# Patient Record
Sex: Female | Born: 1955 | Race: White | Hispanic: No | Marital: Married | Smoking: Never smoker
Health system: Southern US, Community
[De-identification: ages and names within clinical notes are randomized; demographics above are authoritative.]

## PROBLEM LIST (undated history)

## (undated) DIAGNOSIS — M1990 Unspecified osteoarthritis, unspecified site: Secondary | ICD-10-CM

## (undated) DIAGNOSIS — I447 Left bundle-branch block, unspecified: Secondary | ICD-10-CM

## (undated) DIAGNOSIS — R011 Cardiac murmur, unspecified: Secondary | ICD-10-CM

## (undated) DIAGNOSIS — E119 Type 2 diabetes mellitus without complications: Secondary | ICD-10-CM

## (undated) DIAGNOSIS — I10 Essential (primary) hypertension: Secondary | ICD-10-CM

## (undated) DIAGNOSIS — E785 Hyperlipidemia, unspecified: Secondary | ICD-10-CM

## (undated) DIAGNOSIS — I519 Heart disease, unspecified: Secondary | ICD-10-CM

## (undated) HISTORY — DX: Type 2 diabetes mellitus without complications: E11.9

## (undated) HISTORY — PX: MIDDLE EAR SURGERY: SHX713

## (undated) HISTORY — DX: Hyperlipidemia, unspecified: E78.5

## (undated) HISTORY — DX: Essential (primary) hypertension: I10

## (undated) HISTORY — DX: Heart disease, unspecified: I51.9

## (undated) HISTORY — DX: Unspecified osteoarthritis, unspecified site: M19.90

## (undated) HISTORY — DX: Cardiac murmur, unspecified: R01.1

---
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
## 1998-12-01 ENCOUNTER — Other Ambulatory Visit: Admission: RE | Admit: 1998-12-01 | Discharge: 1998-12-01 | Payer: Self-pay | Admitting: Gynecology

## 2000-11-12 ENCOUNTER — Other Ambulatory Visit: Admission: RE | Admit: 2000-11-12 | Discharge: 2000-11-12 | Payer: Self-pay | Admitting: Gynecology

## 2004-06-21 ENCOUNTER — Other Ambulatory Visit: Admission: RE | Admit: 2004-06-21 | Discharge: 2004-06-21 | Payer: Self-pay | Admitting: Gynecology

## 2005-08-11 ENCOUNTER — Inpatient Hospital Stay (HOSPITAL_COMMUNITY): Admission: EM | Admit: 2005-08-11 | Discharge: 2005-08-15 | Payer: Self-pay | Admitting: Emergency Medicine

## 2005-08-25 ENCOUNTER — Emergency Department (HOSPITAL_COMMUNITY): Admission: EM | Admit: 2005-08-25 | Discharge: 2005-08-25 | Payer: Self-pay | Admitting: Emergency Medicine

## 2008-10-13 ENCOUNTER — Inpatient Hospital Stay (HOSPITAL_COMMUNITY): Admission: EM | Admit: 2008-10-13 | Discharge: 2008-10-14 | Payer: Self-pay | Admitting: Emergency Medicine

## 2008-10-28 ENCOUNTER — Encounter: Payer: Self-pay | Admitting: Cardiovascular Disease

## 2009-08-24 ENCOUNTER — Inpatient Hospital Stay (HOSPITAL_COMMUNITY): Admission: EM | Admit: 2009-08-24 | Discharge: 2009-08-26 | Payer: Self-pay | Admitting: Emergency Medicine

## 2009-09-13 ENCOUNTER — Ambulatory Visit (HOSPITAL_COMMUNITY): Admission: RE | Admit: 2009-09-13 | Discharge: 2009-09-13 | Payer: Self-pay | Admitting: Internal Medicine

## 2009-09-13 ENCOUNTER — Ambulatory Visit: Payer: Self-pay | Admitting: Family Medicine

## 2009-11-27 DIAGNOSIS — I447 Left bundle-branch block, unspecified: Secondary | ICD-10-CM

## 2009-11-27 DIAGNOSIS — R011 Cardiac murmur, unspecified: Secondary | ICD-10-CM

## 2009-11-27 HISTORY — DX: Left bundle-branch block, unspecified: I44.7

## 2009-11-27 HISTORY — DX: Cardiac murmur, unspecified: R01.1

## 2010-05-11 ENCOUNTER — Inpatient Hospital Stay (HOSPITAL_COMMUNITY): Admission: EM | Admit: 2010-05-11 | Discharge: 2010-05-14 | Payer: Self-pay | Admitting: Emergency Medicine

## 2010-05-27 ENCOUNTER — Ambulatory Visit: Payer: Self-pay | Admitting: Family Medicine

## 2010-05-31 ENCOUNTER — Ambulatory Visit (HOSPITAL_COMMUNITY): Admission: RE | Admit: 2010-05-31 | Discharge: 2010-05-31 | Payer: Self-pay | Admitting: Internal Medicine

## 2010-06-02 ENCOUNTER — Encounter (INDEPENDENT_AMBULATORY_CARE_PROVIDER_SITE_OTHER): Payer: Self-pay | Admitting: Adult Health

## 2010-06-02 ENCOUNTER — Ambulatory Visit: Payer: Self-pay | Admitting: Internal Medicine

## 2010-06-02 LAB — CONVERTED CEMR LAB
ALT: 46 U/L — ABNORMAL HIGH
AST: 25 U/L
Albumin: 4.3 g/dL
Alkaline Phosphatase: 54 U/L
BUN: 24 mg/dL — ABNORMAL HIGH
Basophils Absolute: 0 K/uL
Basophils Relative: 0 %
CO2: 25 meq/L
Calcium: 9.7 mg/dL
Chloride: 100 meq/L
Cholesterol: 137 mg/dL
Creatinine, Ser: 0.86 mg/dL
Eosinophils Absolute: 0.3 K/uL
Eosinophils Relative: 3 %
Glucose, Bld: 186 mg/dL — ABNORMAL HIGH
HCT: 43.2 %
HDL: 54 mg/dL
Hemoglobin: 13.8 g/dL
LDL Cholesterol: 47 mg/dL
Lymphocytes Relative: 26 %
Lymphs Abs: 2.2 K/uL
MCHC: 31.9 g/dL
MCV: 89.4 fL
Microalb, Ur: 6.45 mg/dL — ABNORMAL HIGH
Monocytes Absolute: 0.6 K/uL
Monocytes Relative: 7 %
Neutro Abs: 5.5 K/uL
Neutrophils Relative %: 65 %
Platelets: 258 K/uL
Potassium: 4.6 meq/L
RBC: 4.83 M/uL
RDW: 15.2 %
Sodium: 141 meq/L
TSH: 2.054 u[IU]/mL
Total Bilirubin: 0.6 mg/dL
Total CHOL/HDL Ratio: 2.5
Total Protein: 7.2 g/dL
Triglycerides: 182 mg/dL — ABNORMAL HIGH
VLDL: 36 mg/dL
Vit D, 25-Hydroxy: 40 ng/mL
WBC: 8.5 10*3/microliter

## 2010-06-10 ENCOUNTER — Ambulatory Visit (HOSPITAL_COMMUNITY): Admission: RE | Admit: 2010-06-10 | Discharge: 2010-06-10 | Payer: Self-pay | Admitting: Internal Medicine

## 2010-06-13 ENCOUNTER — Ambulatory Visit: Payer: Self-pay | Admitting: Internal Medicine

## 2010-06-13 LAB — CONVERTED CEMR LAB: Pap Smear: NEGATIVE

## 2010-06-15 ENCOUNTER — Ambulatory Visit (HOSPITAL_COMMUNITY): Admission: RE | Admit: 2010-06-15 | Discharge: 2010-06-15 | Payer: Self-pay | Admitting: Internal Medicine

## 2010-06-23 ENCOUNTER — Ambulatory Visit (HOSPITAL_COMMUNITY): Admission: RE | Admit: 2010-06-23 | Discharge: 2010-06-23 | Payer: Self-pay | Admitting: Cardiology

## 2010-06-23 ENCOUNTER — Encounter (INDEPENDENT_AMBULATORY_CARE_PROVIDER_SITE_OTHER): Payer: Self-pay | Admitting: Cardiology

## 2010-06-28 ENCOUNTER — Ambulatory Visit: Payer: Self-pay | Admitting: Internal Medicine

## 2010-06-29 ENCOUNTER — Ambulatory Visit: Payer: Self-pay | Admitting: Internal Medicine

## 2010-08-04 ENCOUNTER — Other Ambulatory Visit: Admission: RE | Admit: 2010-08-04 | Discharge: 2010-08-04 | Payer: Self-pay | Admitting: Obstetrics & Gynecology

## 2010-08-04 ENCOUNTER — Ambulatory Visit: Payer: Self-pay | Admitting: Obstetrics & Gynecology

## 2010-08-18 ENCOUNTER — Ambulatory Visit: Payer: Self-pay | Admitting: Obstetrics & Gynecology

## 2010-10-31 ENCOUNTER — Encounter (HOSPITAL_COMMUNITY)
Admission: RE | Admit: 2010-10-31 | Discharge: 2010-12-27 | Payer: Self-pay | Source: Home / Self Care | Attending: Cardiology | Admitting: Cardiology

## 2010-12-17 ENCOUNTER — Encounter: Payer: Self-pay | Admitting: Cardiology

## 2010-12-18 ENCOUNTER — Encounter: Payer: Self-pay | Admitting: Cardiology

## 2011-02-12 LAB — GLUCOSE, CAPILLARY
Glucose-Capillary: 163 mg/dL — ABNORMAL HIGH (ref 70–99)
Glucose-Capillary: 177 mg/dL — ABNORMAL HIGH (ref 70–99)
Glucose-Capillary: 196 mg/dL — ABNORMAL HIGH (ref 70–99)
Glucose-Capillary: 203 mg/dL — ABNORMAL HIGH (ref 70–99)
Glucose-Capillary: 213 mg/dL — ABNORMAL HIGH (ref 70–99)
Glucose-Capillary: 213 mg/dL — ABNORMAL HIGH (ref 70–99)
Glucose-Capillary: 222 mg/dL — ABNORMAL HIGH (ref 70–99)
Glucose-Capillary: 226 mg/dL — ABNORMAL HIGH (ref 70–99)
Glucose-Capillary: 259 mg/dL — ABNORMAL HIGH (ref 70–99)
Glucose-Capillary: 277 mg/dL — ABNORMAL HIGH (ref 70–99)
Glucose-Capillary: 300 mg/dL — ABNORMAL HIGH (ref 70–99)
Glucose-Capillary: 306 mg/dL — ABNORMAL HIGH (ref 70–99)
Glucose-Capillary: 326 mg/dL — ABNORMAL HIGH (ref 70–99)

## 2011-02-12 LAB — BASIC METABOLIC PANEL
BUN: 17 mg/dL (ref 6–23)
CO2: 32 mEq/L (ref 19–32)
Calcium: 8.8 mg/dL (ref 8.4–10.5)
Chloride: 99 mEq/L (ref 96–112)
Creatinine, Ser: 0.75 mg/dL (ref 0.4–1.2)
GFR calc Af Amer: >60 mL/min (ref >60)
GFR calc non Af Amer: >60 mL/min (ref >60)
Glucose, Bld: 317 mg/dL — ABNORMAL HIGH (ref 70–99)
Potassium: 4.1 mEq/L (ref 3.5–5.1)
Sodium: 137 mEq/L (ref 135–145)

## 2011-02-12 LAB — HEMOGLOBIN A1C
Hgb A1c MFr Bld: 7.9 % — ABNORMAL HIGH (ref <5.7)
Mean Plasma Glucose: 180 mg/dL — ABNORMAL HIGH (ref <117)

## 2011-02-12 LAB — CARDIAC PANEL(CRET KIN+CKTOT+MB+TROPI)
CK, MB: 3.2 ng/mL (ref 0.3–4.0)
CK, MB: 3.5 ng/mL (ref 0.3–4.0)
Relative Index: 1 (ref 0.0–2.5)
Relative Index: 2 (ref 0.0–2.5)
Total CK: 171 U/L (ref 7–177)
Total CK: 336 U/L — ABNORMAL HIGH (ref 7–177)
Troponin I: 0.01 ng/mL (ref 0.00–0.06)
Troponin I: 0.01 ng/mL (ref 0.00–0.06)

## 2011-02-12 LAB — TROPONIN I: Troponin I: 0.01 ng/mL (ref 0.00–0.06)

## 2011-02-12 LAB — MAGNESIUM: Magnesium: 2.1 mg/dL (ref 1.5–2.5)

## 2011-02-12 LAB — CK TOTAL AND CKMB (NOT AT ARMC)
CK, MB: 2.5 ng/mL (ref 0.3–4.0)
Relative Index: 1.3 (ref 0.0–2.5)
Total CK: 193 U/L — ABNORMAL HIGH (ref 7–177)

## 2011-02-12 LAB — PHOSPHORUS: Phosphorus: 3.2 mg/dL (ref 2.3–4.6)

## 2011-02-13 LAB — POCT CARDIAC MARKERS
CKMB, poc: 1.2 ng/mL (ref 1.0–8.0)
CKMB, poc: 2.2 ng/mL (ref 1.0–8.0)
Myoglobin, poc: 134 ng/mL (ref 12–200)
Myoglobin, poc: 195 ng/mL (ref 12–200)
Troponin i, poc: <0.05 ng/mL (ref 0.00–0.09)
Troponin i, poc: <0.05 ng/mL (ref 0.00–0.09)

## 2011-02-13 LAB — CULTURE, BLOOD (ROUTINE X 2)
Culture: NO GROWTH
Culture: NO GROWTH

## 2011-02-13 LAB — PROTIME-INR
INR: 1.12 (ref 0.00–1.49)
Prothrombin Time: 14.3 seconds (ref 11.6–15.2)

## 2011-02-13 LAB — POCT I-STAT 3, ART BLOOD GAS (G3+)
Acid-Base Excess: 6 mmol/L — ABNORMAL HIGH (ref 0.0–2.0)
Bicarbonate: 31.7 mEq/L — ABNORMAL HIGH (ref 20.0–24.0)
O2 Saturation: 93 %
TCO2: 33 mmol/L (ref 0–100)
pCO2 arterial: 48.1 mmHg — ABNORMAL HIGH (ref 35.0–45.0)
pH, Arterial: 7.427 — ABNORMAL HIGH (ref 7.350–7.400)
pO2, Arterial: 65 mmHg — ABNORMAL LOW (ref 80.0–100.0)

## 2011-02-13 LAB — CBC
HCT: 40.8 % (ref 36.0–46.0)
Hemoglobin: 13.7 g/dL (ref 12.0–15.0)
MCHC: 33.5 g/dL (ref 30.0–36.0)
MCV: 88.2 fL (ref 78.0–100.0)
Platelets: 202 10*3/uL (ref 150–400)
RBC: 4.63 MIL/uL (ref 3.87–5.11)
RDW: 14.4 % (ref 11.5–15.5)
WBC: 8.3 10*3/uL (ref 4.0–10.5)

## 2011-02-13 LAB — DIFFERENTIAL
Basophils Absolute: 0 10*3/uL (ref 0.0–0.1)
Basophils Relative: 0 % (ref 0–1)
Eosinophils Absolute: 0.2 10*3/uL (ref 0.0–0.7)
Eosinophils Relative: 2 % (ref 0–5)
Lymphocytes Relative: 20 % (ref 12–46)
Lymphs Abs: 1.7 10*3/uL (ref 0.7–4.0)
Monocytes Absolute: 0.9 10*3/uL (ref 0.1–1.0)
Monocytes Relative: 11 % (ref 3–12)
Neutro Abs: 5.5 10*3/uL (ref 1.7–7.7)
Neutrophils Relative %: 67 % (ref 43–77)

## 2011-02-13 LAB — D-DIMER, QUANTITATIVE: D-Dimer, Quant: 0.34 ug/mL-FEU (ref 0.00–0.48)

## 2011-02-13 LAB — APTT: aPTT: 27 seconds (ref 24–37)

## 2011-02-13 LAB — BRAIN NATRIURETIC PEPTIDE: Pro B Natriuretic peptide (BNP): 63 pg/mL (ref 0.0–100.0)

## 2011-03-03 LAB — CULTURE, BLOOD (ROUTINE X 2)
Culture: NO GROWTH
Culture: NO GROWTH

## 2011-03-03 LAB — COMPREHENSIVE METABOLIC PANEL
ALT: 26 U/L (ref 0–35)
ALT: 33 U/L (ref 0–35)
AST: 18 U/L (ref 0–37)
AST: 19 U/L (ref 0–37)
Albumin: 3 g/dL — ABNORMAL LOW (ref 3.5–5.2)
Albumin: 3.5 g/dL (ref 3.5–5.2)
Alkaline Phosphatase: 65 U/L (ref 39–117)
Alkaline Phosphatase: 72 U/L (ref 39–117)
BUN: 11 mg/dL (ref 6–23)
BUN: 9 mg/dL (ref 6–23)
CO2: 29 mEq/L (ref 19–32)
CO2: 32 mEq/L (ref 19–32)
Calcium: 8.2 mg/dL — ABNORMAL LOW (ref 8.4–10.5)
Calcium: 9.2 mg/dL (ref 8.4–10.5)
Chloride: 97 mEq/L (ref 96–112)
Chloride: 98 mEq/L (ref 96–112)
Creatinine, Ser: 0.73 mg/dL (ref 0.4–1.2)
Creatinine, Ser: 0.81 mg/dL (ref 0.4–1.2)
GFR calc Af Amer: >60 mL/min (ref >60)
GFR calc Af Amer: >60 mL/min (ref >60)
GFR calc non Af Amer: >60 mL/min (ref >60)
GFR calc non Af Amer: >60 mL/min (ref >60)
Glucose, Bld: 129 mg/dL — ABNORMAL HIGH (ref 70–99)
Glucose, Bld: 131 mg/dL — ABNORMAL HIGH (ref 70–99)
Potassium: 3.5 mEq/L (ref 3.5–5.1)
Potassium: 4.1 mEq/L (ref 3.5–5.1)
Sodium: 134 mEq/L — ABNORMAL LOW (ref 135–145)
Sodium: 138 mEq/L (ref 135–145)
Total Bilirubin: 0.5 mg/dL (ref 0.3–1.2)
Total Bilirubin: 0.5 mg/dL (ref 0.3–1.2)
Total Protein: 6.2 g/dL (ref 6.0–8.3)
Total Protein: 7.3 g/dL (ref 6.0–8.3)

## 2011-03-03 LAB — CBC
HCT: 36.6 % (ref 36.0–46.0)
HCT: 39.8 % (ref 36.0–46.0)
Hemoglobin: 12.4 g/dL (ref 12.0–15.0)
Hemoglobin: 13.2 g/dL (ref 12.0–15.0)
MCHC: 33.2 g/dL (ref 30.0–36.0)
MCHC: 33.9 g/dL (ref 30.0–36.0)
MCV: 89.3 fL (ref 78.0–100.0)
MCV: 89.6 fL (ref 78.0–100.0)
Platelets: 216 10*3/uL (ref 150–400)
Platelets: 236 10*3/uL (ref 150–400)
RBC: 4.09 MIL/uL (ref 3.87–5.11)
RBC: 4.45 MIL/uL (ref 3.87–5.11)
RDW: 14 % (ref 11.5–15.5)
RDW: 14.3 % (ref 11.5–15.5)
WBC: 8 10*3/uL (ref 4.0–10.5)
WBC: 8.3 10*3/uL (ref 4.0–10.5)

## 2011-03-03 LAB — GLUCOSE, CAPILLARY
Glucose-Capillary: 107 mg/dL — ABNORMAL HIGH (ref 70–99)
Glucose-Capillary: 123 mg/dL — ABNORMAL HIGH (ref 70–99)
Glucose-Capillary: 124 mg/dL — ABNORMAL HIGH (ref 70–99)
Glucose-Capillary: 129 mg/dL — ABNORMAL HIGH (ref 70–99)
Glucose-Capillary: 142 mg/dL — ABNORMAL HIGH (ref 70–99)
Glucose-Capillary: 94 mg/dL (ref 70–99)

## 2011-03-03 LAB — URINE CULTURE: Colony Count: 50000

## 2011-03-03 LAB — URINALYSIS, ROUTINE W REFLEX MICROSCOPIC
Bilirubin Urine: NEGATIVE
Bilirubin Urine: NEGATIVE
Glucose, UA: NEGATIVE mg/dL
Glucose, UA: NEGATIVE mg/dL
Hgb urine dipstick: NEGATIVE
Hgb urine dipstick: NEGATIVE
Ketones, ur: NEGATIVE mg/dL
Ketones, ur: NEGATIVE mg/dL
Nitrite: NEGATIVE
Nitrite: NEGATIVE
Protein, ur: NEGATIVE mg/dL
Protein, ur: NEGATIVE mg/dL
Specific Gravity, Urine: 1.017 (ref 1.005–1.030)
Specific Gravity, Urine: 1.023 (ref 1.005–1.030)
Urobilinogen, UA: 0.2 mg/dL (ref 0.0–1.0)
Urobilinogen, UA: 0.2 mg/dL (ref 0.0–1.0)
pH: 6 (ref 5.0–8.0)
pH: 8 (ref 5.0–8.0)

## 2011-03-03 LAB — DIFFERENTIAL
Basophils Absolute: 0 10*3/uL (ref 0.0–0.1)
Basophils Relative: 1 % (ref 0–1)
Eosinophils Absolute: 0.3 10*3/uL (ref 0.0–0.7)
Eosinophils Relative: 4 % (ref 0–5)
Lymphocytes Relative: 33 % (ref 12–46)
Lymphs Abs: 2.7 10*3/uL (ref 0.7–4.0)
Monocytes Absolute: 0.6 10*3/uL (ref 0.1–1.0)
Monocytes Relative: 8 % (ref 3–12)
Neutro Abs: 4.6 10*3/uL (ref 1.7–7.7)
Neutrophils Relative %: 56 % (ref 43–77)

## 2011-03-03 LAB — URINE MICROSCOPIC-ADD ON

## 2011-03-03 LAB — LIPASE, BLOOD: Lipase: 20 U/L (ref 11–59)

## 2011-04-11 NOTE — Cardiovascular Report (Signed)
Samantha [name], [name]          ACCOUNT NO.:  [id number]   MEDICAL RECORD NO.:  [id number]          PATIENT TYPE:  INP   LOCATION:  4705                         FACILITY:  [facility]   PHYSICIAN:  Vesta Mixer, M.D. DATE OF BIRTH:  [birth year]   DATE OF PROCEDURE:  10/13/2008  DATE OF DISCHARGE:                            CARDIAC CATHETERIZATION   Samantha [name] is a 55 year old female with a history of  hypertension, hyperlipidemia, and coronary thrombosis.  She is admitted  with a chest pain.  Because of her persistent chest pain and mild EKG  changes, we brought her up to the cath lab.  The procedure was left  heart catheterization with coronary angiography.   HEMODYNAMICS:  LV pressure is 129/16 with an aortic pressure of 123/67.   ANGIOGRAPHY:  Left main:  The left main is fairly smooth and normal.   The left anterior descending artery is smooth and normal.  The diagonal  arteries are normal.   The left circumflex artery is large and dominant.  It is smooth and  normal throughout its course.  There is no evidence of thrombus as it  was 2 years ago.   The posterior descending artery is normal as are the obtuse marginal  arteries.   The right coronary artery is small and is nondominant.  It is smooth and  normal throughout its course.   The left ventriculogram was performed in a 30 RAO position.  It reveals  normal left ventricular systolic function.  The ejection fraction is  65%.   COMPLICATIONS:  None.   CONCLUSION:  Smooth and normal coronary arteries.  We will admit her for  observation and anticipate discharge tomorrow.      Vesta Mixer, M.D.  Electronically Signed     PJN/MEDQ  D:  10/13/2008  T:  10/14/2008  Job:  [id number]   cc:   Leonette Most Record

## 2011-04-11 NOTE — Discharge Summary (Signed)
NAMESHALITA, Samantha [name]          ACCOUNT NO.:  [id number]   MEDICAL RECORD NO.:  [id number]          PATIENT TYPE:  INP   LOCATION:  4705                         FACILITY:  [facility]   PHYSICIAN:  Vesta Mixer, M.D. DATE OF BIRTH:  [birth year]   DATE OF ADMISSION:  10/13/2008  DATE OF DISCHARGE:  10/14/2008                               DISCHARGE SUMMARY   DISCHARGE DIAGNOSES:  1. Noncardiac chest pain.  2. Hypertension.  3. Hyperlipidemia.  4. Diabetes mellitus.  5. Anxiety.   DISCHARGE MEDICATIONS:  1. Nitroglycerin 0.4 mg sublingually as needed.  2. Verapamil 240 mg slow release once a day.  3. Aspirin 81 mg a day.  4. Diclofenac 75 mg twice a day.  5. HCTZ 12.5 mg a day.  6. Januvia 100 mg a day.  7. Fluoxetine 20 mg a day.  8. Lisinopril 10 mg a day.  9. Lovastatin 20 mg a day.  10.Calcium.  11.Vitamin with vitamin D once a day.   DISPOSITION:  The patient will see Dr. Elease Hashimoto in 1 month.  She is to see  Dr. record as needed.  We will encourage her to take her diclofenac  twice a day as a scheduled dose for the next week or so.   History is coccal is a 55 year old female who is admitted through the  emergency room with episode of chest pain.  Please see H and P for  further details.   HOSPITAL COURSE:  1. Chest pain.  The patient ruled out for myocardial infarction.  She      did have mildly elevated CPK levels with very minimal CPK-MB.  Her      troponin, however, remained completely normal.  I suspect that this      was due to musculoskeletal call us.  She this chest pain was      associated with nausea and indigestion.  We took urgently to the cath lab and she was found to have smooth and  normal coronary arteries.  She had normal left ventricular systolic  function.  She will be discharged on the above-noted medications and  disposition.  She is to call us for any further episodes of pain.  We  have added nitroglycerin to a medical assistant to cover for  coronary  spasm or esophageal spasm.  1. Diabetes mellitus - stable.  Her glucose levels were low elevated.      This will be followed by Dr. Record.  2. Hypertension - stable.  3. Hyperlipidemia - stable.  We will check her lipids as an      outpatient.      Vesta Mixer, M.D.  Electronically Signed     PJN/MEDQ  D:  10/14/2008  T:  10/14/2008  Job:  [id number]   cc:   Leonette Most Record

## 2011-04-14 NOTE — H&P (Signed)
Samantha [name], [name]          ACCOUNT NO.:  [id number]   MEDICAL RECORD NO.:  [id number]          PATIENT TYPE:  INP   LOCATION:  2902                         FACILITY:  [facility]   PHYSICIAN:  Vesta Mixer, M.D. DATE OF BIRTH:  07-30-1956   DATE OF ADMISSION:  08/11/2005  DATE OF DISCHARGE:                                HISTORY & PHYSICAL   HISTORY OF PRESENT ILLNESS:  [name] is a 55 year old female with a  history of hypertension.  She was admitted to the hospital with episodes of  chest pain, shortness of breath, and positive cardiac enzymes.   [name] has a long history of hypertension.  She has overall done  fairly well.  This morning, she awoke with severe chest discomfort and  dyspnea.  The pain was described as a heaviness and gradually lead to  increased dyspnea.  She also had some pain in her left hand.  The symptoms  did not subside and she presented to the emergency room for further  evaluation.   Now, her symptoms are better now that she is on nitroglycerin and heparin.  She denies any previous episode of cardiac problems.  She is admitted for  further management.   CURRENT MEDICATIONS:  1.  Aspirin one daily.  2.  Lisinopril 10 mg daily.  3.  Verapamil SA 240 mg daily.  4.  HCTZ 12.5 mg daily.  5.  Effexor XR 75 mg daily.   ALLERGIES:  No known drug allergies.   PAST MEDICAL HISTORY:  1.  Hypertension.  2.  Depression.   SOCIAL HISTORY:  The patient is a nonsmoker and a nondrinker.  She works at  [employer].   FAMILY HISTORY:  Her mother died of congestive heart failure and her brother  died of congestive heart failure.   REVIEW OF SYSTEMS:  Reviewed and is essentially negative.   PHYSICAL EXAMINATION:  GENERAL:  She is a middle-aged female in no acute  distress.  She is alert and oriented x3, and her mood and affect are normal.  VITAL SIGNS:  Her blood pressure is 149/66.  Temperature is 99.1, her heart  rate is 70.  HEENT:  2+  carotid's.  She has no JVD, no thyromegaly.  LUNGS:  Bilateral tight wheezing.  HEART:  Regular rate, S1 and S2.  I did not hear any murmurs.  ABDOMEN:  Good bowel sounds and is nontender.  EXTREMITIES:  She has no edema.  NEUROLOGIC:  Nonfocal.  Cranial nerves II-XII intact.   LABORATORY DATA:  Her chest x-ray reveals mild cardiomegaly.  There is no  pulmonary edema.  Her BNP is 51.  Her white blood cell count is 12.3,  hemoglobin 13.8.  Her Chem-7 is within normal limits except for a potassium  of 3.6.   Her CPK-MB initially was 10.7.  It has come down slightly to 6.6.  Her  troponin level is less than 0.05.   Her EKG could not be located at the time of dictation.  From the emergency  room, it reportedly revealed no acute changes.  We will have them repeat the  EKG.  IMPRESSION AND PLAN:  1.  Chest pain and dyspnea.  The patient's symptoms are certainly concerning      for angina.  She has positive CPK-MB's.  Her troponin's remain negative.      We will continue with nitroglycerin and heparin.  We will repeat the EKG      if we cannot find it tonight and we will get another one tomorrow.  She      may need a heart catheterization on Monday if her enzymes are worrisome.  2.  She definitely has wheezing.  It is possible that all of this could be      due to bronchospasm and an episode of angina.  She does not have any      congestive heart failure by chest x-ray.  We will give her Xopenex as      well as Solu-Medrol.  3.  Hypertension.  Stable.           ______________________________  Vesta Mixer, M.D.     PJN/MEDQ  D:  08/11/2005  T:  08/12/2005  Job:  [id number]   cc:   Leonette Most Record  [address]  [place]  [place] [address]  Fax: [phone]

## 2011-04-14 NOTE — Discharge Summary (Signed)
Samantha [name], DANNENBERG          ACCOUNT NO.:  [id number]   MEDICAL RECORD NO.:  [id number]          PATIENT TYPE:  INP   LOCATION:  3739                         FACILITY:  [facility]   PHYSICIAN:  Vesta Mixer, M.D. DATE OF BIRTH:  [birth year]   DATE OF ADMISSION:  08/11/2005  DATE OF DISCHARGE:  08/15/2005                                 DISCHARGE SUMMARY   DISCHARGE DIAGNOSES:  1.  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  2.  Asthma - possibly caused by allergies.  3.  Hypertension.  4.  Anxiety.   DISCHARGE MEDICATIONS:  1.  Enteric-coated aspirin 81 mg a day.  2.  Plavix 75 mg a day.  3.  Medrol Dosepak to take as directed.  4.  Albuterol inhaler two puffs every six hours.  5.  Prinivil 10 mg a day.  6.  HCTZ 12.5 mg a day.  7.  Effexor 75 mg a day.  8.  Verapamil 240 mg a day.   DISPOSITION:  The patient will see Dr. Elease Hashimoto in one to two weeks. She is to  follow up and see Dr. Record in several weeks as well for her asthma.   HISTORY:  Samantha [name] is a 55 year old female who was admitted to [facility] with episodes of chest pain. Please see dictated H&P for further  details.   HOSPITAL COURSE BY PROBLEMS:  Problem 1.  Chest pain. The patient ruled in  for a small myocardial infarction. She stabilized on IV heparin. On  August 14, 2005, we took her to the cath lab and she was found to have a  large thrombus in her left circumflex artery. We started Integrilin, heparin  and Plavix in the cath lab.  The thrombus resolved over the next 10 minutes.  She was continued on Integrilin overnight and feels quite well this morning.  She has not had any further episodes of chest pain or chest tightness.   We will continue the aspirin and Plavix long-term. We will follow up with a  fasting cholesterol level in the office. She is stable for discharge.   Problem 2.  Wheezing. The patient had significant bronchospasm  and wheezing  early in her hospitalization.  It is quite possible that this was due to  transient congestive heart failure because of the subendocardial infarction  but the symptoms continued even after. We had verified that her LV function  was normal. It is quite possible that she also has some degree of allergies.  We have treated her with albuterol and will give her a Medrol Dosepak. She  will follow up with Dr. Record for further management of this problem.   Problem 3.  Hypertension - stable.   Problem 4.  Anxiety. We will continue with the Effexor. She will follow up  with Dr. Record.           ______________________________  Vesta Mixer, M.D.     PJN/MEDQ  D:  08/15/2005  T:  08/15/2005  Job:  [id number]   cc:   Leonette Most Record  [address]  [place]  [place] [address]  Fax: [phone]

## 2011-04-14 NOTE — Cardiovascular Report (Signed)
NAMEVINIE, CHARITY NO.:  [id number]   MEDICAL RECORD NO.:  [id number]          PATIENT TYPE:  INP   LOCATION:  2902                         FACILITY:  [facility]   PHYSICIAN:  Vesta Mixer, M.D. DATE OF BIRTH:  1956-08-27   DATE OF PROCEDURE:  08/14/2005  DATE OF DISCHARGE:                              CARDIAC CATHETERIZATION   [name] is a 55 year old female with a history of hypertension. She  was admitted on August 11, 2005, with chest pains and positive cardiac  enzymes. She was admitted over the weekend and was kept on heparin. She was  referred for heart catheterization this morning.   PROCEDURE:  Left heart catheterization with coronary angiography. The  patient had Integrilin started during the procedure.   The right femoral artery was easily cannulated using modified Seldinger  technique.   HEMODYNAMICS:  LV pressure was 153/25 with an aortic pressure of 153/79.   The left main was initially engaged with a Judkins left #4. This was more  directed toward the circumflex artery so the catheter was traded out for a  left 3.5 guide.   Initial angiography reveals a very short left main. Essentially there are  two separate ostia for the left circumflex artery and the LAD.   The left circumflex artery was visualized first. The flow down the  circumflex artery was quite sluggish and there was a large thrombus in the  proximal aspect of the circ. This was confirmed in multiple views. After the  dye ejection, the patient started having some chest pain, chest flushing and  some hypotension. Her IV nitroglycerin  was turned off. She received 0.5  milligram of atropine. Her IV fluids were turned up. She also complained of  some flushing and we presumed that there may be some allergy to the  contrast. She received IV Pepcid 20 mg and 125 mg of Solu-Medrol. Because of  the thrombus, she also received a double bolus Integrilin drip. We monitored  her  for approximately 10 minutes and she felt better. We then tried the  Judkins left #4 catheter out for a Judkins left 3.5. Follow-up angiography  revealed complete resolution of this large filling defect in the left  circumflex artery and also revealed normal flow down the circumflex artery.  Angiography of the circumflex at this point revealed a large and dominant  circumflex artery. The first obtuse marginal artery is normal. The  posterolateral segment artery is normal and the posterior descending artery  is normal.   The left anterior descending artery is fairly smooth and normal. There are  two diagonal vessels which are fairly smooth and normal.   The right coronary artery is small and nondominant. It is unremarkable.   The left ventriculogram was performed in the 30 RAO position. The EKG  reveals normal left ventricular systolic function. There is no mitral  regurgitation.   COMPLICATIONS:  None.   CONCLUSION:  Evidence of a large filling defect in the left circumflex  artery. This is somewhat surprising in that she has relatively smooth and  normal coronary arteries. She has normal left ventricular systolic function.  PLAN:  Continue her on the Integrilin for 24 hours. We will start her on  aspirin and Plavix and will anticipate discharge tomorrow on aspirin and  Plavix.           ______________________________  Vesta Mixer, M.D.     PJN/MEDQ  D:  08/14/2005  T:  08/14/2005  Job:  [id number]   cc:   [facility] Record  [address]  [place]  [place] [address]  Fax: [phone]

## 2011-08-29 LAB — POCT I-STAT, CHEM 8
BUN: 22
Calcium, Ion: 1.2
Chloride: 101
Creatinine, Ser: 0.9
Glucose, Bld: 90
HCT: 41
Hemoglobin: 13.9
Potassium: 3.8
Sodium: 140
TCO2: 31

## 2011-08-29 LAB — BASIC METABOLIC PANEL
BUN: 17
CO2: 28
Calcium: 8.9
Chloride: 105
Creatinine, Ser: 0.76
GFR calc Af Amer: >60
GFR calc non Af Amer: >60
Glucose, Bld: 185 — ABNORMAL HIGH
Potassium: 4
Sodium: 138

## 2011-08-29 LAB — CBC
HCT: 39.3
HCT: 40.1
Hemoglobin: 13.2
Hemoglobin: 13.3
MCHC: 33.1
MCHC: 33.5
MCV: 88.9
MCV: 89
Platelets: 233
Platelets: 234
RBC: 4.41
RBC: 4.51
RDW: 13.9
RDW: 14.3
WBC: 10.3
WBC: 8.5

## 2011-08-29 LAB — CARDIAC PANEL(CRET KIN+CKTOT+MB+TROPI)
CK, MB: 5.7 — ABNORMAL HIGH
CK, MB: 6.7 — ABNORMAL HIGH
CK, MB: 9.8 — ABNORMAL HIGH
Relative Index: 2.8 — ABNORMAL HIGH
Relative Index: 2.9 — ABNORMAL HIGH
Relative Index: 3.3 — ABNORMAL HIGH
Total CK: 171
Total CK: 237 — ABNORMAL HIGH
Total CK: 340 — ABNORMAL HIGH
Troponin I: 0.01
Troponin I: 0.01
Troponin I: 0.01

## 2011-08-29 LAB — GLUCOSE, CAPILLARY
Glucose-Capillary: 145 — ABNORMAL HIGH
Glucose-Capillary: 182 — ABNORMAL HIGH
Glucose-Capillary: 190 — ABNORMAL HIGH
Glucose-Capillary: 211 — ABNORMAL HIGH

## 2011-08-29 LAB — DIFFERENTIAL
Basophils Absolute: 0.1
Basophils Relative: 1
Eosinophils Absolute: 0.2
Eosinophils Relative: 3
Lymphocytes Relative: 28
Lymphs Abs: 2.4
Monocytes Absolute: 0.7
Monocytes Relative: 8
Neutro Abs: 5.1
Neutrophils Relative %: 60

## 2011-08-29 LAB — POCT CARDIAC MARKERS
CKMB, poc: 8
Myoglobin, poc: 144
Troponin i, poc: <0.05

## 2012-05-09 ENCOUNTER — Other Ambulatory Visit (HOSPITAL_COMMUNITY): Payer: Self-pay | Admitting: Family Medicine

## 2012-05-09 DIAGNOSIS — Z1231 Encounter for screening mammogram for malignant neoplasm of breast: Secondary | ICD-10-CM

## 2012-05-31 ENCOUNTER — Ambulatory Visit (HOSPITAL_COMMUNITY): Payer: Self-pay

## 2012-06-05 ENCOUNTER — Ambulatory Visit (HOSPITAL_COMMUNITY): Payer: Self-pay

## 2012-08-20 ENCOUNTER — Other Ambulatory Visit: Payer: Self-pay | Admitting: Family Medicine

## 2012-08-20 ENCOUNTER — Other Ambulatory Visit (HOSPITAL_COMMUNITY): Payer: Self-pay | Admitting: Family Medicine

## 2012-08-20 DIAGNOSIS — Z1231 Encounter for screening mammogram for malignant neoplasm of breast: Secondary | ICD-10-CM

## 2012-09-02 ENCOUNTER — Ambulatory Visit (HOSPITAL_COMMUNITY)
Admission: RE | Admit: 2012-09-02 | Discharge: 2012-09-02 | Disposition: A | Discharge status: Home / Self Care | Payer: Managed Care, Other (non HMO) | Source: Ambulatory Visit | Attending: Family Medicine | Admitting: Family Medicine

## 2012-09-02 DIAGNOSIS — Z1231 Encounter for screening mammogram for malignant neoplasm of breast: Secondary | ICD-10-CM | POA: Insufficient documentation

## 2014-03-11 ENCOUNTER — Ambulatory Visit: Payer: Self-pay | Admitting: Otolaryngology

## 2014-03-25 ENCOUNTER — Ambulatory Visit: Payer: Self-pay | Admitting: Otolaryngology

## 2014-03-25 DIAGNOSIS — I10 Essential (primary) hypertension: Secondary | ICD-10-CM

## 2014-03-25 LAB — BASIC METABOLIC PANEL
Anion Gap: 6 — ABNORMAL LOW (ref 7–16)
BUN: 14 mg/dL (ref 7–18)
Calcium, Total: 9.3 mg/dL (ref 8.5–10.1)
Chloride: 100 mmol/L (ref 98–107)
Co2: 32 mmol/L (ref 21–32)
Creatinine: 0.67 mg/dL (ref 0.60–1.30)
EGFR (African American): >60
EGFR (Non-African Amer.): >60
Glucose: 127 mg/dL — ABNORMAL HIGH (ref 65–99)
Osmolality: 278 (ref 275–301)
Potassium: 4.1 mmol/L (ref 3.5–5.1)
Sodium: 138 mmol/L (ref 136–145)

## 2014-04-01 ENCOUNTER — Ambulatory Visit: Payer: Self-pay | Admitting: Otolaryngology

## 2014-04-03 LAB — PATHOLOGY REPORT

## 2014-05-14 ENCOUNTER — Encounter: Payer: Self-pay | Admitting: *Deleted

## 2014-09-07 ENCOUNTER — Other Ambulatory Visit (HOSPITAL_COMMUNITY): Payer: Self-pay | Admitting: Family Medicine

## 2014-09-07 DIAGNOSIS — Z1231 Encounter for screening mammogram for malignant neoplasm of breast: Secondary | ICD-10-CM

## 2014-09-09 ENCOUNTER — Ambulatory Visit (HOSPITAL_COMMUNITY)
Admission: RE | Admit: 2014-09-09 | Discharge: 2014-09-09 | Disposition: A | Discharge status: Home / Self Care | Payer: Managed Care, Other (non HMO) | Source: Ambulatory Visit | Attending: Family Medicine | Admitting: Family Medicine

## 2014-09-09 DIAGNOSIS — Z1231 Encounter for screening mammogram for malignant neoplasm of breast: Secondary | ICD-10-CM | POA: Diagnosis not present

## 2015-03-20 NOTE — Op Note (Signed)
PATIENT NAME:  Samantha [name], Samantha [name] MR#:  [id number] DATE OF BIRTH:  [birth year]  DATE OF PROCEDURE:  04/01/2014  PREOPERATIVE DIAGNOSIS:  Right suppurative otitis with cholesteatoma.   POSTOPERATIVE DIAGNOSIS: Right suppurative otitis with cholesteatoma.   PROCEDURE PERFORMED: Right tympanoplasty with atticotomy and removal of cholesteatoma.   SURGEON: Malon Kindle, M.D.   ANESTHESIA: General endotracheal.   INDICATIONS: The patient with drainage from the right ear with small cholesteatoma in the facial recess and attic region.   FINDINGS:  The majority of the cholesteatoma was slightly retracted into the facial recess but dissected out cleanly. It was scarred to the chorda tympani nerve, which could not be preserved,  but there was no further extension into the attic beyond the scarring on the chorda tympani. The ossicles were intact with mobility, although the stapes appeared a bit stiff.   COMPLICATIONS: None.   DESCRIPTION OF PROCEDURE: After obtaining informed consent, the patient was taken to the operating room and placed in the supine position. After induction of general endotracheal anesthesia, the patient was turned 90 degrees and the right ear injected with 1% lidocaine with epinephrine 1:100,000 in the canal and in the postauricular crease. The facial nerve monitor electrodes were placed in the usual fashion. The ear was then prepped and draped in the usual sterile fashion. The canal was inspected and incisions made at 12 o'clock and 6 o'clock  vertically, followed by a horizontal incision just behind the annulus. The posterior canal flap was then carefully  elevated with a weapon. Next, a 15 blade was used to make an incision just beyond the postauricular crease. The incision was carried down to the mastoid cortex, and the temporalis fascia identified superiorly. The temporalis fascia graft was harvested and set aside to be pressed and dried.  A Lempert was then used to elevate the  periosteum off the mastoid down into the canal. A Valora Corporal was used to elevate the posterior canal flap down to the previously made canal cuts. The tympanomeatal flap was then addressed, beginning to elevate it inferiorly, lifting the annulus with a Rosen needle. More superiorly, it was elevated, carefully looking for the chorda tympani nerve. The chorda tympani nerve was identified, but was very scarred to the retraction pocket, which contained granulation tissue. The retraction pocket appeared to extend slightly into the attic, but mostly into the facial recess. A canaloplasty was performed lowering the inferior aspect of the canal so that the annulus could be better visualized, and then lowering the tympanic ring to better visualize carefully with a #3 diamond bit to better visualize the facial recess. A very small bit of atticotomy was performed as well. The annulus was further elevated up to where the cholesteatoma was located, and this was carefully elevated out of the facial recess with a whirlybird and was completely removed.  An attempt was made to resect the cholesteatoma away from the chorda tympani nerve; however, the nerve was too scarred to cholesteatoma and tore during attempts at preserving it. The cholesteatoma was completely elevated off the middle ear, taking it off of the incudostapedial complex where there was some scar tissue located. It was lifted out of the middle ear and excised from the tympanic membrane remnant with Bellucci scissors. The middle ear was inspected. The ossicles were palpated. The malleus and incus appeared to move reasonably well, although the stapes was felt to have some  decreased mobility. The temporalis fascia graft was placed in position and the middle ear packed with Floxin-moistened Gelfoam. The  graft and tympanomeatal flap were placed in anatomic position and Gelfoam packed lateral to the reconstructed tympanic membrane. The posterior canal flap was everted and placed  in anatomic position, and the canal further packed with Floxin-moistened Gelfoam, followed by bacitracin ointment. The postauricular incision was closed with 4-0 Vicryl suture for the deep closure, followed by 5-0 fast absorbing gut suture in a running locked stitch for the skin. Bacitracin ointment was applied and then a Glasscock dressing was applied. The patient was then returned to the anesthesiologist for awakening. She was awakened and taken to the recovery room in good condition postoperatively. Blood loss was less than 25 mL. Facial nerve monitoring was used during the entire procedure for safety.     ____________________________ Sammuel Hines. Richardson Landry, MD psb:dmm D: 04/01/2014 12:38:49 ET T: 04/01/2014 22:09:41 ET JOB#: [id number]  cc: Sammuel Hines. Richardson Landry, MD, <Dictator> Riley Nearing MD ELECTRONICALLY SIGNED 04/03/2014 12:39

## 2015-11-20 ENCOUNTER — Ambulatory Visit (INDEPENDENT_AMBULATORY_CARE_PROVIDER_SITE_OTHER): Payer: Managed Care, Other (non HMO) | Admitting: Emergency Medicine

## 2015-11-20 VITALS — BP 128/62 | HR 84 | Temp 97.5°F | Resp 16 | Ht 69.5 in | Wt 241.8 lb

## 2015-11-20 DIAGNOSIS — E1169 Type 2 diabetes mellitus with other specified complication: Secondary | ICD-10-CM | POA: Insufficient documentation

## 2015-11-20 DIAGNOSIS — E119 Type 2 diabetes mellitus without complications: Secondary | ICD-10-CM | POA: Insufficient documentation

## 2015-11-20 DIAGNOSIS — J029 Acute pharyngitis, unspecified: Secondary | ICD-10-CM | POA: Diagnosis not present

## 2015-11-20 DIAGNOSIS — J0100 Acute maxillary sinusitis, unspecified: Secondary | ICD-10-CM | POA: Diagnosis not present

## 2015-11-20 DIAGNOSIS — H9201 Otalgia, right ear: Secondary | ICD-10-CM

## 2015-11-20 DIAGNOSIS — E1140 Type 2 diabetes mellitus with diabetic neuropathy, unspecified: Secondary | ICD-10-CM | POA: Insufficient documentation

## 2015-11-20 LAB — GLUCOSE, POCT (MANUAL RESULT ENTRY): POC Glucose: 155 mg/dl — AB (ref 70–99)

## 2015-11-20 LAB — POCT RAPID STREP A (OFFICE): Rapid Strep A Screen: NEGATIVE

## 2015-11-20 MED ORDER — AMOXICILLIN-POT CLAVULANATE 875-125 MG PO TABS: 1.0000 | ORAL_TABLET | Freq: Two times a day (BID) | ORAL | Status: DC

## 2015-11-20 NOTE — Progress Notes (Addendum)
Patient ID: Samantha [name], female   DOB: Jul 05, 1956, 59 y.o.   MRN: [id number]    By signing my name below, I, Essence Howell, attest that this documentation has been prepared under the direction and in the presence of Collene Gobble, MD Electronically Signed: Charline Bills, ED Scribe 11/20/2015 at 11:06 AM.  Chief Complaint:  Chief Complaint  Patient presents with  . Ear Pain    x 1 week  . Headache  . Nasal Congestion  . Fever    unspecified  . Cough    non productive   HPI: Samantha [name] is a 59 y.o. female, with a ho DM, who reports to [facility] today complaining of persistent right ear pain onset 1 week ago. Pt had tubes placed in both ears a few months ago; her ENT is Dr. Willeen Cass in [place], [place]. She reports associated fever last night that has resolved, dry cough, nasal congestion, sinus pressure, HA. Pt states that her blood glucose has been well controlled. She has tried Zinc without significant relief.   Past Medical History  Diagnosis Date  . Type II or unspecified type diabetes mellitus without mention of complication, not stated as uncontrolled   . Dyslipidemia   . Unspecified essential hypertension   . Arthritis   . Heart murmur 2011  . Nonspecific abnormal electrocardiogram (ECG) (EKG) 2011   Past Surgical History  Procedure Laterality Date  . Tonsillectomy  1962   Social History   Social History  . Marital Status: Married    Spouse Name: N/A  . Number of Children: N/A  . Years of Education: N/A   Social History Main Topics  . Smoking status: Never Smoker   . Smokeless tobacco: Never Used  . Alcohol Use: No  . Drug Use: No  . Sexual Activity: Not Asked   Other Topics Concern  . None   Social History Narrative   Family History  Problem Relation Age of Onset  . Hypertension Father   . Diabetes Father   . Diabetes Mother   . Hypertension Mother   . Hyperlipidemia Brother   . Hypertension Brother   . Diabetes Brother   . Diabetes Sister     . Hypertension Sister   . CAD Mother   . CAD Father   . CAD Brother    Allergies  Allergen Reactions  . Iohexol Anaphylaxis     Code: SOB, Desc: respiratory distress-ASM, Onset Date: 24424582    Prior to Admission medications   Medication Sig Start Date End Date Taking? Authorizing Provider  aspirin 81 MG tablet Take 81 mg by mouth daily.   Yes Historical Provider, MD  Calcium Citrate-Vitamin D (CALCIUM + D PO) Take by mouth.   Yes Historical Provider, MD  diclofenac (VOLTAREN) 75 MG EC tablet Take 75 mg by mouth daily.   Yes Historical Provider, MD  FLUoxetine (PROZAC) 20 MG capsule Take 20 mg by mouth daily.   Yes Historical Provider, MD  glimepiride (AMARYL) 2 MG tablet Take 2 mg by mouth daily with breakfast.   Yes Historical Provider, MD  hydrochlorothiazide (MICROZIDE) 12.5 MG capsule Take 12.5 mg by mouth daily.   Yes Historical Provider, MD  lisinopril (PRINIVIL,ZESTRIL) 10 MG tablet Take 10 mg by mouth daily.   Yes Historical Provider, MD  lovastatin (MEVACOR) 20 MG tablet Take 20 mg by mouth at bedtime.   Yes Historical Provider, MD  metFORMIN (GLUCOPHAGE) 1000 MG tablet Take 1,000 mg by mouth 2 (two) times daily with a meal.  Yes Historical Provider, MD  Omega-3 Fatty Acids (FISH OIL) 1000 MG CAPS Take by mouth.   Yes Historical Provider, MD  saxagliptin HCl (ONGLYZA) 2.5 MG TABS tablet Take 5 mg by mouth daily.   Yes Historical Provider, MD  verapamil (VERELAN PM) 240 MG 24 hr capsule Take 240 mg by mouth at bedtime.   Yes Historical Provider, MD   ROS: The patient denies chills, night sweats, unintentional weight loss, chest pain, palpitations, wheezing, dyspnea on exertion, nausea, vomiting, abdominal pain, dysuria, hematuria, melena, numbness, weakness, or tingling. +fever, +cough, +sinus pressure, +congestion, +ear pain, +HA  All other systems have been reviewed and were otherwise negative with the exception of those mentioned in the HPI and as above.    PHYSICAL  EXAM: Filed Vitals:   11/20/15 1029  BP: 128/62  Pulse: 84  Temp: 97.5 F (36.4 C)  Resp: 16   Body mass index is 35.21 kg/(m^2).   General: Alert, no acute distress HEENT:  Normocephalic, atraumatic, oropharynx patent. Tubes in both TMs. Nose is significantly congested. Throat is red. Eye: [facility] Cardiovascular:  Regular rate and rhythm, no rubs murmurs or gallops. No Carotid bruits, radial pulse intact. No pedal edema.  Respiratory: Clear to auscultation bilaterally. No wheezes, rales, or rhonchi. No cyanosis, no use of accessory musculature Abdominal: No organomegaly, abdomen is soft and non-tender, positive bowel sounds. No masses. Musculoskeletal: Gait intact. No edema, tenderness Skin: No rashes. Neurologic: Facial musculature symmetric. Psychiatric: Patient acts appropriately throughout our interaction. Lymphatic: No cervical or submandibular lymphadenopathy Meds ordered this encounter  Medications  . aspirin 81 MG tablet    Sig: Take 81 mg by mouth daily.  . saxagliptin HCl (ONGLYZA) 2.5 MG TABS tablet    Sig: Take 5 mg by mouth daily.  . Omega-3 Fatty Acids (FISH OIL) 1000 MG CAPS    Sig: Take by mouth.  . Calcium Citrate-Vitamin D (CALCIUM + D PO)    Sig: Take by mouth.  Marland Kitchen amoxicillin-clavulanate (AUGMENTIN) 875-125 MG tablet    Sig: Take 1 tablet by mouth 2 (two) times daily.    Dispense:  20 tablet    Refill:  0   LABS: Results for orders placed or performed in visit on 11/20/15  POCT rapid strep A  Result Value Ref Range   Rapid Strep A Screen Negative Negative  POCT glucose (manual entry)  Result Value Ref Range   POC Glucose 155 (A) 70 - 99 mg/dl   EKG/XRAY:   Primary read interpreted by Dr. Everlene Farrier at [facility].  ASSESSMENT/PLAN: Strep test negative. Sugar acceptable at 155. Will treat with Augmentin 875 twice a day. She will also be on probiotics.I personally performed the services described in this documentation, which was scribed in my presence.  The recorded information has been reviewed and is accurate.    Gross sideeffects, risk and benefits, and alternatives of medications d/w patient. Patient is aware that all medications have potential sideeffects and we are unable to predict every sideeffect or drug-drug interaction that may occur.  Arlyss Queen MD 11/20/2015 10:59 AM

## 2015-11-20 NOTE — Patient Instructions (Signed)

## 2015-11-22 ENCOUNTER — Encounter: Payer: Self-pay | Admitting: Family Medicine

## 2015-11-27 ENCOUNTER — Ambulatory Visit (INDEPENDENT_AMBULATORY_CARE_PROVIDER_SITE_OTHER): Payer: Managed Care, Other (non HMO)

## 2015-11-27 ENCOUNTER — Ambulatory Visit (INDEPENDENT_AMBULATORY_CARE_PROVIDER_SITE_OTHER): Payer: Managed Care, Other (non HMO) | Admitting: Internal Medicine

## 2015-11-27 VITALS — BP 144/76 | HR 76 | Temp 97.9°F | Resp 18 | Ht 69.5 in | Wt 244.0 lb

## 2015-11-27 DIAGNOSIS — R05 Cough: Secondary | ICD-10-CM

## 2015-11-27 DIAGNOSIS — R059 Cough, unspecified: Secondary | ICD-10-CM

## 2015-11-27 MED ORDER — LEVOFLOXACIN 500 MG PO TABS: 500.0000 mg | ORAL_TABLET | Freq: Every day | ORAL | Status: DC

## 2015-11-27 MED ORDER — PREDNISONE 20 MG PO TABS: ORAL_TABLET | ORAL | Status: DC

## 2015-11-27 MED ORDER — HYDROCODONE-HOMATROPINE 5-1.5 MG/5ML PO SYRP: 5.0000 mL | ORAL_SOLUTION | Freq: Four times a day (QID) | ORAL | Status: DC | PRN

## 2015-11-27 MED ORDER — ALBUTEROL SULFATE HFA 108 (90 BASE) MCG/ACT IN AERS: 2.0000 | INHALATION_SPRAY | Freq: Four times a day (QID) | RESPIRATORY_TRACT | Status: DC | PRN

## 2015-11-27 NOTE — Progress Notes (Addendum)
Subjective:    Patient ID: Samantha [name], female    DOB: [birth year], 59 y.o.   MRN: [id number] By signing my name below, I, Judithe Modest, attest that this documentation has been prepared under the direction and in the presence of Tami Lin, MD. Electronically Signed: Judithe Modest, ER Scribe. 11/27/2015. 1:08 PM.  Chief Complaint  Patient presents with  . Follow-up    states she is still not feeling better since being seen 11/20/15  . Ear Pain  . Facial Pain  . Cough  . Sore Throat    HPI HPI Comments: Samantha [name] is a 59 y.o. female who presents to [facility] complaining of productive cough, sleep disturbance, sore throat, ear pain, and severe fatigue with associated wheezing for the last week. She reported to [facility] on 11/20/2015 and was prescribed Augmentin. She states her sx have not improved since starting the abx. She had a fever last Friday, but has not had one in the last few days. She has never been on an inhaler in the past.   She states her DM is well managed.   Past Medical History  Diagnosis Date  . Type II or unspecified type diabetes mellitus without mention of complication, not stated as uncontrolled   . Dyslipidemia   . Unspecified essential hypertension   . Arthritis   . Heart murmur 2011  . Nonspecific abnormal electrocardiogram (ECG) (EKG) 2011   Allergies  Allergen Reactions  . Iohexol Anaphylaxis     Code: SOB, Desc: respiratory distress-ASM, Onset Date: [id number]     Review of Systems  Constitutional: Negative for fever and chills.  HENT: Positive for congestion, ear pain and sore throat.   Respiratory: Positive for cough and wheezing. Negative for shortness of breath.        Objective:  BP 144/76 mmHg  Pulse 76  Temp(Src) 97.9 F (36.6 C)  Resp 18  Ht 5' 9.5" (1.765 m)  Wt 244 lb (110.678 kg)  BMI 35.53 kg/m2  SpO2 96%  Physical Exam  Constitutional: She is oriented to person, place, and time. She appears  well-developed and well-nourished. No distress.  HENT:  Head: Normocephalic and atraumatic.  Tubes patent in both ears. Nose boggy with purulent discharge. Throat clear.  Eyes: Pupils are equal, round, and reactive to light.  Neck: Neck supple.  Cardiovascular: Normal rate, regular rhythm and normal heart sounds.   Pulmonary/Chest: Effort normal. No respiratory distress.  Lungs with rhonchi at the right base. Wheeze with forced expiation bilaterally.  Musculoskeletal: Normal range of motion.  Neurological: She is alert and oriented to person, place, and time. Coordination normal.  Skin: Skin is warm and dry. She is not diaphoretic.  Psychiatric: She has a normal mood and affect. Her behavior is normal.  Nursing note and vitals reviewed.    [facility] reading (PRIMARY) by  Dr. Stasia Cavalier infiltrate   Assessment & Plan:  Cough - Plan: DG Chest 2 View Sinusitis--incomplete resp to augmentin Reactive Airway Dz  Meds ordered this encounter  Medications  . levofloxacin (LEVAQUIN) 500 MG tablet    Sig: Take 1 tablet (500 mg total) by mouth daily.    Dispense:  7 tablet    Refill:  0  . HYDROcodone-homatropine (HYCODAN) 5-1.5 MG/5ML syrup    Sig: Take 5 mLs by mouth every 6 (six) hours as needed.    Dispense:  120 mL    Refill:  0  . albuterol (PROVENTIL HFA;VENTOLIN HFA) 108 (90 Base) MCG/ACT inhaler  Sig: Inhale 2 puffs into the lungs every 6 (six) hours as needed for wheezing or shortness of breath.    Dispense:  1 Inhaler    Refill:  0  . predniSONE (DELTASONE) 20 MG tablet    Sig: 3/2/1 single daily dose for 3 days    Dispense:  6 tablet    Refill:  0  call if not responding for ref back to ent   I have completed the patient encounter in its entirety as documented by the scribe, with editing by me where necessary. Jeramyah Goodpasture P. Laney Pastor, M.D.

## 2015-11-28 HISTORY — PX: REPLACEMENT TOTAL KNEE: SUR1224

## 2015-11-28 HISTORY — PX: KNEE ARTHROSCOPY: SUR90

## 2016-02-04 ENCOUNTER — Other Ambulatory Visit: Payer: Self-pay

## 2016-02-04 DIAGNOSIS — Z1231 Encounter for screening mammogram for malignant neoplasm of breast: Secondary | ICD-10-CM

## 2016-02-16 ENCOUNTER — Ambulatory Visit
Admission: RE | Admit: 2016-02-16 | Discharge: 2016-02-16 | Disposition: A | Discharge status: Home / Self Care | Payer: 59 | Source: Ambulatory Visit

## 2016-02-16 DIAGNOSIS — Z1231 Encounter for screening mammogram for malignant neoplasm of breast: Secondary | ICD-10-CM

## 2016-02-28 ENCOUNTER — Ambulatory Visit (INDEPENDENT_AMBULATORY_CARE_PROVIDER_SITE_OTHER): Payer: 59 | Admitting: Family Medicine

## 2016-02-28 ENCOUNTER — Ambulatory Visit (INDEPENDENT_AMBULATORY_CARE_PROVIDER_SITE_OTHER): Payer: 59

## 2016-02-28 VITALS — BP 132/64 | HR 73 | Temp 97.4°F | Resp 16 | Ht 69.5 in | Wt 242.4 lb

## 2016-02-28 DIAGNOSIS — S99921A Unspecified injury of right foot, initial encounter: Secondary | ICD-10-CM | POA: Diagnosis not present

## 2016-02-28 DIAGNOSIS — M79609 Pain in unspecified limb: Secondary | ICD-10-CM | POA: Diagnosis not present

## 2016-02-28 DIAGNOSIS — S92912A Unspecified fracture of left toe(s), initial encounter for closed fracture: Secondary | ICD-10-CM | POA: Diagnosis not present

## 2016-02-28 DIAGNOSIS — M79676 Pain in unspecified toe(s): Secondary | ICD-10-CM

## 2016-02-28 NOTE — Progress Notes (Signed)
Patient ID: Samantha [name], female    DOB: 05-28-1956  Age: 60 y.o. MRN: [id number]  Chief Complaint  Patient presents with  . Toe Pain    injured right pinky toe hitting bed post this morning    Subjective:   Generally healthy 60 year old lady who stubbed her right fifth toe on the bedpost this morning. It hurts quite a lot. She can't wiggle it. It was fine prior to this accident.  Current allergies, medications, problem list, past/family and social histories reviewed.  Objective:  BP 132/64 mmHg  Pulse 73  Temp(Src) 97.4 F (36.3 C) (Oral)  Resp 16  Ht 5' 9.5" (1.765 m)  Wt 242 lb 6.4 oz (109.952 kg)  BMI 35.30 kg/m2  SpO2 96%  No major acute distress. Mild redness of the right fifth toe. Nontender in the first to fourth toes. She is not tender along the metatarsal until the joint and distal to it are palpated. There is no problem with the alignment.  Assessment & Plan:   Assessment: 1. Pain of fifth toe   2. Foot injury, right, initial encounter   3. Fracture, toe, left, closed, initial encounter       Plan: Fifth toe injury right foot, suspicious for compression fracture of the PIP. Will get an x-ray.  Orders Placed This Encounter  Procedures  . DG Toe 5th Right    Order Specific Question:  Reason for Exam (SYMPTOM  OR DIAGNOSIS REQUIRED)    Answer:  pain 5th toe, hit bedpost    Order Specific Question:  Preferred imaging location?    Answer:  External    No orders of the defined types were placed in this encounter.    X-rays very suspicious for a small chip fracture at the base of the proximal phalanx of the fifth toe. Radiologist couldn't be certain it was new, and felt like labral correlation need to be made. I believe it is a fracture.     Patient Instructions   Buddy tape the toe for the next few weeks until you feel like it is doing substantially better. I suspect there is a tiny chip of bone there. It should heal up unremarkably.  Take  anti-inflammatory medications if needed for pain, such as Aleve 2 pills twice daily or ibuprofen 3 pills 3 times daily.  Return if worse or further concerns. Wear stiff soled shoes as much time as possible.   Take caution try not to reinjure it.    IF you received an x-ray today, you will receive an invoice from [facility]. Please contact [facility] at [phone] with questions or concerns regarding your invoice.   IF you received labwork today, you will receive an invoice from [employer]. Please contact [facility] at [phone] with questions or concerns regarding your invoice.   Our billing staff will not be able to assist you with questions regarding bills from these companies.  You will be contacted with the lab results as soon as they are available. The fastest way to get your results is to activate your My Chart account. Instructions are located on the last page of this paperwork. If you have not heard from [place] regarding the results in 2 weeks, please contact this office.         Return if symptoms worsen or fail to improve.   HOPPER,DAVID, MD 02/28/2016

## 2016-02-28 NOTE — Patient Instructions (Addendum)
Buddy tape the toe for the next few weeks until you feel like it is doing substantially better. I suspect there is a tiny chip of bone there. It should heal up unremarkably.  Take anti-inflammatory medications if needed for pain, such as Aleve 2 pills twice daily or ibuprofen 3 pills 3 times daily.  Return if worse or further concerns. Wear stiff soled shoes as much time as possible.   Take caution try not to reinjure it.    IF you received an x-ray today, you will receive an invoice from [facility]. Please contact [facility] at [phone] with questions or concerns regarding your invoice.   IF you received labwork today, you will receive an invoice from [employer]. Please contact [facility] at [phone] with questions or concerns regarding your invoice.   Our billing staff will not be able to assist you with questions regarding bills from these companies.  You will be contacted with the lab results as soon as they are available. The fastest way to get your results is to activate your My Chart account. Instructions are located on the last page of this paperwork. If you have not heard from [place] regarding the results in 2 weeks, please contact this office.

## 2016-08-09 ENCOUNTER — Encounter (HOSPITAL_COMMUNITY): Payer: Self-pay | Admitting: Emergency Medicine

## 2016-08-09 ENCOUNTER — Observation Stay (HOSPITAL_COMMUNITY)
Admission: EM | Admit: 2016-08-09 | Discharge: 2016-08-10 | Disposition: A | Discharge status: Home / Self Care | Payer: 59 | Attending: Internal Medicine | Admitting: Internal Medicine

## 2016-08-09 ENCOUNTER — Emergency Department (HOSPITAL_COMMUNITY): Payer: 59

## 2016-08-09 DIAGNOSIS — Z7984 Long term (current) use of oral hypoglycemic drugs: Secondary | ICD-10-CM | POA: Insufficient documentation

## 2016-08-09 DIAGNOSIS — R011 Cardiac murmur, unspecified: Secondary | ICD-10-CM | POA: Insufficient documentation

## 2016-08-09 DIAGNOSIS — I152 Hypertension secondary to endocrine disorders: Secondary | ICD-10-CM | POA: Diagnosis present

## 2016-08-09 DIAGNOSIS — M1990 Unspecified osteoarthritis, unspecified site: Secondary | ICD-10-CM | POA: Insufficient documentation

## 2016-08-09 DIAGNOSIS — F329 Major depressive disorder, single episode, unspecified: Secondary | ICD-10-CM | POA: Diagnosis not present

## 2016-08-09 DIAGNOSIS — R079 Chest pain, unspecified: Principal | ICD-10-CM

## 2016-08-09 DIAGNOSIS — I447 Left bundle-branch block, unspecified: Secondary | ICD-10-CM | POA: Insufficient documentation

## 2016-08-09 DIAGNOSIS — D72829 Elevated white blood cell count, unspecified: Secondary | ICD-10-CM | POA: Diagnosis not present

## 2016-08-09 DIAGNOSIS — Z7982 Long term (current) use of aspirin: Secondary | ICD-10-CM | POA: Insufficient documentation

## 2016-08-09 DIAGNOSIS — Z6835 Body mass index (BMI) 35.0-35.9, adult: Secondary | ICD-10-CM | POA: Diagnosis not present

## 2016-08-09 DIAGNOSIS — E119 Type 2 diabetes mellitus without complications: Secondary | ICD-10-CM | POA: Insufficient documentation

## 2016-08-09 DIAGNOSIS — I10 Essential (primary) hypertension: Secondary | ICD-10-CM | POA: Insufficient documentation

## 2016-08-09 DIAGNOSIS — E1140 Type 2 diabetes mellitus with diabetic neuropathy, unspecified: Secondary | ICD-10-CM

## 2016-08-09 DIAGNOSIS — E785 Hyperlipidemia, unspecified: Secondary | ICD-10-CM | POA: Insufficient documentation

## 2016-08-09 DIAGNOSIS — E1159 Type 2 diabetes mellitus with other circulatory complications: Secondary | ICD-10-CM | POA: Diagnosis present

## 2016-08-09 DIAGNOSIS — Z79899 Other long term (current) drug therapy: Secondary | ICD-10-CM | POA: Insufficient documentation

## 2016-08-09 DIAGNOSIS — E1169 Type 2 diabetes mellitus with other specified complication: Secondary | ICD-10-CM

## 2016-08-09 HISTORY — DX: Left bundle-branch block, unspecified: I44.7

## 2016-08-09 LAB — BASIC METABOLIC PANEL
Anion gap: 12 (ref 5–15)
BUN: 25 mg/dL — ABNORMAL HIGH (ref 6–20)
CO2: 25 mmol/L (ref 22–32)
Calcium: 9.5 mg/dL (ref 8.9–10.3)
Chloride: 99 mmol/L — ABNORMAL LOW (ref 101–111)
Creatinine, Ser: 0.92 mg/dL (ref 0.44–1.00)
GFR calc Af Amer: >60 mL/min (ref >60)
GFR calc non Af Amer: >60 mL/min (ref >60)
Glucose, Bld: 137 mg/dL — ABNORMAL HIGH (ref 65–99)
Potassium: 3.9 mmol/L (ref 3.5–5.1)
Sodium: 136 mmol/L (ref 135–145)

## 2016-08-09 LAB — CBC
HCT: 42.9 % (ref 36.0–46.0)
Hemoglobin: 14.3 g/dL (ref 12.0–15.0)
MCH: 28.9 pg (ref 26.0–34.0)
MCHC: 33.3 g/dL (ref 30.0–36.0)
MCV: 86.8 fL (ref 78.0–100.0)
Platelets: 361 10*3/uL (ref 150–400)
RBC: 4.94 MIL/uL (ref 3.87–5.11)
RDW: 13.8 % (ref 11.5–15.5)
WBC: 21.1 10*3/uL — ABNORMAL HIGH (ref 4.0–10.5)

## 2016-08-09 LAB — HEPATIC FUNCTION PANEL
ALT: 22 U/L (ref 14–54)
AST: 16 U/L (ref 15–41)
Albumin: 4 g/dL (ref 3.5–5.0)
Alkaline Phosphatase: 57 U/L (ref 38–126)
Bilirubin, Direct: <0.1 mg/dL — ABNORMAL LOW (ref 0.1–0.5)
Total Bilirubin: 0.7 mg/dL (ref 0.3–1.2)
Total Protein: 7.7 g/dL (ref 6.5–8.1)

## 2016-08-09 LAB — URINALYSIS, ROUTINE W REFLEX MICROSCOPIC
Bilirubin Urine: NEGATIVE
Glucose, UA: NEGATIVE mg/dL
Hgb urine dipstick: NEGATIVE
Ketones, ur: NEGATIVE mg/dL
Nitrite: NEGATIVE
Protein, ur: NEGATIVE mg/dL
Specific Gravity, Urine: 1.014 (ref 1.005–1.030)
pH: 5.5 (ref 5.0–8.0)

## 2016-08-09 LAB — URINE MICROSCOPIC-ADD ON
Bacteria, UA: NONE SEEN
RBC / HPF: NONE SEEN RBC/hpf (ref 0–5)
Squamous Epithelial / LPF: NONE SEEN

## 2016-08-09 LAB — I-STAT TROPONIN, ED
Troponin i, poc: 0.01 ng/mL (ref 0.00–0.08)
Troponin i, poc: 0.01 ng/mL (ref 0.00–0.08)

## 2016-08-09 LAB — LIPASE, BLOOD: Lipase: 24 U/L (ref 11–51)

## 2016-08-09 LAB — D-DIMER, QUANTITATIVE: D-Dimer, Quant: 0.52 ug/mL-FEU — ABNORMAL HIGH (ref 0.00–0.50)

## 2016-08-09 MED ORDER — ASPIRIN 81 MG PO CHEW
243.0000 mg | CHEWABLE_TABLET | Freq: Once | ORAL | Status: AC
  Administered 2016-08-09: 243 mg via ORAL
  Filled 2016-08-09: qty 3

## 2016-08-09 MED ORDER — FLUOXETINE HCL 20 MG PO CAPS
20.0000 mg | ORAL_CAPSULE | Freq: Every day | ORAL | Status: DC
  Administered 2016-08-10: 20 mg via ORAL
  Filled 2016-08-09: qty 1

## 2016-08-09 MED ORDER — NITROGLYCERIN 0.4 MG SL SUBL: 0.4000 mg | SUBLINGUAL_TABLET | SUBLINGUAL | Status: DC | PRN

## 2016-08-09 MED ORDER — ACETAMINOPHEN 325 MG PO TABS: 650.0000 mg | ORAL_TABLET | ORAL | Status: DC | PRN

## 2016-08-09 MED ORDER — SODIUM CHLORIDE 0.9 % IV SOLN
INTRAVENOUS | Status: DC
Start: 2016-08-09 — End: 2016-08-10
  Administered 2016-08-09 – 2016-08-10 (×2): via INTRAVENOUS

## 2016-08-09 MED ORDER — VERAPAMIL HCL ER 240 MG PO TBCR
240.0000 mg | EXTENDED_RELEASE_TABLET | Freq: Every day | ORAL | Status: DC
  Administered 2016-08-10: 240 mg via ORAL
  Filled 2016-08-09: qty 1

## 2016-08-09 MED ORDER — ALBUTEROL SULFATE (2.5 MG/3ML) 0.083% IN NEBU: 2.5000 mg | INHALATION_SOLUTION | Freq: Four times a day (QID) | RESPIRATORY_TRACT | Status: DC | PRN

## 2016-08-09 MED ORDER — ONDANSETRON HCL 4 MG/2ML IJ SOLN
4.0000 mg | Freq: Once | INTRAMUSCULAR | Status: AC
  Administered 2016-08-09: 4 mg via INTRAVENOUS
  Filled 2016-08-09: qty 2

## 2016-08-09 MED ORDER — ALBUTEROL SULFATE HFA 108 (90 BASE) MCG/ACT IN AERS: 2.0000 | INHALATION_SPRAY | Freq: Four times a day (QID) | RESPIRATORY_TRACT | Status: DC | PRN

## 2016-08-09 MED ORDER — PRAVASTATIN SODIUM 20 MG PO TABS: 20.0000 mg | ORAL_TABLET | Freq: Every day | ORAL | Status: DC

## 2016-08-09 MED ORDER — ASPIRIN EC 81 MG PO TBEC
81.0000 mg | DELAYED_RELEASE_TABLET | Freq: Every day | ORAL | Status: DC
  Administered 2016-08-10: 81 mg via ORAL
  Filled 2016-08-09: qty 1

## 2016-08-09 MED ORDER — IPRATROPIUM-ALBUTEROL 0.5-2.5 (3) MG/3ML IN SOLN: 3.0000 mL | RESPIRATORY_TRACT | Status: DC | PRN

## 2016-08-09 MED ORDER — NITROGLYCERIN 0.4 MG SL SUBL
0.4000 mg | SUBLINGUAL_TABLET | Freq: Once | SUBLINGUAL | Status: AC
  Administered 2016-08-09: 0.4 mg via SUBLINGUAL
  Filled 2016-08-09: qty 1

## 2016-08-09 MED ORDER — ONDANSETRON HCL 4 MG/2ML IJ SOLN: 4.0000 mg | Freq: Four times a day (QID) | INTRAMUSCULAR | Status: DC | PRN

## 2016-08-09 MED ORDER — GI COCKTAIL ~~LOC~~: 30.0000 mL | Freq: Four times a day (QID) | ORAL | Status: DC | PRN

## 2016-08-09 MED ORDER — MORPHINE SULFATE (PF) 2 MG/ML IV SOLN: 2.0000 mg | INTRAVENOUS | Status: DC | PRN

## 2016-08-09 MED ORDER — LISINOPRIL 10 MG PO TABS
10.0000 mg | ORAL_TABLET | Freq: Every day | ORAL | Status: DC
  Administered 2016-08-09: 10 mg via ORAL
  Filled 2016-08-09: qty 1

## 2016-08-09 MED ORDER — MORPHINE SULFATE (PF) 4 MG/ML IV SOLN
4.0000 mg | Freq: Once | INTRAVENOUS | Status: AC
  Administered 2016-08-09: 4 mg via INTRAVENOUS
  Filled 2016-08-09: qty 1

## 2016-08-09 MED ORDER — PANTOPRAZOLE SODIUM 40 MG PO TBEC
40.0000 mg | DELAYED_RELEASE_TABLET | Freq: Every day | ORAL | Status: DC
  Administered 2016-08-09 – 2016-08-10 (×2): 40 mg via ORAL
  Filled 2016-08-09 (×2): qty 1

## 2016-08-09 MED ORDER — OXYCODONE-ACETAMINOPHEN 5-325 MG PO TABS: 1.0000 | ORAL_TABLET | Freq: Four times a day (QID) | ORAL | Status: DC | PRN

## 2016-08-09 MED ORDER — ENOXAPARIN SODIUM 40 MG/0.4ML ~~LOC~~ SOLN
40.0000 mg | Freq: Every day | SUBCUTANEOUS | Status: DC
  Administered 2016-08-09: 40 mg via SUBCUTANEOUS
  Filled 2016-08-09: qty 0.4

## 2016-08-09 NOTE — ED Triage Notes (Signed)
Pt from home with c/o sharp chest pain that began on Sat and radiates to her back. Pt states that while she was mopping her floor today her pain got worse and she began feeling nauseous, lightheaded, and diaphoretic. Pt rates her pain at an 8/10. Pt states that she had similar symptoms "years ago" she saw a cardiologist and was told her pain was not cardiac related. Pt states that pain today was of gradual onset. Pt has strong bilateral radial pulses, clear lung sounds, and normal heart sounds

## 2016-08-09 NOTE — H&P (Signed)
History and Physical    Samantha [name] HYW:[id number] DOB: 09/18/1956 DOA: 08/09/2016  Referring MD/NP/PA: Antonietta Breach PA-C PCP: No primary care provider on file.  Patient coming from: Home  Chief Complaint: Chest pain  HPI: Samantha [name] is a 60 y.o. female with medical history significant of HTN, HLD, DM type 2, and heart murmur; who presents with complaints of chest pain. Symptoms initially started 4 days ago. Pain was sharp like a knife and located on the left substernal border with radiation to the back. Patient reports symptoms were previously intermittent, had been usually brought on with exertion, and relieved with rest. However, today while mopping she noted increased intensity of pain that was constant. Rated pain 8/10; while previous episodes had been 5-7/10 on the pain scale. Tried sitting down and resting. Her husband came home approximately 20 minutes after the onset of symptoms, but he found her still in significant pain holding her chest. Associated symptoms included tiredness/fatigue, generalized weakness, diaphoresis, nausea, and shortness of breath. Denies any recent sick contacts, palpitations, abdominal pain, vomiting, weight gain, significant lower extremity swelling, cough, dysuria, loss of consciousness, or focal weakness. Reports having similar symptoms approximately 10 years ago for which she was set up to have a cardiac cath, but the procedure had to be stopped secondary to allergic reaction to the contrast dye. Thereafter, the patient notes that the rest of the workup was inconclusive for any signs of heart damage.  ED Course: Upon admission into the emergency department patient was seen to be tachypneic, but all other vital signs relatively within normal limits. Lab work revealed elevated WBC 21.1, d-dimer 0.52, troponin 2 negative. Chest x-ray showed no acute abnormalities. EKG shows sinus rhythm with LBBB which was seen on previous tracings back in 2015. Patient  was given aspirin, nitroglycerin, and morphine with improvement of pain.  Review of Systems: As per HPI otherwise 10 point review of systems negative.   Past Medical History:  Diagnosis Date  . Arthritis   . Dyslipidemia   . Heart murmur 2011  . Nonspecific abnormal electrocardiogram (ECG) (EKG) 2011  . Type II or unspecified type diabetes mellitus without mention of complication, not stated as uncontrolled   . Unspecified essential hypertension     Past Surgical History:  Procedure Laterality Date  . TONSILLECTOMY  1962     reports that she has never smoked. She has never used smokeless tobacco. She reports that she does not drink alcohol or use drugs.  Allergies  Allergen Reactions  . Iohexol Anaphylaxis     Code: SOB, Desc: respiratory distress-ASM, Onset Date: [id number]     Family History  Problem Relation Age of Onset  . Diabetes Mother   . Hypertension Mother   . CAD Mother   . Hypertension Father   . Diabetes Father   . CAD Father   . Diabetes Brother   . Hyperlipidemia Brother   . Hypertension Brother   . CAD Brother   . Diabetes Sister   . Hypertension Sister     Prior to Admission medications   Medication Sig Start Date End Date Taking? Authorizing Provider  albuterol (PROVENTIL HFA;VENTOLIN HFA) 108 (90 Base) MCG/ACT inhaler Inhale 2 puffs into the lungs every 6 (six) hours as needed for wheezing or shortness of breath. 11/27/15  Yes Leandrew Koyanagi, MD  aspirin 81 MG tablet Take 81 mg by mouth every morning.    Yes Historical Provider, MD  Calcium Citrate-Vitamin D (CALCIUM + D PO) Take 1 tablet  by mouth every morning.    Yes Historical Provider, MD  FLUoxetine (PROZAC) 20 MG capsule Take 20 mg by mouth every morning.    Yes Historical Provider, MD  glimepiride (AMARYL) 2 MG tablet Take 2 mg by mouth daily with breakfast.   Yes Historical Provider, MD  hydrochlorothiazide (MICROZIDE) 12.5 MG capsule Take 12.5 mg by mouth every morning.    Yes  Historical Provider, MD  lisinopril (PRINIVIL,ZESTRIL) 10 MG tablet Take 10 mg by mouth at bedtime.    Yes Historical Provider, MD  lovastatin (MEVACOR) 20 MG tablet Take 20 mg by mouth at bedtime.   Yes Historical Provider, MD  meloxicam (MOBIC) 15 MG tablet Take 15 mg by mouth daily as needed for pain.  07/29/16  Yes Historical Provider, MD  metFORMIN (GLUCOPHAGE) 1000 MG tablet Take 1,000 mg by mouth 2 (two) times daily with a meal.   Yes Historical Provider, MD  Omega-3 Fatty Acids (FISH OIL) 1000 MG CAPS Take 1,000 mg by mouth every morning.    Yes Historical Provider, MD  oxyCODONE-acetaminophen (PERCOCET/ROXICET) 5-325 MG tablet Take 1 tablet by mouth every 6 (six) hours as needed for moderate pain or severe pain.  06/27/16  Yes Historical Provider, MD  saxagliptin HCl (ONGLYZA) 2.5 MG TABS tablet Take 5 mg by mouth every morning.    Yes Historical Provider, MD  sitaGLIPtin (JANUVIA) 100 MG tablet Take 100 mg by mouth every morning. 07/10/16  Yes Historical Provider, MD  verapamil (VERELAN PM) 240 MG 24 hr capsule Take 240 mg by mouth every morning.    Yes Historical Provider, MD  predniSONE (DELTASONE) 20 MG tablet 3/2/1 single daily dose for 3 days Patient not taking: Reported on 08/09/2016 11/27/15   Leandrew Koyanagi, MD    Physical Exam:    Constitutional: Female who appears in NAD, calm, comfortable at this time Vitals:   08/09/16 1834 08/09/16 1836 08/09/16 2002 08/09/16 2030  BP: (!) 127/50  154/89 169/71  Pulse: 80  70 71  Resp: _0 SpO2: 98%  96% 97%  Weight:  109.8 kg (242 lb)    Height:  _1  (1.778 m)     Eyes: PERRL, lids and conjunctivae normal ENMT: Mucous membranes are moist. Posterior pharynx clear of any exudate or lesions.Normal dentition.  Neck: normal, supple, no masses, no thyromegaly Respiratory: clear to auscultation bilaterally, no wheezing, no crackles. Normal respiratory effort. No accessory muscle use.  Cardiovascular: Regular rate and rhythm,  no murmurs / rubs / gallops. No extremity edema. 2+ pedal pulses. No carotid bruits. Chest pain not reproducible with palpation. Abdomen: no tenderness, no masses palpated. No hepatosplenomegaly. Bowel sounds positive.  Musculoskeletal: no clubbing / cyanosis. No joint deformity upper and lower extremities. Good ROM, no contractures. Normal muscle tone.  Skin: no rashes, lesions, ulcers. No induration Neurologic: CN 2-12 grossly intact. Sensation intact, DTR normal. Strength 5/5 in all 4.  Psychiatric: Normal judgment and insight. Alert and oriented x 3. Normal mood.     Labs on Admission: I have personally reviewed following labs and imaging studies  CBC:  Recent Labs Lab 08/09/16 1906  WBC 21.1*  HGB 14.3  HCT 42.9  MCV 86.8  PLT 366   Basic Metabolic Panel:  Recent Labs Lab 08/09/16 1906  NA 136  K 3.9  CL 99*  CO2 25  GLUCOSE 137*  BUN 25*  CREATININE 0.92  CALCIUM 9.5   GFR: Estimated Creatinine Clearance: 85.1 mL/min (by C-G formula based  on SCr of 0.92 mg/dL). Liver Function Tests:  Recent Labs Lab 08/09/16 1906  AST 16  ALT 22  ALKPHOS 57  BILITOT 0.7  PROT 7.7  ALBUMIN 4.0    Recent Labs Lab 08/09/16 1906  LIPASE 24   No results for input(s): AMMONIA in the last 168 hours. Coagulation Profile: No results for input(s): INR, PROTIME in the last 168 hours. Cardiac Enzymes: No results for input(s): CKTOTAL, CKMB, CKMBINDEX, TROPONINI in the last 168 hours. BNP (last 3 results) No results for input(s): PROBNP in the last 8760 hours. HbA1C: No results for input(s): HGBA1C in the last 72 hours. CBG: No results for input(s): GLUCAP in the last 168 hours. Lipid Profile: No results for input(s): CHOL, HDL, LDLCALC, TRIG, CHOLHDL, LDLDIRECT in the last 72 hours. Thyroid Function Tests: No results for input(s): TSH, T4TOTAL, FREET4, T3FREE, THYROIDAB in the last 72 hours. Anemia Panel: No results for input(s): VITAMINB12, FOLATE, FERRITIN, TIBC,  IRON, RETICCTPCT in the last 72 hours. Urine analysis:    Component Value Date/Time   COLORURINE YELLOW 08/24/2009 0903   APPEARANCEUR CLEAR 08/24/2009 0903   LABSPEC 1.023 08/24/2009 0903   PHURINE 6.0 08/24/2009 0903   GLUCOSEU NEGATIVE 08/24/2009 0903   HGBUR NEGATIVE 08/24/2009 0903   BILIRUBINUR NEGATIVE 08/24/2009 0903   KETONESUR NEGATIVE 08/24/2009 0903   PROTEINUR NEGATIVE 08/24/2009 0903   UROBILINOGEN 0.2 08/24/2009 0903   NITRITE NEGATIVE 08/24/2009 0903   LEUKOCYTESUR  08/24/2009 0903    NEGATIVE MICROSCOPIC NOT DONE ON URINES WITH NEGATIVE PROTEIN, BLOOD, LEUKOCYTES, NITRITE, OR GLUCOSE <1000 mg/dL.   Sepsis Labs: No results found for this or any previous visit (from the past 240 hour(s)).   Radiological Exams on Admission: Dg Chest 2 View  Result Date: 08/09/2016 CLINICAL DATA:  Chest pain EXAM: CHEST  2 VIEW COMPARISON:  None FINDINGS: The heart size and mediastinal contours are within normal limits. Chronic asymmetric elevation of the left hemidiaphragm. Both lungs are clear. The visualized skeletal structures are unremarkable. IMPRESSION: 1. No acute cardiopulmonary abnormalities Electronically Signed   By: Kerby Moors M.D.   On: 08/09/2016 19:00    EKG: Independently reviewed. Sinus rhythm with left bundle branch block  Assessment/Plan Chest pain: Acute.Heart score =4 - Admit to telemetry bed - Trend cardiac enzymes - Check echocardiogram and repeat EKG in a.m. - Morphine/ nitroglycerin prn pain - Continue aspirin    LBBB - Continue to monitor  Leukocytosis: Acute. WBC 21.1 - f/u urinalysis - Check CBC w/diff in a.m.  Diabetes mellitus type 2 - Hypoglycemic protocols - Hold Januvia, glimepiride, metformin, and saxagliptin - CBG checks every 4 hours overnight monitoring for possible hypoglycemia  Suspected dehydration: Patient with elevated BUN to creatinine ratio 25: 0.92. Note patient taking hydrochlorothiazide for hypertension.   - Gentle  normal saline IV fluids at 75 mL - Hold hydrochlorothiazide  Generalized weakness/malaise - Continue gentle IV fluid hydration  Elevated d-dimer: Acute. D-dimer 0.52. Within normal limits when adjusted for age.   Essential hypertension - Hold hydrochlorothiazide - Continue lisinopril and  verapamil  Hyperlipidemia - Continue pharmacy substitution of lovastatin for pravastatin  Depression - Continue Prozac   Gerd prophylaxis: Protonix  DVT prophylaxis:  Lovenox  Code Status: Full Family Communication: Discussed plan of care with the patient, daughter, and son-in-law at bedside Disposition Plan: Discharge home if workup negative  Consults called:  none Admission status: Telemetry for observation  Norval Morton MD [facility] Pager [phone]  If 7PM-7AM, please contact night-coverage  [email or web address] Password [facility]  08/09/2016, 10:25 PM

## 2016-08-09 NOTE — ED Notes (Signed)
Timer for pt bed starts now. Hospitalist at bedside

## 2016-08-09 NOTE — ED Provider Notes (Signed)
[facility] DEPT Provider Note   CSN: [id number] Arrival date & time: 08/09/16  1825  By signing my name below, I, Dora Sims, attest that this documentation has been prepared under the direction and in the presence of [employer], PA-C. Electronically Signed: Dora Sims, Scribe. 08/09/2016. 8:14 PM.  History   Chief Complaint Chief Complaint  Patient presents with  . Chest Pain  . Shortness of Breath    The history is provided by the patient. No language interpreter was used.     HPI Comments: Samantha [name] is a 60 y.o. female with PMHx of HTN, dyslipidemia, heart murmur, and DM type II who presents to the Emergency Department complaining of sudden onset, constant, worsening, sharp, chest pain beginning a few days ago. Pt reports her pain radiates into her left scapula. She rates her current chest pain as 7/10 (at rest). Pt endorses pain exacerbation with exertion and deep inspiration. Pt notes associated SOB and states she is having difficulty breathing currently. She also reports that she experienced nausea, fatigue, weakness, lightheadedness, and diaphoresis while mopping several hours ago and states her CP and SOB worsened at this time as well. Pt reports she experienced similar symptoms 10 years ago and had a stress test and cardiac catheterization. She notes she has not seen a cardiologist since then. Pt has not tried any medications for her symptoms since onset. She uses a baby aspirin daily and has taken her regular dosage today. She denies sick contacts with similar symptoms. Pt denies h/o ulcer or cholecystectomy. She further denies syncope, numbness/tingling, fever, melena, or any other associated symptoms.  PCP: Dr. Nadara Mustard   Past Medical History:  Diagnosis Date  . Arthritis   . Dyslipidemia   . Heart murmur 2011  . Nonspecific abnormal electrocardiogram (ECG) (EKG) 2011  . Type II or unspecified type diabetes mellitus without mention of complication, not  stated as uncontrolled   . Unspecified essential hypertension     Patient Active Problem List   Diagnosis Date Noted  . Chest pain 08/09/2016  . Diabetes ([place]) 11/20/2015    Past Surgical History:  Procedure Laterality Date  . TONSILLECTOMY  1962    OB History    No data available       Home Medications    Prior to Admission medications   Medication Sig Start Date End Date Taking? Authorizing Provider  albuterol (PROVENTIL HFA;VENTOLIN HFA) 108 (90 Base) MCG/ACT inhaler Inhale 2 puffs into the lungs every 6 (six) hours as needed for wheezing or shortness of breath. 11/27/15   Leandrew Koyanagi, MD  aspirin 81 MG tablet Take 81 mg by mouth daily.    Historical Provider, MD  Calcium Citrate-Vitamin D (CALCIUM + D PO) Take by mouth.    Historical Provider, MD  FLUoxetine (PROZAC) 20 MG capsule Take 20 mg by mouth daily.    Historical Provider, MD  glimepiride (AMARYL) 2 MG tablet Take 2 mg by mouth daily with breakfast.    Historical Provider, MD  hydrochlorothiazide (MICROZIDE) 12.5 MG capsule Take 12.5 mg by mouth daily.    Historical Provider, MD  HYDROcodone-homatropine (HYCODAN) 5-1.5 MG/5ML syrup Take 5 mLs by mouth every 6 (six) hours as needed. 11/27/15   Leandrew Koyanagi, MD  levofloxacin (LEVAQUIN) 500 MG tablet Take 1 tablet (500 mg total) by mouth daily. 11/27/15   Leandrew Koyanagi, MD  lisinopril (PRINIVIL,ZESTRIL) 10 MG tablet Take 10 mg by mouth daily.    Historical Provider, MD  lovastatin (MEVACOR) 20  MG tablet Take 20 mg by mouth at bedtime.    Historical Provider, MD  metFORMIN (GLUCOPHAGE) 1000 MG tablet Take 1,000 mg by mouth 2 (two) times daily with a meal.    Historical Provider, MD  Omega-3 Fatty Acids (FISH OIL) 1000 MG CAPS Take by mouth.    Historical Provider, MD  predniSONE (DELTASONE) 20 MG tablet 3/2/1 single daily dose for 3 days 11/27/15   Leandrew Koyanagi, MD  saxagliptin HCl (ONGLYZA) 2.5 MG TABS tablet Take 5 mg by mouth daily.     Historical Provider, MD  verapamil (VERELAN PM) 240 MG 24 hr capsule Take 240 mg by mouth at bedtime.    Historical Provider, MD    Family History Family History  Problem Relation Age of Onset  . Diabetes Mother   . Hypertension Mother   . CAD Mother   . Hypertension Father   . Diabetes Father   . CAD Father   . Diabetes Brother   . Hyperlipidemia Brother   . Hypertension Brother   . CAD Brother   . Diabetes Sister   . Hypertension Sister     Social History Social History  Substance Use Topics  . Smoking status: Never Smoker  . Smokeless tobacco: Never Used  . Alcohol use No     Allergies   Iohexol   Review of Systems Review of Systems A complete 10 system review of systems was obtained and all systems are negative except as noted in the HPI and PMH.    Physical Exam Updated Vital Signs BP 169/71   Pulse 71   Resp 17   Ht 5\' 10"  (1.778 m)   Wt 109.8 kg   SpO2 97%   BMI 34.72 kg/m   Physical Exam  Constitutional: She is oriented to person, place, and time. She appears well-developed and well-nourished. No distress.  Nontoxic appearing and in NAD  HENT:  Head: Normocephalic and atraumatic.  Eyes: Conjunctivae and EOM are normal. No scleral icterus.  Neck: Normal range of motion.  No JVD  Pulmonary/Chest: Effort normal. No respiratory distress. She has no wheezes. She has no rales.  Respirations even and unlabored. Lungs CTAB.  Abdominal: Soft. She exhibits no distension and no mass. There is no tenderness. There is no guarding.  Soft, nontender, obese abdomen.  Musculoskeletal: Normal range of motion.  Neurological: She is alert and oriented to person, place, and time.  GCS 15. Patient moving all extremities.  Skin: Skin is warm and dry. No rash noted. She is not diaphoretic. No erythema. No pallor.  Psychiatric: She has a normal mood and affect. Her behavior is normal.  Nursing note and vitals reviewed.    ED Treatments / Results  Labs (all labs  ordered are listed, but only abnormal results are displayed) Labs Reviewed  BASIC METABOLIC PANEL - Abnormal; Notable for the following:       Result Value   Chloride 99 (*)    Glucose, Bld 137 (*)    BUN 25 (*)    All other components within normal limits  CBC - Abnormal; Notable for the following:    WBC 21.1 (*)    All other components within normal limits  HEPATIC FUNCTION PANEL - Abnormal; Notable for the following:    Bilirubin, Direct <0.1 (*)    All other components within normal limits  D-DIMER, QUANTITATIVE (NOT AT [facility]) - Abnormal; Notable for the following:    D-Dimer, Quant 0.52 (*)    All other components  within normal limits  LIPASE, BLOOD  URINALYSIS, ROUTINE W REFLEX MICROSCOPIC (NOT AT [facility])  I-STAT TROPOININ, ED  I-STAT TROPOININ, ED    EKG  EKG Interpretation  Date/Time:  Wednesday August 09 2016 18:35:01 EDT Ventricular Rate:  78 PR Interval:    QRS Duration: 137 QT Interval:  411 QTC Calculation: 469 R Axis:   -15 Text Interpretation:  Sinus rhythm Left bundle branch block Baseline wander in lead(s) III aVF Within the limitations of artifact, no significant changes since last tracing Confirmed by ISAACS MD, Lysbeth Galas [id number]) on 08/09/2016 6:47:46 PM       Radiology Dg Chest 2 View  Result Date: 08/09/2016 CLINICAL DATA:  Chest pain EXAM: CHEST  2 VIEW COMPARISON:  None FINDINGS: The heart size and mediastinal contours are within normal limits. Chronic asymmetric elevation of the left hemidiaphragm. Both lungs are clear. The visualized skeletal structures are unremarkable. IMPRESSION: 1. No acute cardiopulmonary abnormalities Electronically Signed   By: Kerby Moors M.D.   On: 08/09/2016 19:00    Procedures Procedures (including critical care time)  DIAGNOSTIC STUDIES: Oxygen Saturation is 98% on RA, normal by my interpretation.    COORDINATION OF CARE: 8:20 PM Discussed treatment plan with pt at bedside and pt agreed to plan.  Medications  Ordered in ED Medications  aspirin chewable tablet 243 mg (243 mg Oral Given 08/09/16 2048)  nitroGLYCERIN (NITROSTAT) SL tablet 0.4 mg (0.4 mg Sublingual Given 08/09/16 2115)  morphine 4 MG/ML injection 4 mg (4 mg Intravenous Given 08/09/16 2116)  ondansetron (ZOFRAN) injection 4 mg (4 mg Intravenous Given 08/09/16 2116)     Initial Impression / Assessment and Plan / ED Course  I have reviewed the triage vital signs and the nursing notes.  Pertinent labs & imaging results that were available during my care of the patient were reviewed by me and considered in my medical decision making (see chart for details).  Clinical Course    60 year old female to be admitted for chest pain rule out. She reports chest pain radiating to her left scapula and worse with exertion. Symptoms associated with shortness of breath, nausea, and diaphoresis today. Patient is currently chest pain-free after receiving nitroglycerin and 4mg  IV morphine. Age adjusted D dimer is negative; doubt PE or dissection. Leukocytosis is not suspected to be secondary to infectious etiology; patient afebrile today. Case discussed with Dr. Tamala Julian of [facility] who will admit.   Final Clinical Impressions(s) / ED Diagnoses   Final diagnoses:  Chest pain, unspecified chest pain type    New Prescriptions New Prescriptions   No medications on file    I personally performed the services described in this documentation, which was scribed in my presence. The recorded information has been reviewed and is accurate.      Antonietta Breach, PA-C 08/09/16 2143    Tanna Furry, MD 08/15/16 [phone]

## 2016-08-10 ENCOUNTER — Observation Stay (HOSPITAL_BASED_OUTPATIENT_CLINIC_OR_DEPARTMENT_OTHER): Payer: 59

## 2016-08-10 ENCOUNTER — Encounter (HOSPITAL_COMMUNITY): Payer: Self-pay | Admitting: *Deleted

## 2016-08-10 DIAGNOSIS — I447 Left bundle-branch block, unspecified: Secondary | ICD-10-CM | POA: Diagnosis present

## 2016-08-10 DIAGNOSIS — I10 Essential (primary) hypertension: Secondary | ICD-10-CM | POA: Diagnosis not present

## 2016-08-10 DIAGNOSIS — R079 Chest pain, unspecified: Secondary | ICD-10-CM

## 2016-08-10 DIAGNOSIS — D72829 Elevated white blood cell count, unspecified: Secondary | ICD-10-CM | POA: Diagnosis present

## 2016-08-10 DIAGNOSIS — E119 Type 2 diabetes mellitus without complications: Secondary | ICD-10-CM | POA: Diagnosis not present

## 2016-08-10 DIAGNOSIS — E785 Hyperlipidemia, unspecified: Secondary | ICD-10-CM | POA: Diagnosis present

## 2016-08-10 DIAGNOSIS — E1159 Type 2 diabetes mellitus with other circulatory complications: Secondary | ICD-10-CM | POA: Diagnosis present

## 2016-08-10 DIAGNOSIS — R011 Cardiac murmur, unspecified: Secondary | ICD-10-CM | POA: Diagnosis not present

## 2016-08-10 DIAGNOSIS — I152 Hypertension secondary to endocrine disorders: Secondary | ICD-10-CM | POA: Diagnosis present

## 2016-08-10 LAB — TROPONIN I
Troponin I: <0.03 ng/mL (ref <0.03)
Troponin I: <0.03 ng/mL (ref <0.03)
Troponin I: <0.03 ng/mL (ref <0.03)

## 2016-08-10 LAB — ECHOCARDIOGRAM COMPLETE
Height: 69 in
Weight: 3827.19 oz

## 2016-08-10 LAB — CBC WITH DIFFERENTIAL/PLATELET
Basophils Absolute: 0.1 10*3/uL (ref 0.0–0.1)
Basophils Relative: 0 %
Eosinophils Absolute: 0.3 10*3/uL (ref 0.0–0.7)
Eosinophils Relative: 2 %
HCT: 40.8 % (ref 36.0–46.0)
Hemoglobin: 13.2 g/dL (ref 12.0–15.0)
Lymphocytes Relative: 31 %
Lymphs Abs: 4.9 10*3/uL — ABNORMAL HIGH (ref 0.7–4.0)
MCH: 28.6 pg (ref 26.0–34.0)
MCHC: 32.4 g/dL (ref 30.0–36.0)
MCV: 88.5 fL (ref 78.0–100.0)
Monocytes Absolute: 1 10*3/uL (ref 0.1–1.0)
Monocytes Relative: 6 %
Neutro Abs: 9.8 10*3/uL — ABNORMAL HIGH (ref 1.7–7.7)
Neutrophils Relative %: 61 %
Platelets: 316 10*3/uL (ref 150–400)
RBC: 4.61 MIL/uL (ref 3.87–5.11)
RDW: 14.2 % (ref 11.5–15.5)
WBC: 16.2 10*3/uL — ABNORMAL HIGH (ref 4.0–10.5)

## 2016-08-10 LAB — BASIC METABOLIC PANEL
Anion gap: 9 (ref 5–15)
BUN: 20 mg/dL (ref 6–20)
CO2: 29 mmol/L (ref 22–32)
Calcium: 9.2 mg/dL (ref 8.9–10.3)
Chloride: 100 mmol/L — ABNORMAL LOW (ref 101–111)
Creatinine, Ser: 0.79 mg/dL (ref 0.44–1.00)
GFR calc Af Amer: >60 mL/min (ref >60)
GFR calc non Af Amer: >60 mL/min (ref >60)
Glucose, Bld: 128 mg/dL — ABNORMAL HIGH (ref 65–99)
Potassium: 4.1 mmol/L (ref 3.5–5.1)
Sodium: 138 mmol/L (ref 135–145)

## 2016-08-10 LAB — GLUCOSE, CAPILLARY
Glucose-Capillary: 129 mg/dL — ABNORMAL HIGH (ref 65–99)
Glucose-Capillary: 130 mg/dL — ABNORMAL HIGH (ref 65–99)
Glucose-Capillary: 141 mg/dL — ABNORMAL HIGH (ref 65–99)
Glucose-Capillary: 236 mg/dL — ABNORMAL HIGH (ref 65–99)

## 2016-08-10 MED ORDER — NITROGLYCERIN 0.4 MG SL SUBL: 0.4000 mg | SUBLINGUAL_TABLET | SUBLINGUAL | 12 refills | Status: DC | PRN

## 2016-08-10 MED ORDER — ACETAMINOPHEN 325 MG PO TABS: 650.0000 mg | ORAL_TABLET | ORAL | Status: DC | PRN

## 2016-08-10 NOTE — Care Management Note (Signed)
Case Management Note  Patient Details  Name: Samantha [name] MRN: [id number] Date of Birth: 03-11-1956  Subjective/Objective: 60 y/o f admitted w/Chest pain. From home.                   Action/Plan:d/c plan home.   Expected Discharge Date:   (unknown)               Expected Discharge Plan:  Home/Self Care  In-House Referral:     Discharge planning Services  CM Consult  Post Acute Care Choice:    Choice offered to:     DME Arranged:    DME Agency:     HH Arranged:    HH Agency:     Status of Service:  In process, will continue to follow  If discussed at Long Length of Stay Meetings, dates discussed:    Additional Comments:  Dessa Phi, RN 08/10/2016, 1:28 PM

## 2016-08-10 NOTE — Progress Notes (Signed)
  Echocardiogram 2D Echocardiogram has been performed.  Samantha [name] 08/10/2016, 10:26 AM

## 2016-08-10 NOTE — Discharge Instructions (Signed)

## 2016-08-10 NOTE — Discharge Summary (Signed)
Physician Discharge Summary  Samantha [name] [id number] DOB: [birth year] DOA: 08/09/2016  PCP: No primary care provider on file.  Admit date: 08/09/2016 Discharge date: 08/10/2016  Recommendations for Outpatient Follow-up:  1. Pt will need to follow up with PCP in 2-3 weeks post discharge 2. Please obtain BMP to evaluate electrolytes and kidney function 3. PT will be called from cardiology office with appointment time and date and to discuss if stress test is warranted   Discharge Diagnoses:  Active Problems:   Chest pain   Discharge Condition: Stable  Diet recommendation: Heart healthy diet discussed in details   History of present illness:  60 y.o. female with medical history significant of HTN, HLD, DM type 2, and heart murmur; who presented with complaints of chest pain. Symptoms initially started 4 days PTA. Pain was sharp like a knife and located on the left substernal border with radiation to the back. Patient reports symptoms were previously intermittent, had been usually brought on with exertion, and relieved with rest.   ED Course: Lab work revealed elevated WBC 21.1, d-dimer 0.52, troponin 2 negative. Chest x-ray showed no acute abnormalities. EKG shows sinus rhythm with LBBB which was seen on previous tracings back in 2015. Patient was given aspirin, nitroglycerin, and morphine with improvement of pain.  Hospital Course:  Active Problems:   Chest pain - unclear etiology and appears to be musculoskeletal  - CE x 3 negative, no chest pain this AM - ECHO with stable EF, discussed with pt potential need for stress test and she is agreeable with outpatient follow up - called cardiology team on call and they will schedule and appointment for pt and will call her to notify     DM type II - continue home oral meds     HTN, essential - reasonable inpatient control - continue home medical regimen     Morbid obesity  - pt meets criteria with BMI > 35 and underlying  DM, HTN, HLD - Body mass index is 35.32 kg/m.    Leukocytosis - unclear etiology - no clear evidence of an infectious etiology, no urinary or cardiopulmonary concerns this AM   Procedures/Studies: Dg Chest 2 View  Result Date: 08/09/2016 CLINICAL DATA:  Chest pain EXAM: CHEST  2 VIEW COMPARISON:  None FINDINGS: The heart size and mediastinal contours are within normal limits. Chronic asymmetric elevation of the left hemidiaphragm. Both lungs are clear. The visualized skeletal structures are unremarkable. IMPRESSION: 1. No acute cardiopulmonary abnormalities Electronically Signed   By: Kerby Moors M.D.   On: 08/09/2016 19:00     Discharge Exam: Vitals:   08/10/16 0521 08/10/16 1028  BP:  (!) 120/49  Pulse:  63  Resp:    Temp: 98.1 F (36.7 C)    Vitals:   08/09/16 2256 08/10/16 0454 08/10/16 0521 08/10/16 1028  BP: (!) 145/62 (!) 129/49  (!) 120/49  Pulse: 67 67  63  Resp: 18 16    Temp: 98.5 F (36.9 C)  98.1 F (36.7 C)   TempSrc: Oral  Oral   SpO2: 95% 95%    Weight: 108.5 kg (239 lb 3.2 oz)     Height: 5\' 9"  (1.753 m)       General: Pt is alert, follows commands appropriately, not in acute distress Cardiovascular: Regular rate and rhythm, S1/S2 +, no murmurs, no rubs, no gallops Respiratory: Clear to auscultation bilaterally, no wheezing, no crackles, no rhonchi Abdominal: Soft, non tender, non distended, bowel sounds +, no guarding  Discharge Instructions  Discharge Instructions    Diet - low sodium heart healthy    Complete by:  As directed    Increase activity slowly    Complete by:  As directed        Medication List    TAKE these medications   acetaminophen 325 MG tablet Commonly known as:  TYLENOL Take 2 tablets (650 mg total) by mouth every 4 (four) hours as needed for headache or mild pain.   albuterol 108 (90 Base) MCG/ACT inhaler Commonly known as:  PROVENTIL HFA;VENTOLIN HFA Inhale 2 puffs into the lungs every 6 (six) hours as needed  for wheezing or shortness of breath.   aspirin 81 MG tablet Take 81 mg by mouth every morning.   CALCIUM + D PO Take 1 tablet by mouth every morning.   Fish Oil 1000 MG Caps Take 1,000 mg by mouth every morning.   FLUoxetine 20 MG capsule Commonly known as:  PROZAC Take 20 mg by mouth every morning.   glimepiride 2 MG tablet Commonly known as:  AMARYL Take 2 mg by mouth daily with breakfast.   hydrochlorothiazide 12.5 MG capsule Commonly known as:  MICROZIDE Take 12.5 mg by mouth every morning.   lisinopril 10 MG tablet Commonly known as:  PRINIVIL,ZESTRIL Take 10 mg by mouth at bedtime.   lovastatin 20 MG tablet Commonly known as:  MEVACOR Take 20 mg by mouth at bedtime.   meloxicam 15 MG tablet Commonly known as:  MOBIC Take 15 mg by mouth daily as needed for pain.   metFORMIN 1000 MG tablet Commonly known as:  GLUCOPHAGE Take 1,000 mg by mouth 2 (two) times daily with a meal.   nitroGLYCERIN 0.4 MG SL tablet Commonly known as:  NITROSTAT Place 1 tablet (0.4 mg total) under the tongue every 5 (five) minutes as needed for chest pain.   oxyCODONE-acetaminophen 5-325 MG tablet Commonly known as:  PERCOCET/ROXICET Take 1 tablet by mouth every 6 (six) hours as needed for moderate pain or severe pain.   predniSONE 20 MG tablet Commonly known as:  DELTASONE 3/2/1 single daily dose for 3 days   saxagliptin HCl 2.5 MG Tabs tablet Commonly known as:  ONGLYZA Take 5 mg by mouth every morning.   sitaGLIPtin 100 MG tablet Commonly known as:  JANUVIA Take 100 mg by mouth every morning.   verapamil 240 MG 24 hr capsule Commonly known as:  VERELAN PM Take 240 mg by mouth every morning.      Follow-up Information    Faye Ramsay, MD. Call today.   Specialty:  Internal Medicine Why:  call my cell phone wiht any questions [phone]  Contact information: [address] [place] [address] [phone]            The results  of significant diagnostics from this hospitalization (including imaging, microbiology, ancillary and laboratory) are listed below for reference.     Microbiology: No results found for this or any previous visit (from the past 240 hour(s)).   Labs: Basic Metabolic Panel:  Recent Labs Lab 08/09/16 1906 08/10/16 0456  NA 136 138  K 3.9 4.1  CL 99* 100*  CO2 25 29  GLUCOSE 137* 128*  BUN 25* 20  CREATININE 0.92 0.79  CALCIUM 9.5 9.2   Liver Function Tests:  Recent Labs Lab 08/09/16 1906  AST 16  ALT 22  ALKPHOS 57  BILITOT 0.7  PROT 7.7  ALBUMIN 4.0    Recent Labs Lab  08/09/16 1906  LIPASE 24   No results for input(s): AMMONIA in the last 168 hours. CBC:  Recent Labs Lab 08/09/16 1906 08/10/16 0456  WBC 21.1* 16.2*  NEUTROABS  --  9.8*  HGB 14.3 13.2  HCT 42.9 40.8  MCV 86.8 88.5  PLT 361 316   Cardiac Enzymes:  Recent Labs Lab 08/09/16 2321 08/10/16 0147 08/10/16 0456  TROPONINI <0.03 <0.03 <0.03   BNP: BNP (last 3 results) No results for input(s): BNP in the last 8760 hours.  ProBNP (last 3 results) No results for input(s): PROBNP in the last 8760 hours.  CBG:  Recent Labs Lab 08/10/16 0020 08/10/16 0448 08/10/16 0751 08/10/16 1243  GLUCAP 236* 130* 129* 141*     SIGNED: Time coordinating discharge: 30 minutes  MAGICK-Waymond Meador, MD  [facility] 08/10/2016, 2:12 PM Pager [phone]  If 7PM-7AM, please contact night-coverage [email or web address] Password [email or web address]

## 2016-08-10 NOTE — Care Management Note (Signed)
Case Management Note  Patient Details  Name: Samantha [name] MRN: [id number] Date of Birth: 25-Oct-1956  Subjective/Objective:                    Action/Plan:d/c home no needs or orders.   Expected Discharge Date:   (unknown)               Expected Discharge Plan:  Home/Self Care  In-House Referral:     Discharge planning Services  CM Consult  Post Acute Care Choice:    Choice offered to:     DME Arranged:    DME Agency:     HH Arranged:    [place] Agency:     Status of Service:  Completed, signed off  If discussed at [employer] of Stay Meetings, dates discussed:    Additional Comments:  Dessa Phi, RN 08/10/2016, 3:17 PM

## 2016-09-18 ENCOUNTER — Ambulatory Visit (INDEPENDENT_AMBULATORY_CARE_PROVIDER_SITE_OTHER): Payer: 59 | Admitting: Cardiovascular Disease

## 2016-09-18 ENCOUNTER — Encounter: Payer: Self-pay | Admitting: Cardiovascular Disease

## 2016-09-18 ENCOUNTER — Encounter (INDEPENDENT_AMBULATORY_CARE_PROVIDER_SITE_OTHER): Payer: Self-pay

## 2016-09-18 VITALS — BP 138/80 | HR 70 | Ht 69.0 in | Wt 238.4 lb

## 2016-09-18 DIAGNOSIS — I10 Essential (primary) hypertension: Secondary | ICD-10-CM | POA: Diagnosis not present

## 2016-09-18 DIAGNOSIS — I447 Left bundle-branch block, unspecified: Secondary | ICD-10-CM | POA: Diagnosis not present

## 2016-09-18 DIAGNOSIS — R0789 Other chest pain: Secondary | ICD-10-CM

## 2016-09-18 NOTE — Progress Notes (Signed)
Cardiology Office Note   Date:  09/18/2016   ID:  Samantha [name], DOB [birth year], MRN [id number]  PCP:  Helane Rima, MD  Cardiologist:   Mertie Moores, MD   Chief Complaint  Patient presents with  . Chest Pain   Problem List 1. Chest pain  2. LBBB 3. Diabetes Mellitus 4. Essential HTN    Oct. 23, 2017: Samantha [name] is a 60 y.o. female who presents for Further evaluation of chest pain. I have  seen Samantha [name] in the past. She had a heart catheterization in 2009 which revealed smooth and normal coronary arteries.  She was admitted to the hospital in September.   WBC was elevated .   No fever, no cough.  Sharp CP, radiated through to the back .  + pleuretic CP  Lasted for several days Seemed to be worse with exertion  Not changed with eating or drinking .   Echo revealed normal LV function - EF 55-60%. Grade 1 diastolic dysfunction  Abn. Septal motion due to LBBB   D-dimer was negative   Has been exercising some .   Walking some .      Past Medical History:  Diagnosis Date  . Arthritis   . Dyslipidemia   . Heart murmur 2011  . Left bundle branch block 2011  . Type II or unspecified type diabetes mellitus without mention of complication, not stated as uncontrolled   . Unspecified essential hypertension     Past Surgical History:  Procedure Laterality Date  . TONSILLECTOMY  1962     Current Outpatient Prescriptions  Medication Sig Dispense Refill  . acetaminophen (TYLENOL) 325 MG tablet Take 2 tablets (650 mg total) by mouth every 4 (four) hours as needed for headache or mild pain.    Marland Kitchen albuterol (PROVENTIL HFA;VENTOLIN HFA) 108 (90 Base) MCG/ACT inhaler Inhale 2 puffs into the lungs every 6 (six) hours as needed for wheezing or shortness of breath. 1 Inhaler 0  . aspirin 81 MG tablet Take 81 mg by mouth every morning.     . Calcium Citrate-Vitamin D (CALCIUM + D PO) Take 1 tablet by mouth every morning.     Marland Kitchen FLUoxetine (PROZAC) 20 MG capsule  Take 20 mg by mouth every morning.     Marland Kitchen glimepiride (AMARYL) 2 MG tablet Take 2 mg by mouth daily with breakfast.    . hydrochlorothiazide (MICROZIDE) 12.5 MG capsule Take 12.5 mg by mouth every morning.     Marland Kitchen lisinopril (PRINIVIL,ZESTRIL) 10 MG tablet Take 10 mg by mouth at bedtime.     . lovastatin (MEVACOR) 20 MG tablet Take 20 mg by mouth at bedtime.    . meloxicam (MOBIC) 15 MG tablet Take 15 mg by mouth daily as needed for pain.     . metFORMIN (GLUCOPHAGE) 1000 MG tablet Take 1,000 mg by mouth 2 (two) times daily with a meal.    . nitroGLYCERIN (NITROSTAT) 0.4 MG SL tablet Place 1 tablet (0.4 mg total) under the tongue every 5 (five) minutes as needed for chest pain. 30 tablet 12  . Omega-3 Fatty Acids (FISH OIL) 1000 MG CAPS Take 1,000 mg by mouth every morning.     Marland Kitchen oxyCODONE-acetaminophen (PERCOCET/ROXICET) 5-325 MG tablet Take 1 tablet by mouth every 6 (six) hours as needed for moderate pain or severe pain.     . saxagliptin HCl (ONGLYZA) 2.5 MG TABS tablet Take 5 mg by mouth every morning.     . sitaGLIPtin (JANUVIA) 100 MG tablet Take  100 mg by mouth every morning.    . verapamil (VERELAN PM) 240 MG 24 hr capsule Take 240 mg by mouth every morning.      No current facility-administered medications for this visit.     Allergies:   Iohexol    Social History:  The patient  reports that she has never smoked. She has never used smokeless tobacco. She reports that she does not drink alcohol or use drugs.   Family History:  The patient's family history includes CAD in her brother, father, and mother; Diabetes in her brother, father, mother, and sister; Hyperlipidemia in her brother; Hypertension in her brother, father, mother, and sister.    ROS:  Please see the history of present illness.    Review of Systems: Constitutional:  denies fever, chills, diaphoresis, appetite change and fatigue.  HEENT: denies photophobia, eye pain, redness, hearing loss, ear pain, congestion, sore  throat, rhinorrhea, sneezing, neck pain, neck stiffness and tinnitus.  Respiratory: denies SOB, DOE, cough, chest tightness, and wheezing.  Cardiovascular: denies chest pain, palpitations and leg swelling.  Gastrointestinal: denies nausea, vomiting, abdominal pain, diarrhea, constipation, blood in stool.  Genitourinary: denies dysuria, urgency, frequency, hematuria, flank pain and difficulty urinating.  Musculoskeletal: denies  myalgias, back pain, joint swelling, arthralgias and gait problem.   Skin: denies pallor, rash and wound.  Neurological: denies dizziness, seizures, syncope, weakness, light-headedness, numbness and headaches.   Hematological: denies adenopathy, easy bruising, personal or family bleeding history.  Psychiatric/ Behavioral: denies suicidal ideation, mood changes, confusion, nervousness, sleep disturbance and agitation.       All other systems are reviewed and negative.    PHYSICAL EXAM: VS:  BP 138/80 (BP Location: Right Arm, Patient Position: Sitting, Cuff Size: Large)   Pulse 70   Ht 5\' 9"  (1.753 m)   Wt 238 lb 6.4 oz (108.1 kg)   BMI 35.21 kg/m  , BMI Body mass index is 35.21 kg/m. GEN: Well nourished, well developed, in no acute distress  HEENT: normal  Neck: no JVD, carotid bruits, or masses Cardiac: RRR; no murmurs, rubs, or gallops,no edema  Respiratory:  clear to auscultation bilaterally, normal work of breathing GI: soft, nontender, nondistended, + BS MS: no deformity or atrophy  Skin: warm and dry, no rash Neuro:  Strength and sensation are intact Psych: normal   EKG:  EKG is not ordered today.     Recent Labs: 08/09/2016: ALT 22 08/10/2016: BUN 20; Creatinine, Ser 0.79; Hemoglobin 13.2; Platelets 316; Potassium 4.1; Sodium 138    Lipid Panel    Component Value Date/Time   CHOL 137 06/02/2010 2104   TRIG 182 (H) 06/02/2010 2104   HDL 54 06/02/2010 2104   CHOLHDL 2.5 Ratio 06/02/2010 2104   VLDL 36 06/02/2010 2104   LDLCALC 47  06/02/2010 2104      Wt Readings from Last 3 Encounters:  09/18/16 238 lb 6.4 oz (108.1 kg)  08/09/16 239 lb 3.2 oz (108.5 kg)  02/28/16 242 lb 6.4 oz (110 kg)      Other studies Reviewed: Additional studies/ records that were reviewed today include: . Review of the above records demonstrates:    ASSESSMENT AND PLAN:  1.  Chest pain: I have seen Lisbeth in the past. We have performed a cardiac catheter station in 2006 and again in 2009. She had normal coronaries on both of those heart cavitations. The initial heart catheterization showed a transient thrombus in her left circumflex flexor artery but this resolved with IV heparin. She  did not require intervention or thrombectomy.  Recently admitted to the hospital with rather atypical chest pain. The pain was pleuritic. It lasted for about a day and her troponin levels were negative. She has a left bundle-branch block but no additional changes.  At this point I do not think that she needs any further evaluation. We have performed a stress testing in the past. I do not have the results but it is likely that she had an abnormality which then resulted in [place] proceeding to heart catheterization. I suspect that further Myoview testing would also be abnormal.  Clinically she is doing very well and has not had any recurrent chest pain. She exercises twice a week.  We will continue to follow her clinically. I've asked her to exercise on a regular basis. If she has any additional problems she will give me a call. Continue to follow her and we will proceed with further testing only if she has more chest discomfort   Current medicines are reviewed at length with the patient today.  The patient does not have concerns regarding medicines.  Labs/ tests ordered today include:  No orders of the defined types were placed in this encounter.    Disposition:   FU with me in 3 months       Mertie Moores, MD  09/18/2016 9:13 AM    [facility] [place], [place],  [place]  [address] Phone: [phone]; Fax: [phone]   [facility]  [address] [place], [place]  [address] [phone]   Fax [phone]

## 2016-09-18 NOTE — Patient Instructions (Signed)
Medication Instructions:  Your physician recommends that you continue on your current medications as directed. Please refer to the Current Medication list given to you today.   Labwork: None Ordered   Testing/Procedures: None Ordered   Follow-Up: Your physician recommends that you schedule a follow-up appointment in: 3 months with Dr. Nahser   If you need a refill on your cardiac medications before your next appointment, please call your pharmacy.   Thank you for choosing [facility]! Nancyann Cotterman, RN [phone]    

## 2016-11-18 ENCOUNTER — Ambulatory Visit (INDEPENDENT_AMBULATORY_CARE_PROVIDER_SITE_OTHER): Payer: Self-pay | Admitting: Urgent Care

## 2016-11-18 VITALS — BP 146/80 | HR 88 | Temp 98.3°F | Resp 17 | Ht 69.0 in | Wt 241.0 lb

## 2016-11-18 DIAGNOSIS — R059 Cough, unspecified: Secondary | ICD-10-CM

## 2016-11-18 DIAGNOSIS — R05 Cough: Secondary | ICD-10-CM

## 2016-11-18 DIAGNOSIS — J329 Chronic sinusitis, unspecified: Secondary | ICD-10-CM

## 2016-11-18 MED ORDER — AMOXICILLIN-POT CLAVULANATE 500-125 MG PO TABS: 1.0000 | ORAL_TABLET | Freq: Two times a day (BID) | ORAL | 0 refills | Status: DC

## 2016-11-18 MED ORDER — AMOXICILLIN 500 MG PO CAPS: 500.0000 mg | ORAL_CAPSULE | Freq: Two times a day (BID) | ORAL | 0 refills | Status: DC

## 2016-11-18 MED ORDER — HYDROCODONE-HOMATROPINE 5-1.5 MG/5ML PO SYRP: 5.0000 mL | ORAL_SOLUTION | Freq: Every evening | ORAL | 0 refills | Status: DC | PRN

## 2016-11-18 NOTE — Patient Instructions (Addendum)
Sinusitis, Adult Sinusitis is soreness and inflammation of your sinuses. Sinuses are hollow spaces in the bones around your face. Your sinuses are located:  Around your eyes.  In the middle of your forehead.  Behind your nose.  In your cheekbones. Your sinuses and nasal passages are lined with a stringy fluid (mucus). Mucus normally drains out of your sinuses. When your nasal tissues become inflamed or swollen, the mucus can become trapped or blocked so air cannot flow through your sinuses. This allows bacteria, viruses, and funguses to grow, which leads to infection. Sinusitis can develop quickly and last for 7?10 days (acute) or for more than 12 weeks (chronic). Sinusitis often develops after a cold. What are the causes? This condition is caused by anything that creates swelling in the sinuses or stops mucus from draining, including:  Allergies.  Asthma.  Bacterial or viral infection.  Abnormally shaped bones between the nasal passages.  Nasal growths that contain mucus (nasal polyps).  Narrow sinus openings.  Pollutants, such as chemicals or irritants in the air.  A foreign object stuck in the nose.  A fungal infection. This is rare. What increases the risk? The following factors may make you more likely to develop this condition:  Having allergies or asthma.  Having had a recent cold or respiratory tract infection.  Having structural deformities or blockages in your nose or sinuses.  Having a weak immune system.  Doing a lot of swimming or diving.  Overusing nasal sprays.  Smoking. What are the signs or symptoms? The main symptoms of this condition are pain and a feeling of pressure around the affected sinuses. Other symptoms include:  Upper toothache.  Earache.  Headache.  Bad breath.  Decreased sense of smell and taste.  A cough that may get worse at night.  Fatigue.  Fever.  Thick drainage from your nose. The drainage is often green and it may  contain pus (purulent).  Stuffy nose or congestion.  Postnasal drip. This is when extra mucus collects in the throat or back of the nose.  Swelling and warmth over the affected sinuses.  Sore throat.  Sensitivity to light. How is this diagnosed? This condition is diagnosed based on symptoms, a medical history, and a physical exam. To find out if your condition is acute or chronic, your health care provider may:  Look in your nose for signs of nasal polyps.  Tap over the affected sinus to check for signs of infection.  View the inside of your sinuses using an imaging device that has a light attached (endoscope). If your health care provider suspects that you have chronic sinusitis, you may also:  Be tested for allergies.  Have a sample of mucus taken from your nose (nasal culture) and checked for bacteria.  Have a mucus sample examined to see if your sinusitis is related to an allergy. If your sinusitis does not respond to treatment and it lasts longer than 8 weeks, you may have an MRI or CT scan to check your sinuses. These scans also help to determine how severe your infection is. In rare cases, a bone biopsy may be done to rule out more serious types of fungal sinus disease. How is this treated? Treatment for sinusitis depends on the cause and whether your condition is chronic or acute. If a virus is causing your sinusitis, your symptoms will go away on their own within 10 days. You may be given medicines to relieve your symptoms, including:  Topical nasal decongestants. They   symptoms, including:  Topical nasal decongestants. They shrink swollen nasal passages and let mucus drain from your sinuses.  Antihistamines. These drugs block inflammation that is triggered by allergies. This can help to ease swelling in your nose and sinuses.  Topical nasal corticosteroids. These are nasal sprays that ease inflammation and swelling in your nose and sinuses.  Nasal saline washes. These rinses can help to get rid of thick mucus  in your nose.  If your condition is caused by bacteria, you will be given an antibiotic medicine. If your condition is caused by a fungus, you will be given an antifungal medicine. Surgery may be needed to correct underlying conditions, such as narrow nasal passages. Surgery may also be needed to remove polyps. Follow these instructions at home: Medicines  Take, use, or apply over-the-counter and prescription medicines only as told by your health care provider. These may include nasal sprays.  If you were prescribed an antibiotic medicine, take it as told by your health care provider. Do not stop taking the antibiotic even if you start to feel better. Hydrate and Humidify  Drink enough water to keep your urine clear or pale yellow. Staying hydrated will help to thin your mucus.  Use a cool mist humidifier to keep the humidity level in your home above 50%.  Inhale steam for 10-15 minutes, 3-4 times a day or as told by your health care provider. You can do this in the bathroom while a hot shower is running.  Limit your exposure to cool or dry air. Rest  Rest as much as possible.  Sleep with your head raised (elevated).  Make sure to get enough sleep each night. General instructions  Apply a warm, moist washcloth to your face 3-4 times a day or as told by your health care provider. This will help with discomfort.  Wash your hands often with soap and water to reduce your exposure to viruses and other germs. If soap and water are not available, use hand sanitizer.  Do not smoke. Avoid being around people who are smoking (secondhand smoke).  Keep all follow-up visits as told by your health care provider. This is important. Contact a health care provider if:  You have a fever.  Your symptoms get worse.  Your symptoms do not improve within 10 days. Get help right away if:  You have a severe headache.  You have persistent vomiting.  You have pain or swelling around your face or  eyes.  You have vision problems.  You develop confusion.  Your neck is stiff.  You have trouble breathing. This information is not intended to replace advice given to you by your health care provider. Make sure you discuss any questions you have with your health care provider. Document Released: 11/13/2005 Document Revised: 07/09/2016 Document Reviewed: 09/08/2015 Elsevier Interactive Patient Education  2017 [employer]    IF you received an x-ray today, you will receive an invoice from [facility]. Please contact [facility] at [phone] with questions or concerns regarding your invoice.   IF you received labwork today, you will receive an invoice from [facility]. Please contact [facility] at [phone] with questions or concerns regarding your invoice.   Our billing staff will not be able to assist you with questions regarding bills from these companies.  You will be contacted with the lab results as soon as they are available. The fastest way to get your results is to activate your My Chart account. Instructions are located on the last page   heard from [place] regarding the results in 2 weeks, please contact this office.

## 2016-11-18 NOTE — Progress Notes (Signed)
    MRN: [id number] DOB: 16-Nov-1956  Subjective:   Samantha [name] is a 60 y.o. female presenting for chief complaint of Sinus Problem ("over a month")  Reports 4 week history sinus pain, upper tooth pain, bilateral ear pain, R>L, dry cough. She had fever and body aches initially. Has tried cough drops, nasal sprays. Was seen by PCP's associate, Dr. Ouida Sills at the end of November. Denies chest pain, shob, n/v, abdominal pain, rashes. Denies history of asthma. Denies smoking cigarettes.   Samantha [name] has a current medication list which includes the following prescription(s): acetaminophen, albuterol, aspirin, azelastine, benzonatate, calcium citrate-vitamin d, fluoxetine, fluticasone, glimepiride, hydrochlorothiazide, lisinopril, lovastatin, meloxicam, metformin, nitroglycerin, fish oil, oxycodone-acetaminophen, saxagliptin hcl, sitagliptin, and verapamil. Also is allergic to iohexol.  [name]  has a past medical history of Arthritis; Dyslipidemia; Heart murmur (2011); Left bundle branch block (2011); Type II or unspecified type diabetes mellitus without mention of complication, not stated as uncontrolled; and Unspecified essential hypertension. Also  has a past surgical history that includes Tonsillectomy (1962).  Objective:   Vitals: BP (!) 146/80 (BP Location: Right Arm, Patient Position: Sitting, Cuff Size: Large)   Pulse 88   Temp 98.3 F (36.8 C) (Oral)   Resp 17   Ht 5\' 9"  (1.753 m)   Wt 241 lb (109.3 kg)   SpO2 95%   BMI 35.59 kg/m   Physical Exam  Constitutional: She is oriented to person, place, and time. She appears well-developed and well-nourished.  HENT:  Nasal turbinates erythematous with right-sided maxillary sinus tenderness. Mild postnasal drip present but without oropharyngeal exudates, erythema or abscesses.   Eyes: Right eye exhibits no discharge. Left eye exhibits no discharge.  Neck: Normal range of motion. Neck supple.  Cardiovascular: Normal rate, regular  rhythm and intact distal pulses.  Exam reveals no gallop and no friction rub.   No murmur heard. Pulmonary/Chest: No respiratory distress. She has no wheezes. She has no rales.  Lymphadenopathy:    She has no cervical adenopathy (R>L).  Neurological: She is alert and oriented to person, place, and time.  Skin: Skin is warm and dry.    Assessment and Plan :   1. Sinusitis, unspecified chronicity, unspecified location 2. Cough - Start Augmentin, use cough suppression medications. RTC in 1 week if no improvement.  [name], PA-C [facility] [phone] 11/18/2016 12:29 PM

## 2016-12-19 ENCOUNTER — Ambulatory Visit: Payer: 59 | Admitting: Cardiovascular Disease

## 2017-06-11 DIAGNOSIS — E669 Obesity, unspecified: Secondary | ICD-10-CM | POA: Insufficient documentation

## 2017-07-04 ENCOUNTER — Ambulatory Visit
Admission: RE | Admit: 2017-07-04 | Discharge: 2017-07-04 | Disposition: A | Discharge status: Home / Self Care | Payer: No Typology Code available for payment source | Source: Ambulatory Visit | Attending: Nurse Practitioner | Admitting: Nurse Practitioner

## 2017-07-04 ENCOUNTER — Other Ambulatory Visit: Payer: Self-pay | Admitting: Nurse Practitioner

## 2017-07-04 DIAGNOSIS — R059 Cough, unspecified: Secondary | ICD-10-CM

## 2017-07-04 DIAGNOSIS — R0789 Other chest pain: Secondary | ICD-10-CM

## 2017-07-04 DIAGNOSIS — R05 Cough: Secondary | ICD-10-CM

## 2017-07-05 ENCOUNTER — Emergency Department (HOSPITAL_COMMUNITY): Payer: No Typology Code available for payment source

## 2017-07-05 ENCOUNTER — Emergency Department (HOSPITAL_COMMUNITY)
Admission: EM | Admit: 2017-07-05 | Discharge: 2017-07-05 | Disposition: A | Discharge status: Home / Self Care | Payer: No Typology Code available for payment source | Attending: Emergency Medicine | Admitting: Emergency Medicine

## 2017-07-05 ENCOUNTER — Encounter (HOSPITAL_COMMUNITY): Payer: Self-pay

## 2017-07-05 DIAGNOSIS — Z79899 Other long term (current) drug therapy: Secondary | ICD-10-CM | POA: Insufficient documentation

## 2017-07-05 DIAGNOSIS — I10 Essential (primary) hypertension: Secondary | ICD-10-CM | POA: Insufficient documentation

## 2017-07-05 DIAGNOSIS — E785 Hyperlipidemia, unspecified: Secondary | ICD-10-CM | POA: Insufficient documentation

## 2017-07-05 DIAGNOSIS — E119 Type 2 diabetes mellitus without complications: Secondary | ICD-10-CM | POA: Insufficient documentation

## 2017-07-05 DIAGNOSIS — R55 Syncope and collapse: Secondary | ICD-10-CM | POA: Insufficient documentation

## 2017-07-05 DIAGNOSIS — R6889 Other general symptoms and signs: Secondary | ICD-10-CM

## 2017-07-05 DIAGNOSIS — R05 Cough: Secondary | ICD-10-CM | POA: Insufficient documentation

## 2017-07-05 DIAGNOSIS — Z7982 Long term (current) use of aspirin: Secondary | ICD-10-CM | POA: Insufficient documentation

## 2017-07-05 DIAGNOSIS — Z7984 Long term (current) use of oral hypoglycemic drugs: Secondary | ICD-10-CM | POA: Insufficient documentation

## 2017-07-05 DIAGNOSIS — J40 Bronchitis, not specified as acute or chronic: Secondary | ICD-10-CM | POA: Insufficient documentation

## 2017-07-05 LAB — CBC WITH DIFFERENTIAL/PLATELET
Basophils Absolute: 0.1 10*3/uL (ref 0.0–0.1)
Basophils Relative: 1 %
Eosinophils Absolute: 0.4 10*3/uL (ref 0.0–0.7)
Eosinophils Relative: 3 %
HCT: 39.2 % (ref 36.0–46.0)
Hemoglobin: 12.7 g/dL (ref 12.0–15.0)
Lymphocytes Relative: 28 %
Lymphs Abs: 3.7 10*3/uL (ref 0.7–4.0)
MCH: 27.9 pg (ref 26.0–34.0)
MCHC: 32.4 g/dL (ref 30.0–36.0)
MCV: 86 fL (ref 78.0–100.0)
Monocytes Absolute: 0.7 10*3/uL (ref 0.1–1.0)
Monocytes Relative: 6 %
Neutro Abs: 8.3 10*3/uL — ABNORMAL HIGH (ref 1.7–7.7)
Neutrophils Relative %: 62 %
Platelets: 267 10*3/uL (ref 150–400)
RBC: 4.56 MIL/uL (ref 3.87–5.11)
RDW: 14 % (ref 11.5–15.5)
WBC: 13.2 10*3/uL — ABNORMAL HIGH (ref 4.0–10.5)

## 2017-07-05 LAB — URINALYSIS, ROUTINE W REFLEX MICROSCOPIC
Bilirubin Urine: NEGATIVE
Glucose, UA: NEGATIVE mg/dL
Hgb urine dipstick: NEGATIVE
Ketones, ur: NEGATIVE mg/dL
Leukocytes, UA: NEGATIVE
Nitrite: NEGATIVE
Protein, ur: NEGATIVE mg/dL
Specific Gravity, Urine: 1.02 (ref 1.005–1.030)
pH: 5 (ref 5.0–8.0)

## 2017-07-05 LAB — I-STAT CG4 LACTIC ACID, ED: Lactic Acid, Venous: 2.27 mmol/L (ref 0.5–1.9)

## 2017-07-05 LAB — COMPREHENSIVE METABOLIC PANEL
ALT: 24 U/L (ref 14–54)
AST: 23 U/L (ref 15–41)
Albumin: 3.2 g/dL — ABNORMAL LOW (ref 3.5–5.0)
Alkaline Phosphatase: 45 U/L (ref 38–126)
Anion gap: 11 (ref 5–15)
BUN: 17 mg/dL (ref 6–20)
CO2: 24 mmol/L (ref 22–32)
Calcium: 8.7 mg/dL — ABNORMAL LOW (ref 8.9–10.3)
Chloride: 104 mmol/L (ref 101–111)
Creatinine, Ser: 0.84 mg/dL (ref 0.44–1.00)
GFR calc Af Amer: >60 mL/min (ref >60)
GFR calc non Af Amer: >60 mL/min (ref >60)
Glucose, Bld: 135 mg/dL — ABNORMAL HIGH (ref 65–99)
Potassium: 4.3 mmol/L (ref 3.5–5.1)
Sodium: 139 mmol/L (ref 135–145)
Total Bilirubin: 0.6 mg/dL (ref 0.3–1.2)
Total Protein: 6.3 g/dL — ABNORMAL LOW (ref 6.5–8.1)

## 2017-07-05 MED ORDER — PREDNISONE 20 MG PO TABS
60.0000 mg | ORAL_TABLET | Freq: Once | ORAL | Status: AC
  Administered 2017-07-05: 60 mg via ORAL
  Filled 2017-07-05: qty 3

## 2017-07-05 MED ORDER — SODIUM CHLORIDE 0.9 % IV SOLN
INTRAVENOUS | Status: DC
  Administered 2017-07-05: 16:00:00 via INTRAVENOUS

## 2017-07-05 MED ORDER — SODIUM CHLORIDE 0.9 % IV BOLUS (SEPSIS)
1000.0000 mL | Freq: Once | INTRAVENOUS | Status: AC
  Administered 2017-07-05: 1000 mL via INTRAVENOUS

## 2017-07-05 MED ORDER — PREDNISONE 10 MG PO TABS: 40.0000 mg | ORAL_TABLET | Freq: Every day | ORAL | 0 refills | Status: DC

## 2017-07-05 NOTE — ED Provider Notes (Signed)
[facility] DEPT Provider Note   CSN: [id number] Arrival date & time: 07/05/17  1445     History   Chief Complaint Chief Complaint  Patient presents with  . Near Syncope    HPI Samantha [name] is a 61 y.o. female.  Patient has been struggling with the upper respiratory infection or possibly pneumonia since July 21. Patient on 3 different courses of antibiotics. On August 8 started Levaquin. No improvement on these antibiotics. Patient had chest x-ray August 1 which she was told was consistent with pneumonia. Patient has CT scan of the chest yesterday does not know the results.   PATIENT SYMPTOMS HAVE BEEN DOMINANTLY BEEN NONPRODUCTIVE COUGH FATIGUE DIAPHORESIS SHORTNESS OF BREATH SOME CONGESTION SOME BODYACHES> Nausea. No diarrhea no vomiting.  Today patient had a near syncopal episode she did not pass out. She was out having pictures with her grandchild and she got very lightheaded and thought she was going to pass out.      Past Medical History:  Diagnosis Date  . Arthritis   . Dyslipidemia   . Heart murmur 2011  . Left bundle branch block 2011  . Type II or unspecified type diabetes mellitus without mention of complication, not stated as uncontrolled   . Unspecified essential hypertension     Patient Active Problem List   Diagnosis Date Noted  . LBBB (left bundle branch block) 08/10/2016  . Leukocytosis 08/10/2016  . Hyperlipidemia 08/10/2016  . Essential hypertension 08/10/2016  . Pain in the chest 08/09/2016  . Diabetes ([place]) 11/20/2015    Past Surgical History:  Procedure Laterality Date  . TONSILLECTOMY  1962    OB History    No data available       Home Medications    Prior to Admission medications   Medication Sig Start Date End Date Taking? Authorizing Provider  acetaminophen (TYLENOL) 325 MG tablet Take 2 tablets (650 mg total) by mouth every 4 (four) hours as needed for headache or mild pain. 08/10/16  Yes Theodis Blaze, MD  albuterol  (PROVENTIL HFA;VENTOLIN HFA) 108 (90 Base) MCG/ACT inhaler Inhale 2 puffs into the lungs every 6 (six) hours as needed for wheezing or shortness of breath. 11/27/15  Yes Leandrew Koyanagi, MD  aspirin 81 MG tablet Take 81 mg by mouth every morning.    Yes [provider]  azelastine (ASTELIN) 0.1 % nasal spray Place into both nostrils daily as needed. Use in each nostril as directed    Yes [provider]  benzonatate (TESSALON) 100 MG capsule Take by mouth 3 (three) times daily as needed for cough.   Yes [provider]  Calcium Citrate-Vitamin D (CALCIUM + D PO) Take 1 tablet by mouth every morning.    Yes [provider]  FLUoxetine (PROZAC) 20 MG capsule Take 20 mg by mouth every morning.    Yes [provider]  glimepiride (AMARYL) 2 MG tablet Take 2 mg by mouth 2 (two) times daily.    Yes [provider]  hydrochlorothiazide (MICROZIDE) 12.5 MG capsule Take 12.5 mg by mouth every morning.    Yes [provider]  ipratropium-albuterol (DUONEB) 0.5-2.5 (3) MG/3ML SOLN Inhale 3 mLs into the lungs daily as needed. 06/25/17  Yes [provider]  levofloxacin (LEVAQUIN) 750 MG tablet Take 750 mg by mouth daily. 07/04/17 07/11/17 Yes [provider]  lisinopril (PRINIVIL,ZESTRIL) 10 MG tablet Take 10 mg by mouth at bedtime.    Yes [provider]  lovastatin (MEVACOR) 20  MG tablet Take 20 mg by mouth at bedtime.   Yes [provider]  meloxicam (MOBIC) 15 MG tablet Take 15 mg by mouth daily as needed for pain.  07/29/16  Yes [provider]  metFORMIN (GLUCOPHAGE) 1000 MG tablet Take 1,000 mg by mouth 2 (two) times daily with a meal.   Yes [provider]  Omega-3 Fatty Acids (FISH OIL) 1000 MG CAPS Take 1,000 mg by mouth every morning.    Yes [provider]  oxyCODONE-acetaminophen (PERCOCET/ROXICET) 5-325 MG tablet Take 1 tablet by mouth every 6 (six) hours as needed for moderate  pain or severe pain.  06/27/16  Yes [provider]  saxagliptin HCl (ONGLYZA) 2.5 MG TABS tablet Take 2.5 mg by mouth daily.   Yes [provider]  verapamil (VERELAN PM) 240 MG 24 hr capsule Take 240 mg by mouth every morning.    Yes [provider]  amoxicillin (AMOXIL) 500 MG capsule Take 1 capsule (500 mg total) by mouth 2 (two) times daily with a meal. Patient not taking: Reported on 07/05/2017 11/18/16   [name], PA-C  amoxicillin-clavulanate (AUGMENTIN) 500-125 MG tablet Take 1 tablet (500 mg total) by mouth 2 (two) times daily with a meal. Patient not taking: Reported on 07/05/2017 11/18/16   [name], PA-C  doxycycline (VIBRA-TABS) 100 MG tablet Take 100 mg by mouth 2 (two) times daily. 06/25/17 07/05/17  [provider]  HYDROcodone-homatropine (HYCODAN) 5-1.5 MG/5ML syrup Take 5 mLs by mouth at bedtime as needed. Patient not taking: Reported on 07/05/2017 11/18/16   [name], PA-C  nitroGLYCERIN (NITROSTAT) 0.4 MG SL tablet Place 1 tablet (0.4 mg total) under the tongue every 5 (five) minutes as needed for chest pain. 08/10/16   Theodis Blaze, MD  predniSONE (DELTASONE) 10 MG tablet Take 4 tablets (40 mg total) by mouth daily. 07/05/17   Fredia Sorrow, MD    Family History Family History  Problem Relation Age of Onset  . Diabetes Mother   . Hypertension Mother   . CAD Mother   . Hypertension Father   . Diabetes Father   . CAD Father   . Diabetes Brother   . Hyperlipidemia Brother   . Hypertension Brother   . CAD Brother   . Diabetes Sister   . Hypertension Sister     Social History Social History  Substance Use Topics  . Smoking status: Never Smoker  . Smokeless tobacco: Never Used  . Alcohol use No     Allergies   Iohexol   Review of Systems Review of Systems  Constitutional: Positive for diaphoresis, fatigue and fever.  HENT: Positive for congestion.   Eyes: Negative for redness.  Respiratory: Positive for cough and  shortness of breath.   Cardiovascular: Negative for chest pain.  Gastrointestinal: Positive for nausea. Negative for abdominal pain, diarrhea and vomiting.  Genitourinary: Negative for dysuria.  Musculoskeletal: Positive for myalgias. Negative for back pain.  Neurological: Negative for headaches.  Hematological: Does not bruise/bleed easily.  Psychiatric/Behavioral: Negative for confusion.     Physical Exam Updated Vital Signs BP (!) 128/54   Pulse 66   Temp 97.7 F (36.5 C) (Oral)   Resp 20   Ht 1.778 m (5\' 10" )   Wt 108 kg (238 lb)   SpO2 93%   BMI 34.15 kg/m   Physical Exam  Constitutional: She is oriented to person, place, and time. She appears well-developed and well-nourished. No distress.  HENT:  Head: Normocephalic and atraumatic.  Mouth/Throat: Oropharynx is clear and moist.  Eyes: Pupils are equal, round, and reactive to light. Conjunctivae and EOM are normal.  Neck: Normal range of motion. Neck supple.  Cardiovascular: Normal rate, regular rhythm and normal heart sounds.   Pulmonary/Chest: Effort normal and breath sounds normal. No respiratory distress. She has no wheezes. She has no rales.  Abdominal: Soft. Bowel sounds are normal. There is no tenderness.  Musculoskeletal: Normal range of motion.  Neurological: She is alert and oriented to person, place, and time. No cranial nerve deficit or sensory deficit. She exhibits normal muscle tone. Coordination normal.  Skin: Skin is warm. No rash noted.  Nursing note and vitals reviewed.    ED Treatments / Results  Labs (all labs ordered are listed, but only abnormal results are displayed) Labs Reviewed  COMPREHENSIVE METABOLIC PANEL - Abnormal; Notable for the following:       Result Value   Glucose, Bld 135 (*)    Calcium 8.7 (*)    Total Protein 6.3 (*)    Albumin 3.2 (*)    All other components within normal limits  CBC WITH DIFFERENTIAL/PLATELET - Abnormal; Notable for the following:    WBC 13.2 (*)     Neutro Abs 8.3 (*)    All other components within normal limits  I-STAT CG4 LACTIC ACID, ED - Abnormal; Notable for the following:    Lactic Acid, Venous 2.27 (*)    All other components within normal limits  URINALYSIS, ROUTINE W REFLEX MICROSCOPIC    EKG  EKG Interpretation  Date/Time:  Thursday July 05 2017 14:51:35 EDT Ventricular Rate:  73 PR Interval:    QRS Duration: 142 QT Interval:  440 QTC Calculation: 485 R Axis:   6 Text Interpretation:  Sinus rhythm Left bundle branch block Baseline wander in lead(s) III V2 V3 V4 Confirmed by Fredia Sorrow [phone]) on 07/05/2017 3:49:35 PM       Radiology Ct Chest Wo Contrast  Result Date: 07/05/2017 CLINICAL DATA:  Cough, chest pain. EXAM: CT CHEST WITHOUT CONTRAST TECHNIQUE: Multidetector CT imaging of the chest was performed following the standard protocol without IV contrast. COMPARISON:  Radiographs of August 09, 2016. FINDINGS: Cardiovascular: No evidence of thoracic aortic aneurysm. Atherosclerosis of thoracic aorta is noted. Normal heart size. No pericardial effusion. Mediastinum/Nodes: No enlarged mediastinal or axillary lymph nodes. Thyroid gland, trachea, and esophagus demonstrate no significant findings. Lungs/Pleura: No pneumothorax or pleural effusion is noted. Minimal left basilar subsegmental atelectasis is noted. 4 mm nodule is seen just above the right hemidiaphragm best seen on image number 100 of series 5. Upper Abdomen: No acute abnormality. Musculoskeletal: No chest wall mass or suspicious bone lesions identified. IMPRESSION: Minimal left basilar subsegmental atelectasis. 4 mm nodule seen in right lower lobe. No follow-up needed if patient is low-risk. Non-contrast chest CT can be considered in 12 months if patient is high-risk. This recommendation follows the consensus statement: Guidelines for Management of Incidental Pulmonary Nodules Detected on CT Images: From the Fleischner Society 2017; Radiology 2017;  284:228-243. Aortic Atherosclerosis (ICD10-I70.0). Electronically Signed   By: Marijo Conception, M.D.   On: 07/05/2017 08:25   Dg Chest Portable 1 View  Result Date: 07/05/2017 CLINICAL DATA:  Acute cough and rhonchi.  Near syncope. EXAM: PORTABLE CHEST 1 VIEW COMPARISON:  07/04/2017 CT and 08/09/2016 chest radiograph FINDINGS: Cardiomediastinal silhouette is unchanged. Elevation of the left hemidiaphragm is again noted. There is no evidence of focal airspace disease, pulmonary edema, suspicious pulmonary nodule/mass, pleural effusion, or  pneumothorax. No acute bony abnormalities are identified. IMPRESSION: No evidence of acute cardiopulmonary disease. Electronically Signed   By: Margarette Canada M.D.   On: 07/05/2017 15:08    Procedures Procedures (including critical care time)  Medications Ordered in ED Medications  0.9 %  sodium chloride infusion ( Intravenous Stopped 07/05/17 1745)  sodium chloride 0.9 % bolus 1,000 mL (0 mLs Intravenous Stopped 07/05/17 1712)  predniSONE (DELTASONE) tablet 60 mg (60 mg Oral Given 07/05/17 1742)     Initial Impression / Assessment and Plan / ED Course  I have reviewed the triage vital signs and the nursing notes.  Pertinent labs & imaging results that were available during my care of the patient were reviewed by me and considered in my medical decision making (see chart for details).     Patient with a complaint of upper respiratory type infection with cough and initially fevers since July 21. Patient has been followed by primary care provider and has been on several courses of antibiotics. First antibiotic was started on July 25 and that was Zithromax. Patient had Second course of the antibiotics was July 30 and that was doxycycline again no improvement. On August 1 chest x-ray was done patient was told it was consistent with pneumonia. On August 8 patient was changed to Levaquin because there was no improvement. Also early on patient had a course of prednisone.  Patient had CT of the chest done yesterday but patient does not know the results.  CT results were negative for pneumonia. Patient was ambulatory here off oxygen without any hypoxia no significant shortness of breath. Patient's symptoms of a consistent with a flulike illness with shortness of breath cough fatigue diaphoresis. And nausea at times. No diarrhea.  Based on 9 yesterday CT scan this should be a bronchitis or a viral illness. Unable to state whether there was a pneumonia component at one point in time. Obviously no pneumonia today. Patient received 1 L of fluids which made her feel better. As stated she was able to ambulate without hypoxia. Patient already on Mucinex DM patient is on an albuterol inhaler patient is currently taking Levaquin. We'll have her continue that since she is already on it. Will have her initiate another course of prednisone.  Patient overall appears well. Also when she ambulated there was no lightheadedness or near syncope feeling. Patient stable for discharge home.  Final Clinical Impressions(s) / ED Diagnoses   Final diagnoses:  Near syncope  Bronchitis    New Prescriptions Discharge Medication List as of 07/05/2017  5:33 PM    START taking these medications   Details  predniSONE (DELTASONE) 10 MG tablet Take 4 tablets (40 mg total) by mouth daily., Starting Thu 07/05/2017, Print         Fredia Sorrow, MD 07/05/17 [phone]

## 2017-07-05 NOTE — ED Notes (Signed)
Portable in room.  

## 2017-07-05 NOTE — ED Notes (Signed)
Pt ambulated to end of hallway and back, spo2 remained at 94% and above and HR steady in the 70s. Pt did have to stop one time due to fatigue but denied shob.

## 2017-07-05 NOTE — ED Triage Notes (Signed)
Per ems, pt from home. Pt had witnessed near syncopal episode where she was standing with family talking and then leaned against the car and slid to the ground. Family states that pt never lost consciousness but became diaphoretic and pale. Pt has been under treatment by her pcp for pneumonia x 4 weeks with oral antibiotics. Pt has dry cough and rhonchi. VSS. Pt AxOx4.

## 2017-07-05 NOTE — ED Notes (Signed)
Pt verbalized understanding of d/c instructions and has no further questions. Pt stable, VSS, NAD. Pt d/c home on prednisone and continue to take antibiotics at home.

## 2017-07-05 NOTE — ED Notes (Signed)
Dr Rogene Houston asked if second lactic acid was needed and he said no, based on pt's VS and blood work we are not worried about sepsis.

## 2017-07-05 NOTE — Discharge Instructions (Signed)
A continue all your current medications.Start the prednisone for another 5 day course. Make appointment to follow-up with your doctor. As we discussed CT scan of the chest shows no evidence of pneumonia. Return for any new or worse symptoms.

## 2017-07-05 NOTE — ED Notes (Addendum)
Dr Rogene Houston ordered to ambulate pt with pulse ox. O2 has been removed and pt is staying in the upper 90s on RA.

## 2017-07-05 NOTE — ED Notes (Signed)
Lab results reported to Nurse Kevin. 

## 2017-07-12 ENCOUNTER — Inpatient Hospital Stay (HOSPITAL_COMMUNITY)
Admission: EM | Admit: 2017-07-12 | Discharge: 2017-07-16 | DRG: 193 | Disposition: A | Discharge status: Home / Self Care | Payer: Self-pay | Attending: Family Medicine | Admitting: Family Medicine

## 2017-07-12 ENCOUNTER — Encounter (HOSPITAL_COMMUNITY): Payer: Self-pay | Admitting: Emergency Medicine

## 2017-07-12 ENCOUNTER — Emergency Department (HOSPITAL_COMMUNITY): Payer: Self-pay

## 2017-07-12 DIAGNOSIS — E119 Type 2 diabetes mellitus without complications: Secondary | ICD-10-CM

## 2017-07-12 DIAGNOSIS — E669 Obesity, unspecified: Secondary | ICD-10-CM | POA: Diagnosis present

## 2017-07-12 DIAGNOSIS — Z833 Family history of diabetes mellitus: Secondary | ICD-10-CM

## 2017-07-12 DIAGNOSIS — I110 Hypertensive heart disease with heart failure: Secondary | ICD-10-CM | POA: Diagnosis present

## 2017-07-12 DIAGNOSIS — E1121 Type 2 diabetes mellitus with diabetic nephropathy: Secondary | ICD-10-CM | POA: Diagnosis present

## 2017-07-12 DIAGNOSIS — Z91041 Radiographic dye allergy status: Secondary | ICD-10-CM

## 2017-07-12 DIAGNOSIS — Z7982 Long term (current) use of aspirin: Secondary | ICD-10-CM

## 2017-07-12 DIAGNOSIS — E785 Hyperlipidemia, unspecified: Secondary | ICD-10-CM | POA: Diagnosis present

## 2017-07-12 DIAGNOSIS — I447 Left bundle-branch block, unspecified: Secondary | ICD-10-CM | POA: Diagnosis present

## 2017-07-12 DIAGNOSIS — I5031 Acute diastolic (congestive) heart failure: Secondary | ICD-10-CM | POA: Diagnosis present

## 2017-07-12 DIAGNOSIS — Z8349 Family history of other endocrine, nutritional and metabolic diseases: Secondary | ICD-10-CM

## 2017-07-12 DIAGNOSIS — E872 Acidosis: Secondary | ICD-10-CM | POA: Diagnosis present

## 2017-07-12 DIAGNOSIS — E1159 Type 2 diabetes mellitus with other circulatory complications: Secondary | ICD-10-CM | POA: Diagnosis present

## 2017-07-12 DIAGNOSIS — I152 Hypertension secondary to endocrine disorders: Secondary | ICD-10-CM | POA: Diagnosis present

## 2017-07-12 DIAGNOSIS — Z79899 Other long term (current) drug therapy: Secondary | ICD-10-CM

## 2017-07-12 DIAGNOSIS — B349 Viral infection, unspecified: Secondary | ICD-10-CM | POA: Diagnosis present

## 2017-07-12 DIAGNOSIS — E278 Other specified disorders of adrenal gland: Secondary | ICD-10-CM | POA: Diagnosis present

## 2017-07-12 DIAGNOSIS — Z6835 Body mass index (BMI) 35.0-35.9, adult: Secondary | ICD-10-CM

## 2017-07-12 DIAGNOSIS — I493 Ventricular premature depolarization: Secondary | ICD-10-CM | POA: Diagnosis present

## 2017-07-12 DIAGNOSIS — J189 Pneumonia, unspecified organism: Principal | ICD-10-CM | POA: Diagnosis present

## 2017-07-12 DIAGNOSIS — E1140 Type 2 diabetes mellitus with diabetic neuropathy, unspecified: Secondary | ICD-10-CM

## 2017-07-12 DIAGNOSIS — E1165 Type 2 diabetes mellitus with hyperglycemia: Secondary | ICD-10-CM | POA: Diagnosis present

## 2017-07-12 DIAGNOSIS — R079 Chest pain, unspecified: Secondary | ICD-10-CM

## 2017-07-12 DIAGNOSIS — Z7984 Long term (current) use of oral hypoglycemic drugs: Secondary | ICD-10-CM

## 2017-07-12 DIAGNOSIS — Z8249 Family history of ischemic heart disease and other diseases of the circulatory system: Secondary | ICD-10-CM

## 2017-07-12 DIAGNOSIS — I10 Essential (primary) hypertension: Secondary | ICD-10-CM | POA: Diagnosis present

## 2017-07-12 DIAGNOSIS — R7989 Other specified abnormal findings of blood chemistry: Secondary | ICD-10-CM

## 2017-07-12 DIAGNOSIS — E1169 Type 2 diabetes mellitus with other specified complication: Secondary | ICD-10-CM

## 2017-07-12 DIAGNOSIS — M549 Dorsalgia, unspecified: Secondary | ICD-10-CM | POA: Diagnosis present

## 2017-07-12 DIAGNOSIS — J42 Unspecified chronic bronchitis: Secondary | ICD-10-CM | POA: Diagnosis present

## 2017-07-12 LAB — BASIC METABOLIC PANEL
Anion gap: 15 (ref 5–15)
BUN: 17 mg/dL (ref 6–20)
CO2: 24 mmol/L (ref 22–32)
Calcium: 9.2 mg/dL (ref 8.9–10.3)
Chloride: 99 mmol/L — ABNORMAL LOW (ref 101–111)
Creatinine, Ser: 1.08 mg/dL — ABNORMAL HIGH (ref 0.44–1.00)
GFR calc Af Amer: >60 mL/min (ref >60)
GFR calc non Af Amer: 53 mL/min — ABNORMAL LOW (ref >60)
Glucose, Bld: 214 mg/dL — ABNORMAL HIGH (ref 65–99)
Potassium: 3.6 mmol/L (ref 3.5–5.1)
Sodium: 138 mmol/L (ref 135–145)

## 2017-07-12 LAB — GLUCOSE, CAPILLARY: Glucose-Capillary: 274 mg/dL — ABNORMAL HIGH (ref 65–99)

## 2017-07-12 LAB — CBC
HCT: 44.4 % (ref 36.0–46.0)
Hemoglobin: 14.7 g/dL (ref 12.0–15.0)
MCH: 28.5 pg (ref 26.0–34.0)
MCHC: 33.1 g/dL (ref 30.0–36.0)
MCV: 86.2 fL (ref 78.0–100.0)
Platelets: 322 10*3/uL (ref 150–400)
RBC: 5.15 MIL/uL — ABNORMAL HIGH (ref 3.87–5.11)
RDW: 14.3 % (ref 11.5–15.5)
WBC: 15.6 10*3/uL — ABNORMAL HIGH (ref 4.0–10.5)

## 2017-07-12 LAB — URINALYSIS, ROUTINE W REFLEX MICROSCOPIC
Bilirubin Urine: NEGATIVE
Glucose, UA: NEGATIVE mg/dL
Hgb urine dipstick: NEGATIVE
Ketones, ur: NEGATIVE mg/dL
Nitrite: NEGATIVE
Protein, ur: 100 mg/dL — AB
Specific Gravity, Urine: 1.027 (ref 1.005–1.030)
pH: 5 (ref 5.0–8.0)

## 2017-07-12 LAB — I-STAT CG4 LACTIC ACID, ED
Lactic Acid, Venous: 5.54 mmol/L (ref 0.5–1.9)
Lactic Acid, Venous: 3.6 mmol/L (ref 0.5–1.9)

## 2017-07-12 LAB — I-STAT TROPONIN, ED: Troponin i, poc: 0.01 ng/mL (ref 0.00–0.08)

## 2017-07-12 MED ORDER — INSULIN ASPART 100 UNIT/ML ~~LOC~~ SOLN
0.0000 [IU] | Freq: Every day | SUBCUTANEOUS | Status: DC
  Administered 2017-07-12 – 2017-07-13 (×2): 3 [IU] via SUBCUTANEOUS

## 2017-07-12 MED ORDER — FLUOXETINE HCL 20 MG PO CAPS
20.0000 mg | ORAL_CAPSULE | Freq: Every day | ORAL | Status: DC
  Administered 2017-07-13 – 2017-07-16 (×4): 20 mg via ORAL
  Filled 2017-07-12 (×4): qty 1

## 2017-07-12 MED ORDER — PREDNISONE 10 MG PO TABS
50.0000 mg | ORAL_TABLET | Freq: Four times a day (QID) | ORAL | Status: AC
  Administered 2017-07-12 – 2017-07-13 (×3): 50 mg via ORAL
  Filled 2017-07-12 (×2): qty 2
  Filled 2017-07-12: qty 1

## 2017-07-12 MED ORDER — DEXTROSE 5 % IV SOLN
500.0000 mg | Freq: Once | INTRAVENOUS | Status: AC
  Administered 2017-07-12: 500 mg via INTRAVENOUS
  Filled 2017-07-12: qty 500

## 2017-07-12 MED ORDER — VANCOMYCIN HCL 10 G IV SOLR
2000.0000 mg | Freq: Once | INTRAVENOUS | Status: AC
  Administered 2017-07-12: 2000 mg via INTRAVENOUS
  Filled 2017-07-12 (×2): qty 2000

## 2017-07-12 MED ORDER — VERAPAMIL HCL ER 240 MG PO TBCR
240.0000 mg | EXTENDED_RELEASE_TABLET | Freq: Every day | ORAL | Status: DC
  Administered 2017-07-13 – 2017-07-16 (×4): 240 mg via ORAL
  Filled 2017-07-12 (×4): qty 1

## 2017-07-12 MED ORDER — SODIUM CHLORIDE 0.9 % IV BOLUS (SEPSIS)
1000.0000 mL | Freq: Once | INTRAVENOUS | Status: AC
  Administered 2017-07-12: 1000 mL via INTRAVENOUS

## 2017-07-12 MED ORDER — HYDROCORTISONE NA SUCCINATE PF 100 MG IJ SOLR
200.0000 mg | Freq: Once | INTRAMUSCULAR | Status: AC
  Administered 2017-07-12: 200 mg via INTRAVENOUS
  Filled 2017-07-12: qty 4

## 2017-07-12 MED ORDER — DEXTROSE 5 % IV SOLN: 1.0000 g | Freq: Once | INTRAVENOUS | Status: DC

## 2017-07-12 MED ORDER — ACETAMINOPHEN 325 MG PO TABS
650.0000 mg | ORAL_TABLET | Freq: Four times a day (QID) | ORAL | Status: DC | PRN
  Administered 2017-07-12 – 2017-07-16 (×3): 650 mg via ORAL
  Filled 2017-07-12 (×3): qty 2

## 2017-07-12 MED ORDER — SODIUM CHLORIDE 0.9 % IV SOLN
INTRAVENOUS | Status: DC
  Administered 2017-07-12 – 2017-07-14 (×4): via INTRAVENOUS

## 2017-07-12 MED ORDER — ASPIRIN EC 81 MG PO TBEC
81.0000 mg | DELAYED_RELEASE_TABLET | Freq: Every day | ORAL | Status: DC
  Administered 2017-07-13 – 2017-07-16 (×4): 81 mg via ORAL
  Filled 2017-07-12 (×4): qty 1

## 2017-07-12 MED ORDER — DIPHENHYDRAMINE HCL 25 MG PO CAPS
50.0000 mg | ORAL_CAPSULE | Freq: Once | ORAL | Status: AC
  Administered 2017-07-12: 50 mg via ORAL
  Filled 2017-07-12: qty 2

## 2017-07-12 MED ORDER — DEXTROSE 5 % IV SOLN
1.0000 g | INTRAVENOUS | Status: DC
  Administered 2017-07-13 – 2017-07-15 (×3): 1 g via INTRAVENOUS
  Filled 2017-07-12 (×3): qty 10

## 2017-07-12 MED ORDER — MELOXICAM 7.5 MG PO TABS: 15.0000 mg | ORAL_TABLET | Freq: Every day | ORAL | Status: DC | PRN

## 2017-07-12 MED ORDER — ALBUTEROL SULFATE (2.5 MG/3ML) 0.083% IN NEBU: 2.5000 mg | INHALATION_SOLUTION | RESPIRATORY_TRACT | Status: DC | PRN

## 2017-07-12 MED ORDER — DEXTROSE 5 % IV SOLN
500.0000 mg | INTRAVENOUS | Status: DC
  Administered 2017-07-13 – 2017-07-14 (×2): 500 mg via INTRAVENOUS
  Filled 2017-07-12 (×3): qty 500

## 2017-07-12 MED ORDER — ONDANSETRON HCL 4 MG PO TABS: 4.0000 mg | ORAL_TABLET | Freq: Four times a day (QID) | ORAL | Status: DC | PRN

## 2017-07-12 MED ORDER — PRAVASTATIN SODIUM 20 MG PO TABS
10.0000 mg | ORAL_TABLET | Freq: Every day | ORAL | Status: DC
  Administered 2017-07-13 – 2017-07-15 (×3): 10 mg via ORAL
  Filled 2017-07-12 (×3): qty 1

## 2017-07-12 MED ORDER — HYDROCHLOROTHIAZIDE 12.5 MG PO CAPS
12.5000 mg | ORAL_CAPSULE | Freq: Every day | ORAL | Status: DC
  Administered 2017-07-13 – 2017-07-16 (×4): 12.5 mg via ORAL
  Filled 2017-07-12 (×4): qty 1

## 2017-07-12 MED ORDER — LISINOPRIL 10 MG PO TABS
10.0000 mg | ORAL_TABLET | Freq: Every day | ORAL | Status: DC
  Administered 2017-07-12 – 2017-07-15 (×4): 10 mg via ORAL
  Filled 2017-07-12 (×2): qty 1
  Filled 2017-07-12: qty 2
  Filled 2017-07-12: qty 1

## 2017-07-12 MED ORDER — ONDANSETRON HCL 4 MG/2ML IJ SOLN: 4.0000 mg | Freq: Four times a day (QID) | INTRAMUSCULAR | Status: DC | PRN

## 2017-07-12 MED ORDER — PIPERACILLIN-TAZOBACTAM 3.375 G IVPB
3.3750 g | Freq: Three times a day (TID) | INTRAVENOUS | Status: DC
  Administered 2017-07-12: 3.375 g via INTRAVENOUS
  Filled 2017-07-12: qty 50

## 2017-07-12 MED ORDER — ACETAMINOPHEN 650 MG RE SUPP: 650.0000 mg | Freq: Four times a day (QID) | RECTAL | Status: DC | PRN

## 2017-07-12 MED ORDER — INSULIN ASPART 100 UNIT/ML ~~LOC~~ SOLN
0.0000 [IU] | Freq: Three times a day (TID) | SUBCUTANEOUS | Status: DC
  Administered 2017-07-13: 7 [IU] via SUBCUTANEOUS
  Administered 2017-07-13: 5 [IU] via SUBCUTANEOUS
  Administered 2017-07-13: 3 [IU] via SUBCUTANEOUS
  Administered 2017-07-14 – 2017-07-15 (×4): 2 [IU] via SUBCUTANEOUS
  Administered 2017-07-15 (×2): 1 [IU] via SUBCUTANEOUS
  Administered 2017-07-16: 2 [IU] via SUBCUTANEOUS

## 2017-07-12 MED ORDER — VANCOMYCIN HCL IN DEXTROSE 750-5 MG/150ML-% IV SOLN
750.0000 mg | Freq: Two times a day (BID) | INTRAVENOUS | Status: DC
  Filled 2017-07-12: qty 150

## 2017-07-12 MED ORDER — SODIUM CHLORIDE 0.9 % IV BOLUS (SEPSIS)
500.0000 mL | Freq: Once | INTRAVENOUS | Status: AC
  Administered 2017-07-12: 500 mL via INTRAVENOUS

## 2017-07-12 MED ORDER — DIPHENHYDRAMINE HCL 50 MG/ML IJ SOLN
50.0000 mg | Freq: Once | INTRAMUSCULAR | Status: AC
  Filled 2017-07-12: qty 1

## 2017-07-12 MED ORDER — ENOXAPARIN SODIUM 40 MG/0.4ML ~~LOC~~ SOLN
40.0000 mg | SUBCUTANEOUS | Status: DC
  Administered 2017-07-13: 40 mg via SUBCUTANEOUS
  Filled 2017-07-12: qty 0.4

## 2017-07-12 NOTE — ED Notes (Signed)
Reports being dx with pneumonia on multiple rounds of antibiotics. Now with viral bronchitis taking Levaquin and doxy. States she feels horrible.

## 2017-07-12 NOTE — ED Triage Notes (Signed)
Pt here with left sided CP and SOB x 3 weeks; pt seen here for same last week

## 2017-07-12 NOTE — ED Notes (Signed)
Pt care assumed.  Family at bedside, pt denies any complaints.  Waiting for hospitalist for admission.

## 2017-07-12 NOTE — ED Provider Notes (Signed)
[facility] DEPT Provider Note   CSN: [id number] Arrival date & time: 07/12/17  1319     History   Chief Complaint Chief Complaint  Patient presents with  . Chest Pain    HPI Samantha [name] is a 61 y.o. female.  Patient presents for evaluation of a knifelike feeling in her lower chest, which radiates to her mid back, and is persistent, waxing and waning between 8 and 6/10, for about 4 weeks. During this time. She has been treated with multiple antibiotics for suspected pneumonia without improvement. She was evaluated with a chest CT, without contrast on 07/04/17, without findings for causative symptoms. She initially had cough which was productive of sputum, but that has resolved to a "dry cough." She feels weak and washed out and has no energy. Sometimes when standing, such as for doing dishes, she feels dizzy. She feels no better, despite multiple treatments including 2 different rounds of prednisone. She does not have a chronic respiratory disorder. She is taking her usual medicine for diabetes, with good blood sugars. She has never had blood clots previously. She has not had any pain or swelling in her legs. There are no other known modifying factors.  HPI  Past Medical History:  Diagnosis Date  . Arthritis   . Dyslipidemia   . Heart murmur 2011  . Left bundle branch block 2011  . Type II or unspecified type diabetes mellitus without mention of complication, not stated as uncontrolled   . Unspecified essential hypertension     Patient Active Problem List   Diagnosis Date Noted  . Chest pain 07/12/2017  . LBBB (left bundle branch block) 08/10/2016  . Leukocytosis 08/10/2016  . Hyperlipidemia 08/10/2016  . Essential hypertension 08/10/2016  . Pain in the chest 08/09/2016  . Diabetes ([place]) 11/20/2015    Past Surgical History:  Procedure Laterality Date  . TONSILLECTOMY  1962    OB History    No data available       Home Medications    Prior to Admission  medications   Medication Sig Start Date End Date Taking? Authorizing Provider  albuterol (PROVENTIL HFA;VENTOLIN HFA) 108 (90 Base) MCG/ACT inhaler Inhale 2 puffs into the lungs every 6 (six) hours as needed for wheezing or shortness of breath. 11/27/15  Yes Leandrew Koyanagi, MD  aspirin 81 MG tablet Take 81 mg by mouth every morning.    Yes [provider]  azelastine (ASTELIN) 0.1 % nasal spray Place into both nostrils daily as needed. Use in each nostril as directed    Yes [provider]  benzonatate (TESSALON) 100 MG capsule Take by mouth 3 (three) times daily as needed for cough.   Yes [provider]  Calcium Citrate-Vitamin D (CALCIUM + D PO) Take 1 tablet by mouth every morning.    Yes [provider]  FLUoxetine (PROZAC) 20 MG capsule Take 20 mg by mouth every morning.    Yes [provider]  glimepiride (AMARYL) 2 MG tablet Take 2 mg by mouth 2 (two) times daily.    Yes [provider]  hydrochlorothiazide (MICROZIDE) 12.5 MG capsule Take 12.5 mg by mouth every morning.    Yes [provider]  ipratropium-albuterol (DUONEB) 0.5-2.5 (3) MG/3ML SOLN Inhale 3 mLs into the lungs daily as needed (wheezing).  06/25/17  Yes [provider]  lisinopril (PRINIVIL,ZESTRIL) 10 MG tablet Take 10 mg by mouth at bedtime.    Yes [provider]  lovastatin (MEVACOR) 20 MG tablet Take  20 mg by mouth at bedtime.   Yes [provider]  meloxicam (MOBIC) 15 MG tablet Take 15 mg by mouth daily as needed for pain.  07/29/16  Yes [provider]  metFORMIN (GLUCOPHAGE) 1000 MG tablet Take 1,000 mg by mouth 2 (two) times daily with a meal.   Yes [provider]  nitroGLYCERIN (NITROSTAT) 0.4 MG SL tablet Place 1 tablet (0.4 mg total) under the tongue every 5 (five) minutes as needed for chest pain. 08/10/16  Yes Theodis Blaze, MD  Omega-3 Fatty Acids (FISH OIL) 1000 MG CAPS Take 1,000 mg by mouth every  morning.    Yes [provider]  verapamil (VERELAN PM) 240 MG 24 hr capsule Take 240 mg by mouth every morning.    Yes [provider]  acetaminophen (TYLENOL) 325 MG tablet Take 2 tablets (650 mg total) by mouth every 4 (four) hours as needed for headache or mild pain. Patient not taking: Reported on 07/12/2017 08/10/16   Theodis Blaze, MD  amoxicillin (AMOXIL) 500 MG capsule Take 1 capsule (500 mg total) by mouth 2 (two) times daily with a meal. Patient not taking: Reported on 07/05/2017 11/18/16   [name], PA-C  amoxicillin-clavulanate (AUGMENTIN) 500-125 MG tablet Take 1 tablet (500 mg total) by mouth 2 (two) times daily with a meal. Patient not taking: Reported on 07/05/2017 11/18/16   [name], PA-C  HYDROcodone-homatropine [facility]) 5-1.5 MG/5ML syrup Take 5 mLs by mouth at bedtime as needed. Patient not taking: Reported on 07/05/2017 11/18/16   [name], PA-C  predniSONE (DELTASONE) 10 MG tablet Take 4 tablets (40 mg total) by mouth daily. Patient not taking: Reported on 07/12/2017 07/05/17   Fredia Sorrow, MD    Family History Family History  Problem Relation Age of Onset  . Diabetes Mother   . Hypertension Mother   . CAD Mother   . Hypertension Father   . Diabetes Father   . CAD Father   . Diabetes Brother   . Hyperlipidemia Brother   . Hypertension Brother   . CAD Brother   . Diabetes Sister   . Hypertension Sister     Social History Social History  Substance Use Topics  . Smoking status: Never Smoker  . Smokeless tobacco: Never Used  . Alcohol use No     Allergies   Iohexol   Review of Systems Review of Systems  All other systems reviewed and are negative.    Physical Exam Updated Vital Signs BP 123/64   Pulse 65   Temp 97.9 F (36.6 C) (Rectal)   Resp 18   Ht 5\' 10"  (1.778 m)   Wt 108 kg (238 lb)   SpO2 96%   BMI 34.15 kg/m   Physical Exam  Constitutional: She is oriented to person, place, and time. She appears  well-developed and well-nourished. No distress.  HENT:  Head: Normocephalic and atraumatic.  Eyes: Pupils are equal, round, and reactive to light. Conjunctivae and EOM are normal.  Neck: Normal range of motion and phonation normal. Neck supple.  Cardiovascular: Normal rate and regular rhythm.   Pulmonary/Chest: Effort normal and breath sounds normal. No respiratory distress. She has no wheezes. She exhibits no tenderness.  Abdominal: Soft. She exhibits no distension. There is no tenderness. There is no guarding.  Musculoskeletal: Normal range of motion. She exhibits no edema, tenderness or deformity.  Neurological: She is alert and oriented to person, place, and time. She exhibits normal muscle tone.  Skin: Skin  is warm and dry.  Psychiatric: She has a normal mood and affect. Her behavior is normal. Judgment and thought content normal.  Nursing note and vitals reviewed.    ED Treatments / Results  Labs (all labs ordered are listed, but only abnormal results are displayed) Labs Reviewed  BASIC METABOLIC PANEL - Abnormal; Notable for the following:       Result Value   Chloride 99 (*)    Glucose, Bld 214 (*)    Creatinine, Ser 1.08 (*)    GFR calc non Af Amer 53 (*)    All other components within normal limits  CBC - Abnormal; Notable for the following:    WBC 15.6 (*)    RBC 5.15 (*)    All other components within normal limits  URINALYSIS, ROUTINE W REFLEX MICROSCOPIC - Abnormal; Notable for the following:    APPearance HAZY (*)    Protein, ur 100 (*)    Leukocytes, UA LARGE (*)    Bacteria, UA RARE (*)    Squamous Epithelial / LPF 6-30 (*)    Non Squamous Epithelial 0-5 (*)    All other components within normal limits  I-STAT CG4 LACTIC ACID, ED - Abnormal; Notable for the following:    Lactic Acid, Venous 5.54 (*)    All other components within normal limits  I-STAT CG4 LACTIC ACID, ED - Abnormal; Notable for the following:    Lactic Acid, Venous 3.60 (*)    All other  components within normal limits  CULTURE, BLOOD (ROUTINE X 2)  CULTURE, BLOOD (ROUTINE X 2)  URINE CULTURE  I-STAT TROPONIN, ED    EKG  EKG Interpretation  Date/Time:  Thursday July 12 2017 13:25:51 EDT Ventricular Rate:  92 PR Interval:  132 QRS Duration: 130 QT Interval:  382 QTC Calculation: 472 R Axis:   13 Text Interpretation:  Normal sinus rhythm Left bundle branch block Abnormal ECG since last tracing no significant change Confirmed by Daleen Bo [phone]) on 07/12/2017 4:04:55 PM       Radiology Dg Chest 2 View  Result Date: 07/12/2017 CLINICAL DATA:  Chest pain EXAM: CHEST  2 VIEW COMPARISON:  07/05/2017 FINDINGS: There is chronic asymmetric elevation of the left hemidiaphragm. Normal heart size. No pleural effusion or edema. No airspace opacities. IMPRESSION: 1. No acute cardiopulmonary abnormalities. Electronically Signed   By: Kerby Moors M.D.   On: 07/12/2017 14:33    Procedures Procedures (including critical care time)  Medications Ordered in ED Medications  cefTRIAXone (ROCEPHIN) 1 g in dextrose 5 % 50 mL IVPB (not administered)  azithromycin (ZITHROMAX) 500 mg in dextrose 5 % 250 mL IVPB (not administered)  sodium chloride 0.9 % bolus 1,000 mL (0 mLs Intravenous Stopped 07/12/17 1734)    And  sodium chloride 0.9 % bolus 1,000 mL (0 mLs Intravenous Stopped 07/12/17 1734)    And  sodium chloride 0.9 % bolus 1,000 mL (0 mLs Intravenous Stopped 07/12/17 1824)    And  sodium chloride 0.9 % bolus 500 mL (0 mLs Intravenous Stopped 07/12/17 1856)  hydrocortisone sodium succinate (SOLU-CORTEF) 100 MG injection 200 mg (200 mg Intravenous Given 07/12/17 1705)  vancomycin (VANCOCIN) 2,000 mg in sodium chloride 0.9 % 500 mL IVPB (2,000 mg Intravenous New Bag/Given 07/12/17 1727)     Initial Impression / Assessment and Plan / ED Course  I have reviewed the triage vital signs and the nursing notes.  Pertinent labs & imaging results that were available during my care  of the  patient were reviewed by me and considered in my medical decision making (see chart for details).  Clinical Course as of Jul 13 1943  Thu Jul 12, 2017  1700 High Lactic Acid, Venous: (!!) 5.54 [EW]  1700 High WBC: (!) 15.6 [EW]  1700 High Glucose: (!) 214 [EW]  1700 No acute cardiopulmonary disorders DG Chest 2 View [EW]  1700 Persistent chest discomfort, without localizing causes on clinical exam. Marked elevation of lactate, and white blood cell count, without fever, possibly concerning for infection, but more likely not related to infection. Clinical concern for aortic dissection. Patient has IV dye allergy, related to a prior episode of attempt at CT imaging, which was followed by a heavy feeling in her chest with some shortness of breath and ill feeling. This was done remotely. Case was discussed with radiology, Dr. Nyoka Cowden, who initially read her CT scan chest on 07/04/17. He is unable to see an indicator for aortic dissection on this study. Note that he did not have IV contrast given at that time.  [EW]    Clinical Course User Index [EW] Daleen Bo, MD     Patient Vitals for the past 24 hrs:  BP Temp Temp src Pulse Resp SpO2 Height Weight  07/12/17 1830 123/64 - - 65 18 96 % - -  07/12/17 1800 (!) 128/54 - - 65 15 98 % - -  07/12/17 1745 - 97.9 F (36.6 C) Rectal - - - - -  07/12/17 1745 (!) 136/54 - - 71 20 96 % - -  07/12/17 1730 (!) 126/51 - - 69 14 94 % - -  07/12/17 1700 125/61 - - 73 16 93 % - -  07/12/17 1600 109/61 - - 73 18 95 % - -  07/12/17 1500 (!) 122/48 - - 77 17 94 % - -  07/12/17 1332 (!) 123/54 97.7 F (36.5 C) Oral 92 17 95 % 5\' 10"  (1.778 m) 108 kg (238 lb)    7:20 PM Reevaluation with update and discussion. After initial assessment and treatment, an updated evaluation reveals she remains comfortable has no further complaints, findings discussed with the patient and all questions were answered. Albert Devaul L   7:23 PM-Consult complete with  hospitalist. Patient case explained and discussed.  He agrees to admit patient for further evaluation and treatment. Call ended at 59: 34  CRITICAL CARE Performed by: Daleen Bo L Total critical care time: 45 minutes Critical care time was exclusive of separately billable procedures and treating other patients. Critical care was necessary to treat or prevent imminent or life-threatening deterioration. Critical care was time spent personally by me on the following activities: development of treatment plan with patient and/or surrogate as well as nursing, discussions with consultants, evaluation of patient's response to treatment, examination of patient, obtaining history from patient or surrogate, ordering and performing treatments and interventions, ordering and review of laboratory studies, ordering and review of radiographic studies, pulse oximetry and re-evaluation of patient's condition.   Final Clinical Impressions(s) / ED Diagnoses   Final diagnoses:  Nonspecific chest pain  Elevated lactic acid level    Patient with persistent chest pain, for almost 4 weeks.  Previously treated for pneumonia during this time course, with some improvement of symptoms other than the pain.  Pain is moderately severe at 6-8/10.  Patient requires evaluation for thoracic aortic dissection, but has a allergy to IV dye.  Desensitization treatment begun with IV steroids, for planned 13 hour prep prior to CT angiogram of the  chest, with aortic dissection protocol.  Incidental lactate elevation is nonspecific, patient has been covered with empiric antibiotics for unknown source of infection.  Lactate improved following IV fluid treatment. She is hemodynamically stable.  She will require admission, to the hospital, for stabilization, and further evaluation of her chest discomfort.  Nursing Notes Reviewed/ Care Coordinated Applicable Imaging Reviewed Interpretation of Laboratory Data incorporated into ED  treatment  Plan: Admit  New Prescriptions New Prescriptions   No medications on file     Daleen Bo, MD 07/12/17 1945

## 2017-07-12 NOTE — ED Notes (Signed)
ED Provider at bedside. 

## 2017-07-12 NOTE — ED Notes (Signed)
[facility] Hospitalist paged re need to change bed status d/t active cp per 3W nurse.

## 2017-07-12 NOTE — Progress Notes (Signed)
Pharmacy Antibiotic Note  Samantha [name] is a 61 y.o. female admitted on 07/12/2017 with sepsis.  Patient has reported persistent pneumonia unresolved by three different courses of antibiotics. Recent admission on August 9th.  Samantha [name] received one dose of vancomycin and a dose of zosyn while in the ED. Her therapy was then transitioned to standard CAP therapy with azithromycin and ceftriaxone.  Plan: Vancomycin 2000mg  x1 Zosyn 3.375 gm x1 Start azithromycin 500 mg Q24hours Start ceftriaxone 1g Q24hours Follow for LOT, clinical status, and cultures   Height: 5\' 10"  (177.8 cm) Weight: 238 lb (108 kg) IBW/kg (Calculated) : 68.5  Temp (24hrs), Avg:97.8 F (36.6 C), Min:97.7 F (36.5 C), Max:97.9 F (36.6 C)   Recent Labs Lab 07/12/17 1339 07/12/17 1416 07/12/17 1723  WBC 15.6*  --   --   CREATININE 1.08*  --   --   LATICACIDVEN  --  5.54* 3.60*    Estimated Creatinine Clearance: 71 mL/min (A) (by C-G formula based on SCr of 1.08 mg/dL (H)).    Antimicrobials this admission: 8/16 Vancomycin x1 8/16 Zosyn x1 8/16 Ceftriaxone>> 8/16 Azithromycin>>  Microbiology results: 8/16 Urine Culture>> 8/16 Blood Culture>>   Patterson Hammersmith PharmD PGY1 Pharmacy Practice Resident 07/12/2017 7:54 PM Pager: [phone]

## 2017-07-12 NOTE — H&P (Signed)
History and Physical  Patient Name: Samantha [name]     NFA:[id number]    DOB: [birth year]    DOA: 07/12/2017 PCP: Helane Rima, MD  Patient coming from: Home  Chief Complaint: Cough, chest pain, SOB      HPI: Samantha [name] is a 61 y.o. female with a past medical history significant for NIDDM, HTN who presents with 3 weeks cough, chest pain and SOB.  The patient was in her usual state of health until July 21 when she developed new fever, malaise, cough productive of thick sputum, and left-sided chest pain. She was seen in urgent care on July 25, chest x-ray reportedly showed a left lower lobe infiltrate (per UC note in care everywhere), she was prescribed azithromycin and discharged. She took the azithromycin and her fever resolved.    HOWEVER, her cough (now non-productive and without hemoptysis) and chest pain persisted, as did vague malaise, so in the subsequent two weeks, she was seen three more times by her PCP's office, prescribed doxycyline, Levaquin and prednisone x2, all of which she took, all of which did nothing (perhaps the prednisone "opened my lungs up a little").  She was also seen in the ER in that time, had a CT that showed only trace atelectasis in the LLL.  She finished the last Levaquin 2 days ago, and continues to have daily malaise, intermittent sweats, constant, moderate left chest pain, worse with inspiration, not exertional, and cough without sputum or hemopytsis, so she came to the ER.  ED course: -Afebrile, heart rate 92, respirations and pulse ox normal, BP 123/54 -Na 138, K 3.6, Cr 1.08 (baseline 0.9), WBC 15.6K, Hgb 14.7 -Troponin negative -Lactic acid 5.54 -UA with WBC TNTC -CXR clear -ECG showed an old LBBB -Cultures were drawn, she was given empiric vancomycin and Zosyn and 30 cc/kg IV fluids and TRH were asked to evaluate for chset pain     ROS: Review of Systems  Constitutional: Positive for diaphoresis and malaise/fatigue. Negative for  fever.  Respiratory: Positive for cough and shortness of breath. Negative for hemoptysis, sputum production and wheezing.   Cardiovascular: Positive for chest pain. Negative for palpitations, orthopnea, claudication, leg swelling and PND.  Genitourinary: Negative for dysuria, flank pain, frequency, hematuria and urgency.  All other systems reviewed and are negative.         Past Medical History:  Diagnosis Date  . Arthritis   . Dyslipidemia   . Heart murmur 2011  . Left bundle branch block 2011  . Type II or unspecified type diabetes mellitus without mention of complication, not stated as uncontrolled   . Unspecified essential hypertension     Past Surgical History:  Procedure Laterality Date  . TONSILLECTOMY  1962    Social History: The patient walks unassisted.  Nonsmoker.  Allergies  Allergen Reactions  . Iohexol Anaphylaxis     Code: SOB, Desc: respiratory distress-ASM, Onset Date: [id number]     Family history: family history includes CAD in her brother, father, and mother; Diabetes in her brother, father, mother, and sister; Hyperlipidemia in her brother; Hypertension in her brother, father, mother, and sister.  Prior to Admission medications   Medication Sig Start Date End Date Taking? Authorizing Provider  albuterol (PROVENTIL HFA;VENTOLIN HFA) 108 (90 Base) MCG/ACT inhaler Inhale 2 puffs into the lungs every 6 (six) hours as needed for wheezing or shortness of breath. 11/27/15  Yes Leandrew Koyanagi, MD  aspirin 81 MG tablet Take 81 mg by mouth every morning.  Yes [provider]  azelastine (ASTELIN) 0.1 % nasal spray Place into both nostrils daily as needed. Use in each nostril as directed    Yes [provider]  benzonatate (TESSALON) 100 MG capsule Take by mouth 3 (three) times daily as needed for cough.   Yes [provider]  Calcium Citrate-Vitamin D (CALCIUM + D PO) Take 1 tablet by mouth every morning.    Yes [provider]  FLUoxetine (PROZAC) 20 MG capsule Take 20 mg by mouth every morning.    Yes [provider]  glimepiride (AMARYL) 2 MG tablet Take 2 mg by mouth 2 (two) times daily.    Yes [provider]  hydrochlorothiazide (MICROZIDE) 12.5 MG capsule Take 12.5 mg by mouth every morning.    Yes [provider]  ipratropium-albuterol (DUONEB) 0.5-2.5 (3) MG/3ML SOLN Inhale 3 mLs into the lungs daily as needed (wheezing).  06/25/17  Yes [provider]  lisinopril (PRINIVIL,ZESTRIL) 10 MG tablet Take 10 mg by mouth at bedtime.    Yes [provider]  lovastatin (MEVACOR) 20 MG tablet Take 20 mg by mouth at bedtime.   Yes [provider]  meloxicam (MOBIC) 15 MG tablet Take 15 mg by mouth daily as needed for pain.  07/29/16  Yes [provider]  metFORMIN (GLUCOPHAGE) 1000 MG tablet Take 1,000 mg by mouth 2 (two) times daily with a meal.   Yes [provider]  nitroGLYCERIN (NITROSTAT) 0.4 MG SL tablet Place 1 tablet (0.4 mg total) under the tongue every 5 (five) minutes as needed for chest pain. 08/10/16  Yes Theodis Blaze, MD  Omega-3 Fatty Acids (FISH OIL) 1000 MG CAPS Take 1,000 mg by mouth every morning.    Yes [provider]  verapamil (VERELAN PM) 240 MG 24 hr capsule Take 240 mg by mouth every morning.    Yes [provider]       Physical Exam: BP 123/64   Pulse 65   Temp 97.9 F (36.6 C) (Rectal)   Resp 18   Ht _0  (1.778 m)   Wt 108 kg (238 lb)   SpO2 96%   BMI 34.15 kg/m  General appearance: Well-developed, adult female, alert and in no acute distress.   Eyes: Anicteric, conjunctiva pink, lids and lashes normal. PERRL.    ENT: No nasal deformity, discharge, epistaxis.  Hearing normal. OP moist without lesions.   Neck: No neck masses.  Trachea midline.  No thyromegaly/tenderness. Lymph: No cervical or supraclavicular lymphadenopathy. Skin: Warm and dry.  No jaundice.  No suspicious  rashes or lesions. Cardiac: RRR, nl S1-S2, no murmurs appreciated.  Capillary refill is brisk.  JVP normal.  No LE edema.  Radial and DP pulses 2+ and symmetric.   Respiratory: Normal respiratory rate and rhythm.  CTAB without rales or wheezes. Abdomen: Abdomen soft.  No TTP. No ascites, distension, hepatosplenomegaly.   MSK: No deformities or effusions.  No cyanosis or clubbing. No pain on palpation of chest wall. Neuro: Cranial nerves normal.  Sensation intact to light touch. Speech is fluent.  Muscle strength normal.    Psych: Sensorium intact and responding to questions, attention normal.  Behavior appropriate.  Affect normal.  Judgment and insight appear normal.     Labs on Admission:  I have personally reviewed following labs and imaging studies: CBC:  Recent Labs Lab 07/12/17 1339  WBC 15.6*  HGB 14.7  HCT 44.4  MCV 86.2  PLT 322   Basic  Metabolic Panel:  Recent Labs Lab 07/12/17 1339  NA 138  K 3.6  CL 99*  CO2 24  GLUCOSE 214*  BUN 17  CREATININE 1.08*  CALCIUM 9.2   GFR: Estimated Creatinine Clearance: 71 mL/min (A) (by C-G formula based on SCr of 1.08 mg/dL (H)).  Liver Function Tests: No results for input(s): AST, ALT, ALKPHOS, BILITOT, PROT, ALBUMIN in the last 168 hours. No results for input(s): LIPASE, AMYLASE in the last 168 hours. No results for input(s): AMMONIA in the last 168 hours. Coagulation Profile: No results for input(s): INR, PROTIME in the last 168 hours. Cardiac Enzymes: No results for input(s): CKTOTAL, CKMB, CKMBINDEX, TROPONINI in the last 168 hours. BNP (last 3 results) No results for input(s): PROBNP in the last 8760 hours. HbA1C: No results for input(s): HGBA1C in the last 72 hours. CBG: No results for input(s): GLUCAP in the last 168 hours. Lipid Profile: No results for input(s): CHOL, HDL, LDLCALC, TRIG, CHOLHDL, LDLDIRECT in the last 72 hours. Thyroid Function Tests: No results for input(s): TSH, T4TOTAL, FREET4, T3FREE,  THYROIDAB in the last 72 hours. Anemia Panel: No results for input(s): VITAMINB12, FOLATE, FERRITIN, TIBC, IRON, RETICCTPCT in the last 72 hours. Sepsis Labs: Lactate 5.54 Invalid input(s): PROCALCITONIN, LACTICIDVEN No results found for this or any previous visit (from the past 240 hour(s)).       Radiological Exams on Admission: Personally reviewed CXR shows no focal opacity or edema: Dg Chest 2 View  Result Date: 07/12/2017 CLINICAL DATA:  Chest pain EXAM: CHEST  2 VIEW COMPARISON:  07/05/2017 FINDINGS: There is chronic asymmetric elevation of the left hemidiaphragm. Normal heart size. No pleural effusion or edema. No airspace opacities. IMPRESSION: 1. No acute cardiopulmonary abnormalities. Electronically Signed   By: Kerby Moors M.D.   On: 07/12/2017 14:33    EKG: Independently reviewed. Rate 92, LBBB, old.  No changes from previous.  Echocardiogram 2017: Report reviewed EF 55% Grade I DD         Assessment/Plan  1. Cough, chest pain:  Fever and cough and reported +CXR, now s/p three rounds of antibiotics, two rounds prednisone now with normal non-contrasted CT chest but persistent cough, chest pain, SOB.  Differential includes post-infectious cough (likely), PE, pericarditis.  Will rule out PE after 13-hr steroid prep.  Exam benign.    Atypical CP for three weeks and now with normal ECG and troponin, HEART score 2, doubt ACS.  If CTA negative, and CP persists could have stress as outpatient. -Check RVP -Check procalcitonin -Obtain CTA after steroid prep -Obtain echo to rule out WMA or pericarditis   2. Elevated lactic acid:  I have to assume this is metformin related, as she meets SIRS criteria strictly but DOES NOT APPEAR CLINICALLY TO HAVE SEPSIS.  However, there's no AKI, and it is unclear what other illness would precipitate a metformin lactic acidosis. -Trend lactate -Given acidosis, will err on side of empiric abx with CTX, azithro; but low threshold to  stop if procalcitonin low  3. Diabetes:  -Hold metformin -SSI   4. Hypertension:  -Continue aspirin, statin -Continue HCT, lisinopril, verapamil  5. Other medications:  -Continue Mobic PRN, fluoxetine      DVT prophylaxis: Lovenox  Code Status: FULL  Family Communication: None prsent  Disposition Plan: Anticipate empiric antibiotics and procalcintonin.  Steroid prep and CTA.  Echocardiogram and if testing negative, home after. Consults called: None Admission status: OBS At the point of initial evaluation, it is my clinical opinion that  admission for OBSERVATION is reasonable and necessary because the patient's presenting complaints in the context of their chronic conditions represent sufficient risk of deterioration or significant morbidity to constitute reasonable grounds for close observation in the hospital setting, but that the patient may be medically stable for discharge from the hospital within 24 to 48 hours.    Medical decision making: Patient seen at 7:35 PM on 07/12/2017.  The patient was discussed with Dr. Eulis Foster.  What exists of the patient's chart was reviewed in depth and summarized above.  Clinical condition: stable, no hypotension, no respiratory distress, mentation stable and symptoms stable over last 2 weeks.        Edwin Dada [facility] Pager [phone]

## 2017-07-12 NOTE — Progress Notes (Signed)
Pharmacy Antibiotic Note  Samantha [name] is a 61 y.o. female admitted on 07/12/2017 with sepsis.  Pharmacy has been consulted for vancomycin and zosyn dosing. Patient has reported persistent pneumonia unresolved by three different courses of antibiotics. Recent admission on August 9th.  WBC's 15.6, afebrile, Scr 1.08; LA trending down (3.6) Normalized CrCl ~61  Plan: Vancomycin '2000mg'$  x1 Loading dose Vancomycin 750 mg Q12 Maintenance dose Zosyn 3.375gm Q8h  Height: '5\' 10"'$  (177.8 cm) Weight: 238 lb (108 kg) IBW/kg (Calculated) : 68.5  Temp (24hrs), Avg:97.7 F (36.5 C), Min:97.7 F (36.5 C), Max:97.7 F (36.5 C)   Recent Labs Lab 07/12/17 1339 07/12/17 1416 07/12/17 1723  WBC 15.6*  --   --   CREATININE 1.08*  --   --   LATICACIDVEN  --  5.54* 3.60*    Estimated Creatinine Clearance: 71 mL/min (A) (by C-G formula based on SCr of 1.08 mg/dL (H)).    Allergies  Allergen Reactions  . Iohexol Anaphylaxis     Code: SOB, Desc: respiratory distress-ASM, Onset Date: [id number]     Antimicrobials this admission: 8/16 Vancomycin>> 8/16 Zosyn>>  Microbiology results: 8/16 Urine Culture>> 8/16 Blood Culture>>   Patterson Hammersmith PharmD PGY1 Pharmacy Practice Resident 07/12/2017 6:20 PM Pager: [phone]

## 2017-07-13 ENCOUNTER — Observation Stay (HOSPITAL_BASED_OUTPATIENT_CLINIC_OR_DEPARTMENT_OTHER): Payer: Self-pay

## 2017-07-13 ENCOUNTER — Observation Stay (HOSPITAL_COMMUNITY): Payer: Self-pay

## 2017-07-13 DIAGNOSIS — J189 Pneumonia, unspecified organism: Secondary | ICD-10-CM | POA: Diagnosis present

## 2017-07-13 DIAGNOSIS — R079 Chest pain, unspecified: Secondary | ICD-10-CM

## 2017-07-13 DIAGNOSIS — I10 Essential (primary) hypertension: Secondary | ICD-10-CM

## 2017-07-13 LAB — RESPIRATORY PANEL BY PCR

## 2017-07-13 LAB — BASIC METABOLIC PANEL
Anion gap: 9 (ref 5–15)
BUN: 14 mg/dL (ref 6–20)
CO2: 23 mmol/L (ref 22–32)
Calcium: 7.7 mg/dL — ABNORMAL LOW (ref 8.9–10.3)
Chloride: 104 mmol/L (ref 101–111)
Creatinine, Ser: 0.83 mg/dL (ref 0.44–1.00)
GFR calc Af Amer: >60 mL/min (ref >60)
GFR calc non Af Amer: >60 mL/min (ref >60)
Glucose, Bld: 248 mg/dL — ABNORMAL HIGH (ref 65–99)
Potassium: 4.1 mmol/L (ref 3.5–5.1)
Sodium: 136 mmol/L (ref 135–145)

## 2017-07-13 LAB — CBC
HCT: 37.5 % (ref 36.0–46.0)
Hemoglobin: 12.4 g/dL (ref 12.0–15.0)
MCH: 28.3 pg (ref 26.0–34.0)
MCHC: 33.1 g/dL (ref 30.0–36.0)
MCV: 85.6 fL (ref 78.0–100.0)
Platelets: 234 10*3/uL (ref 150–400)
RBC: 4.38 MIL/uL (ref 3.87–5.11)
RDW: 14.4 % (ref 11.5–15.5)
WBC: 12.9 10*3/uL — ABNORMAL HIGH (ref 4.0–10.5)

## 2017-07-13 LAB — GLUCOSE, CAPILLARY
Glucose-Capillary: 241 mg/dL — ABNORMAL HIGH (ref 65–99)
Glucose-Capillary: 267 mg/dL — ABNORMAL HIGH (ref 65–99)
Glucose-Capillary: 328 mg/dL — ABNORMAL HIGH (ref 65–99)
Glucose-Capillary: 270 mg/dL — ABNORMAL HIGH (ref 65–99)
Glucose-Capillary: 285 mg/dL — ABNORMAL HIGH (ref 65–99)

## 2017-07-13 LAB — PROCALCITONIN: Procalcitonin: <0.1 ng/mL

## 2017-07-13 LAB — LACTIC ACID, PLASMA
Lactic Acid, Venous: 2.3 mmol/L (ref 0.5–1.9)
Lactic Acid, Venous: 2.6 mmol/L (ref 0.5–1.9)

## 2017-07-13 LAB — TROPONIN I: Troponin I: <0.03 ng/mL (ref <0.03)

## 2017-07-13 LAB — ECHOCARDIOGRAM COMPLETE
Height: 70 in
Weight: 3924.8 oz

## 2017-07-13 LAB — URINE CULTURE: Culture: NO GROWTH

## 2017-07-13 LAB — HIV ANTIBODY (ROUTINE TESTING W REFLEX): HIV Screen 4th Generation wRfx: NONREACTIVE

## 2017-07-13 MED ORDER — ENOXAPARIN SODIUM 60 MG/0.6ML ~~LOC~~ SOLN: 50.0000 mg | SUBCUTANEOUS | Status: DC

## 2017-07-13 MED ORDER — ENOXAPARIN SODIUM 60 MG/0.6ML ~~LOC~~ SOLN
55.0000 mg | SUBCUTANEOUS | Status: DC
  Administered 2017-07-14 – 2017-07-16 (×3): 55 mg via SUBCUTANEOUS
  Filled 2017-07-13 (×3): qty 0.6

## 2017-07-13 MED ORDER — DIPHENHYDRAMINE HCL 25 MG PO CAPS
50.0000 mg | ORAL_CAPSULE | Freq: Once | ORAL | Status: AC
  Administered 2017-07-13: 50 mg via ORAL
  Filled 2017-07-13: qty 2

## 2017-07-13 MED ORDER — IOPAMIDOL (ISOVUE-370) INJECTION 76%
INTRAVENOUS | Status: AC
  Filled 2017-07-13: qty 100

## 2017-07-13 NOTE — Progress Notes (Signed)
Ct called and stated they noticed pt's neck was red prior to ct. Pt denied any pain, itching or sob. No rash noted just a red neck. Pt stated " I did not even realize my neck was red until someone pointed it out". Pt proceeded with CT. Pt's neck still a little red and denies any itching or SOB. MD on call made aware. No new orders received. Will cont to monitor pt.

## 2017-07-13 NOTE — Progress Notes (Signed)
Inpatient Diabetes Program Recommendations  AACE/ADA: New Consensus Statement on Inpatient Glycemic Control (2015)  Target Ranges:  Prepandial:   less than 140 mg/dL      Peak postprandial:   less than 180 mg/dL (1-2 hours)      Critically ill patients:  140 - 180 mg/dL   Review of Glycemic Control Results for Samantha [name], Samantha [name] (MRN [id number]) as of 07/13/2017 10:58  Ref. Range 07/12/2017 22:57 07/13/2017 07:37  Glucose-Capillary Latest Ref Range: 65 - 99 mg/dL 274 (H) 241 (H)   Diabetes history: DM2 Outpatient Diabetes medications: Amaryl 2 mg qd + Metformin 1 gm bid Current orders for Inpatient glycemic control: Novolog correction 0-9 units tid + 0-5 units hs  Inpatient Diabetes Program Recommendations:  A1c from [facility] 06/11/17 was 8.3. While oral meds on hold, Please consider Lantus basal 22 units daily ( 0.2 units/kg x 111.3 kg = 22 units) Will follow.  Thank you, Nani Gasser. Jamileth Putzier, RN, MSN, CDE  Diabetes Coordinator Inpatient Glycemic Control Team Team Pager [phone] (8am-5pm) 07/13/2017 11:10 AM

## 2017-07-13 NOTE — Progress Notes (Signed)
PROGRESS NOTE    Samantha [name]  JQZ:[id number] DOB: October 07, 1956 DOA: 07/12/2017 PCP: Helane Rima, MD  Outpatient Specialists:     Brief Narrative:  9 ? htn Body mass index is 35.2 kg/m.  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  Admitted 8.16 23 weeks malaise cough sputum-7/25 had CXR=LLL infiltrate On admit HR 92, wbc 15, Lactic acid 5.5 Started zosyn/Vanc and IVF Troponin neg--echo ordered    Assessment & Plan:   Principal Problem:   Chest pain Active Problems:   Diabetes ([place])   Essential hypertension   Chr bronchitis/failed OP PNa Rx ? Viral illness Pulmonary nodules on CT  Patient underwent CTA negative for pulmonary embolism  Continue to treat broad spectrum antibiotics ceftriaxone/azithromycin  Get procalcitonin/lactic acid to guide therapy  Repeat CBC plus differential a.m.  If no improvement consider pulmonology input  Can continue DuoNeb for now  Needs outpatient low-dose CT scan chest in 6 months per guidelines  Bipolar  Continue Prozac 20 p.m.  Diabetes mellitus type 2 + nephropathy  PTCA admission meds Amaryl 2 mg twice a day  Sugars probably elevated in the 300s secondary to retreatment with steroids  Continue sliding scale insulin for now expect her sugars to trend back down to the 200s  BMI 35  Needs outpatient bariatric counseling  Hyperlipidemia  HTN  Continue lisinopril 10, HCTZ 12.5, verapamil 240  Blood pressure moderately controlled  Lovenox Inpatient Discharge when better and transitioned to oral antibiotics obviously within 48 hours given failed outpatient community-acquired pneumonia Discussed with family   Consultants:   None  Procedures:   CT chest 8/17  Antimicrobials:   As above    Subjective: Pleasant oriented no distress Eating and  drinking Feels a little better Still slightly winded overall but no sputum no chills  Objective: Vitals:   07/12/17 2130 07/12/17 2250 07/13/17 0526 07/13/17 0823  BP: 129/60 (!) 125/52 (!) 136/52 136/64  Pulse: 72 66 63   Resp: 20 20  17   Temp:  98.3 F (36.8 C) 98.2 F (36.8 C)   TempSrc:  Oral Oral   SpO2: 93% 95% 96%   Weight:   111.3 kg (245 lb 4.8 oz)   Height:        Intake/Output Summary (Last 24 hours) at 07/13/17 0925 Last data filed at 07/13/17 0300  Gross per 24 hour  Intake          5128.33 ml  Output                0 ml  Net          5128.33 ml   Filed Weights   07/12/17 1332 07/13/17 0526  Weight: 108 kg (238 lb) 111.3 kg (245 lb 4.8 oz)    Examination:  EOMI NCAT Mallampati 2 No JVD No rales Some rhonchi S1-S2 no murmur rub or gallop, regular rate rhythm Abdomen soft no rebound No lower extremity edema Cranial nerves intact Neurologically intact   Data Reviewed: I have personally reviewed following labs and imaging studies  CBC:  Recent Labs Lab 07/12/17 1339 07/13/17 0240  WBC 15.6* 12.9*  HGB 14.7 12.4  HCT 44.4 37.5  MCV 86.2 85.6  PLT 322 622   Basic Metabolic Panel:  Recent Labs Lab 07/12/17 1339 07/13/17 0240  NA 138 136  K 3.6 4.1  CL 99* 104  CO2 24 23  GLUCOSE 214* 248*  BUN 17 14  CREATININE 1.08* 0.83  CALCIUM 9.2 7.7*   GFR: Estimated Creatinine Clearance: 93.8 mL/min (by C-G formula based on SCr of 0.83 mg/dL). Liver Function Tests: No results for input(s): AST, ALT, ALKPHOS, BILITOT, PROT, ALBUMIN in the last 168 hours. No results for input(s): LIPASE, AMYLASE in the last 168 hours. No results for input(s): AMMONIA in the last 168 hours. Coagulation Profile: No results for input(s): INR, PROTIME in the last 168 hours. Cardiac Enzymes:  Recent Labs Lab 07/13/17 0240  TROPONINI <0.03   BNP (last 3 results) No results for input(s): PROBNP in the last 8760 hours. HbA1C: No results for input(s):  HGBA1C in the last 72 hours. CBG:  Recent Labs Lab 07/12/17 2257 07/13/17 0737  GLUCAP 274* 241*   Lipid Profile: No results for input(s): CHOL, HDL, LDLCALC, TRIG, CHOLHDL, LDLDIRECT in the last 72 hours. Thyroid Function Tests: No results for input(s): TSH, T4TOTAL, FREET4, T3FREE, THYROIDAB in the last 72 hours. Anemia Panel: No results for input(s): VITAMINB12, FOLATE, FERRITIN, TIBC, IRON, RETICCTPCT in the last 72 hours. Urine analysis:    Component Value Date/Time   COLORURINE YELLOW 07/12/2017 1722   APPEARANCEUR HAZY (A) 07/12/2017 1722   LABSPEC 1.027 07/12/2017 1722   PHURINE 5.0 07/12/2017 1722   GLUCOSEU NEGATIVE 07/12/2017 1722   HGBUR NEGATIVE 07/12/2017 1722   BILIRUBINUR NEGATIVE 07/12/2017 1722   KETONESUR NEGATIVE 07/12/2017 1722   PROTEINUR 100 (A) 07/12/2017 1722   UROBILINOGEN 0.2 08/24/2009 0903   NITRITE NEGATIVE 07/12/2017 1722   LEUKOCYTESUR LARGE (A) 07/12/2017 1722   Sepsis Labs: @LABRCNTIP (procalcitonin:4,lacticidven:4)  ) Recent Results (from the past 240 hour(s))  Respiratory Panel by PCR     Status: None   Collection Time: 07/12/17 11:47 PM  Result Value Ref Range Status   Adenovirus NOT DETECTED NOT DETECTED Final   Coronavirus 229E NOT DETECTED NOT DETECTED Final   Coronavirus HKU1 NOT DETECTED NOT DETECTED Final   Coronavirus NL63 NOT DETECTED NOT DETECTED Final   Coronavirus OC43 NOT DETECTED NOT DETECTED Final   Metapneumovirus NOT DETECTED NOT DETECTED Final   Rhinovirus / Enterovirus NOT DETECTED NOT DETECTED Final   Influenza A NOT DETECTED NOT DETECTED Final   Influenza A H1 NOT DETECTED NOT DETECTED Final   Influenza A H1 2009 NOT DETECTED NOT DETECTED Final   Influenza A H3 NOT DETECTED NOT DETECTED Final   Influenza B NOT DETECTED NOT DETECTED Final   Parainfluenza Virus 1 NOT DETECTED NOT DETECTED Final   Parainfluenza Virus 2 NOT DETECTED NOT DETECTED Final   Parainfluenza Virus 3 NOT DETECTED NOT DETECTED Final    Parainfluenza Virus 4 NOT DETECTED NOT DETECTED Final   Respiratory Syncytial Virus NOT DETECTED NOT DETECTED Final   Bordetella pertussis NOT DETECTED NOT DETECTED Final   Chlamydophila pneumoniae NOT DETECTED NOT DETECTED Final   Mycoplasma pneumoniae NOT DETECTED NOT DETECTED Final         Radiology Studies: Dg Chest 2 View  Result Date: 07/12/2017 CLINICAL DATA:  Chest pain EXAM: CHEST  2 VIEW COMPARISON:  07/05/2017 FINDINGS: There is chronic asymmetric elevation of the left hemidiaphragm. Normal heart size. No pleural effusion or edema. No airspace opacities. IMPRESSION: 1. No acute cardiopulmonary abnormalities. Electronically Signed   By: Kerby Moors M.D.   On: 07/12/2017 14:33        Scheduled Meds: . aspirin EC  81 mg Oral Daily  .  diphenhydrAMINE  50 mg Oral Once  . enoxaparin (LOVENOX) injection  40 mg Subcutaneous Q24H  . FLUoxetine  20 mg Oral Daily  . hydrochlorothiazide  12.5 mg Oral Daily  . insulin aspart  0-5 Units Subcutaneous QHS  . insulin aspart  0-9 Units Subcutaneous TID WC  . lisinopril  10 mg Oral QHS  . pravastatin  10 mg Oral q1800  . predniSONE  50 mg Oral Q6H  . verapamil  240 mg Oral Daily   Continuous Infusions: . sodium chloride 100 mL/hr at 07/13/17 0834  . azithromycin    . cefTRIAXone (ROCEPHIN)  IV Stopped (07/13/17 0213)     LOS: 0 days    Time spent: [place], MD [facility]   If 7PM-7AM, please contact night-coverage [email or web address] Password [email or web address] 07/13/2017, 9:25 AM

## 2017-07-13 NOTE — Progress Notes (Signed)
  Echocardiogram 2D Echocardiogram has been performed.  Jennette Dubin 07/13/2017, 10:35 AM

## 2017-07-14 LAB — BASIC METABOLIC PANEL
Anion gap: 7 (ref 5–15)
BUN: 10 mg/dL (ref 6–20)
CO2: 28 mmol/L (ref 22–32)
Calcium: 8 mg/dL — ABNORMAL LOW (ref 8.9–10.3)
Chloride: 104 mmol/L (ref 101–111)
Creatinine, Ser: 0.83 mg/dL (ref 0.44–1.00)
GFR calc Af Amer: >60 mL/min (ref >60)
GFR calc non Af Amer: >60 mL/min (ref >60)
Glucose, Bld: 225 mg/dL — ABNORMAL HIGH (ref 65–99)
Potassium: 3.7 mmol/L (ref 3.5–5.1)
Sodium: 139 mmol/L (ref 135–145)

## 2017-07-14 LAB — GLUCOSE, CAPILLARY
Glucose-Capillary: 174 mg/dL — ABNORMAL HIGH (ref 65–99)
Glucose-Capillary: 185 mg/dL — ABNORMAL HIGH (ref 65–99)
Glucose-Capillary: 190 mg/dL — ABNORMAL HIGH (ref 65–99)
Glucose-Capillary: 182 mg/dL — ABNORMAL HIGH (ref 65–99)

## 2017-07-14 LAB — CBC
HCT: 34.8 % — ABNORMAL LOW (ref 36.0–46.0)
Hemoglobin: 11.2 g/dL — ABNORMAL LOW (ref 12.0–15.0)
MCH: 27.9 pg (ref 26.0–34.0)
MCHC: 32.2 g/dL (ref 30.0–36.0)
MCV: 86.6 fL (ref 78.0–100.0)
Platelets: 229 10*3/uL (ref 150–400)
RBC: 4.02 MIL/uL (ref 3.87–5.11)
RDW: 14.7 % (ref 11.5–15.5)
WBC: 14.3 10*3/uL — ABNORMAL HIGH (ref 4.0–10.5)

## 2017-07-14 LAB — PROCALCITONIN: Procalcitonin: <0.1 ng/mL

## 2017-07-14 NOTE — Progress Notes (Addendum)
PROGRESS NOTE    Samantha [name]  ZHY:[id number] DOB: 01/31/1956 DOA: 07/12/2017 PCP: Helane Rima, MD  Outpatient Specialists:     Brief Narrative:   71 ? htn Body mass index is 35.2 kg/m.  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  Admitted 8.16 23 weeks malaise cough sputum-7/25 had CXR=LLL infiltrate On admit HR 92, wbc 15, Lactic acid 5.5 Started zosyn/Vanc and IVF Troponin neg--echo ordered    Assessment & Plan:   Principal Problem:   Chest pain Active Problems:   Diabetes (HCC)   Essential hypertension   PNA (pneumonia)   Chr bronchitis/failed OP PNa Rx ? Viral illness Patient used to work in a factory with a lot of dust-? Occupational pneumoconiosis as underlying risk factor? Pulmonary nodules on CT  Patient underwent CTA negative for pulmonary embolism  Continue to treat broad spectrum antibiotics ceftriaxone/azithromycin and  As PCT <0.10, narrow to orals soon  WBC ~14  Can continue DuoNeb for now  Needs outpatient low-dose CT scan chest in 6 months per guidelines  If no improvement in a.m. probably will need pulmonary input otherwise can transition to by mouth antibiotics and consider discharge soon  PVCs  Had 9 beats of nonsustained VT?  Get magnesium in a.m.-monitor  Bipolar  Continue Prozac 20 p.m.  Diabetes mellitus type 2 + nephropathy  PTCA admission meds Amaryl 2 mg twice a day  Sugars probably elevated in the 300s secondary to retreatment with steroids  Continue sliding scale insulin --sugars are now in the 1 to 200s and are better  BMI 35  Needs outpatient bariatric counseling  Hyperlipidemia  HTN Diastolic heart failure, EF 55-60%  Continue lisinopril 10, HCTZ 12.5, verapamil 240  Blood pressure moderately controlled  She might have some element of acute  diastolic heart failure I will start a low-dose of Lasix 20 and see how her respiratory symptoms do  incidental Adrenal nodule  Needs OP characterization once stable   Lovenox Inpatient Discharge when better in probably 24-48 hours Discussed with family, nephew on phone 8/18   Consultants:   None  Procedures:   CT chest 8/17  Antimicrobials:   As above    Subjective:  Feels better and looks better No fever no chills No nausea no vomiting Eating some Seems a little worried about cause of pneumonia and expresses some concern about whether this will recur  Objective: Vitals:   07/13/17 2320 07/14/17 0834 07/14/17 1401 07/14/17 1455  BP: (!) 130/58 (!) 132/59 (!) 132/51   Pulse: 65  71   Resp: 17 18 18    Temp: (!) 97.5 F (36.4 C)  98.1 F (36.7 C)   TempSrc: Oral  Oral   SpO2: 96%   96%  Weight:      Height:        Intake/Output Summary (Last 24 hours) at 07/14/17 1504 Last data filed at 07/14/17 1200  Gross per 24 hour  Intake             2320 ml  Output                0 ml  Net             2320 ml   Filed Weights   07/12/17 1332 07/13/17 0526  Weight: 108 kg (238 lb) 111.3 kg (  245 lb 4.8 oz)    Examination:  Obese pleasant oriented No JVD No chills No records Mild wheeze bilaterally no rales no rhonchi S1-S2 no murmur rub or gallop No lower extremity edema Abdomen soft no rebound or guarding Neurologically intact without any focal deficit Euthymic   Data Reviewed: I have personally reviewed following labs and imaging studies  CBC:  Recent Labs Lab 07/12/17 1339 07/13/17 0240 07/14/17 0538  WBC 15.6* 12.9* 14.3*  HGB 14.7 12.4 11.2*  HCT 44.4 37.5 34.8*  MCV 86.2 85.6 86.6  PLT 322 234 732   Basic Metabolic Panel:  Recent Labs Lab 07/12/17 1339 07/13/17 0240 07/14/17 0538  NA 138 136 139  K 3.6 4.1 3.7  CL 99* 104 104  CO2 24 23 28   GLUCOSE 214* 248* 225*  BUN 17 14 10   CREATININE 1.08* 0.83 0.83  CALCIUM 9.2 7.7*  8.0*   GFR: Estimated Creatinine Clearance: 93.8 mL/min (by C-G formula based on SCr of 0.83 mg/dL). Liver Function Tests: No results for input(s): AST, ALT, ALKPHOS, BILITOT, PROT, ALBUMIN in the last 168 hours. No results for input(s): LIPASE, AMYLASE in the last 168 hours. No results for input(s): AMMONIA in the last 168 hours. Coagulation Profile: No results for input(s): INR, PROTIME in the last 168 hours. Cardiac Enzymes:  Recent Labs Lab 07/13/17 0240  TROPONINI <0.03   BNP (last 3 results) No results for input(s): PROBNP in the last 8760 hours. HbA1C: No results for input(s): HGBA1C in the last 72 hours. CBG:  Recent Labs Lab 07/13/17 1501 07/13/17 1626 07/13/17 2210 07/14/17 0759 07/14/17 1116  GLUCAP 328* 270* 285* 174* 185*   Lipid Profile: No results for input(s): CHOL, HDL, LDLCALC, TRIG, CHOLHDL, LDLDIRECT in the last 72 hours. Thyroid Function Tests: No results for input(s): TSH, T4TOTAL, FREET4, T3FREE, THYROIDAB in the last 72 hours. Anemia Panel: No results for input(s): VITAMINB12, FOLATE, FERRITIN, TIBC, IRON, RETICCTPCT in the last 72 hours. Urine analysis:    Component Value Date/Time   COLORURINE YELLOW 07/12/2017 1722   APPEARANCEUR HAZY (A) 07/12/2017 1722   LABSPEC 1.027 07/12/2017 1722   PHURINE 5.0 07/12/2017 1722   GLUCOSEU NEGATIVE 07/12/2017 1722   HGBUR NEGATIVE 07/12/2017 1722   BILIRUBINUR NEGATIVE 07/12/2017 1722   KETONESUR NEGATIVE 07/12/2017 1722   PROTEINUR 100 (A) 07/12/2017 1722   UROBILINOGEN 0.2 08/24/2009 0903   NITRITE NEGATIVE 07/12/2017 1722   LEUKOCYTESUR LARGE (A) 07/12/2017 1722       Radiology Studies: Ct Angio Chest Pe W Or Wo Contrast  Result Date: 07/13/2017 CLINICAL DATA:  Shortness of breath and chest pain EXAM: CT ANGIOGRAPHY CHEST WITH CONTRAST TECHNIQUE: Multidetector CT imaging of the chest was performed using the standard protocol during bolus administration of intravenous contrast. Multiplanar  CT image reconstructions and MIPs were obtained to evaluate the vascular anatomy. CONTRAST:  100 mL Isovue 370 nonionic COMPARISON:  Chest CT July 04, 2017; chest radiograph July 12, 2017 FINDINGS: Cardiovascular: There is no demonstrable pulmonary embolus. There is no appreciable thoracic aortic aneurysm or dissection. Visualized great vessels appear unremarkable. There is atherosclerotic calcification in the aortic arch region. There are mild foci of coronary artery calcification. Pericardium does appreciably thickened. Mediastinum/Nodes: No thyroid lesions are evident. There are occasional subcentimeter lymph nodes. There is no adenopathy in the thoracic region by size criteria. No esophageal lesions are appreciable. Lungs/Pleura: There is elevation of the left hemidiaphragm with atelectatic change in the left base. A 4 mm nodular opacity in the posterior  segment right lower lobe is seen on axial slice 72 series 7, better seen on recent CT. Differences in appearance in this area likely due to mild motion in the lung bases on the current examination. No new parenchymal nodular lesion evident. No consolidation or edema evident. No appreciable pleural effusion or pleural thickening. Upper Abdomen: There is incomplete visualization of an apparent adenoma arising from the left adrenal measuring 9 x 8 mm. Visualized upper abdominal structures otherwise appear unremarkable. Musculoskeletal: There is mild degenerative change in the thoracic spine. There are no blastic or lytic bone lesions. Review of the MIP images confirms the above findings. IMPRESSION: 1.  No demonstrable pulmonary embolus. 2. Foci of atherosclerotic calcification in the aorta. No aneurysm or dissection in the aorta. Mild coronary artery calcification noted. 3. Stable mild elevation of the left hemidiaphragm with left base atelectasis. Stable 4 mm nodular opacity posterior right base, better seen on recent prior study due to mild motion at this  time. No follow-up needed if patient is low-risk. Non-contrast chest CT can be considered in 12 months if patient is high-risk. This recommendation follows the consensus statement: Guidelines for Management of Incidental Pulmonary Nodules Detected on CT Images: From the Fleischner Society 2017; Radiology 2017; 284:228-243. 4.  No evident adenopathy. 5. Incomplete visualization of apparent 9 x 8 mm left adrenal adenoma. Aortic Atherosclerosis (ICD10-I70.0). Electronically Signed   By: Lowella Grip III M.D.   On: 07/13/2017 14:07    Scheduled Meds: . aspirin EC  81 mg Oral Daily  . enoxaparin (LOVENOX) injection  55 mg Subcutaneous Q24H  . FLUoxetine  20 mg Oral Daily  . hydrochlorothiazide  12.5 mg Oral Daily  . insulin aspart  0-5 Units Subcutaneous QHS  . insulin aspart  0-9 Units Subcutaneous TID WC  . lisinopril  10 mg Oral QHS  . pravastatin  10 mg Oral q1800  . verapamil  240 mg Oral Daily   Continuous Infusions: . sodium chloride 100 mL/hr at 07/14/17 0602  . azithromycin Stopped (07/13/17 2242)  . cefTRIAXone (ROCEPHIN)  IV Stopped (07/14/17 0329)    LOS: 1 day   Time spent: [place], MD [facility]   If 7PM-7AM, please contact night-coverage [email or web address] Password [facility] 07/14/2017, 3:04 PM

## 2017-07-15 LAB — CBC WITH DIFFERENTIAL/PLATELET
Basophils Absolute: 0 10*3/uL (ref 0.0–0.1)
Basophils Relative: 0 %
Eosinophils Absolute: 0.4 10*3/uL (ref 0.0–0.7)
Eosinophils Relative: 4 %
HCT: 36.4 % (ref 36.0–46.0)
Hemoglobin: 11.8 g/dL — ABNORMAL LOW (ref 12.0–15.0)
Lymphocytes Relative: 43 %
Lymphs Abs: 5 10*3/uL — ABNORMAL HIGH (ref 0.7–4.0)
MCH: 28.2 pg (ref 26.0–34.0)
MCHC: 32.4 g/dL (ref 30.0–36.0)
MCV: 86.9 fL (ref 78.0–100.0)
Monocytes Absolute: 0.8 10*3/uL (ref 0.1–1.0)
Monocytes Relative: 7 %
Neutro Abs: 5.4 10*3/uL (ref 1.7–7.7)
Neutrophils Relative %: 46 %
Platelets: 230 10*3/uL (ref 150–400)
RBC: 4.19 MIL/uL (ref 3.87–5.11)
RDW: 14.7 % (ref 11.5–15.5)
WBC: 11.8 10*3/uL — ABNORMAL HIGH (ref 4.0–10.5)

## 2017-07-15 LAB — COMPREHENSIVE METABOLIC PANEL
ALT: 45 U/L (ref 14–54)
AST: 28 U/L (ref 15–41)
Albumin: 2.9 g/dL — ABNORMAL LOW (ref 3.5–5.0)
Alkaline Phosphatase: 37 U/L — ABNORMAL LOW (ref 38–126)
Anion gap: 7 (ref 5–15)
BUN: 14 mg/dL (ref 6–20)
CO2: 31 mmol/L (ref 22–32)
Calcium: 8.5 mg/dL — ABNORMAL LOW (ref 8.9–10.3)
Chloride: 100 mmol/L — ABNORMAL LOW (ref 101–111)
Creatinine, Ser: 0.93 mg/dL (ref 0.44–1.00)
GFR calc Af Amer: >60 mL/min (ref >60)
GFR calc non Af Amer: >60 mL/min (ref >60)
Glucose, Bld: 188 mg/dL — ABNORMAL HIGH (ref 65–99)
Potassium: 3.6 mmol/L (ref 3.5–5.1)
Sodium: 138 mmol/L (ref 135–145)
Total Bilirubin: 0.5 mg/dL (ref 0.3–1.2)
Total Protein: 5.6 g/dL — ABNORMAL LOW (ref 6.5–8.1)

## 2017-07-15 LAB — GLUCOSE, CAPILLARY
Glucose-Capillary: 151 mg/dL — ABNORMAL HIGH (ref 65–99)
Glucose-Capillary: 145 mg/dL — ABNORMAL HIGH (ref 65–99)
Glucose-Capillary: 161 mg/dL — ABNORMAL HIGH (ref 65–99)
Glucose-Capillary: 174 mg/dL — ABNORMAL HIGH (ref 65–99)

## 2017-07-15 LAB — BRAIN NATRIURETIC PEPTIDE: B Natriuretic Peptide: 139 pg/mL — ABNORMAL HIGH (ref 0.0–100.0)

## 2017-07-15 MED ORDER — LEVOFLOXACIN 500 MG PO TABS
500.0000 mg | ORAL_TABLET | ORAL | Status: DC
  Administered 2017-07-15 – 2017-07-16 (×2): 500 mg via ORAL
  Filled 2017-07-15 (×2): qty 1

## 2017-07-15 NOTE — Progress Notes (Signed)
PROGRESS NOTE    Samantha [name]  JXB:[id number] DOB: 12/30/1955 DOA: 07/12/2017 PCP: Helane Rima, MD  Outpatient Specialists:     Brief Narrative:   39 ? htn Body mass index is 35.86 kg/m.  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  Admitted 8.16 23 weeks malaise cough sputum-7/25 had CXR=LLL infiltrate On admit HR 92, wbc 15, Lactic acid 5.5 Started zosyn/Vanc and IVF Troponin neg--echo ordered    Assessment & Plan:   Principal Problem:   Chest pain Active Problems:   Diabetes (HCC)   Essential hypertension   PNA (pneumonia)   Chr bronchitis/failed OP PNa Rx ? Viral illness Patient used to work in a factory with a lot of dust-? Occupational pneumoconiosis as underlying risk factor? Pulmonary nodules on CT  Patient underwent CTA negative for pulmonary embolism  Continue to treat broad spectrum antibiotics ceftriaxone/azithromycin and  As PCT <0.10, narrow to po levaquin 8.19  WBC ~14-->12  Can continue DuoNeb for now  Needs outpatient low-dose CT scan chest in 6 months per guidelines  Possible d/c 24-48 if better  PVCs  No recurrences  Bipolar  Continue Prozac 20 p.m.  Diabetes mellitus type 2 + nephropathy  PTCA admission meds Amaryl 2 mg twice a day  Sugars probably elevated in the 300s secondary to retreatment with steroids  Continue sliding scale insulin --sugars 140-180  BMI 35  Needs outpatient bariatric counseling  Hyperlipidemia  HTN Diastolic heart failure, EF 55-60%  Continue lisinopril 10, HCTZ 12.5, verapamil 240  Blood pressure moderately controlled  Hold further escalation diuresis-improving  incidental Adrenal nodule  Needs OP characterization once stable   Lovenox Inpatient Discharge when better in probably 24-48 hours    Consultants:    None  Procedures:   CT chest 8/17  Antimicrobials:   As above    Subjective:  Feels better and looks better wanting to walk around No cp no n/v   Objective: Vitals:   07/14/17 1401 07/14/17 1455 07/14/17 2145 07/15/17 0550  BP: (!) 132/51  (!) 131/53 (!) 158/76  Pulse: 71   94  Resp: 18  (!) 22 20  Temp: 98.1 F (36.7 C)  98.3 F (36.8 C) 97.8 F (36.6 C)  TempSrc: Oral     SpO2:  96% 96% 96%  Weight:    113.4 kg (249 lb 14.4 oz)  Height:        Intake/Output Summary (Last 24 hours) at 07/15/17 1226 Last data filed at 07/15/17 0551  Gross per 24 hour  Intake              390 ml  Output                0 ml  Net              390 ml   Filed Weights   07/12/17 1332 07/13/17 0526 07/15/17 0550  Weight: 108 kg (238 lb) 111.3 kg (245 lb 4.8 oz) 113.4 kg (249 lb 14.4 oz)    Examination:  Obese pleasant oriented No JVD No chills No records Mild wheeze bilaterally no rales no rhonchi overall improved No focal neuro deficit  Data Reviewed: I have personally reviewed following labs and imaging studies  CBC:  Recent Labs Lab 07/12/17 1339 07/13/17 0240 07/14/17 0538 07/15/17 0931  WBC 15.6*  12.9* 14.3* 11.8*  NEUTROABS  --   --   --  5.4  HGB 14.7 12.4 11.2* 11.8*  HCT 44.4 37.5 34.8* 36.4  MCV 86.2 85.6 86.6 86.9  PLT 322 234 229 106   Basic Metabolic Panel:  Recent Labs Lab 07/12/17 1339 07/13/17 0240 07/14/17 0538 07/15/17 0413  NA 138 136 139 138  K 3.6 4.1 3.7 3.6  CL 99* 104 104 100*  CO2 24 23 28 31   GLUCOSE 214* 248* 225* 188*  BUN 17 14 10 14   CREATININE 1.08* 0.83 0.83 0.93  CALCIUM 9.2 7.7* 8.0* 8.5*   GFR: Estimated Creatinine Clearance: 84.5 mL/min (by C-G formula based on SCr of 0.93 mg/dL). Liver Function Tests:  Recent Labs Lab 07/15/17 0413  AST 28  ALT 45  ALKPHOS 37*  BILITOT 0.5  PROT 5.6*  ALBUMIN 2.9*   No results for input(s): LIPASE, AMYLASE in the last 168 hours. No results for input(s): AMMONIA  in the last 168 hours. Coagulation Profile: No results for input(s): INR, PROTIME in the last 168 hours. Cardiac Enzymes:  Recent Labs Lab 07/13/17 0240  TROPONINI <0.03   BNP (last 3 results) No results for input(s): PROBNP in the last 8760 hours. HbA1C: No results for input(s): HGBA1C in the last 72 hours. CBG:  Recent Labs Lab 07/14/17 1116 07/14/17 1641 07/14/17 2150 07/15/17 0648 07/15/17 1111  GLUCAP 185* 190* 182* 151* 145*   Lipid Profile: No results for input(s): CHOL, HDL, LDLCALC, TRIG, CHOLHDL, LDLDIRECT in the last 72 hours. Thyroid Function Tests: No results for input(s): TSH, T4TOTAL, FREET4, T3FREE, THYROIDAB in the last 72 hours. Anemia Panel: No results for input(s): VITAMINB12, FOLATE, FERRITIN, TIBC, IRON, RETICCTPCT in the last 72 hours. Urine analysis:    Component Value Date/Time   COLORURINE YELLOW 07/12/2017 1722   APPEARANCEUR HAZY (A) 07/12/2017 1722   LABSPEC 1.027 07/12/2017 1722   PHURINE 5.0 07/12/2017 1722   GLUCOSEU NEGATIVE 07/12/2017 1722   HGBUR NEGATIVE 07/12/2017 1722   BILIRUBINUR NEGATIVE 07/12/2017 1722   KETONESUR NEGATIVE 07/12/2017 1722   PROTEINUR 100 (A) 07/12/2017 1722   UROBILINOGEN 0.2 08/24/2009 0903   NITRITE NEGATIVE 07/12/2017 1722   LEUKOCYTESUR LARGE (A) 07/12/2017 1722       Radiology Studies: Ct Angio Chest Pe W Or Wo Contrast  Result Date: 07/13/2017 CLINICAL DATA:  Shortness of breath and chest pain EXAM: CT ANGIOGRAPHY CHEST WITH CONTRAST TECHNIQUE: Multidetector CT imaging of the chest was performed using the standard protocol during bolus administration of intravenous contrast. Multiplanar CT image reconstructions and MIPs were obtained to evaluate the vascular anatomy. CONTRAST:  100 mL Isovue 370 nonionic COMPARISON:  Chest CT July 04, 2017; chest radiograph July 12, 2017 FINDINGS: Cardiovascular: There is no demonstrable pulmonary embolus. There is no appreciable thoracic aortic aneurysm or  dissection. Visualized great vessels appear unremarkable. There is atherosclerotic calcification in the aortic arch region. There are mild foci of coronary artery calcification. Pericardium does appreciably thickened. Mediastinum/Nodes: No thyroid lesions are evident. There are occasional subcentimeter lymph nodes. There is no adenopathy in the thoracic region by size criteria. No esophageal lesions are appreciable. Lungs/Pleura: There is elevation of the left hemidiaphragm with atelectatic change in the left base. A 4 mm nodular opacity in the posterior segment right lower lobe is seen on axial slice 72 series 7, better seen on recent CT. Differences in appearance in this area likely due to mild motion in the lung bases on the current examination. No new  parenchymal nodular lesion evident. No consolidation or edema evident. No appreciable pleural effusion or pleural thickening. Upper Abdomen: There is incomplete visualization of an apparent adenoma arising from the left adrenal measuring 9 x 8 mm. Visualized upper abdominal structures otherwise appear unremarkable. Musculoskeletal: There is mild degenerative change in the thoracic spine. There are no blastic or lytic bone lesions. Review of the MIP images confirms the above findings. IMPRESSION: 1.  No demonstrable pulmonary embolus. 2. Foci of atherosclerotic calcification in the aorta. No aneurysm or dissection in the aorta. Mild coronary artery calcification noted. 3. Stable mild elevation of the left hemidiaphragm with left base atelectasis. Stable 4 mm nodular opacity posterior right base, better seen on recent prior study due to mild motion at this time. No follow-up needed if patient is low-risk. Non-contrast chest CT can be considered in 12 months if patient is high-risk. This recommendation follows the consensus statement: Guidelines for Management of Incidental Pulmonary Nodules Detected on CT Images: From the Fleischner Society 2017; Radiology 2017;  284:228-243. 4.  No evident adenopathy. 5. Incomplete visualization of apparent 9 x 8 mm left adrenal adenoma. Aortic Atherosclerosis (ICD10-I70.0). Electronically Signed   By: Lowella Grip III M.D.   On: 07/13/2017 14:07    Scheduled Meds: . aspirin EC  81 mg Oral Daily  . enoxaparin (LOVENOX) injection  55 mg Subcutaneous Q24H  . FLUoxetine  20 mg Oral Daily  . hydrochlorothiazide  12.5 mg Oral Daily  . insulin aspart  0-5 Units Subcutaneous QHS  . insulin aspart  0-9 Units Subcutaneous TID WC  . levofloxacin  500 mg Oral Q24H  . lisinopril  10 mg Oral QHS  . pravastatin  10 mg Oral q1800  . verapamil  240 mg Oral Daily   Continuous Infusions:   LOS: 2 days   Time spent: [place], MD [facility] ([facility]   If 7PM-7AM, please contact night-coverage [email or web address] Password [facility] 07/15/2017, 12:26 PM

## 2017-07-16 LAB — CBC WITH DIFFERENTIAL/PLATELET
Basophils Absolute: 0 10*3/uL (ref 0.0–0.1)
Basophils Relative: 0 %
Eosinophils Absolute: 0.5 10*3/uL (ref 0.0–0.7)
Eosinophils Relative: 4 %
HCT: 37.1 % (ref 36.0–46.0)
Hemoglobin: 12.1 g/dL (ref 12.0–15.0)
Lymphocytes Relative: 41 %
Lymphs Abs: 4.3 10*3/uL — ABNORMAL HIGH (ref 0.7–4.0)
MCH: 28.3 pg (ref 26.0–34.0)
MCHC: 32.6 g/dL (ref 30.0–36.0)
MCV: 86.9 fL (ref 78.0–100.0)
Monocytes Absolute: 0.8 10*3/uL (ref 0.1–1.0)
Monocytes Relative: 8 %
Neutro Abs: 4.7 10*3/uL (ref 1.7–7.7)
Neutrophils Relative %: 47 %
Platelets: 222 10*3/uL (ref 150–400)
RBC: 4.27 MIL/uL (ref 3.87–5.11)
RDW: 14.7 % (ref 11.5–15.5)
WBC: 10.3 10*3/uL (ref 4.0–10.5)

## 2017-07-16 LAB — PROCALCITONIN: Procalcitonin: <0.1 ng/mL

## 2017-07-16 LAB — GLUCOSE, CAPILLARY: Glucose-Capillary: 191 mg/dL — ABNORMAL HIGH (ref 65–99)

## 2017-07-16 MED ORDER — LEVOFLOXACIN 500 MG PO TABS: 500.0000 mg | ORAL_TABLET | ORAL | 0 refills | Status: DC

## 2017-07-16 NOTE — Care Management Note (Signed)
Case Management Note  Patient Details  Name: Samantha [name] MRN: [id number] Date of Birth: 11/04/1956  Subjective/Objective:  Pt admitted on 07/12/17 with cough and chest pain.  PTA, pt independent, lives at home with husband.  PCP is Dr. Helane Rima.                 Action/Plan: Pt is uninsured, and states she normally can afford her medications.  She is concerned about the cost of Levaquin.  Pt is eligible for medication assistance through [employer] letter given with explanation of program benefits.    Expected Discharge Date:  07/16/17               Expected Discharge Plan:  Home/Self Care  In-House Referral:     Discharge planning Services  CM Consult, [facility]  Post Acute Care Choice:    Choice offered to:     DME Arranged:    DME Agency:     HH Arranged:    HH Agency:     Status of Service:  Completed, signed off  If discussed at [employer], dates discussed:    Additional Comments:  Ella Bodo, RN 07/16/2017, 11:42 AM

## 2017-07-16 NOTE — Discharge Summary (Signed)
Physician Discharge Summary  Samantha [name] HWE:[id number] DOB: Mar 29, 1956 DOA: 07/12/2017  PCP: Helane Rima, MD  Admit date: 07/12/2017 Discharge date: 07/16/2017  Time spent: 35 minutes  Recommendations for Outpatient Follow-up:  Needs bmet and cbc 1 week Needs Cardiology referral on d/c home--to call Dr. Virgina Jock office and schedule complete 2 more doses Levaquin Increased this admit hctz 12.5-->25 Need low dose CT lung ~ 3 mo--has small nodule Consider [place] adrenals ~ 1 mo or so--has htn and might have an incidentaloma as CT scan showed a 9 x 8 mm adenoma   Discharge Diagnoses:  Principal Problem:   Chest pain Active Problems:   Diabetes ([place])   Essential hypertension   PNA (pneumonia)   Discharge Condition: improved  Diet recommendation:  hh low salt  Filed Weights   07/13/17 0526 07/15/17 0550 07/16/17 0458  Weight: 111.3 kg (245 lb 4.8 oz) 113.4 kg (249 lb 14.4 oz) 110.6 kg (243 lb 12.8 oz)    History of present illness:  61 ? htn Body mass index is 35.86 kg/m.  DM ty II with nephropathy hld  Reported Bipolar admission 9.17 for CP--ruled out by CE x 3                Prior cath 9.2006 with large fillling defect/small MI [large thrombus in L Circ]                Supposed to be on ASa/Plavix?  Admitted 8.16 23 weeks malaise cough sputum-7/25 had CXR=LLL infiltrate On admit HR 92, wbc 15, Lactic acid 5.5 Started zosyn/Vanc and IVF Troponin neg--echo ordered   Hospital Course:  Chr bronchitis/failed OP PNa Rx ? Viral illness Patient used to work in a factory with a lot of dust-? Occupational pneumoconiosis as underlying risk factor? Pulmonary nodules on CT                Patient underwent CTA negative for pulmonary embolism                Continue to treat broad spectrum antibiotics ceftriaxone/azithromycin and  As PCT <0.10, narrow to po levaquin 8.19                WBC ~14-->12                Can continue DuoNeb for now                Needs  outpatient low-dose CT scan chest in 6 months per guidelines                Possible d/c 24-48 if better  PVCs                No recurrences  Bipolar                Continue Prozac 20 p.m.  Diabetes mellitus type 2 + nephropathy                PTCA admission meds Amaryl 2 mg twice a day                Sugars probably elevated in the 300s secondary to retreatment with steroids                Continue sliding scale insulin --sugars 140-180  Resumed home med son d/c  BMI 35                Needs outpatient bariatric counseling  Hyperlipidemia  HTN Diastolic heart  failure, EF 55-60%                Continue lisinopril 10, HCTZ 12.5--->increased this admit to 25 daily, verapamil 240                Hold further escalation diuresis-improving  OP Cardiology referral placed  incidental Adrenal nodule                Needs OP characterization once stable  Consultations:  none  Discharge Exam: Vitals:   07/15/17 2149 07/16/17 0458  BP: (!) 142/59 (!) 158/56  Pulse: 61 (!) 56  Resp: 18 19  Temp: 98.2 F (36.8 C) 98 F (36.7 C)  SpO2: 95% 98%    General: alert smiling and ready for home Cardiovascular:  s1 s2 hsm, LLSE-->axilla, mild JVd of 5, 1 + LE edema Respiratory:  Mild crackle posteriorly abd soft nt nd no rebound no guard  Discharge Instructions   Discharge Instructions    Call MD for:  difficulty breathing, headache or visual disturbances    Complete by:  As directed    Call MD for:  extreme fatigue    Complete by:  As directed    Call MD for:  persistant dizziness or light-headedness    Complete by:  As directed    Diet - low sodium heart healthy    Complete by:  As directed    Discharge instructions    Complete by:  As directed    finish 2 more days of Levaquin orally.  Need HCTZ increase in dose from 12.5--->25 daily and re-assess as OP needs Would suggest Cardiology referral which I have placed for you--You have likely some mild changes of heart  failure from your Hypertension   Increase activity slowly    Complete by:  As directed      Current Discharge Medication List    START taking these medications   Details  levofloxacin (LEVAQUIN) 500 MG tablet Take 1 tablet (500 mg total) by mouth daily. Qty: 2 tablet, Refills: 0      CONTINUE these medications which have NOT CHANGED   Details  albuterol (PROVENTIL HFA;VENTOLIN HFA) 108 (90 Base) MCG/ACT inhaler Inhale 2 puffs into the lungs every 6 (six) hours as needed for wheezing or shortness of breath. Qty: 1 Inhaler, Refills: 0    aspirin 81 MG tablet Take 81 mg by mouth every morning.     azelastine (ASTELIN) 0.1 % nasal spray Place into both nostrils daily as needed. Use in each nostril as directed     benzonatate (TESSALON) 100 MG capsule Take by mouth 3 (three) times daily as needed for cough.    Calcium Citrate-Vitamin D (CALCIUM + D PO) Take 1 tablet by mouth every morning.     FLUoxetine (PROZAC) 20 MG capsule Take 20 mg by mouth every morning.     glimepiride (AMARYL) 2 MG tablet Take 2 mg by mouth 2 (two) times daily.     ipratropium-albuterol (DUONEB) 0.5-2.5 (3) MG/3ML SOLN Inhale 3 mLs into the lungs daily as needed (wheezing).     lisinopril (PRINIVIL,ZESTRIL) 10 MG tablet Take 10 mg by mouth at bedtime.     lovastatin (MEVACOR) 20 MG tablet Take 20 mg by mouth at bedtime.    meloxicam (MOBIC) 15 MG tablet Take 15 mg by mouth daily as needed for pain.     metFORMIN (GLUCOPHAGE) 1000 MG tablet Take 1,000 mg by mouth 2 (two) times daily with a meal.  nitroGLYCERIN (NITROSTAT) 0.4 MG SL tablet Place 1 tablet (0.4 mg total) under the tongue every 5 (five) minutes as needed for chest pain. Qty: 30 tablet, Refills: 12    Omega-3 Fatty Acids (FISH OIL) 1000 MG CAPS Take 1,000 mg by mouth every morning.     verapamil (VERELAN PM) 240 MG 24 hr capsule Take 240 mg by mouth every morning.       STOP taking these medications     hydrochlorothiazide  (MICROZIDE) 12.5 MG capsule        Allergies  Allergen Reactions  . Iohexol Anaphylaxis     Code: SOB, Desc: respiratory distress-ASM, Onset Date: [id number]       The results of significant diagnostics from this hospitalization (including imaging, microbiology, ancillary and laboratory) are listed below for reference.    Significant Diagnostic Studies: Dg Chest 2 View  Result Date: 07/12/2017 CLINICAL DATA:  Chest pain EXAM: CHEST  2 VIEW COMPARISON:  07/05/2017 FINDINGS: There is chronic asymmetric elevation of the left hemidiaphragm. Normal heart size. No pleural effusion or edema. No airspace opacities. IMPRESSION: 1. No acute cardiopulmonary abnormalities. Electronically Signed   By: Signa Kell M.D.   On: 07/12/2017 14:33   Ct Chest Wo Contrast  Result Date: 07/05/2017 CLINICAL DATA:  Cough, chest pain. EXAM: CT CHEST WITHOUT CONTRAST TECHNIQUE: Multidetector CT imaging of the chest was performed following the standard protocol without IV contrast. COMPARISON:  Radiographs of August 09, 2016. FINDINGS: Cardiovascular: No evidence of thoracic aortic aneurysm. Atherosclerosis of thoracic aorta is noted. Normal heart size. No pericardial effusion. Mediastinum/Nodes: No enlarged mediastinal or axillary lymph nodes. Thyroid gland, trachea, and esophagus demonstrate no significant findings. Lungs/Pleura: No pneumothorax or pleural effusion is noted. Minimal left basilar subsegmental atelectasis is noted. 4 mm nodule is seen just above the right hemidiaphragm best seen on image number 100 of series 5. Upper Abdomen: No acute abnormality. Musculoskeletal: No chest wall mass or suspicious bone lesions identified. IMPRESSION: Minimal left basilar subsegmental atelectasis. 4 mm nodule seen in right lower lobe. No follow-up needed if patient is low-risk. Non-contrast chest CT can be considered in 12 months if patient is high-risk. This recommendation follows the consensus statement: Guidelines for  Management of Incidental Pulmonary Nodules Detected on CT Images: From the Fleischner Society 2017; Radiology 2017; 284:228-243. Aortic Atherosclerosis (ICD10-I70.0). Electronically Signed   By: Lupita Raider, M.D.   On: 07/05/2017 08:25   Ct Angio Chest Pe W Or Wo Contrast  Result Date: 07/13/2017 CLINICAL DATA:  Shortness of breath and chest pain EXAM: CT ANGIOGRAPHY CHEST WITH CONTRAST TECHNIQUE: Multidetector CT imaging of the chest was performed using the standard protocol during bolus administration of intravenous contrast. Multiplanar CT image reconstructions and MIPs were obtained to evaluate the vascular anatomy. CONTRAST:  100 mL Isovue 370 nonionic COMPARISON:  Chest CT July 04, 2017; chest radiograph July 12, 2017 FINDINGS: Cardiovascular: There is no demonstrable pulmonary embolus. There is no appreciable thoracic aortic aneurysm or dissection. Visualized great vessels appear unremarkable. There is atherosclerotic calcification in the aortic arch region. There are mild foci of coronary artery calcification. Pericardium does appreciably thickened. Mediastinum/Nodes: No thyroid lesions are evident. There are occasional subcentimeter lymph nodes. There is no adenopathy in the thoracic region by size criteria. No esophageal lesions are appreciable. Lungs/Pleura: There is elevation of the left hemidiaphragm with atelectatic change in the left base. A 4 mm nodular opacity in the posterior segment right lower lobe is seen on axial slice 72 series 7,  better seen on recent CT. Differences in appearance in this area likely due to mild motion in the lung bases on the current examination. No new parenchymal nodular lesion evident. No consolidation or edema evident. No appreciable pleural effusion or pleural thickening. Upper Abdomen: There is incomplete visualization of an apparent adenoma arising from the left adrenal measuring 9 x 8 mm. Visualized upper abdominal structures otherwise appear unremarkable.  Musculoskeletal: There is mild degenerative change in the thoracic spine. There are no blastic or lytic bone lesions. Review of the MIP images confirms the above findings. IMPRESSION: 1.  No demonstrable pulmonary embolus. 2. Foci of atherosclerotic calcification in the aorta. No aneurysm or dissection in the aorta. Mild coronary artery calcification noted. 3. Stable mild elevation of the left hemidiaphragm with left base atelectasis. Stable 4 mm nodular opacity posterior right base, better seen on recent prior study due to mild motion at this time. No follow-up needed if patient is low-risk. Non-contrast chest CT can be considered in 12 months if patient is high-risk. This recommendation follows the consensus statement: Guidelines for Management of Incidental Pulmonary Nodules Detected on CT Images: From the Fleischner Society 2017; Radiology 2017; 284:228-243. 4.  No evident adenopathy. 5. Incomplete visualization of apparent 9 x 8 mm left adrenal adenoma. Aortic Atherosclerosis (ICD10-I70.0). Electronically Signed   By: Lowella Grip III M.D.   On: 07/13/2017 14:07   Dg Chest Portable 1 View  Result Date: 07/05/2017 CLINICAL DATA:  Acute cough and rhonchi.  Near syncope. EXAM: PORTABLE CHEST 1 VIEW COMPARISON:  07/04/2017 CT and 08/09/2016 chest radiograph FINDINGS: Cardiomediastinal silhouette is unchanged. Elevation of the left hemidiaphragm is again noted. There is no evidence of focal airspace disease, pulmonary edema, suspicious pulmonary nodule/mass, pleural effusion, or pneumothorax. No acute bony abnormalities are identified. IMPRESSION: No evidence of acute cardiopulmonary disease. Electronically Signed   By: Margarette Canada M.D.   On: 07/05/2017 15:08    Microbiology: Recent Results (from the past 240 hour(s))  Blood Culture (routine x 2)     Status: None (Preliminary result)   Collection Time: 07/12/17  5:08 PM  Result Value Ref Range Status   Specimen Description BLOOD RIGHT FOREARM  Final    Special Requests   Final    BOTTLES DRAWN AEROBIC AND ANAEROBIC Blood Culture adequate volume   Culture NO GROWTH 3 DAYS  Final   Report Status PENDING  Incomplete  Blood Culture (routine x 2)     Status: None (Preliminary result)   Collection Time: 07/12/17  5:22 PM  Result Value Ref Range Status   Specimen Description BLOOD LEFT FOREARM  Final   Special Requests   Final    BOTTLES DRAWN AEROBIC AND ANAEROBIC Blood Culture adequate volume   Culture NO GROWTH 3 DAYS  Final   Report Status PENDING  Incomplete  Urine culture     Status: None   Collection Time: 07/12/17  5:22 PM  Result Value Ref Range Status   Specimen Description URINE, RANDOM  Final   Special Requests NONE  Final   Culture NO GROWTH  Final   Report Status 07/13/2017 FINAL  Final  Respiratory Panel by PCR     Status: None   Collection Time: 07/12/17 11:47 PM  Result Value Ref Range Status   Adenovirus NOT DETECTED NOT DETECTED Final   Coronavirus 229E NOT DETECTED NOT DETECTED Final   Coronavirus HKU1 NOT DETECTED NOT DETECTED Final   Coronavirus NL63 NOT DETECTED NOT DETECTED Final   Coronavirus OC43  NOT DETECTED NOT DETECTED Final   Metapneumovirus NOT DETECTED NOT DETECTED Final   Rhinovirus / Enterovirus NOT DETECTED NOT DETECTED Final   Influenza A NOT DETECTED NOT DETECTED Final   Influenza A H1 NOT DETECTED NOT DETECTED Final   Influenza A H1 2009 NOT DETECTED NOT DETECTED Final   Influenza A H3 NOT DETECTED NOT DETECTED Final   Influenza B NOT DETECTED NOT DETECTED Final   Parainfluenza Virus 1 NOT DETECTED NOT DETECTED Final   Parainfluenza Virus 2 NOT DETECTED NOT DETECTED Final   Parainfluenza Virus 3 NOT DETECTED NOT DETECTED Final   Parainfluenza Virus 4 NOT DETECTED NOT DETECTED Final   Respiratory Syncytial Virus NOT DETECTED NOT DETECTED Final   Bordetella pertussis NOT DETECTED NOT DETECTED Final   Chlamydophila pneumoniae NOT DETECTED NOT DETECTED Final   Mycoplasma pneumoniae NOT DETECTED  NOT DETECTED Final     Labs: Basic Metabolic Panel:  Recent Labs Lab 07/12/17 1339 07/13/17 0240 07/14/17 0538 07/15/17 0413  NA 138 136 139 138  K 3.6 4.1 3.7 3.6  CL 99* 104 104 100*  CO2 '24 23 28 31  '$ GLUCOSE 214* 248* 225* 188*  BUN '17 14 10 14  '$ CREATININE 1.08* 0.83 0.83 0.93  CALCIUM 9.2 7.7* 8.0* 8.5*   Liver Function Tests:  Recent Labs Lab 07/15/17 0413  AST 28  ALT 45  ALKPHOS 37*  BILITOT 0.5  PROT 5.6*  ALBUMIN 2.9*   No results for input(s): LIPASE, AMYLASE in the last 168 hours. No results for input(s): AMMONIA in the last 168 hours. CBC:  Recent Labs Lab 07/12/17 1339 07/13/17 0240 07/14/17 0538 07/15/17 0931 07/16/17 0524  WBC 15.6* 12.9* 14.3* 11.8* 10.3  NEUTROABS  --   --   --  5.4 4.7  HGB 14.7 12.4 11.2* 11.8* 12.1  HCT 44.4 37.5 34.8* 36.4 37.1  MCV 86.2 85.6 86.6 86.9 86.9  PLT 322 234 229 230 222   Cardiac Enzymes:  Recent Labs Lab 07/13/17 0240  TROPONINI <0.03   BNP: BNP (last 3 results)  Recent Labs  07/15/17 0413  BNP 139.0*    ProBNP (last 3 results) No results for input(s): PROBNP in the last 8760 hours.  CBG:  Recent Labs Lab 07/15/17 0648 07/15/17 1111 07/15/17 1629 07/15/17 2152 07/16/17 0737  GLUCAP 151* 145* 161* 174* 191*       Signed:  Nita Sells MD   [facility] 07/16/2017, 10:04 AM

## 2017-07-17 LAB — CULTURE, BLOOD (ROUTINE X 2)
Culture: NO GROWTH
Culture: NO GROWTH
Special Requests: ADEQUATE
Special Requests: ADEQUATE

## 2017-08-14 ENCOUNTER — Ambulatory Visit (INDEPENDENT_AMBULATORY_CARE_PROVIDER_SITE_OTHER): Payer: Self-pay | Admitting: Emergency Medicine

## 2017-08-14 ENCOUNTER — Encounter: Payer: Self-pay | Admitting: Emergency Medicine

## 2017-08-14 ENCOUNTER — Telehealth: Payer: Self-pay | Admitting: Emergency Medicine

## 2017-08-14 VITALS — BP 150/80 | HR 84 | Ht 70.0 in | Wt 243.0 lb

## 2017-08-14 DIAGNOSIS — R911 Solitary pulmonary nodule: Secondary | ICD-10-CM

## 2017-08-14 DIAGNOSIS — R0600 Dyspnea, unspecified: Secondary | ICD-10-CM | POA: Insufficient documentation

## 2017-08-14 DIAGNOSIS — R05 Cough: Secondary | ICD-10-CM

## 2017-08-14 DIAGNOSIS — R079 Chest pain, unspecified: Secondary | ICD-10-CM

## 2017-08-14 DIAGNOSIS — R059 Cough, unspecified: Secondary | ICD-10-CM | POA: Insufficient documentation

## 2017-08-14 MED ORDER — PANTOPRAZOLE SODIUM 40 MG PO TBEC: 40.0000 mg | DELAYED_RELEASE_TABLET | Freq: Every day | ORAL | 5 refills | Status: DC

## 2017-08-14 MED ORDER — VALSARTAN 160 MG PO TABS: 160.0000 mg | ORAL_TABLET | Freq: Every day | ORAL | 5 refills | Status: DC

## 2017-08-14 NOTE — Assessment & Plan Note (Signed)
With a reassuring cardiac evaluation. Her catheterization was in 2009, question whether she may require repeat cardiac evaluation if no other etiology is identified. No suspicious that this may be GERD based on its pattern and radiation to the back, association with cough and wheezing. I will treat her empirically with pantoprazole 40 mg, assess response

## 2017-08-14 NOTE — Assessment & Plan Note (Signed)
Often associated with her chest pain. She will have full pulmonary function testing. I will check a sniff test given her elevated left hemidiaphragm on imaging.

## 2017-08-14 NOTE — Telephone Encounter (Signed)
Spoke with patient's husband. He stated that she needed a note that stated she had an appt today. He will come by the office this afternoon to pick this up.   Letter will be printed and left up front for pickup.

## 2017-08-14 NOTE — Patient Instructions (Signed)
Please continue your fluticasone nasal spray, Astelin nasal spray, Zyrtec as you have been taking them Please stop lisinopril for now. We will start valsartan 160 mg once a day until your next visit Please start pantoprazole 40 mg once a day until next visit. Take this medication either 1 hour before or 1 hour after eating We will perform pulmonary function testing We will perform a sniff test Get your adrenal gland evaluated as planned.  Keep albuterol available to use 2 puffs as needed for shortness of breath or wheezing Follow with Dr Lamonte Sakai 1 month or next available to review your testing and to assess your status on the new medications

## 2017-08-14 NOTE — Assessment & Plan Note (Signed)
She has a history of allergic rhinitis that appears to be fairly well controlled on her current regimen. I suspect that she does have GERD as mentioned. We will treat this empirically. I also would like to stop her ACE inhibitor, changed to valsartan. Finally she will need pulmonary function testing to rule out lower airways obstruction.

## 2017-08-14 NOTE — Assessment & Plan Note (Addendum)
She has a 4 mm right lower lobe pulmonary nodule noted on CT scan of the chest. She is low risk, never smoker. Should not need serial scans or follow-up unless clinical change

## 2017-08-14 NOTE — Progress Notes (Signed)
Subjective:    Patient ID: Samantha [name], female    DOB: 11-30-1955, 61 y.o.   MRN: [id number]  HPI 61 year old never smoker with a history of hypertension, diabetes, hyperlipidemia, allergic rhinitis. She is referred today for evaluation of dyspnea, wheeze, mid-sternal stabbing CP to her back, wheeze. This dates back several years. She has been seen by cardiology, had a clean cath in 2009. ECG's have been normal. Has used albuterol with some relief but not full resolution. She may have had a URI in July, associated w cough and SOB. Treated w prednisone, then returned and received abx, several courses. CT chest 8/9 reviewed by me > minimal LLL atx, 34m RLL nodule. Experienced a syncopal episode 07/05/17, Ultimately admitted 07/12/17 with same CP and SOB. Repeat CT scan 07/13/17 reviewed by me > no PE, elevated L HD, L basilar atx.   She has dry cough for several years. She has allergies, seem to be improved on current meds. She has never been treated for GERD.                                                                                                                                                                                                                  She is currently on Astelin nasal spray, Zyrtec, fluticasone nasal spray, lisinopril 20 mg daily.   Review of Systems  Constitutional: Negative for fever and unexpected weight change.  HENT: Positive for congestion, dental problem and ear pain. Negative for nosebleeds, postnasal drip, rhinorrhea, sinus pressure, sneezing, sore throat and trouble swallowing.   Eyes: Negative for redness and itching.  Respiratory: Positive for cough and shortness of breath. Negative for chest tightness and wheezing.   Cardiovascular: Positive for chest pain and palpitations. Negative for leg swelling.  Gastrointestinal: Negative for nausea and vomiting.  Genitourinary: Negative for dysuria.  Musculoskeletal: Negative for joint swelling.  Skin: Negative  for rash.  Neurological: Positive for headaches.  Hematological: Does not bruise/bleed easily.  Psychiatric/Behavioral: Negative for dysphoric mood. The patient is not nervous/anxious.     Past Medical History:  Diagnosis Date  . Arthritis   . Dyslipidemia   . Heart murmur 2011  . Left bundle branch block 2011  . Type II or unspecified type diabetes mellitus without mention of complication, not stated as uncontrolled   . Unspecified essential hypertension      Family History  Problem Relation Age of Onset  . Diabetes Mother   . Hypertension Mother   . CAD Mother   .  Hypertension Father   . Diabetes Father   . CAD Father   . Diabetes Brother   . Hyperlipidemia Brother   . Hypertension Brother   . CAD Brother   . Diabetes Sister   . Hypertension Sister      Social History   Social History  . Marital status: Married    Spouse name: N/A  . Number of children: N/A  . Years of education: N/A   Occupational History  . Not on file.   Social History Main Topics  . Smoking status: Never Smoker  . Smokeless tobacco: Never Used  . Alcohol use No  . Drug use: No  . Sexual activity: Not on file   Other Topics Concern  . Not on file   Social History Narrative  . No narrative on file     Allergies  Allergen Reactions  . Iohexol Anaphylaxis     Code: SOB, Desc: respiratory distress-ASM, Onset Date: [id number]      Outpatient Medications Prior to Visit  Medication Sig Dispense Refill  . albuterol (PROVENTIL HFA;VENTOLIN HFA) 108 (90 Base) MCG/ACT inhaler Inhale 2 puffs into the lungs every 6 (six) hours as needed for wheezing or shortness of breath. 1 Inhaler 0  . aspirin 81 MG tablet Take 81 mg by mouth every morning.     Marland Kitchen azelastine (ASTELIN) 0.1 % nasal spray Place into both nostrils daily as needed. Use in each nostril as directed     . benzonatate (TESSALON) 100 MG capsule Take by mouth 3 (three) times daily as needed for cough.    . Calcium Citrate-Vitamin D  (CALCIUM + D PO) Take 1 tablet by mouth every morning.     Marland Kitchen FLUoxetine (PROZAC) 20 MG capsule Take 20 mg by mouth every morning.     Marland Kitchen glimepiride (AMARYL) 2 MG tablet Take 2 mg by mouth 2 (two) times daily.     Marland Kitchen ipratropium-albuterol (DUONEB) 0.5-2.5 (3) MG/3ML SOLN Inhale 3 mLs into the lungs daily as needed (wheezing).     Marland Kitchen lovastatin (MEVACOR) 20 MG tablet Take 20 mg by mouth at bedtime.    . meloxicam (MOBIC) 15 MG tablet Take 15 mg by mouth daily as needed for pain.     . metFORMIN (GLUCOPHAGE) 1000 MG tablet Take 1,000 mg by mouth 2 (two) times daily with a meal.    . nitroGLYCERIN (NITROSTAT) 0.4 MG SL tablet Place 1 tablet (0.4 mg total) under the tongue every 5 (five) minutes as needed for chest pain. 30 tablet 12  . Omega-3 Fatty Acids (FISH OIL) 1000 MG CAPS Take 1,000 mg by mouth every morning.     . verapamil (VERELAN PM) 240 MG 24 hr capsule Take 240 mg by mouth every morning.     Marland Kitchen levofloxacin (LEVAQUIN) 500 MG tablet Take 1 tablet (500 mg total) by mouth daily. 2 tablet 0  . lisinopril (PRINIVIL,ZESTRIL) 10 MG tablet Take 10 mg by mouth at bedtime.      No facility-administered medications prior to visit.         Objective:   Physical Exam Vitals:   08/14/17 1054 08/14/17 1055  BP:  (!) 150/80  Pulse:  84  SpO2:  96%  Weight: 243 lb (110.2 kg)   Height: '5\' 10"'$  (1.778 m)    Gen: Pleasant, overweight, in no distress,  normal affect  ENT: No lesions,  mouth clear,  oropharynx clear, no postnasal drip  Neck: No JVD, no stridor  Lungs: No use  of accessory muscles, clear without rales or rhonchi, no crackles or wheezes  Cardiovascular: RRR, heart sounds normal, no murmur or gallops, no peripheral edema  Musculoskeletal: No deformities, no cyanosis or clubbing  Neuro: alert, non focal  Skin: Warm, no lesions or rashes    CT  Chest 07/13/17 --  COMPARISON:  Chest CT July 04, 2017; chest radiograph July 12, 2017  FINDINGS: Cardiovascular: There is no  demonstrable pulmonary embolus. There is no appreciable thoracic aortic aneurysm or dissection. Visualized great vessels appear unremarkable. There is atherosclerotic calcification in the aortic arch region. There are mild foci of coronary artery calcification. Pericardium does appreciably thickened.  Mediastinum/Nodes: No thyroid lesions are evident. There are occasional subcentimeter lymph nodes. There is no adenopathy in the thoracic region by size criteria. No esophageal lesions are appreciable.  Lungs/Pleura: There is elevation of the left hemidiaphragm with atelectatic change in the left base. A 4 mm nodular opacity in the posterior segment right lower lobe is seen on axial slice 72 series 7, better seen on recent CT. Differences in appearance in this area likely due to mild motion in the lung bases on the current examination. No new parenchymal nodular lesion evident. No consolidation or edema evident. No appreciable pleural effusion or pleural thickening.  Upper Abdomen: There is incomplete visualization of an apparent adenoma arising from the left adrenal measuring 9 x 8 mm. Visualized upper abdominal structures otherwise appear unremarkable.  Musculoskeletal: There is mild degenerative change in the thoracic spine. There are no blastic or lytic bone lesions.  Review of the MIP images confirms the above findings.  IMPRESSION: 1.  No demonstrable pulmonary embolus.  2. Foci of atherosclerotic calcification in the aorta. No aneurysm or dissection in the aorta. Mild coronary artery calcification noted.  3. Stable mild elevation of the left hemidiaphragm with left base atelectasis. Stable 4 mm nodular opacity posterior right base, better seen on recent prior study due to mild motion at this time. No follow-up needed if patient is low-risk. Non-contrast chest CT can be considered in 12 months if patient is high-risk. This recommendation follows the consensus  statement: Guidelines for Management of Incidental Pulmonary Nodules Detected on CT Images: From the Fleischner Society 2017; Radiology 2017; 284:228-243.  4.  No evident adenopathy.  5. Incomplete visualization of apparent 9 x 8 mm left adrenal adenoma.      Assessment & Plan:  Chest pain With a reassuring cardiac evaluation. Her catheterization was in 2009, question whether she may require repeat cardiac evaluation if no other etiology is identified. No suspicious that this may be GERD based on its pattern and radiation to the back, association with cough and wheezing. I will treat her empirically with pantoprazole 40 mg, assess response  Cough She has a history of allergic rhinitis that appears to be fairly well controlled on her current regimen. I suspect that she does have GERD as mentioned. We will treat this empirically. I also would like to stop her ACE inhibitor, changed to valsartan. Finally she will need pulmonary function testing to rule out lower airways obstruction.  Dyspnea Often associated with her chest pain. She will have full pulmonary function testing. I will check a sniff test given her elevated left hemidiaphragm on imaging.  Pulmonary nodule, right She has a 4 mm right lower lobe pulmonary nodule noted on CT scan of the chest. She is low risk, never smoker. Should not need serial scans or follow-up unless clinical change  Baltazar Apo, MD, PhD 08/14/2017,  11:31 AM [facility] [phone] or if no answer [phone]

## 2017-08-20 ENCOUNTER — Ambulatory Visit (HOSPITAL_COMMUNITY)
Admission: RE | Admit: 2017-08-20 | Discharge: 2017-08-20 | Disposition: A | Discharge status: Home / Self Care | Payer: Self-pay | Source: Ambulatory Visit | Attending: Emergency Medicine | Admitting: Emergency Medicine

## 2017-08-20 DIAGNOSIS — R0600 Dyspnea, unspecified: Secondary | ICD-10-CM | POA: Insufficient documentation

## 2017-09-14 IMAGING — CR DG CHEST 2V
2 series · 2 of 2 positions shown · non-contrast
Comparison: None

CLINICAL DATA: Chest pain

EXAM:
CHEST  2 VIEW

[w chest pa]
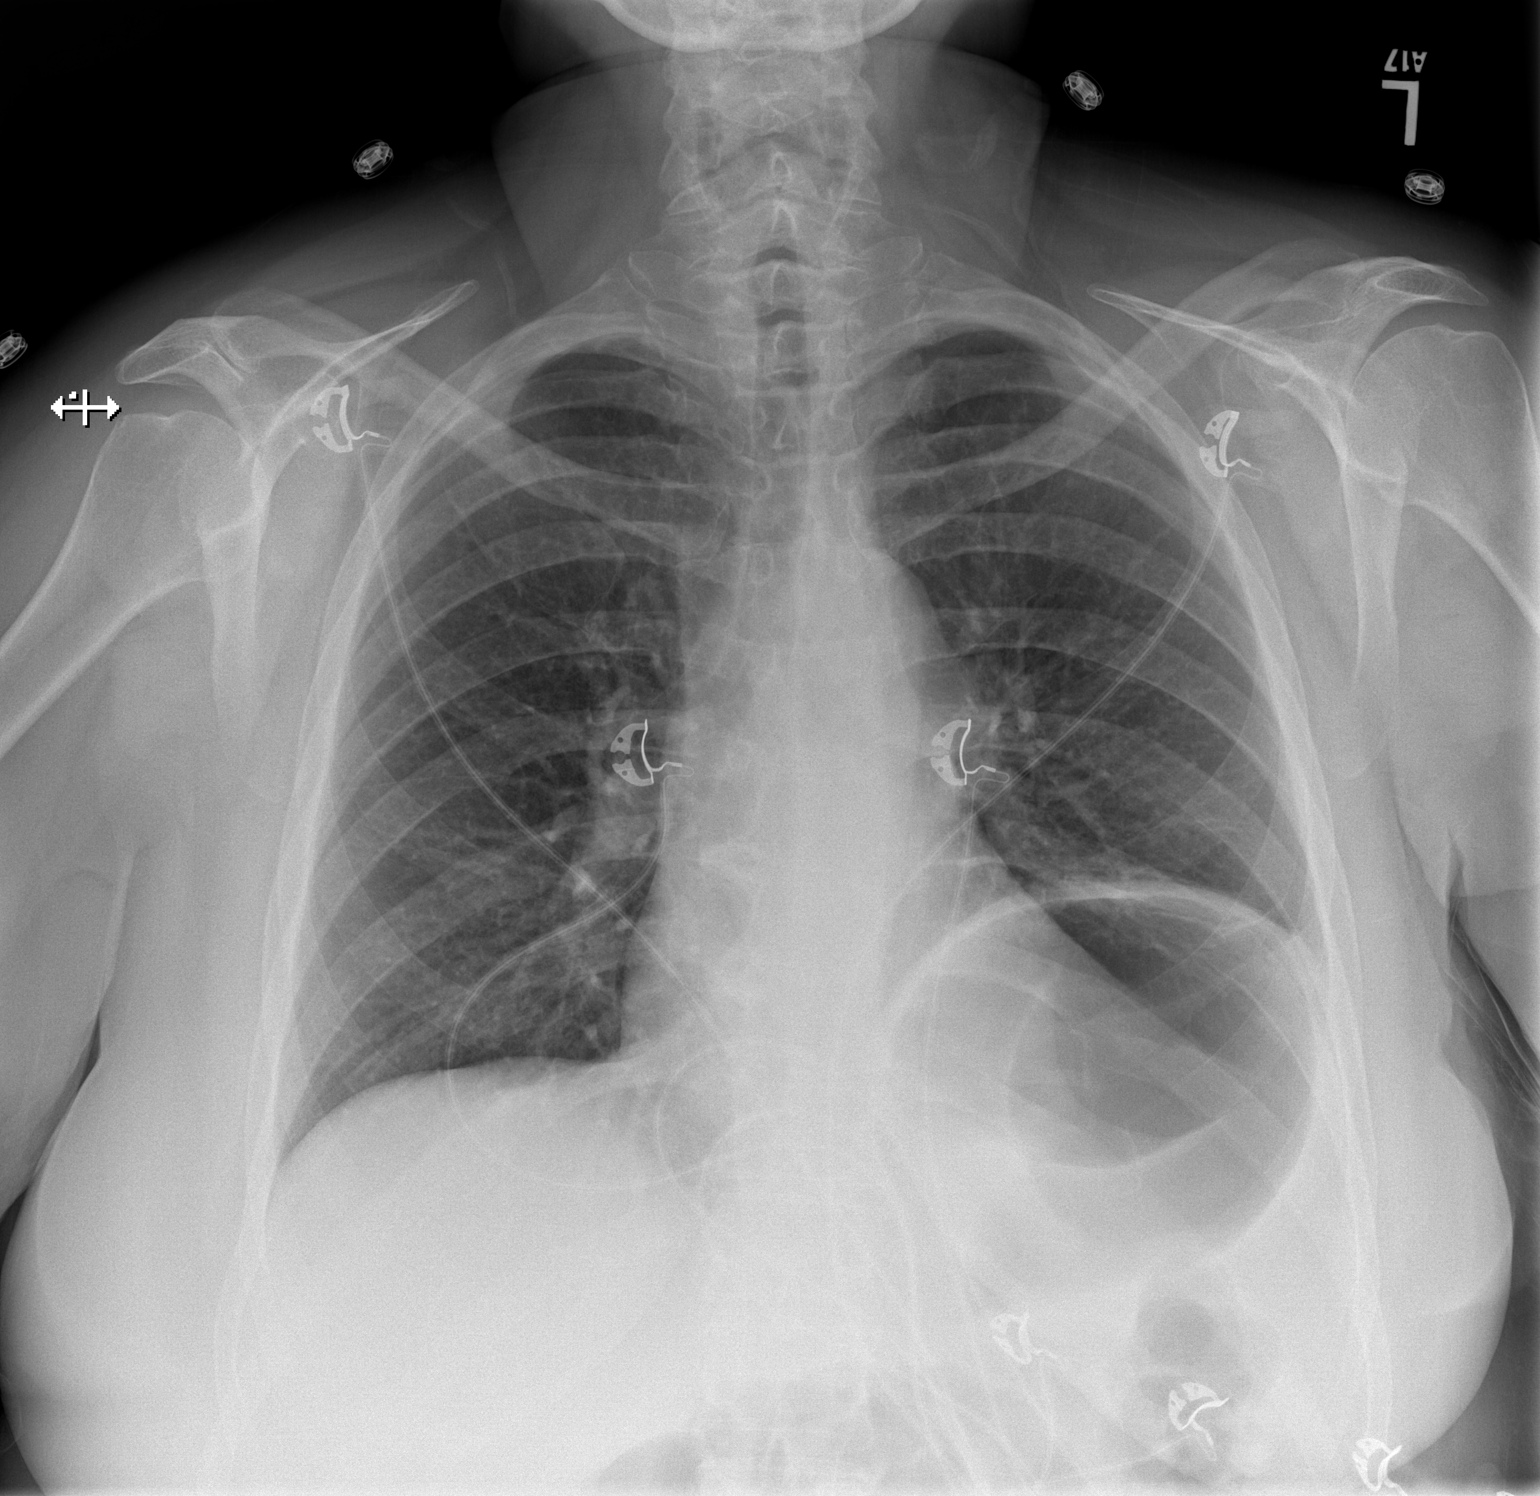

[w chest lat]
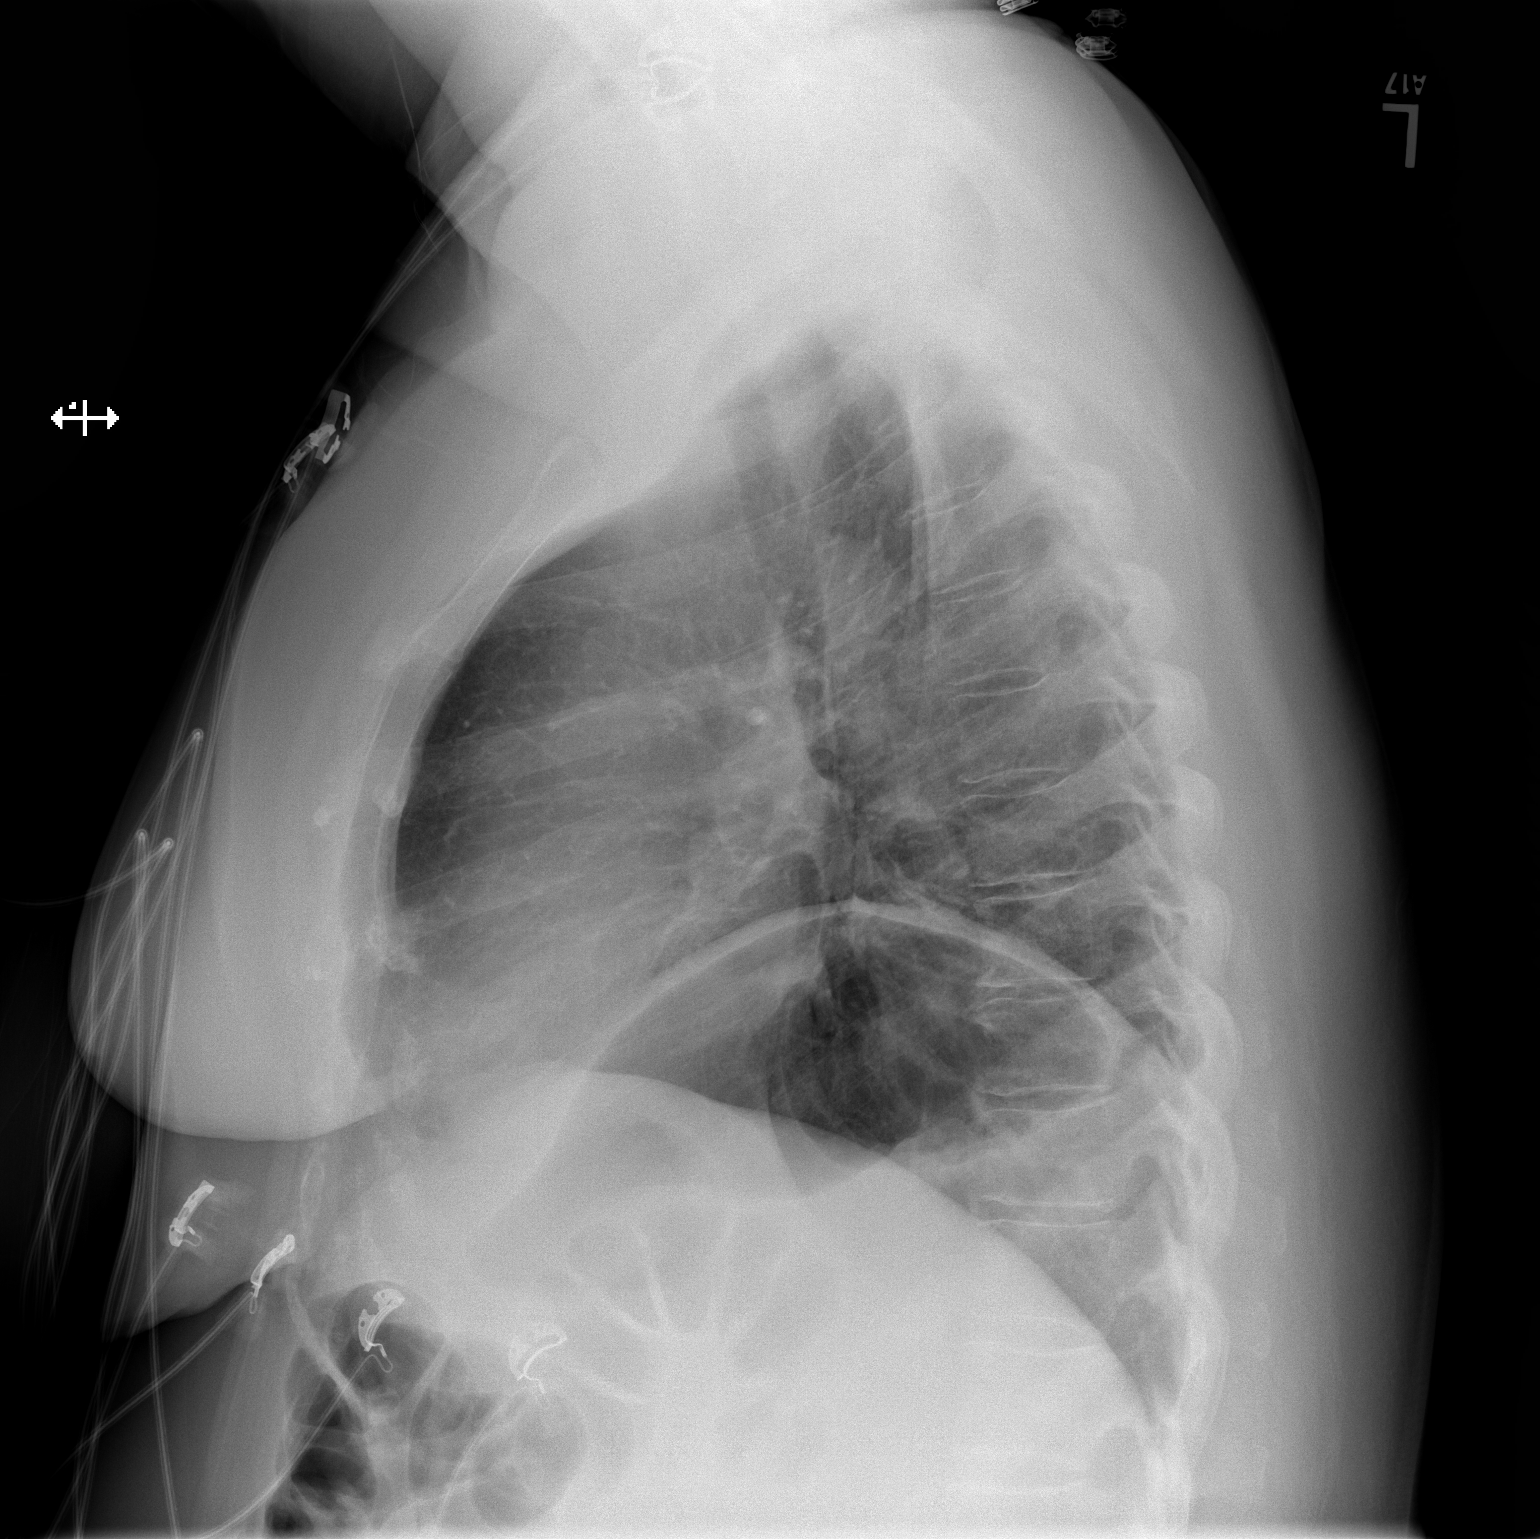

[2 of 2 positions shown; findings below may reference images not displayed]

FINDINGS: The heart size and mediastinal contours are within normal limits.
Chronic asymmetric elevation of the left hemidiaphragm. Both lungs
are clear. The visualized skeletal structures are unremarkable.
IMPRESSION: 1. No acute cardiopulmonary abnormalities

## 2017-09-17 ENCOUNTER — Encounter: Payer: Self-pay | Admitting: Emergency Medicine

## 2017-09-17 ENCOUNTER — Ambulatory Visit (INDEPENDENT_AMBULATORY_CARE_PROVIDER_SITE_OTHER): Payer: Self-pay | Admitting: Emergency Medicine

## 2017-09-17 DIAGNOSIS — R0600 Dyspnea, unspecified: Secondary | ICD-10-CM

## 2017-09-17 DIAGNOSIS — R911 Solitary pulmonary nodule: Secondary | ICD-10-CM

## 2017-09-17 DIAGNOSIS — R059 Cough, unspecified: Secondary | ICD-10-CM

## 2017-09-17 DIAGNOSIS — R05 Cough: Secondary | ICD-10-CM

## 2017-09-17 LAB — PULMONARY FUNCTION TEST
DL/VA % pred: 84 %
DL/VA: 4.49 ml/min/mmHg/L
DLCO cor % pred: 57 %
DLCO cor: 17.75 ml/min/mmHg
DLCO unc % pred: 57 %
DLCO unc: 17.91 ml/min/mmHg
FEF 25-75 Post: 1.7 L/sec
FEF 25-75 Pre: 1.9 L/sec
FEF2575-%Change-Post: -10 %
FEF2575-%Pred-Post: 68 %
FEF2575-%Pred-Pre: 76 %
FEV1-%Change-Post: 0 %
FEV1-%Pred-Post: 65 %
FEV1-%Pred-Pre: 65 %
FEV1-Post: 1.93 L
FEV1-Pre: 1.94 L
FEV1FVC-%Change-Post: -2 %
FEV1FVC-%Pred-Pre: 106 %
FEV6-%Change-Post: 2 %
FEV6-%Pred-Post: 65 %
FEV6-%Pred-Pre: 63 %
FEV6-Post: 2.43 L
FEV6-Pre: 2.36 L
FEV6FVC-%Pred-Post: 104 %
FEV6FVC-%Pred-Pre: 104 %
FVC-%Change-Post: 2 %
FVC-%Pred-Post: 63 %
FVC-%Pred-Pre: 61 %
FVC-Post: 2.43 L
FVC-Pre: 2.38 L
Post FEV1/FVC ratio: 80 %
Post FEV6/FVC ratio: 100 %
Pre FEV1/FVC ratio: 82 %
Pre FEV6/FVC Ratio: 100 %
RV % pred: 94 %
RV: 2.19 L
TLC % pred: 79 %
TLC: 4.61 L

## 2017-09-17 MED ORDER — BUDESONIDE-FORMOTEROL FUMARATE 80-4.5 MCG/ACT IN AERO: 2.0000 | INHALATION_SPRAY | Freq: Every day | RESPIRATORY_TRACT | 12 refills | Status: DC

## 2017-09-17 NOTE — Progress Notes (Signed)
Subjective:    Patient ID: Samantha [name], female    DOB: 12-14-1955, 61 y.o.   MRN: [id number]  Shortness of Breath  Associated symptoms include chest pain, ear pain and headaches. Pertinent negatives include no fever, leg swelling, rash, rhinorrhea, sore throat, vomiting or wheezing.   61 year old never smoker with a history of hypertension, diabetes, hyperlipidemia, allergic rhinitis. She is referred today for evaluation of dyspnea, wheeze, mid-sternal stabbing CP to her back, wheeze. This dates back several years. She has been seen by cardiology, had a clean cath in 2009. ECG's have been normal. Has used albuterol with some relief but not full resolution. She may have had a URI in July, associated w cough and SOB. Treated w prednisone, then returned and received abx, several courses. CT chest 8/9 reviewed by me > minimal LLL atx, 44m RLL nodule. Experienced a syncopal episode 07/05/17, Ultimately admitted 07/12/17 with same CP and SOB. Repeat CT scan 07/13/17 reviewed by me > no PE, elevated L HD, L basilar atx.   She has dry cough for several years. She has allergies, seem to be improved on current meds. She has never been treated for GERD.                                                                                                                                                                                                                  She is currently on Astelin nasal spray, Zyrtec, fluticasone nasal spray, lisinopril 20 mg daily.   ROV 09/17/17 -- This follow-up visit for dyspnea and wheezing. Also with chest discomfort, cough, and a reassuring cardiac evaluation, suspect GERD. Evaluation has shown an elevated left hemidiaphragm. Sniff test was done on 08/20/17 and confirmed that the diaphragm does move normally. I treated her empirically with pantoprazole, changed her lisinopril to valsartan. She believes that her cough is largely unchanged. She still has the mid-epigastric pain  through to her back.   Pulmonary function testing performed on 10/22 was reviewed by me. This shows evidence for restriction both on lung volumes. There was some possible superimposed obstruction. Diffusion capacity was decreased, corrects to the normal range when adjusted for alveolar volume.   Review of Systems  Constitutional: Negative for fever and unexpected weight change.  HENT: Positive for congestion, dental problem and ear pain. Negative for nosebleeds, postnasal drip, rhinorrhea, sinus pressure, sneezing, sore throat and trouble swallowing.   Eyes: Negative for redness and itching.  Respiratory: Positive for cough and shortness of breath. Negative for chest tightness  and wheezing.   Cardiovascular: Positive for chest pain and palpitations. Negative for leg swelling.  Gastrointestinal: Negative for nausea and vomiting.  Genitourinary: Negative for dysuria.  Musculoskeletal: Negative for joint swelling.  Skin: Negative for rash.  Neurological: Positive for headaches.  Hematological: Does not bruise/bleed easily.  Psychiatric/Behavioral: Negative for dysphoric mood. The patient is not nervous/anxious.     Past Medical History:  Diagnosis Date  . Arthritis   . Dyslipidemia   . Heart murmur 2011  . Left bundle branch block 2011  . Type II or unspecified type diabetes mellitus without mention of complication, not stated as uncontrolled   . Unspecified essential hypertension      Family History  Problem Relation Age of Onset  . Diabetes Mother   . Hypertension Mother   . CAD Mother   . Hypertension Father   . Diabetes Father   . CAD Father   . Diabetes Brother   . Hyperlipidemia Brother   . Hypertension Brother   . CAD Brother   . Diabetes Sister   . Hypertension Sister      Social History   Social History  . Marital status: Married    Spouse name: N/A  . Number of children: N/A  . Years of education: N/A   Occupational History  . Not on file.   Social  History Main Topics  . Smoking status: Never Smoker  . Smokeless tobacco: Never Used  . Alcohol use No  . Drug use: No  . Sexual activity: Not on file   Other Topics Concern  . Not on file   Social History Narrative  . No narrative on file     Allergies  Allergen Reactions  . Iohexol Anaphylaxis     Code: SOB, Desc: respiratory distress-ASM, Onset Date: [id number]      Outpatient Medications Prior to Visit  Medication Sig Dispense Refill  . albuterol (PROVENTIL HFA;VENTOLIN HFA) 108 (90 Base) MCG/ACT inhaler Inhale 2 puffs into the lungs every 6 (six) hours as needed for wheezing or shortness of breath. 1 Inhaler 0  . aspirin 81 MG tablet Take 81 mg by mouth every morning.     Marland Kitchen azelastine (ASTELIN) 0.1 % nasal spray Place into both nostrils daily as needed. Use in each nostril as directed     . benzonatate (TESSALON) 100 MG capsule Take by mouth 3 (three) times daily as needed for cough.    . Calcium Citrate-Vitamin D (CALCIUM + D PO) Take 1 tablet by mouth every morning.     . cetirizine (ZYRTEC) 10 MG tablet Take 10 mg by mouth daily.    Marland Kitchen FLUoxetine (PROZAC) 20 MG capsule Take 20 mg by mouth every morning.     Marland Kitchen glimepiride (AMARYL) 2 MG tablet Take 2 mg by mouth 2 (two) times daily.     Marland Kitchen ipratropium-albuterol (DUONEB) 0.5-2.5 (3) MG/3ML SOLN Inhale 3 mLs into the lungs daily as needed (wheezing).     Marland Kitchen lovastatin (MEVACOR) 20 MG tablet Take 20 mg by mouth at bedtime.    . meloxicam (MOBIC) 15 MG tablet Take 15 mg by mouth daily as needed for pain.     . metFORMIN (GLUCOPHAGE) 1000 MG tablet Take 1,000 mg by mouth 2 (two) times daily with a meal.    . nitroGLYCERIN (NITROSTAT) 0.4 MG SL tablet Place 1 tablet (0.4 mg total) under the tongue every 5 (five) minutes as needed for chest pain. 30 tablet 12  . Omega-3 Fatty Acids (FISH OIL)  1000 MG CAPS Take 1,000 mg by mouth every morning.     . pantoprazole (PROTONIX) 40 MG tablet Take 1 tablet (40 mg total) by mouth daily. 30  tablet 5  . valsartan (DIOVAN) 160 MG tablet Take 1 tablet (160 mg total) by mouth daily. 30 tablet 5  . verapamil (VERELAN PM) 240 MG 24 hr capsule Take 240 mg by mouth every morning.      No facility-administered medications prior to visit.         Objective:   Physical Exam Vitals:   09/17/17 1453 09/17/17 1455  BP:  (!) 150/80  Pulse:  84  SpO2:  96%  Weight: 244 lb (110.7 kg)   Height: '5\' 9"'$  (1.753 m)    Gen: Pleasant, overweight, in no distress,  normal affect  ENT: No lesions,  mouth clear,  oropharynx clear, no postnasal drip  Neck: No JVD, no stridor  Lungs: No use of accessory muscles, clear without rales or rhonchi, no crackles or wheezes  Cardiovascular: RRR, heart sounds normal, no murmur or gallops, no peripheral edema  Musculoskeletal: No deformities, no cyanosis or clubbing  Neuro: alert, non focal  Skin: Warm, no lesions or rashes    CT  Chest 07/13/17 --  COMPARISON:  Chest CT July 04, 2017; chest radiograph July 12, 2017  FINDINGS: Cardiovascular: There is no demonstrable pulmonary embolus. There is no appreciable thoracic aortic aneurysm or dissection. Visualized great vessels appear unremarkable. There is atherosclerotic calcification in the aortic arch region. There are mild foci of coronary artery calcification. Pericardium does appreciably thickened.  Mediastinum/Nodes: No thyroid lesions are evident. There are occasional subcentimeter lymph nodes. There is no adenopathy in the thoracic region by size criteria. No esophageal lesions are appreciable.  Lungs/Pleura: There is elevation of the left hemidiaphragm with atelectatic change in the left base. A 4 mm nodular opacity in the posterior segment right lower lobe is seen on axial slice 72 series 7, better seen on recent CT. Differences in appearance in this area likely due to mild motion in the lung bases on the current examination. No new parenchymal nodular lesion evident.  No consolidation or edema evident. No appreciable pleural effusion or pleural thickening.  Upper Abdomen: There is incomplete visualization of an apparent adenoma arising from the left adrenal measuring 9 x 8 mm. Visualized upper abdominal structures otherwise appear unremarkable.  Musculoskeletal: There is mild degenerative change in the thoracic spine. There are no blastic or lytic bone lesions.  Review of the MIP images confirms the above findings.  IMPRESSION: 1.  No demonstrable pulmonary embolus.  2. Foci of atherosclerotic calcification in the aorta. No aneurysm or dissection in the aorta. Mild coronary artery calcification noted.  3. Stable mild elevation of the left hemidiaphragm with left base atelectasis. Stable 4 mm nodular opacity posterior right base, better seen on recent prior study due to mild motion at this time. No follow-up needed if patient is low-risk. Non-contrast chest CT can be considered in 12 months if patient is high-risk. This recommendation follows the consensus statement: Guidelines for Management of Incidental Pulmonary Nodules Detected on CT Images: From the Fleischner Society 2017; Radiology 2017; 284:228-243.  4.  No evident adenopathy.  5. Incomplete visualization of apparent 9 x 8 mm left adrenal adenoma.      Assessment & Plan:  Pulmonary nodule, right No current follow-up indicated  Cough Suspect impact of GERD. Her ACE inhibitor was changed to valsartan. Her cough has not changed significantly on pantoprazole  plus allergy regimen. She still has pain in her mid epigastric region through to her back. I will continue the pantoprazole. If the discomfort continues and in light of a reassuring cardiac evaluation I will refer her to GI  Dyspnea Suspect multifactorial with some left basilar atelectasis and elevated left diaphragm. Sniff test shows good hemidiaphragm excursion. Also some evidence for obstructive disease. I believe it  would be appropriate to do an empiric trial of Symbicort to see if she benefits.  Baltazar Apo, MD, PhD 09/17/2017, 3:23 PM [facility] [phone] or if no answer [phone]

## 2017-09-17 NOTE — Assessment & Plan Note (Signed)
No current follow-up indicated

## 2017-09-17 NOTE — Progress Notes (Signed)
PFT completed today 09/17/17.

## 2017-09-17 NOTE — Patient Instructions (Addendum)
Please continue pantoprazole 40 mg daily as you have been taking it Continue valsartan as ordered We will start Symbicort 80 g, 2 puffs twice a day. Remember to rinse and gargle after using this medication Take albuterol 2 puffs up to every 4 hours if needed for shortness of breath or wheezing.  If your chest and back discomfort continues then we may decide to refer you to see gastroenterology. Please continue your same allergy medications as you have been taking them Follow with Dr Lamonte Sakai in 1 month

## 2017-09-17 NOTE — Assessment & Plan Note (Signed)
Suspect multifactorial with some left basilar atelectasis and elevated left diaphragm. Sniff test shows good hemidiaphragm excursion. Also some evidence for obstructive disease. I believe it would be appropriate to do an empiric trial of Symbicort to see if she benefits.

## 2017-09-17 NOTE — Assessment & Plan Note (Signed)
Suspect impact of GERD. Her ACE inhibitor was changed to valsartan. Her cough has not changed significantly on pantoprazole plus allergy regimen. She still has pain in her mid epigastric region through to her back. I will continue the pantoprazole. If the discomfort continues and in light of a reassuring cardiac evaluation I will refer her to GI

## 2017-09-18 ENCOUNTER — Telehealth: Payer: Self-pay | Admitting: Emergency Medicine

## 2017-09-18 NOTE — Telephone Encounter (Signed)
Spoke with patient's husband. He stated that his wife was seen yesterday by RB and needs a note stating she was seen yesterday.   Will ahead and type up note for her. Letter was given to husband. Nothing else needed at time of call.

## 2017-10-24 ENCOUNTER — Ambulatory Visit (INDEPENDENT_AMBULATORY_CARE_PROVIDER_SITE_OTHER): Payer: Self-pay | Admitting: Emergency Medicine

## 2017-10-24 ENCOUNTER — Encounter: Payer: Self-pay | Admitting: Emergency Medicine

## 2017-10-24 DIAGNOSIS — R911 Solitary pulmonary nodule: Secondary | ICD-10-CM

## 2017-10-24 DIAGNOSIS — R059 Cough, unspecified: Secondary | ICD-10-CM

## 2017-10-24 DIAGNOSIS — R0600 Dyspnea, unspecified: Secondary | ICD-10-CM

## 2017-10-24 DIAGNOSIS — K219 Gastro-esophageal reflux disease without esophagitis: Secondary | ICD-10-CM

## 2017-10-24 DIAGNOSIS — R05 Cough: Secondary | ICD-10-CM

## 2017-10-24 NOTE — Assessment & Plan Note (Signed)
Benefited from pantoprazole, will continue current dose.

## 2017-10-24 NOTE — Assessment & Plan Note (Signed)
Given size and low risk profile, no followup indicated.

## 2017-10-24 NOTE — Patient Instructions (Addendum)
Please continue Zyrtec and Astelin nasal spray as you are taking them  Stop Symbicort Keep albuterol available to use 2 puffs if needed for wheeze or shortness of breath.        Continue pantoprazole 40mg  once a day, 1 hour after food. Follow with Dr Lamonte Sakai in 1 year or sooner if you have any problems.

## 2017-10-24 NOTE — Assessment & Plan Note (Signed)
Has benefited from GERD therapy. continue same rhinitis and GERD regimen.

## 2017-10-24 NOTE — Progress Notes (Signed)
Subjective:    Patient ID: Samantha [name], female    DOB: Sep 22, 1956, 61 y.o.   MRN: [id number]  Shortness of Breath  Associated symptoms include chest pain, ear pain and headaches. Pertinent negatives include no fever, leg swelling, rash, rhinorrhea, sore throat, vomiting or wheezing.   61 year old never smoker with a history of hypertension, diabetes, hyperlipidemia, allergic rhinitis. She is referred today for evaluation of dyspnea, wheeze, mid-sternal stabbing CP to her back, wheeze. This dates back several years. She has been seen by cardiology, had a clean cath in 2009. ECG's have been normal. Has used albuterol with some relief but not full resolution. She may have had a URI in July, associated w cough and SOB. Treated w prednisone, then returned and received abx, several courses. CT chest 8/9 reviewed by me > minimal LLL atx, 26m RLL nodule. Experienced a syncopal episode 07/05/17, Ultimately admitted 07/12/17 with same CP and SOB. Repeat CT scan 07/13/17 reviewed by me > no PE, elevated L HD, L basilar atx.   She has dry cough for several years. She has allergies, seem to be improved on current meds. She has never been treated for GERD.                                                                                                                                                                                                                  She is currently on Astelin nasal spray, Zyrtec, fluticasone nasal spray, lisinopril 20 mg daily.   ROV 09/17/17 -- This follow-up visit for dyspnea and wheezing. Also with chest discomfort, cough, and a reassuring cardiac evaluation, suspect GERD. Evaluation has shown an elevated left hemidiaphragm. Sniff test was done on 08/20/17 and confirmed that the diaphragm does move normally. I treated her empirically with pantoprazole, changed her lisinopril to valsartan. She believes that her cough is largely unchanged. She still has the mid-epigastric pain  through to her back.   Pulmonary function testing performed on 10/22 was reviewed by me. This shows evidence for restriction both on lung volumes. There was some possible superimposed obstruction. Diffusion capacity was decreased, corrects to the normal range when adjusted for alveolar volume.   ROV 10/24/17 --this is a follow-up visit for poker with a history of dyspnea and cough in the setting of an elevated left hemidiaphragm and left basilar atelectasis.  Also with a 4 mm right basilar opacity of unclear significance.  Given its size no follow-up has been planned.  We have treated GERD and allergic rhinitis  as potential contributors to her cough.  Spirometry suggests some possible superimposed obstruction so we did a trial of Symbicort to see if she would benefit. She isn't sure that it has helped her any. She still has albuterol - has used it twice since last time.   Review of Systems  Constitutional: Negative for fever and unexpected weight change.  HENT: Positive for congestion, dental problem and ear pain. Negative for nosebleeds, postnasal drip, rhinorrhea, sinus pressure, sneezing, sore throat and trouble swallowing.   Eyes: Negative for redness and itching.  Respiratory: Positive for cough and shortness of breath. Negative for chest tightness and wheezing.   Cardiovascular: Positive for chest pain and palpitations. Negative for leg swelling.  Gastrointestinal: Negative for nausea and vomiting.  Genitourinary: Negative for dysuria.  Musculoskeletal: Negative for joint swelling.  Skin: Negative for rash.  Neurological: Positive for headaches.  Hematological: Does not bruise/bleed easily.  Psychiatric/Behavioral: Negative for dysphoric mood. The patient is not nervous/anxious.     Past Medical History:  Diagnosis Date  . Arthritis   . Dyslipidemia   . Heart murmur 2011  . Left bundle branch block 2011  . Type II or unspecified type diabetes mellitus without mention of complication,  not stated as uncontrolled   . Unspecified essential hypertension      Family History  Problem Relation Age of Onset  . Diabetes Mother   . Hypertension Mother   . CAD Mother   . Hypertension Father   . Diabetes Father   . CAD Father   . Diabetes Brother   . Hyperlipidemia Brother   . Hypertension Brother   . CAD Brother   . Diabetes Sister   . Hypertension Sister      Social History   Socioeconomic History  . Marital status: Married    Spouse name: Not on file  . Number of children: Not on file  . Years of education: Not on file  . Highest education level: Not on file  Social Needs  . Financial resource strain: Not on file  . Food insecurity - worry: Not on file  . Food insecurity - inability: Not on file  . Transportation needs - medical: Not on file  . Transportation needs - non-medical: Not on file  Occupational History  . Not on file  Tobacco Use  . Smoking status: Never Smoker  . Smokeless tobacco: Never Used  Substance and Sexual Activity  . Alcohol use: No  . Drug use: No  . Sexual activity: Not on file  Other Topics Concern  . Not on file  Social History Narrative  . Not on file     Allergies  Allergen Reactions  . Iohexol Anaphylaxis     Code: SOB, Desc: respiratory distress-ASM, Onset Date: 55974163      Outpatient Medications Prior to Visit  Medication Sig Dispense Refill  . albuterol (PROVENTIL HFA;VENTOLIN HFA) 108 (90 Base) MCG/ACT inhaler Inhale 2 puffs into the lungs every 6 (six) hours as needed for wheezing or shortness of breath. 1 Inhaler 0  . aspirin 81 MG tablet Take 81 mg by mouth every morning.     Marland Kitchen azelastine (ASTELIN) 0.1 % nasal spray Place into both nostrils daily as needed. Use in each nostril as directed     . benzonatate (TESSALON) 100 MG capsule Take by mouth 3 (three) times daily as needed for cough.    . budesonide-formoterol (SYMBICORT) 80-4.5 MCG/ACT inhaler Inhale 2 puffs into the lungs daily. 1 Inhaler 12  .  Calcium Citrate-Vitamin D (CALCIUM + D PO) Take 1 tablet by mouth every morning.     . cetirizine (ZYRTEC) 10 MG tablet Take 10 mg by mouth daily.    Marland Kitchen FLUoxetine (PROZAC) 20 MG capsule Take 20 mg by mouth every morning.     Marland Kitchen glimepiride (AMARYL) 2 MG tablet Take 2 mg by mouth 2 (two) times daily.     Marland Kitchen ipratropium-albuterol (DUONEB) 0.5-2.5 (3) MG/3ML SOLN Inhale 3 mLs into the lungs daily as needed (wheezing).     Marland Kitchen lovastatin (MEVACOR) 20 MG tablet Take 20 mg by mouth at bedtime.    . meloxicam (MOBIC) 15 MG tablet Take 15 mg by mouth daily as needed for pain.     . metFORMIN (GLUCOPHAGE) 1000 MG tablet Take 1,000 mg by mouth 2 (two) times daily with a meal.    . nitroGLYCERIN (NITROSTAT) 0.4 MG SL tablet Place 1 tablet (0.4 mg total) under the tongue every 5 (five) minutes as needed for chest pain. 30 tablet 12  . Omega-3 Fatty Acids (FISH OIL) 1000 MG CAPS Take 1,000 mg by mouth every morning.     . pantoprazole (PROTONIX) 40 MG tablet Take 1 tablet (40 mg total) by mouth daily. 30 tablet 5  . valsartan (DIOVAN) 160 MG tablet Take 1 tablet (160 mg total) by mouth daily. 30 tablet 5  . verapamil (VERELAN PM) 240 MG 24 hr capsule Take 240 mg by mouth every morning.      No facility-administered medications prior to visit.         Objective:   Physical Exam Vitals:   10/24/17 1520  BP: (!) 148/80  Pulse: 75  SpO2: 95%  Weight: 242 lb (109.8 kg)  Height: '5\' 9"'$  (1.753 m)   Gen: Pleasant, overweight, in no distress,  normal affect  ENT: No lesions,  mouth clear,  oropharynx clear, no postnasal drip  Neck: No JVD, no stridor  Lungs: No use of accessory muscles, clear without rales or rhonchi, no crackles or wheezes  Cardiovascular: RRR, heart sounds normal, no murmur or gallops, no peripheral edema  Musculoskeletal: No deformities, no cyanosis or clubbing  Neuro: alert, non focal  Skin: Warm, no lesions or rashes    CT  Chest 07/13/17 --  COMPARISON:  Chest CT July 04, 2017; chest radiograph July 12, 2017  FINDINGS: Cardiovascular: There is no demonstrable pulmonary embolus. There is no appreciable thoracic aortic aneurysm or dissection. Visualized great vessels appear unremarkable. There is atherosclerotic calcification in the aortic arch region. There are mild foci of coronary artery calcification. Pericardium does appreciably thickened.  Mediastinum/Nodes: No thyroid lesions are evident. There are occasional subcentimeter lymph nodes. There is no adenopathy in the thoracic region by size criteria. No esophageal lesions are appreciable.  Lungs/Pleura: There is elevation of the left hemidiaphragm with atelectatic change in the left base. A 4 mm nodular opacity in the posterior segment right lower lobe is seen on axial slice 72 series 7, better seen on recent CT. Differences in appearance in this area likely due to mild motion in the lung bases on the current examination. No new parenchymal nodular lesion evident. No consolidation or edema evident. No appreciable pleural effusion or pleural thickening.  Upper Abdomen: There is incomplete visualization of an apparent adenoma arising from the left adrenal measuring 9 x 8 mm. Visualized upper abdominal structures otherwise appear unremarkable.  Musculoskeletal: There is mild degenerative change in the thoracic spine. There are no blastic or lytic bone lesions.  Review of the MIP images confirms the above findings.  IMPRESSION: 1.  No demonstrable pulmonary embolus.  2. Foci of atherosclerotic calcification in the aorta. No aneurysm or dissection in the aorta. Mild coronary artery calcification noted.  3. Stable mild elevation of the left hemidiaphragm with left base atelectasis. Stable 4 mm nodular opacity posterior right base, better seen on recent prior study due to mild motion at this time. No follow-up needed if patient is low-risk. Non-contrast chest CT can be considered in  12 months if patient is high-risk. This recommendation follows the consensus statement: Guidelines for Management of Incidental Pulmonary Nodules Detected on CT Images: From the Fleischner Society 2017; Radiology 2017; 284:228-243.  4.  No evident adenopathy.  5. Incomplete visualization of apparent 9 x 8 mm left adrenal adenoma.      Assessment & Plan:  Pulmonary nodule, right Given size and low risk profile, no followup indicated.   Cough Has benefited from GERD therapy. continue same rhinitis and GERD regimen.   Dyspnea Suspect primarily restriction. Some evidence possible obstruction, no response to symbicort. Will stop scheduled regimen and continue albuterol prn.   GERD (gastroesophageal reflux disease) Benefited from pantoprazole, will continue current dose.   Baltazar Apo, MD, PhD 10/24/2017, 3:46 PM [facility] [phone] or if no answer [phone]

## 2017-10-24 NOTE — Assessment & Plan Note (Signed)
Suspect primarily restriction. Some evidence possible obstruction, no response to symbicort. Will stop scheduled regimen and continue albuterol prn.

## 2018-02-21 DIAGNOSIS — J329 Chronic sinusitis, unspecified: Secondary | ICD-10-CM | POA: Insufficient documentation

## 2018-02-21 DIAGNOSIS — H73892 Other specified disorders of tympanic membrane, left ear: Secondary | ICD-10-CM | POA: Insufficient documentation

## 2018-02-21 DIAGNOSIS — H7201 Central perforation of tympanic membrane, right ear: Secondary | ICD-10-CM | POA: Insufficient documentation

## 2018-02-21 DIAGNOSIS — H6983 Other specified disorders of Eustachian tube, bilateral: Secondary | ICD-10-CM | POA: Insufficient documentation

## 2018-03-16 ENCOUNTER — Other Ambulatory Visit: Payer: Self-pay | Admitting: Emergency Medicine

## 2018-06-12 ENCOUNTER — Other Ambulatory Visit: Payer: Self-pay | Admitting: Emergency Medicine

## 2018-07-05 ENCOUNTER — Other Ambulatory Visit: Payer: Self-pay | Admitting: Family Medicine

## 2018-07-05 DIAGNOSIS — M5416 Radiculopathy, lumbar region: Secondary | ICD-10-CM | POA: Insufficient documentation

## 2018-07-05 DIAGNOSIS — R911 Solitary pulmonary nodule: Secondary | ICD-10-CM

## 2018-07-19 DIAGNOSIS — M47816 Spondylosis without myelopathy or radiculopathy, lumbar region: Secondary | ICD-10-CM | POA: Insufficient documentation

## 2018-07-19 DIAGNOSIS — M48062 Spinal stenosis, lumbar region with neurogenic claudication: Secondary | ICD-10-CM | POA: Insufficient documentation

## 2018-07-19 DIAGNOSIS — M5136 Other intervertebral disc degeneration, lumbar region: Secondary | ICD-10-CM | POA: Insufficient documentation

## 2018-08-17 IMAGING — DX DG CHEST 2V
2 series · 2 of 2 positions shown · non-contrast
Comparison: 07/05/2017

CLINICAL DATA: Chest pain

EXAM:
CHEST  2 VIEW

[chest lat]
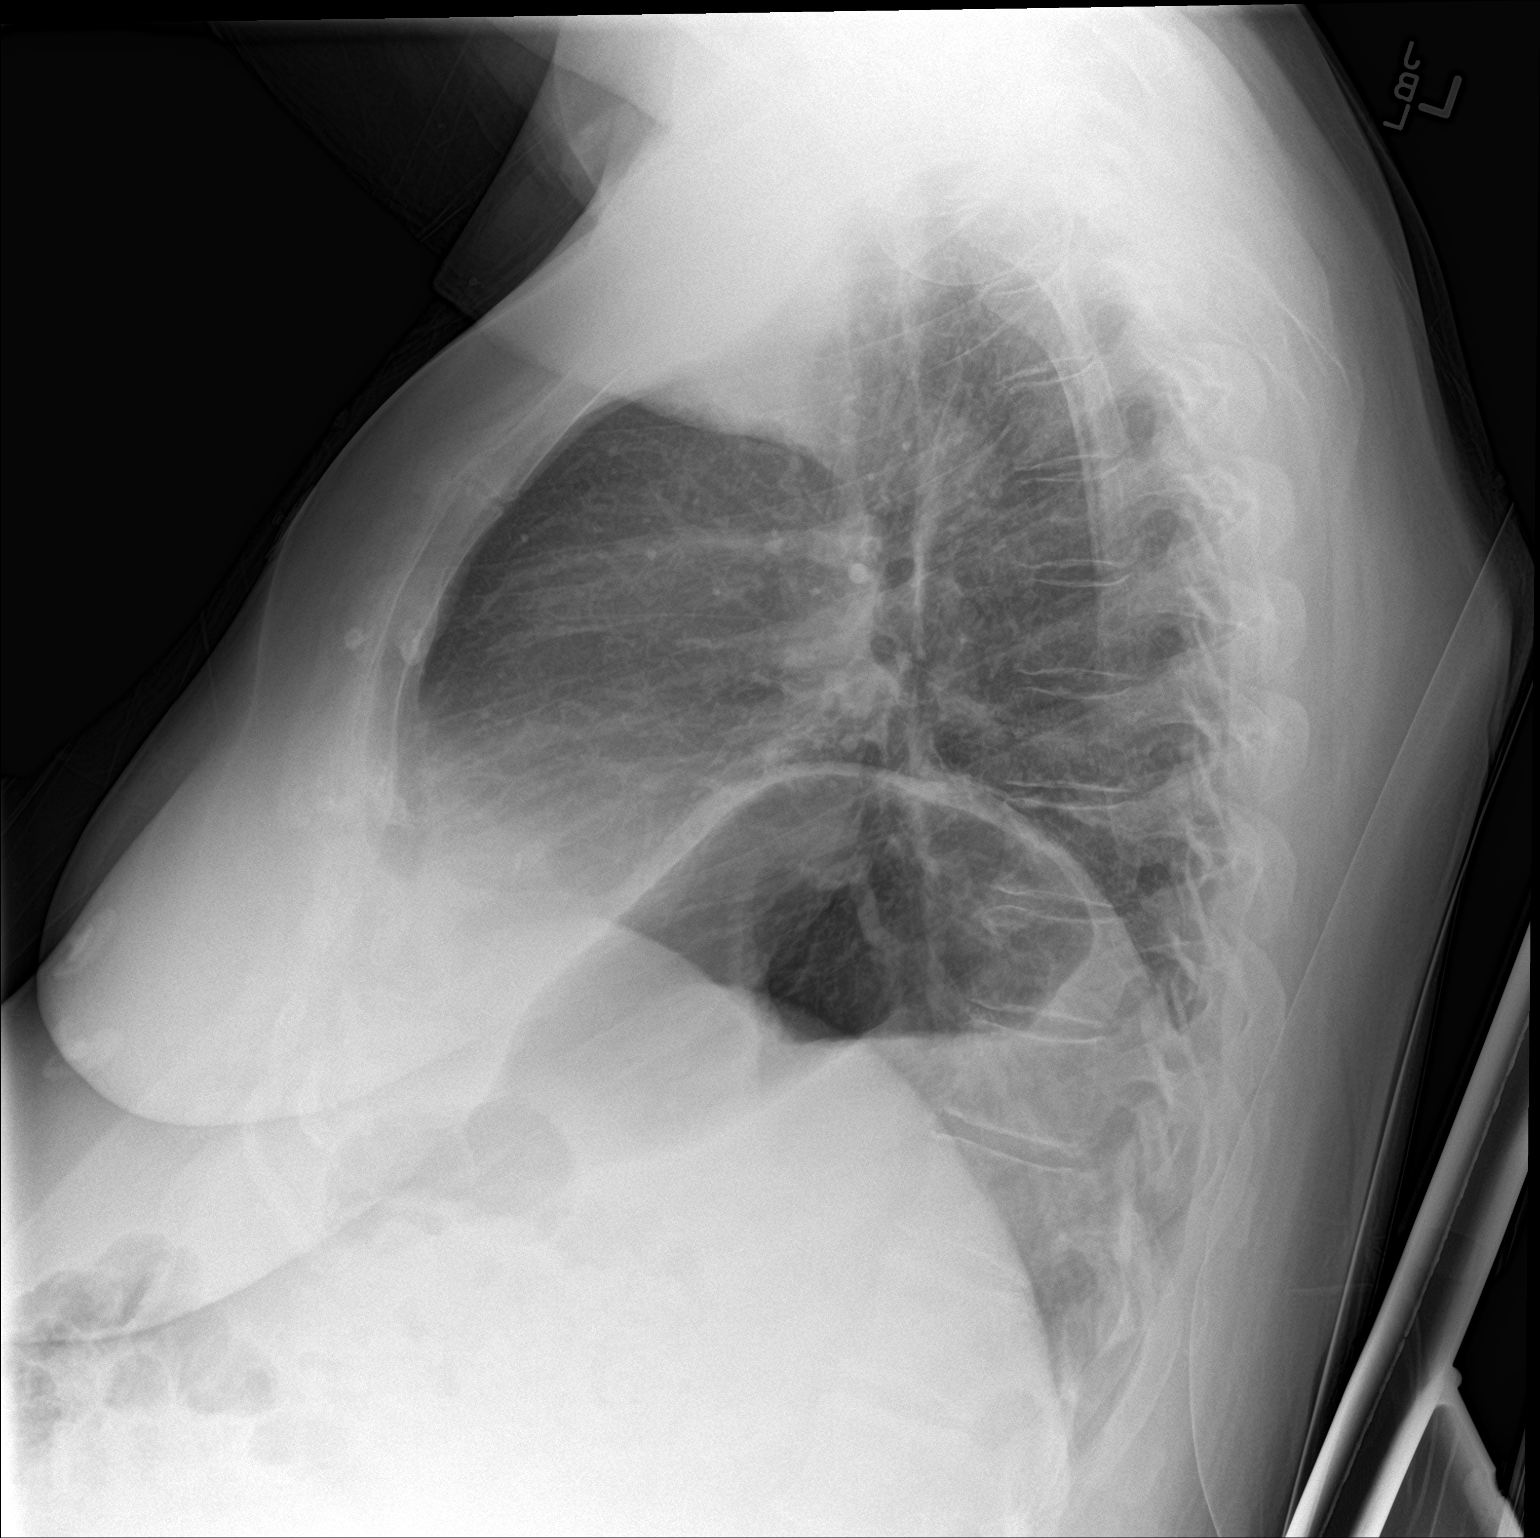

[chest ap]
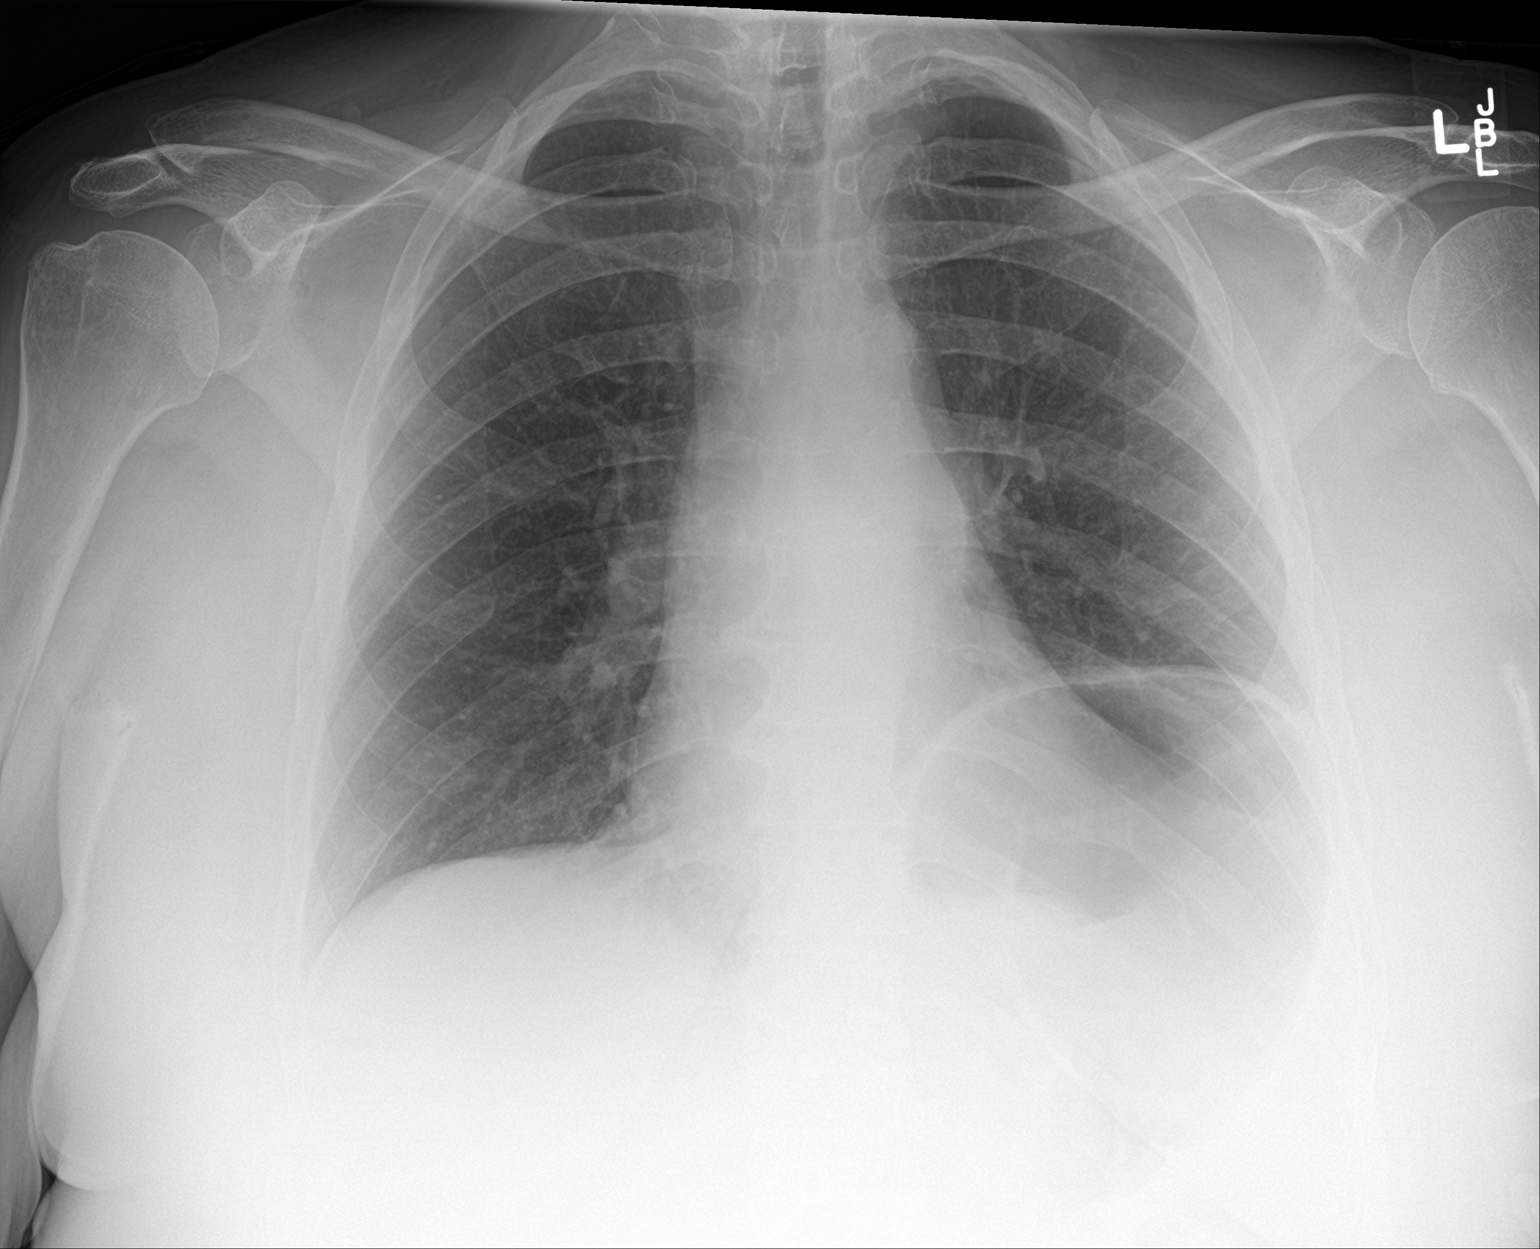

[2 of 2 positions shown; findings below may reference images not displayed]

FINDINGS: There is chronic asymmetric elevation of the left hemidiaphragm.
Normal heart size. No pleural effusion or edema. No airspace
opacities.
IMPRESSION: 1. No acute cardiopulmonary abnormalities.

## 2018-08-18 IMAGING — CT CT ANGIO CHEST
2 of 8 series · 17 of 46 positions shown · IV contrast (APPLIED)
Comparison: Chest CT July 04, 2017; chest radiograph July 12, 2017

CLINICAL DATA: Shortness of breath and chest pain

EXAM:
CT ANGIOGRAPHY CHEST WITH CONTRAST
TECHNIQUE: Multidetector CT imaging of the chest was performed using the
standard protocol during bolus administration of intravenous
contrast. Multiplanar CT image reconstructions and MIPs were
obtained to evaluate the vascular anatomy.
CONTRAST:  100 mL Isovue 370 nonionic

[Series 6: thins · axial · 0.72mm/px · z∈[+1082,+1302]mm · 14 of 244 slices shown]
[im 12/244  lung]
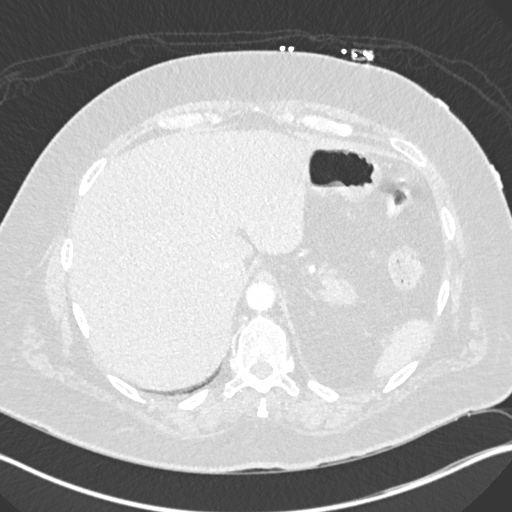
[im 34/244  soft-tissue]
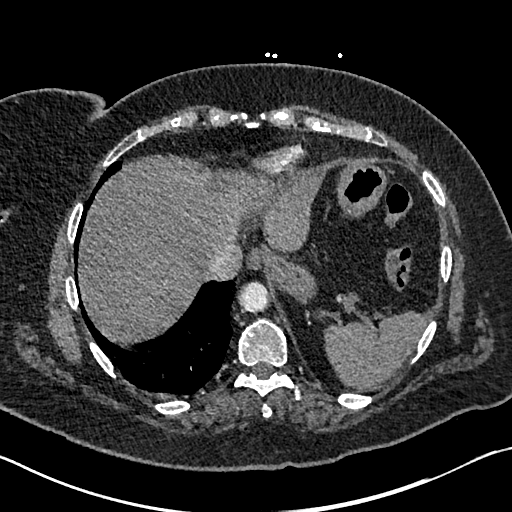
[im 45/244  lung]
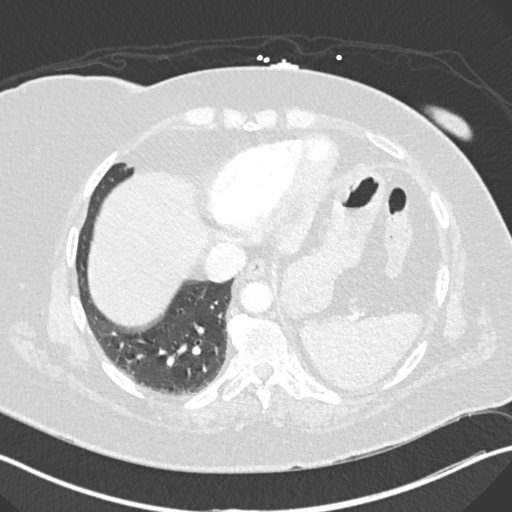
[im 67/244  soft-tissue]
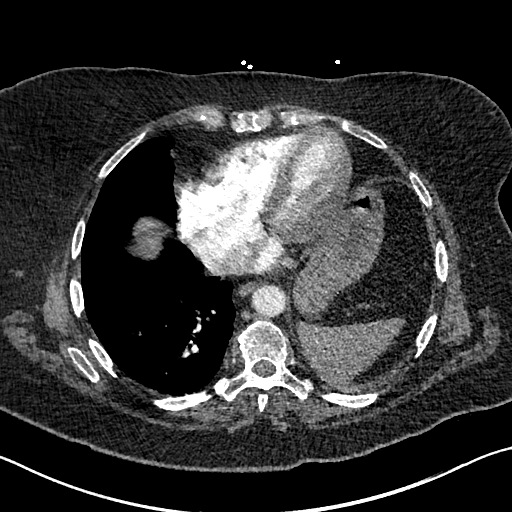
[im 78/244  lung]
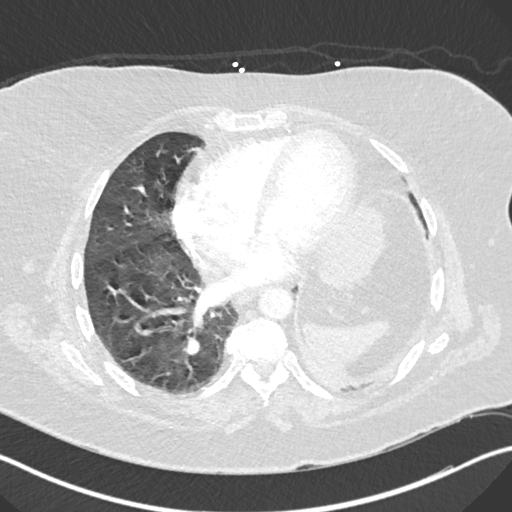
[im 100/244  soft-tissue]
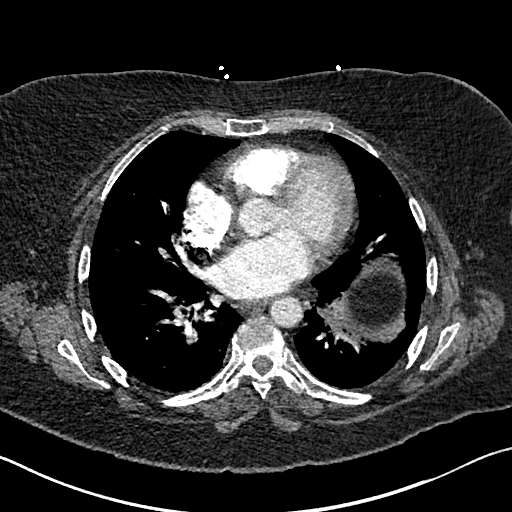
[im 111/244  lung]
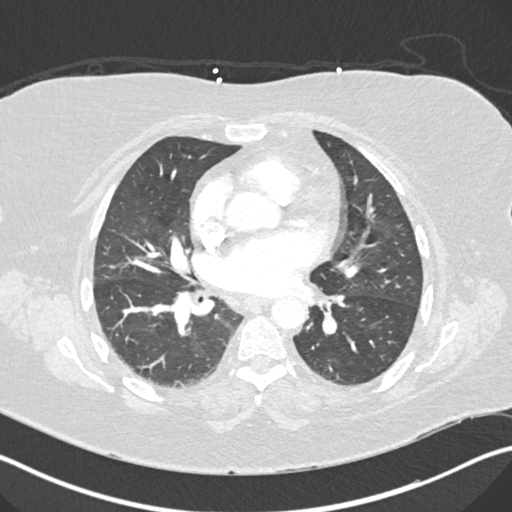
[im 133/244  soft-tissue]
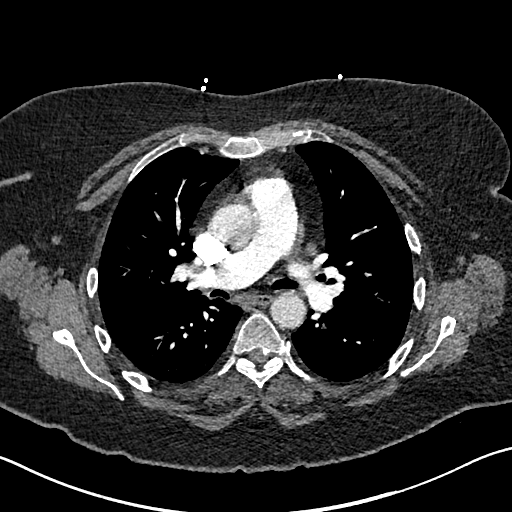
[im 144/244  lung]
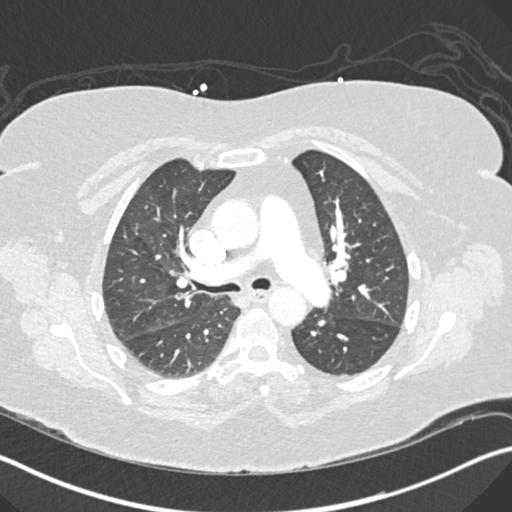
[im 166/244  soft-tissue]
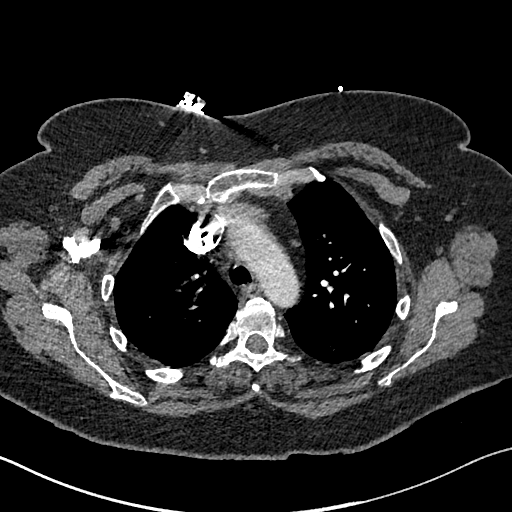
[im 177/244  lung]
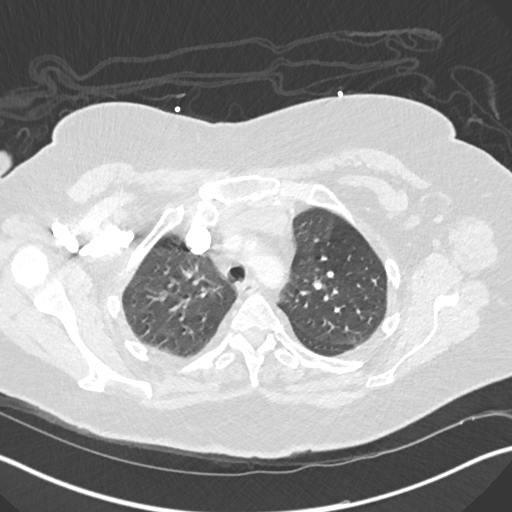
[im 199/244  soft-tissue]
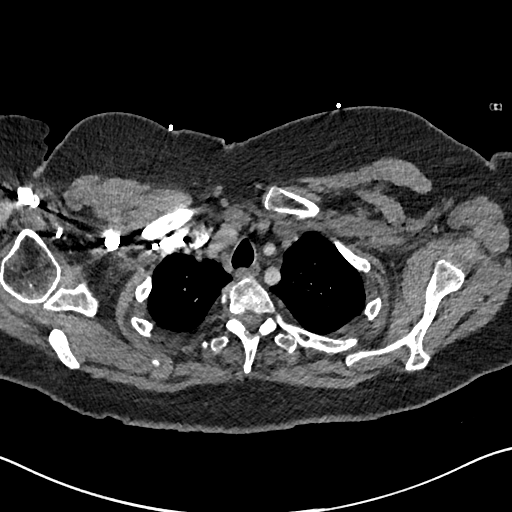
[im 210/244  lung]
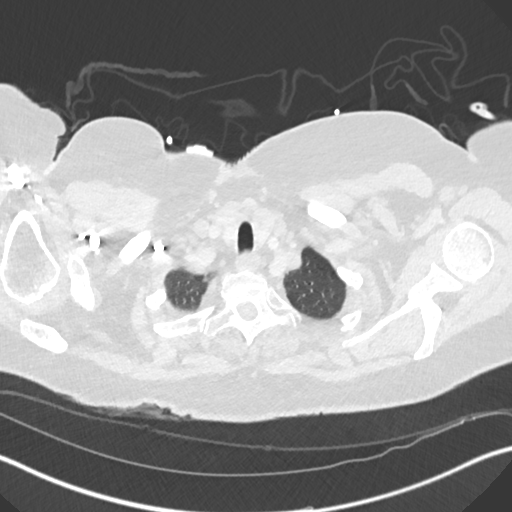
[im 232/244  soft-tissue]
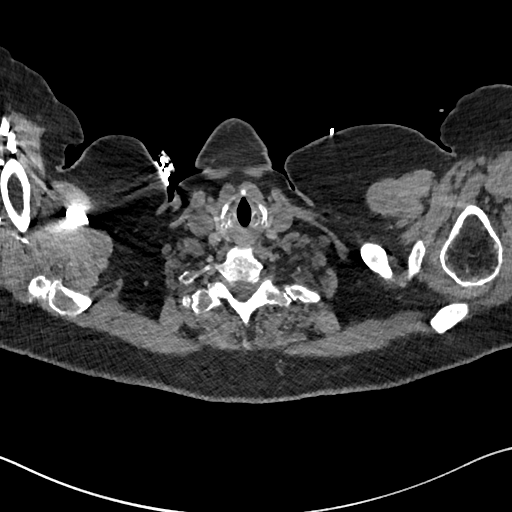

[Series 8: coronal mpr · coronal · 0.53mm/px · 3 of 151 slices shown]
[im 38/151  soft-tissue]
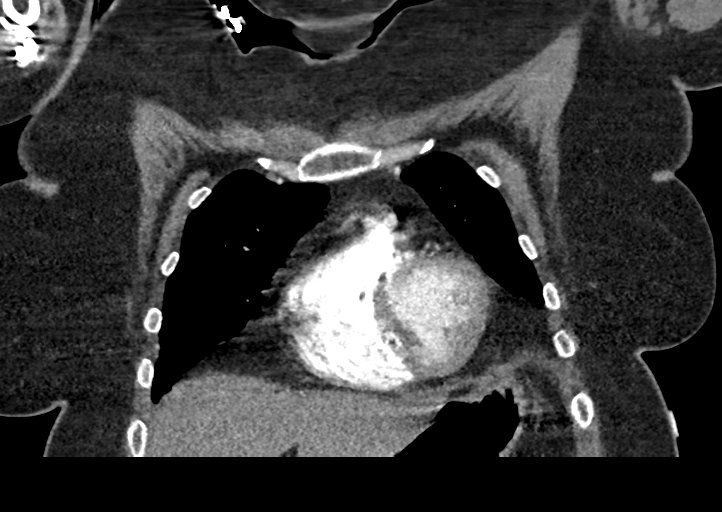
[im 76/151  soft-tissue]
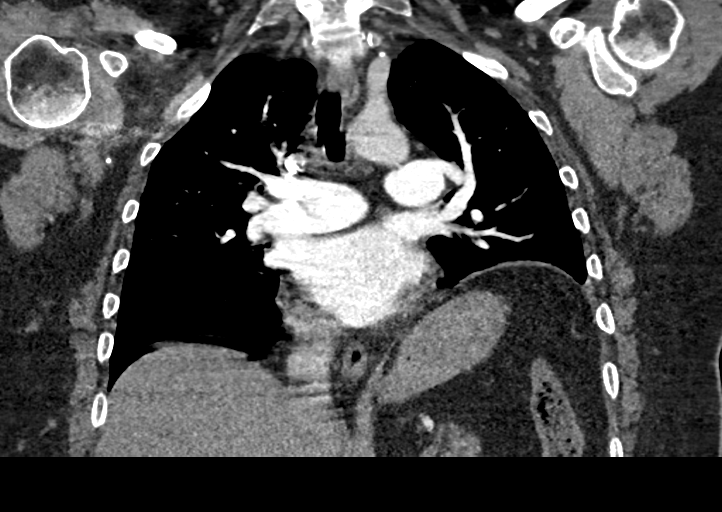
[im 113/151  soft-tissue]
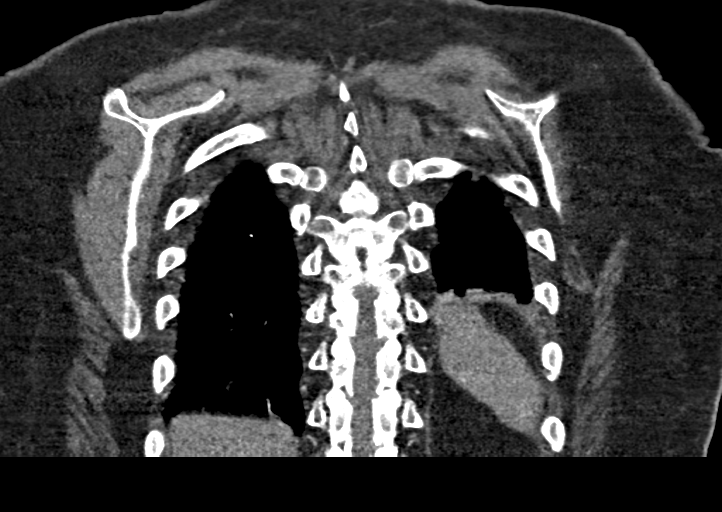

[17 of 46 positions shown; findings below may reference images not displayed]

FINDINGS: Cardiovascular: There is no demonstrable pulmonary embolus. There is
no appreciable thoracic aortic aneurysm or dissection. Visualized
great vessels appear unremarkable. There is atherosclerotic
calcification in the aortic arch region. There are mild foci of
coronary artery calcification. Pericardium does appreciably
thickened.

Mediastinum/Nodes: No thyroid lesions are evident. There are
occasional subcentimeter lymph nodes. There is no adenopathy in the
thoracic region by size criteria. No esophageal lesions are
appreciable.

Lungs/Pleura: There is elevation of the left hemidiaphragm with
atelectatic change in the left base. A 4 mm nodular opacity in the
posterior segment right lower lobe is seen on axial slice 72 series
7, better seen on recent CT. Differences in appearance in this area
likely due to mild motion in the lung bases on the current
examination. No new parenchymal nodular lesion evident. No
consolidation or edema evident. No appreciable pleural effusion or
pleural thickening.

Upper Abdomen: There is incomplete visualization of an apparent
adenoma arising from the left adrenal measuring 9 x 8 mm. Visualized
upper abdominal structures otherwise appear unremarkable.

Musculoskeletal: There is mild degenerative change in the thoracic
spine. There are no blastic or lytic bone lesions.

Review of the MIP images confirms the above findings.
IMPRESSION: 1.  No demonstrable pulmonary embolus.

2. Foci of atherosclerotic calcification in the aorta. No aneurysm
or dissection in the aorta. Mild coronary artery calcification
noted.

3. Stable mild elevation of the left hemidiaphragm with left base
atelectasis. Stable 4 mm nodular opacity posterior right base,
better seen on recent prior study due to mild motion at this time.
No follow-up needed if patient is low-risk. Non-contrast chest CT
can be considered in 12 months if patient is high-risk. This
recommendation follows the consensus statement: Guidelines for
Management of Incidental Pulmonary Nodules Detected on CT Images:

4.  No evident adenopathy.

5. Incomplete visualization of apparent 9 x 8 mm left adrenal
adenoma.

Aortic Atherosclerosis ([id number]-DJV.V).

## 2018-08-23 ENCOUNTER — Ambulatory Visit (HOSPITAL_COMMUNITY)
Admission: EM | Admit: 2018-08-23 | Discharge: 2018-08-23 | Disposition: A | Discharge status: Home / Self Care | Payer: BLUE CROSS/BLUE SHIELD | Attending: Family Medicine | Admitting: Family Medicine

## 2018-08-23 ENCOUNTER — Other Ambulatory Visit: Payer: Self-pay

## 2018-08-23 DIAGNOSIS — J069 Acute upper respiratory infection, unspecified: Secondary | ICD-10-CM | POA: Diagnosis not present

## 2018-08-23 DIAGNOSIS — B9789 Other viral agents as the cause of diseases classified elsewhere: Secondary | ICD-10-CM

## 2018-08-23 MED ORDER — IPRATROPIUM-ALBUTEROL 0.5-2.5 (3) MG/3ML IN SOLN
3.0000 mL | Freq: Once | RESPIRATORY_TRACT | Status: AC
  Administered 2018-08-23: 3 mL via RESPIRATORY_TRACT

## 2018-08-23 MED ORDER — ALBUTEROL SULFATE HFA 108 (90 BASE) MCG/ACT IN AERS
INHALATION_SPRAY | RESPIRATORY_TRACT | Status: AC
  Filled 2018-08-23: qty 6.7

## 2018-08-23 MED ORDER — IPRATROPIUM-ALBUTEROL 0.5-2.5 (3) MG/3ML IN SOLN
RESPIRATORY_TRACT | Status: AC
  Filled 2018-08-23: qty 3

## 2018-08-23 MED ORDER — BENZONATATE 100 MG PO CAPS: 100.0000 mg | ORAL_CAPSULE | Freq: Three times a day (TID) | ORAL | 0 refills | Status: DC

## 2018-08-23 MED ORDER — ALBUTEROL SULFATE HFA 108 (90 BASE) MCG/ACT IN AERS
2.0000 | INHALATION_SPRAY | Freq: Once | RESPIRATORY_TRACT | Status: AC
  Administered 2018-08-23: 2 via RESPIRATORY_TRACT

## 2018-08-23 NOTE — Discharge Instructions (Signed)
Breathing treatment given in office.   Declines chest x-ray today Get plenty of rest and push fluids Use albuterol inhaler as needed for shortness of breath or wheezing Continue OTC medications as needed for symptomatic relief Prescribed tessolone perles as needed for cough Follow up with PCP if symptoms persist Return or go to ER if you have any new or worsening symptoms including fever, chills, worsening cough, nausea, vomiting, abdominal pain, shortness of breath, chest pain, etc..Marland KitchenMarland Kitchen

## 2018-08-23 NOTE — ED Triage Notes (Signed)
Pt states she has sinus pressure, drainage and chest congestion and boyd aches this started last night.

## 2018-08-23 NOTE — ED Provider Notes (Signed)
[place]   [id number] 08/23/18 Arrival Time: 9937  CC: cough   SUBJECTIVE:  Samantha [name] is a 62 y.o. female who presents with cough  that began last night.  Admits to positive sick exposure a work.  Describes cough as intermittent and dry.  Has NOT tried OTC medications.  Symptoms are made worse with talking.  Reports previous symptoms in the past and diagnosed with PNA. Complains of chills, fatigue, sinus pain/pressure, sore throat, and hoarseness.  Denies fever, rhinorrhea, SOB, wheezing, chest pain, nausea, changes in bowel or bladder habits.    ROS: As per HPI.  Past Medical History:  Diagnosis Date  . Arthritis   . Dyslipidemia   . Heart murmur 2011  . Left bundle branch block 2011  . Type II or unspecified type diabetes mellitus without mention of complication, not stated as uncontrolled   . Unspecified essential hypertension    Past Surgical History:  Procedure Laterality Date  . TONSILLECTOMY  1962   Allergies  Allergen Reactions  . Iohexol Anaphylaxis     Code: SOB, Desc: respiratory distress-ASM, Onset Date: [id number]    No current facility-administered medications on file prior to encounter.    Current Outpatient Medications on File Prior to Encounter  Medication Sig Dispense Refill  . aspirin 81 MG tablet Take 81 mg by mouth every morning.     Marland Kitchen azelastine (ASTELIN) 0.1 % nasal spray Place into both nostrils daily as needed. Use in each nostril as directed     . budesonide-formoterol (SYMBICORT) 80-4.5 MCG/ACT inhaler Inhale 2 puffs into the lungs daily. 1 Inhaler 12  . Calcium Citrate-Vitamin D (CALCIUM + D PO) Take 1 tablet by mouth every morning.     . cetirizine (ZYRTEC) 10 MG tablet Take 10 mg by mouth daily.    Marland Kitchen FLUoxetine (PROZAC) 20 MG capsule Take 20 mg by mouth every morning.     Marland Kitchen glimepiride (AMARYL) 2 MG tablet Take 2 mg by mouth 2 (two) times daily.     Marland Kitchen ipratropium-albuterol (DUONEB) 0.5-2.5 (3) MG/3ML SOLN Inhale 3 mLs into  the lungs daily as needed (wheezing).     Marland Kitchen lovastatin (MEVACOR) 20 MG tablet Take 20 mg by mouth at bedtime.    . meloxicam (MOBIC) 15 MG tablet Take 15 mg by mouth daily as needed for pain.     . metFORMIN (GLUCOPHAGE) 1000 MG tablet Take 1,000 mg by mouth 2 (two) times daily with a meal.    . nitroGLYCERIN (NITROSTAT) 0.4 MG SL tablet Place 1 tablet (0.4 mg total) under the tongue every 5 (five) minutes as needed for chest pain. 30 tablet 12  . Omega-3 Fatty Acids (FISH OIL) 1000 MG CAPS Take 1,000 mg by mouth every morning.     . pantoprazole (PROTONIX) 40 MG tablet Take 1 tablet (40 mg total) by mouth daily. 30 tablet 5  . valsartan (DIOVAN) 160 MG tablet TAKE 1 TABLET BY MOUTH ONCE DAILY 30 tablet 2  . verapamil (VERELAN PM) 240 MG 24 hr capsule Take 240 mg by mouth every morning.       Social History   Socioeconomic History  . Marital status: Married    Spouse name: Not on file  . Number of children: Not on file  . Years of education: Not on file  . Highest education level: Not on file  Occupational History  . Not on file  Social Needs  . Financial resource strain: Not on file  . Food insecurity:  Worry: Not on file    Inability: Not on file  . Transportation needs:    Medical: Not on file    Non-medical: Not on file  Tobacco Use  . Smoking status: Never Smoker  . Smokeless tobacco: Never Used  Substance and Sexual Activity  . Alcohol use: No  . Drug use: No  . Sexual activity: Not on file  Lifestyle  . Physical activity:    Days per week: Not on file    Minutes per session: Not on file  . Stress: Not on file  Relationships  . Social connections:    Talks on phone: Not on file    Gets together: Not on file    Attends religious service: Not on file    Active member of club or organization: Not on file    Attends meetings of clubs or organizations: Not on file    Relationship status: Not on file  . Intimate partner violence:    Fear of current or ex partner:  Not on file    Emotionally abused: Not on file    Physically abused: Not on file    Forced sexual activity: Not on file  Other Topics Concern  . Not on file  Social History Narrative  . Not on file   Family History  Problem Relation Age of Onset  . Diabetes Mother   . Hypertension Mother   . CAD Mother   . Hypertension Father   . Diabetes Father   . CAD Father   . Diabetes Brother   . Hyperlipidemia Brother   . Hypertension Brother   . CAD Brother   . Diabetes Sister   . Hypertension Sister      OBJECTIVE:  Vitals:   08/23/18 1210 08/23/18 1211  BP: (!) 129/58   Pulse: 85   Resp: 18   Temp: 97.7 F (36.5 C)   TempSrc: Oral   SpO2: 97%   Weight:  230 lb (104.3 kg)     General appearance: AOx3 in no acute distress; appears fatigued; nontoxic HEENT: NCAT; PERRL.  EOM grossly intact.   No obvious rhinorrhea; tonsils nonerythematous, uvula midline Neck: supple without LAD Lungs: clear to auscultation bilaterally; moderate dry cough present; decreased breath sounds over LL lung improved following duo-neb Heart: regular rate and rhythm.  Radial pulses 2+ symmetrical bilaterally Skin: warm and dry Psychological: alert and cooperative; normal mood and affect  ASSESSMENT & PLAN:  1. Viral URI with cough     Meds ordered this encounter  Medications  . ipratropium-albuterol (DUONEB) 0.5-2.5 (3) MG/3ML nebulizer solution 3 mL  . benzonatate (TESSALON) 100 MG capsule    Sig: Take 1 capsule (100 mg total) by mouth every 8 (eight) hours.    Dispense:  21 capsule    Refill:  0    Order Specific Question:   Supervising Provider    Answer:   Wynona Luna [phone]  . albuterol (PROVENTIL HFA;VENTOLIN HFA) 108 (90 Base) MCG/ACT inhaler 2 puff   Inhaler given in office.   Breathing treatment given in office.   Declines chest x-ray today Get plenty of rest and push fluids Use albuterol inhaler as needed for shortness of breath or wheezing Continue OTC  medications, such as zyrtec D, as needed for symptomatic relief Prescribed tessolone perles as needed for cough Follow up with PCP if symptoms persist Return or go to ER if you have any new or worsening symptoms including fever, chills, worsening cough, nausea, vomiting, abdominal pain,  shortness of breath, chest pain, etc....  Reviewed expectations re: course of current medical issues. Questions answered. Outlined signs and symptoms indicating need for more acute intervention. Patient verbalized understanding. After Visit Summary given.          Lestine Box, PA-C 08/23/18 1335    Wynona Luna, MD 09/03/18 [phone]

## 2018-09-17 ENCOUNTER — Other Ambulatory Visit: Payer: Self-pay | Admitting: Emergency Medicine

## 2018-09-26 ENCOUNTER — Other Ambulatory Visit: Payer: Self-pay | Admitting: Emergency Medicine

## 2018-10-18 ENCOUNTER — Telehealth: Payer: Self-pay | Admitting: Emergency Medicine

## 2018-10-18 MED ORDER — VALSARTAN 160 MG PO TABS: 160.0000 mg | ORAL_TABLET | Freq: Every day | ORAL | 0 refills | Status: DC

## 2018-10-18 NOTE — Telephone Encounter (Signed)
Patient has upcoming appointment 10/29/18, with Dr. Lamonte Sakai.  Valsartan refill #30, sent to [facility]. Patient notified of refill and reminded to keep her appointment with Dr. Lamonte Sakai 10/29/18.  Nothing further at this time.

## 2018-10-29 ENCOUNTER — Encounter: Payer: Self-pay | Admitting: Emergency Medicine

## 2018-10-29 ENCOUNTER — Ambulatory Visit: Payer: BLUE CROSS/BLUE SHIELD | Admitting: Emergency Medicine

## 2018-10-29 DIAGNOSIS — R911 Solitary pulmonary nodule: Secondary | ICD-10-CM | POA: Diagnosis not present

## 2018-10-29 MED ORDER — BUDESONIDE-FORMOTEROL FUMARATE 80-4.5 MCG/ACT IN AERO: 2.0000 | INHALATION_SPRAY | Freq: Every day | RESPIRATORY_TRACT | 12 refills | Status: DC

## 2018-10-29 MED ORDER — VALSARTAN 160 MG PO TABS: 160.0000 mg | ORAL_TABLET | Freq: Every day | ORAL | 11 refills | Status: DC

## 2018-10-29 MED ORDER — PANTOPRAZOLE SODIUM 40 MG PO TBEC: 40.0000 mg | DELAYED_RELEASE_TABLET | Freq: Every day | ORAL | 11 refills | Status: DC

## 2018-10-29 MED ORDER — BUDESONIDE-FORMOTEROL FUMARATE 80-4.5 MCG/ACT IN AERO: 2.0000 | INHALATION_SPRAY | Freq: Two times a day (BID) | RESPIRATORY_TRACT | 12 refills | Status: DC

## 2018-10-29 NOTE — Assessment & Plan Note (Signed)
Go back to PPI every day at least during the period of time when her cough is most problematic.

## 2018-10-29 NOTE — Assessment & Plan Note (Signed)
Mixed restriction and obstruction.  She will continue use albuterol as needed.  If the pattern changes, dyspnea progresses then we may need to reconsider putting her on a scheduled bronchodilator regimen.

## 2018-10-29 NOTE — Assessment & Plan Note (Signed)
Small nodules all less than 4 mm, no CT follow-up necessary unless there is clinical change

## 2018-10-29 NOTE — Assessment & Plan Note (Signed)
Multifactorial and usually most labile during the cold weather.  I asked her to go back on her pantoprazole on a schedule since this is the time of year when her cough is most active.  She will continue valsartan as a substitute for lisinopril.  We will refill this for her.

## 2018-10-29 NOTE — Progress Notes (Signed)
Subjective:    Patient ID: Samantha [name], female    DOB: May 12, 1956, 62 y.o.   MRN: [id number]  Shortness of Breath  Associated symptoms include chest pain, ear pain and headaches. Pertinent negatives include no fever, leg swelling, rash, rhinorrhea, sore throat, vomiting or wheezing.   62 year old never smoker with a history of hypertension, diabetes, hyperlipidemia, allergic rhinitis. She is referred today for evaluation of dyspnea, wheeze, mid-sternal stabbing CP to her back, wheeze. This dates back several years. She has been seen by cardiology, had a clean cath in 2009. ECG's have been normal. Has used albuterol with some relief but not full resolution. She may have had a URI in July, associated w cough and SOB. Treated w prednisone, then returned and received abx, several courses. CT chest 8/9 reviewed by me > minimal LLL atx, 28m RLL nodule. Experienced a syncopal episode 07/05/17, Ultimately admitted 07/12/17 with same CP and SOB. Repeat CT scan 07/13/17 reviewed by me > no PE, elevated L HD, L basilar atx.   She has dry cough for several years. She has allergies, seem to be improved on current meds. She has never been treated for GERD.                                                                                                                                                                                                                  She is currently on Astelin nasal spray, Zyrtec, fluticasone nasal spray, lisinopril 20 mg daily.   ROV 09/17/17 -- This follow-up visit for dyspnea and wheezing. Also with chest discomfort, cough, and a reassuring cardiac evaluation, suspect GERD. Evaluation has shown an elevated left hemidiaphragm. Sniff test was done on 08/20/17 and confirmed that the diaphragm does move normally. I treated her empirically with pantoprazole, changed her lisinopril to valsartan. She believes that her cough is largely unchanged. She still has the mid-epigastric pain  through to her back.   Pulmonary function testing performed on 10/22 was reviewed by me. This shows evidence for restriction both on lung volumes. There was some possible superimposed obstruction. Diffusion capacity was decreased, corrects to the normal range when adjusted for alveolar volume.   ROV 10/24/17 --this is a follow-up visit for never smoker with a history of dyspnea and cough in the setting of an elevated left hemidiaphragm and left basilar atelectasis.  Also with a 4 mm right basilar opacity of unclear significance.  Given its size no follow-up has been planned.  We have treated GERD and allergic  rhinitis as potential contributors to her cough.  Spirometry suggests some possible superimposed obstruction so we did a trial of Symbicort to see if she would benefit. She isn't sure that it has helped her any. She still has albuterol - has used it twice since last time.   ROV 10/29/18 --follow-up visit for pleasant 62 year old woman with history of cough, dyspnea, chest discomfort, small pulmonary nodular disease (all 4 mm or less).  She has some mixed obstruction and restriction on spirometry, has not ever significantly benefited from scheduled bronchodilator therapy.  It is been felt that GERD was a significant contributor to her chronic cough and she improved on a PPI.  Over the last week or so she has had some increased cough, some increased dyspnea with the cooler air. She does feel some reflux, is using pantoprazole prn. Flu shot up to date   Review of Systems  Constitutional: Negative for fever and unexpected weight change.  HENT: Positive for congestion, dental problem and ear pain. Negative for nosebleeds, postnasal drip, rhinorrhea, sinus pressure, sneezing, sore throat and trouble swallowing.   Eyes: Negative for redness and itching.  Respiratory: Positive for cough and shortness of breath. Negative for chest tightness and wheezing.   Cardiovascular: Positive for chest pain and  palpitations. Negative for leg swelling.  Gastrointestinal: Negative for nausea and vomiting.  Genitourinary: Negative for dysuria.  Musculoskeletal: Negative for joint swelling.  Skin: Negative for rash.  Neurological: Positive for headaches.  Hematological: Does not bruise/bleed easily.  Psychiatric/Behavioral: Negative for dysphoric mood. The patient is not nervous/anxious.     Past Medical History:  Diagnosis Date  . Arthritis   . Dyslipidemia   . Heart murmur 2011  . Left bundle branch block 2011  . Type II or unspecified type diabetes mellitus without mention of complication, not stated as uncontrolled   . Unspecified essential hypertension      Family History  Problem Relation Age of Onset  . Diabetes Mother   . Hypertension Mother   . CAD Mother   . Hypertension Father   . Diabetes Father   . CAD Father   . Diabetes Brother   . Hyperlipidemia Brother   . Hypertension Brother   . CAD Brother   . Diabetes Sister   . Hypertension Sister      Social History   Socioeconomic History  . Marital status: Married    Spouse name: Not on file  . Number of children: Not on file  . Years of education: Not on file  . Highest education level: Not on file  Occupational History  . Not on file  Social Needs  . Financial resource strain: Not on file  . Food insecurity:    Worry: Not on file    Inability: Not on file  . Transportation needs:    Medical: Not on file    Non-medical: Not on file  Tobacco Use  . Smoking status: Never Smoker  . Smokeless tobacco: Never Used  Substance and Sexual Activity  . Alcohol use: No  . Drug use: No  . Sexual activity: Not on file  Lifestyle  . Physical activity:    Days per week: Not on file    Minutes per session: Not on file  . Stress: Not on file  Relationships  . Social connections:    Talks on phone: Not on file    Gets together: Not on file    Attends religious service: Not on file    Active member  of club or  organization: Not on file    Attends meetings of clubs or organizations: Not on file    Relationship status: Not on file  . Intimate partner violence:    Fear of current or ex partner: Not on file    Emotionally abused: Not on file    Physically abused: Not on file    Forced sexual activity: Not on file  Other Topics Concern  . Not on file  Social History Narrative  . Not on file     Allergies  Allergen Reactions  . Iohexol Anaphylaxis     Code: SOB, Desc: respiratory distress-ASM, Onset Date: [id number]      Outpatient Medications Prior to Visit  Medication Sig Dispense Refill  . aspirin 81 MG tablet Take 81 mg by mouth every morning.     Marland Kitchen azelastine (ASTELIN) 0.1 % nasal spray Place into both nostrils daily as needed. Use in each nostril as directed     . benzonatate (TESSALON) 100 MG capsule Take 1 capsule (100 mg total) by mouth every 8 (eight) hours. 21 capsule 0  . budesonide-formoterol (SYMBICORT) 80-4.5 MCG/ACT inhaler Inhale 2 puffs into the lungs daily. 1 Inhaler 12  . Calcium Citrate-Vitamin D (CALCIUM + D PO) Take 1 tablet by mouth every morning.     . cetirizine (ZYRTEC) 10 MG tablet Take 10 mg by mouth daily.    . empagliflozin (JARDIANCE) 25 MG TABS tablet Take 25 mg by mouth daily.    Marland Kitchen FLUoxetine (PROZAC) 20 MG capsule Take 20 mg by mouth every morning.     . gabapentin (NEURONTIN) 300 MG capsule Take 300 mg by mouth 2 (two) times daily.    Marland Kitchen glimepiride (AMARYL) 2 MG tablet Take 2 mg by mouth 2 (two) times daily.     . Insulin Degludec (TRESIBA) 100 UNIT/ML SOLN Inject 10 Units into the skin 2 (two) times daily.    Marland Kitchen ipratropium-albuterol (DUONEB) 0.5-2.5 (3) MG/3ML SOLN Inhale 3 mLs into the lungs daily as needed (wheezing).     Marland Kitchen lovastatin (MEVACOR) 20 MG tablet Take 20 mg by mouth at bedtime.    . meloxicam (MOBIC) 15 MG tablet Take 15 mg by mouth daily as needed for pain.     . metFORMIN (GLUCOPHAGE) 1000 MG tablet Take 1,000 mg by mouth 2 (two) times daily  with a meal.    . nitroGLYCERIN (NITROSTAT) 0.4 MG SL tablet Place 1 tablet (0.4 mg total) under the tongue every 5 (five) minutes as needed for chest pain. 30 tablet 12  . Omega-3 Fatty Acids (FISH OIL) 1000 MG CAPS Take 1,000 mg by mouth every morning.     . pantoprazole (PROTONIX) 40 MG tablet Take 1 tablet (40 mg total) by mouth daily. 30 tablet 5  . valsartan (DIOVAN) 160 MG tablet Take 1 tablet (160 mg total) by mouth daily. 30 tablet 0  . verapamil (VERELAN PM) 240 MG 24 hr capsule Take 240 mg by mouth every morning.      No facility-administered medications prior to visit.         Objective:   Physical Exam Vitals:   10/29/18 0918  BP: (!) 158/82  Pulse: 74  SpO2: 97%  Weight: 233 lb (105.7 kg)  Height: '5\' 9"'$  (1.753 m)   Gen: Pleasant, overweight, in no distress,  normal affect  ENT: No lesions,  mouth clear,  oropharynx clear, no postnasal drip  Neck: No JVD, no stridor  Lungs: No use of accessory  muscles, clear without rales or rhonchi, no crackles or wheezes  Cardiovascular: RRR, heart sounds normal, no murmur or gallops, no peripheral edema  Musculoskeletal: No deformities, no cyanosis or clubbing  Neuro: alert, non focal  Skin: Warm, no lesions or rashes    CT  Chest 07/13/17 --  COMPARISON:  Chest CT July 04, 2017; chest radiograph July 12, 2017  FINDINGS: Cardiovascular: There is no demonstrable pulmonary embolus. There is no appreciable thoracic aortic aneurysm or dissection. Visualized great vessels appear unremarkable. There is atherosclerotic calcification in the aortic arch region. There are mild foci of coronary artery calcification. Pericardium does appreciably thickened.  Mediastinum/Nodes: No thyroid lesions are evident. There are occasional subcentimeter lymph nodes. There is no adenopathy in the thoracic region by size criteria. No esophageal lesions are appreciable.  Lungs/Pleura: There is elevation of the left hemidiaphragm  with atelectatic change in the left base. A 4 mm nodular opacity in the posterior segment right lower lobe is seen on axial slice 72 series 7, better seen on recent CT. Differences in appearance in this area likely due to mild motion in the lung bases on the current examination. No new parenchymal nodular lesion evident. No consolidation or edema evident. No appreciable pleural effusion or pleural thickening.  Upper Abdomen: There is incomplete visualization of an apparent adenoma arising from the left adrenal measuring 9 x 8 mm. Visualized upper abdominal structures otherwise appear unremarkable.  Musculoskeletal: There is mild degenerative change in the thoracic spine. There are no blastic or lytic bone lesions.  Review of the MIP images confirms the above findings.  IMPRESSION: 1.  No demonstrable pulmonary embolus.  2. Foci of atherosclerotic calcification in the aorta. No aneurysm or dissection in the aorta. Mild coronary artery calcification noted.  3. Stable mild elevation of the left hemidiaphragm with left base atelectasis. Stable 4 mm nodular opacity posterior right base, better seen on recent prior study due to mild motion at this time. No follow-up needed if patient is low-risk. Non-contrast chest CT can be considered in 12 months if patient is high-risk. This recommendation follows the consensus statement: Guidelines for Management of Incidental Pulmonary Nodules Detected on CT Images: From the Fleischner Society 2017; Radiology 2017; 284:228-243.  4.  No evident adenopathy.  5. Incomplete visualization of apparent 9 x 8 mm left adrenal adenoma.      Assessment & Plan:  Pulmonary nodule, right Small nodules all less than 4 mm, no CT follow-up necessary unless there is clinical change  Cough Multifactorial and usually most labile during the cold weather.  I asked her to go back on her pantoprazole on a schedule since this is the time of year when her  cough is most active.  She will continue valsartan as a substitute for lisinopril.  We will refill this for her.  GERD (gastroesophageal reflux disease) Go back to PPI every day at least during the period of time when her cough is most problematic.  Dyspnea Mixed restriction and obstruction.  She will continue use albuterol as needed.  If the pattern changes, dyspnea progresses then we may need to reconsider putting her on a scheduled bronchodilator regimen.  Baltazar Apo, MD, PhD 10/29/2018, 9:41 AM [facility] [phone] or if no answer [phone]

## 2018-10-29 NOTE — Patient Instructions (Signed)
Please continue to keep your albuterol available use 2 puffs if you needed for shortness of breath, chest tightness, wheezing. Restart your pantoprazole 40 mg daily every day through the winter months since this is the time when your cough and short windedness are most active.  You can probably switch back to taking it only as needed when it is warmer. Please continue your valsartan as you have been taking it.  We will refill this for you. Follow with Dr. Lamonte Sakai in 12 months or sooner if you have any problems.

## 2019-04-09 ENCOUNTER — Ambulatory Visit (HOSPITAL_COMMUNITY)
Admission: EM | Admit: 2019-04-09 | Discharge: 2019-04-09 | Disposition: A | Discharge status: Home / Self Care | Payer: BLUE CROSS/BLUE SHIELD | Attending: Family Medicine | Admitting: Family Medicine

## 2019-04-09 ENCOUNTER — Encounter (HOSPITAL_COMMUNITY): Payer: Self-pay

## 2019-04-09 ENCOUNTER — Other Ambulatory Visit: Payer: Self-pay

## 2019-04-09 DIAGNOSIS — J0100 Acute maxillary sinusitis, unspecified: Secondary | ICD-10-CM

## 2019-04-09 MED ORDER — CEFDINIR 300 MG PO CAPS: 300.0000 mg | ORAL_CAPSULE | Freq: Two times a day (BID) | ORAL | 0 refills | Status: DC

## 2019-04-09 NOTE — ED Provider Notes (Signed)
[facility]   [id number] 04/09/19 Arrival Time: 3545  ASSESSMENT & PLAN:  1. Acute non-recurrent maxillary sinusitis    Stop amoxicillin. Begin: Meds ordered this encounter  Medications  . cefdinir (OMNICEF) 300 MG capsule    Sig: Take 1 capsule (300 mg total) by mouth 2 (two) times daily.    Dispense:  20 capsule    Refill:  0   OTC symptom care as needed. Ensure adequate fluid intake and rest.  Follow-up Information    Gregor Hams, FNP.   Specialty:  Family Medicine Why:  As needed. Contact information: [place] [address] [phone]          Reviewed expectations re: course of current medical issues. Questions answered. Outlined signs and symptoms indicating need for more acute intervention. Patient verbalized understanding. After Visit Summary given.   SUBJECTIVE: History from: patient.  Samantha [name] is a 63 y.o. female who presents with complaint of nasal congestion, post-nasal drainage, and sinus pain. Onset gradual, > 1 week ago. Respiratory symptoms: none. Fever: absent. Overall normal PO intake without n/v. OTC treatment: daily anti-histamine. Seasonal allergies: she is unsure; "maybe". History of frequent sinus infections: yes, "get them every year". No specific aggravating or alleviating factors reported. Was placed on amoxicillin 2-3 days ago; reports no change in symptoms. Now feeling pain in her teeth. Mainly R maxillary and frontal sinus pressure.  Reports no suspected COVID-19 exposure.  Social History   Tobacco Use  Smoking Status Never Smoker  Smokeless Tobacco Never Used   ROS: As per HPI. All other systems negative.  OBJECTIVE:  Vitals:   04/09/19 1112  BP: (!) 138/53  Pulse: 84  Resp: 16  Temp: 98.6 F (37 C)  TempSrc: Oral  SpO2: 95%    General appearance: alert; no distress HEENT: nasal congestion; clear runny nose; throat irritation secondary to post-nasal drainage; bilateral  (R>L) maxillary tenderness to palpation; turbinates boggy Neck: supple without LAD; trachea midline CH: RRR Lungs: unlabored respirations, symmetrical air entry; cough: absent; no respiratory distress Skin: warm and dry Ext: no LE edema Psychological: alert and cooperative; normal mood and affect  Allergies  Allergen Reactions  . Iohexol Anaphylaxis     Code: SOB, Desc: respiratory distress-ASM, Onset Date: [id number]     Past Medical History:  Diagnosis Date  . Arthritis   . Dyslipidemia   . Heart murmur 2011  . Left bundle branch block 2011  . Type II or unspecified type diabetes mellitus without mention of complication, not stated as uncontrolled   . Unspecified essential hypertension    Family History  Problem Relation Age of Onset  . Diabetes Mother   . Hypertension Mother   . CAD Mother   . Hypertension Father   . Diabetes Father   . CAD Father   . Diabetes Brother   . Hyperlipidemia Brother   . Hypertension Brother   . CAD Brother   . Diabetes Sister   . Hypertension Sister    Social History   Socioeconomic History  . Marital status: Married    Spouse name: Not on file  . Number of children: Not on file  . Years of education: Not on file  . Highest education level: Not on file  Occupational History  . Not on file  Social Needs  . Financial resource strain: Not on file  . Food insecurity:    Worry: Not on file    Inability: Not on file  . Transportation  needs:    Medical: Not on file    Non-medical: Not on file  Tobacco Use  . Smoking status: Never Smoker  . Smokeless tobacco: Never Used  Substance and Sexual Activity  . Alcohol use: No  . Drug use: No  . Sexual activity: Not on file  Lifestyle  . Physical activity:    Days per week: Not on file    Minutes per session: Not on file  . Stress: Not on file  Relationships  . Social connections:    Talks on phone: Not on file    Gets together: Not on file    Attends religious service: Not on  file    Active member of club or organization: Not on file    Attends meetings of clubs or organizations: Not on file    Relationship status: Not on file  . Intimate partner violence:    Fear of current or ex partner: Not on file    Emotionally abused: Not on file    Physically abused: Not on file    Forced sexual activity: Not on file  Other Topics Concern  . Not on file  Social History Narrative  . Not on file            Vanessa Kick, MD 04/15/19 [phone]

## 2019-04-09 NOTE — ED Triage Notes (Signed)
Pt presents with sinus drainage and facial pain on the right side x 1 week. Denies any other symptoms. Pt has been taking amoxicillin since Monday with no improvement.

## 2019-07-29 ENCOUNTER — Other Ambulatory Visit: Payer: Self-pay

## 2019-07-29 ENCOUNTER — Encounter (HOSPITAL_COMMUNITY): Payer: Self-pay | Admitting: *Deleted

## 2019-07-29 ENCOUNTER — Emergency Department (HOSPITAL_COMMUNITY)
Admission: EM | Admit: 2019-07-29 | Discharge: 2019-07-30 | Disposition: A | Discharge status: Home / Self Care | Payer: Medicare Other | Attending: Emergency Medicine | Admitting: Emergency Medicine

## 2019-07-29 ENCOUNTER — Encounter (HOSPITAL_COMMUNITY): Payer: Self-pay | Admitting: Emergency Medicine

## 2019-07-29 ENCOUNTER — Ambulatory Visit (INDEPENDENT_AMBULATORY_CARE_PROVIDER_SITE_OTHER)
Admission: EM | Admit: 2019-07-29 | Discharge: 2019-07-29 | Disposition: A | Discharge status: Home / Self Care | Payer: Medicare Other | Source: Home / Self Care | Attending: Family Medicine | Admitting: Family Medicine

## 2019-07-29 DIAGNOSIS — R109 Unspecified abdominal pain: Secondary | ICD-10-CM

## 2019-07-29 DIAGNOSIS — Z7982 Long term (current) use of aspirin: Secondary | ICD-10-CM | POA: Insufficient documentation

## 2019-07-29 DIAGNOSIS — I10 Essential (primary) hypertension: Secondary | ICD-10-CM | POA: Diagnosis not present

## 2019-07-29 DIAGNOSIS — E119 Type 2 diabetes mellitus without complications: Secondary | ICD-10-CM | POA: Diagnosis not present

## 2019-07-29 DIAGNOSIS — Z79899 Other long term (current) drug therapy: Secondary | ICD-10-CM | POA: Diagnosis not present

## 2019-07-29 DIAGNOSIS — R1030 Lower abdominal pain, unspecified: Secondary | ICD-10-CM

## 2019-07-29 DIAGNOSIS — Z7984 Long term (current) use of oral hypoglycemic drugs: Secondary | ICD-10-CM | POA: Insufficient documentation

## 2019-07-29 DIAGNOSIS — R1032 Left lower quadrant pain: Secondary | ICD-10-CM | POA: Diagnosis not present

## 2019-07-29 LAB — CBC
HCT: 37.7 % (ref 36.0–46.0)
Hemoglobin: 11.9 g/dL — ABNORMAL LOW (ref 12.0–15.0)
MCH: 28.2 pg (ref 26.0–34.0)
MCHC: 31.6 g/dL (ref 30.0–36.0)
MCV: 89.3 fL (ref 80.0–100.0)
Platelets: 322 10*3/uL (ref 150–400)
RBC: 4.22 MIL/uL (ref 3.87–5.11)
RDW: 14 % (ref 11.5–15.5)
WBC: 12.3 10*3/uL — ABNORMAL HIGH (ref 4.0–10.5)
nRBC: 0 % (ref 0.0–0.2)

## 2019-07-29 LAB — COMPREHENSIVE METABOLIC PANEL
ALT: 27 U/L (ref 0–44)
AST: 16 U/L (ref 15–41)
Albumin: 3.5 g/dL (ref 3.5–5.0)
Alkaline Phosphatase: 55 U/L (ref 38–126)
Anion gap: 13 (ref 5–15)
BUN: 23 mg/dL (ref 8–23)
CO2: 24 mmol/L (ref 22–32)
Calcium: 8.5 mg/dL — ABNORMAL LOW (ref 8.9–10.3)
Chloride: 101 mmol/L (ref 98–111)
Creatinine, Ser: 1.12 mg/dL — ABNORMAL HIGH (ref 0.44–1.00)
GFR calc Af Amer: 60 mL/min — ABNORMAL LOW (ref >60)
GFR calc non Af Amer: 52 mL/min — ABNORMAL LOW (ref >60)
Glucose, Bld: 125 mg/dL — ABNORMAL HIGH (ref 70–99)
Potassium: 4 mmol/L (ref 3.5–5.1)
Sodium: 138 mmol/L (ref 135–145)
Total Bilirubin: 0.5 mg/dL (ref 0.3–1.2)
Total Protein: 7.1 g/dL (ref 6.5–8.1)

## 2019-07-29 LAB — URINALYSIS, ROUTINE W REFLEX MICROSCOPIC
Bilirubin Urine: NEGATIVE
Glucose, UA: NEGATIVE mg/dL
Hgb urine dipstick: NEGATIVE
Ketones, ur: NEGATIVE mg/dL
Leukocytes,Ua: NEGATIVE
Nitrite: NEGATIVE
Protein, ur: NEGATIVE mg/dL
Specific Gravity, Urine: 1.017 (ref 1.005–1.030)
pH: 5 (ref 5.0–8.0)

## 2019-07-29 LAB — LIPASE, BLOOD: Lipase: 21 U/L (ref 11–51)

## 2019-07-29 MED ORDER — SODIUM CHLORIDE 0.9% FLUSH
3.0000 mL | Freq: Once | INTRAVENOUS | Status: AC
  Administered 2019-07-30: 3 mL via INTRAVENOUS

## 2019-07-29 NOTE — ED Triage Notes (Signed)
Pt reports LLQ pain x3 months.  Pt states it subsided a bit in July, and she saw here PCP who states she felt a hernia.  Pt is here today for increased pain.

## 2019-07-29 NOTE — ED Notes (Signed)
Patient is being discharged from the [facility] and sent to the Emergency Department. Per Dr. Meda Coffee, patient is stable but in need of higher level of care due to need for CT scan per PCP recommendation. Pt thought the [facility] had a CT scanner. Patient is aware and verbalizes understanding of plan of care.  Vitals:   07/29/19 1556  BP: (!) 142/56  Pulse: 68  Temp: (!) 97.5 F (36.4 C)  SpO2: 95%

## 2019-07-29 NOTE — ED Triage Notes (Signed)
To ED for eval of left lower abd pain for the past few months but worse over the past few days. Seen at [facility] today and then sent to ED for scans. Nausea - no vomiting. BM and urination wnl per pt.

## 2019-07-29 NOTE — ED Provider Notes (Signed)
[facility]    CSN: [id number] Arrival date & time: 07/29/19  1528      History   Chief Complaint Chief Complaint  Patient presents with  . Abdominal Pain    LLQ    HPI Samantha [name] is a 63 y.o. female.   HPI  Patient's been having left lower quadrant pain for months.  Off and on.  She spoke with her physician about it.  It was postulated she might have diverticulitis.  She does have diverticula on both of her colonoscopies.  Also benign polyps.  She is never been treated for diverticulitis.  She is currently having left lower quadrant pain.  Decreased appetite.  Nausea.  No vomiting.  No diarrhea.  No mucus or blood in the bowels.  No fever or chills.  She is here today because her doctor told her if it happened again she should get a CAT scan.  She is requesting a CAT scan.  When she was informed that we do not do CAT scans at the urgent care center she chose to go to the ER  Past Medical History:  Diagnosis Date  . Arthritis   . Dyslipidemia   . Heart murmur 2011  . Left bundle branch block 2011  . Type II or unspecified type diabetes mellitus without mention of complication, not stated as uncontrolled   . Unspecified essential hypertension     Patient Active Problem List   Diagnosis Date Noted  . GERD (gastroesophageal reflux disease) 10/24/2017  . Cough 08/14/2017  . Dyspnea 08/14/2017  . Pulmonary nodule, right 08/14/2017  . Chest pain 07/12/2017  . LBBB (left bundle branch block) 08/10/2016  . Leukocytosis 08/10/2016  . Hyperlipidemia 08/10/2016  . Essential hypertension 08/10/2016  . Pain in the chest 08/09/2016  . Diabetes ([place]) 11/20/2015    Past Surgical History:  Procedure Laterality Date  . TONSILLECTOMY  1962    OB History   No obstetric history on file.      Home Medications    Prior to Admission medications   Medication Sig Start Date End Date Taking? Authorizing Provider  aspirin 81 MG tablet Take 81 mg by mouth every  morning.    Yes [provider]  Calcium Citrate-Vitamin D (CALCIUM + D PO) Take 1 tablet by mouth every morning.    Yes [provider]  cetirizine (ZYRTEC) 10 MG tablet Take 10 mg by mouth daily.   Yes [provider]  empagliflozin (JARDIANCE) 25 MG TABS tablet Take 25 mg by mouth daily.   Yes [provider]  FLUoxetine (PROZAC) 20 MG capsule Take 20 mg by mouth every morning.    Yes [provider]  gabapentin (NEURONTIN) 300 MG capsule Take 300 mg by mouth 2 (two) times daily.   Yes [provider]  glimepiride (AMARYL) 2 MG tablet Take 2 mg by mouth 2 (two) times daily.    Yes [provider]  Insulin Degludec (TRESIBA) 100 UNIT/ML SOLN Inject 10 Units into the skin 2 (two) times daily.   Yes [provider]  lovastatin (MEVACOR) 20 MG tablet Take 20 mg by mouth at bedtime.   Yes [provider]  metFORMIN (GLUCOPHAGE) 1000 MG tablet Take 1,000 mg by mouth 2 (two) times daily with a meal.   Yes [provider]  Omega-3 Fatty Acids (FISH OIL) 1000 MG CAPS Take 1,000 mg by mouth every morning.    Yes [provider]  pantoprazole (PROTONIX) 40 MG tablet  Take 1 tablet (40 mg total) by mouth daily. 10/29/18  Yes Collene Gobble, MD  valsartan (DIOVAN) 160 MG tablet Take 1 tablet (160 mg total) by mouth daily. 10/29/18  Yes Collene Gobble, MD  verapamil (VERELAN PM) 240 MG 24 hr capsule Take 240 mg by mouth every morning.    Yes [provider]  azelastine (ASTELIN) 0.1 % nasal spray Place into both nostrils daily as needed. Use in each nostril as directed     [provider]  benzonatate (TESSALON) 100 MG capsule Take 1 capsule (100 mg total) by mouth every 8 (eight) hours. 08/23/18   Wurst, [place], PA-C  budesonide-formoterol (SYMBICORT) 80-4.5 MCG/ACT inhaler Inhale 2 puffs into the lungs 2 (two) times daily. 10/29/18   Collene Gobble, MD  cefdinir (OMNICEF) 300 MG capsule Take 1  capsule (300 mg total) by mouth 2 (two) times daily. 04/09/19   Vanessa Kick, MD  ipratropium-albuterol (DUONEB) 0.5-2.5 (3) MG/3ML SOLN Inhale 3 mLs into the lungs daily as needed (wheezing).  06/25/17   [provider]  meloxicam (MOBIC) 15 MG tablet Take 15 mg by mouth daily as needed for pain.  07/29/16   [provider]  nitroGLYCERIN (NITROSTAT) 0.4 MG SL tablet Place 1 tablet (0.4 mg total) under the tongue every 5 (five) minutes as needed for chest pain. 08/10/16   Theodis Blaze, MD    Family History Family History  Problem Relation Age of Onset  . Diabetes Mother   . Hypertension Mother   . CAD Mother   . Hypertension Father   . Diabetes Father   . CAD Father   . Diabetes Brother   . Hyperlipidemia Brother   . Hypertension Brother   . CAD Brother   . Diabetes Sister   . Hypertension Sister     Social History Social History   Tobacco Use  . Smoking status: Never Smoker  . Smokeless tobacco: Never Used  Substance Use Topics  . Alcohol use: No  . Drug use: No     Allergies   Iohexol   Review of Systems Review of Systems  Constitutional: Negative for chills and fever.  HENT: Negative for ear pain and sore throat.   Eyes: Negative for pain and visual disturbance.  Respiratory: Negative for cough and shortness of breath.   Cardiovascular: Negative for chest pain and palpitations.  Gastrointestinal: Positive for abdominal pain and nausea. Negative for vomiting.  Genitourinary: Negative for dysuria and hematuria.  Musculoskeletal: Negative for arthralgias and back pain.  Skin: Negative for color change and rash.  Neurological: Negative for seizures and syncope.  All other systems reviewed and are negative.    Physical Exam Triage Vital Signs ED Triage Vitals  Enc Vitals Group     BP 07/29/19 1556 (!) 142/56     Pulse Rate 07/29/19 1556 68     Resp --      Temp 07/29/19 1556 (!) 97.5 F (36.4 C)     Temp Source 07/29/19 1556 Oral      SpO2 07/29/19 1556 95 %     Weight --      Height --      Head Circumference --      Peak Flow --      Pain Score 07/29/19 1554 8     Pain Loc --      Pain Edu? --      Excl. in [place]? --    No data found.  Updated Vital  Signs BP (!) 142/56 (BP Location: Left Arm)   Pulse 68   Temp (!) 97.5 F (36.4 C) (Oral)   SpO2 95%   Visual Acuity Right Eye Distance:   Left Eye Distance:   Bilateral Distance:    Right Eye Near:   Left Eye Near:    Bilateral Near:     Physical Exam Constitutional:      General: She is not in acute distress.    Appearance: She is well-developed.  HENT:     Head: Normocephalic and atraumatic.  Eyes:     Conjunctiva/sclera: Conjunctivae normal.     Pupils: Pupils are equal, round, and reactive to light.  Neck:     Musculoskeletal: Normal range of motion.  Cardiovascular:     Rate and Rhythm: Normal rate.  Pulmonary:     Effort: Pulmonary effort is normal. No respiratory distress.  Abdominal:     General: Bowel sounds are normal. There is no distension.     Palpations: Abdomen is soft.     Tenderness: There is abdominal tenderness in the left lower quadrant. There is guarding. There is no rebound.     Comments: Obese abdomen.  Bowel sounds active.  Tender to palpation in the left lower quadrant.  Tender to deep palpation.  Mild guarding.  No rebound.  No mass palpable.  No organomegaly  Musculoskeletal: Normal range of motion.  Skin:    General: Skin is warm and dry.  Neurological:     Mental Status: She is alert.      UC Treatments / Results  Labs (all labs ordered are listed, but only abnormal results are displayed) Labs Reviewed - No data to display  EKG   Radiology No results found.  Procedures Procedures (including critical care time)  Medications Ordered in UC Medications - No data to display  Initial Impression / Assessment and Plan / UC Course  I have reviewed the triage vital signs and the nursing notes.  Pertinent  labs & imaging results that were available during my care of the patient were reviewed by me and considered in my medical decision making (see chart for details).      Final Clinical Impressions(s) / UC Diagnoses   Final diagnoses:  Lower abdominal pain     Discharge Instructions     GO TO ER   ED Prescriptions    None     Controlled Substance Prescriptions [place] Controlled Substance Registry consulted? Not Applicable   Raylene Everts, MD 07/29/19 2136

## 2019-07-29 NOTE — Discharge Instructions (Addendum)
GO TO ER °

## 2019-07-30 ENCOUNTER — Emergency Department (HOSPITAL_COMMUNITY): Payer: Medicare Other

## 2019-07-30 MED ORDER — FENTANYL CITRATE (PF) 100 MCG/2ML IJ SOLN
50.0000 ug | Freq: Once | INTRAMUSCULAR | Status: AC
  Administered 2019-07-30: 50 ug via INTRAVENOUS
  Filled 2019-07-30: qty 2

## 2019-07-30 MED ORDER — ONDANSETRON 4 MG PO TBDP: ORAL_TABLET | ORAL | 0 refills | Status: DC

## 2019-07-30 MED ORDER — BARIUM SULFATE 2.1 % PO SUSP
ORAL | Status: AC
  Filled 2019-07-30: qty 2

## 2019-07-30 MED ORDER — ONDANSETRON HCL 4 MG/2ML IJ SOLN
4.0000 mg | Freq: Once | INTRAMUSCULAR | Status: AC
  Administered 2019-07-30: 4 mg via INTRAVENOUS
  Filled 2019-07-30: qty 2

## 2019-07-30 MED ORDER — LACTATED RINGERS IV BOLUS
1000.0000 mL | Freq: Once | INTRAVENOUS | Status: AC
  Administered 2019-07-30: 1000 mL via INTRAVENOUS

## 2019-07-30 NOTE — ED Provider Notes (Signed)
Emergency Department Provider Note   I have reviewed the triage vital signs and the nursing notes.   HISTORY  Chief Complaint  Abdominal Pain   HPI Laylanie Hofacker is a 63 y.o. female who presents the emergency department today with left lower quadrant abdominal pain.  Patient states that she had this pain off and on but is become more frequent and more severe and today is the worst of all.  She was told she had diverticulosis on her previous colonoscopy.  Her physician told her that if this pain ever return to get a CT scan immediately and thus she went to urgent care earlier today who sent her here for CT scan.  She has some nausea and decreased appetite but no vomiting.  No diarrhea or constipation.  No fevers.  No pain with bumps or walking.  Just pain on palpation of left lower quadrant.   No other associated or modifying symptoms.    Past Medical History:  Diagnosis Date   Arthritis    Dyslipidemia    Heart murmur 2011   Left bundle branch block 2011   Type II or unspecified type diabetes mellitus without mention of complication, not stated as uncontrolled    Unspecified essential hypertension     Patient Active Problem List   Diagnosis Date Noted   GERD (gastroesophageal reflux disease) 10/24/2017   Cough 08/14/2017   Dyspnea 08/14/2017   Pulmonary nodule, right 08/14/2017   Chest pain 07/12/2017   LBBB (left bundle branch block) 08/10/2016   Leukocytosis 08/10/2016   Hyperlipidemia 08/10/2016   Essential hypertension 08/10/2016   Pain in the chest 08/09/2016   Diabetes ([place]) 11/20/2015    Past Surgical History:  Procedure Laterality Date   TONSILLECTOMY  1962    Current Outpatient Rx   Order #: [id number] Class: Historical Med   Order #: [id number] Class: Historical Med   Order #: [id number] Class: Normal   Order #: [id number] Class: Normal   Order #: [id number] Class: Historical Med   Order #: [id number] Class: Normal   Order #:  [id number] Class: Historical Med   Order #: [id number] Class: Historical Med   Order #: [id number] Class: Historical Med   Order #: [id number] Class: Historical Med   Order #: [id number] Class: Historical Med   Order #: [id number] Class: Historical Med   Order #: [id number] Class: Historical Med   Order #: [id number] Class: Historical Med   Order #: [id number] Class: Historical Med   Order #: [id number] Class: Historical Med   Order #: [id number] Class: Normal   Order #: [id number] Class: Historical Med   Order #: [id number] Class: Normal   Order #: [id number] Class: Normal   Order #: [id number] Class: Normal   Order #: [id number] Class: Historical Med    Allergies Iohexol  Family History  Problem Relation Age of Onset   Diabetes Mother    Hypertension Mother    CAD Mother    Hypertension Father    Diabetes Father    CAD Father    Diabetes Brother    Hyperlipidemia Brother    Hypertension Brother    CAD Brother    Diabetes Sister    Hypertension Sister     Social History Social History   Tobacco Use   Smoking status: Never Smoker   Smokeless tobacco: Never Used  Substance Use Topics   Alcohol use: No   Drug use: No    Review of Systems  All other systems negative except as documented in the HPI. All pertinent positives and negatives as reviewed in the HPI. ____________________________________________   PHYSICAL EXAM:  VITAL  SIGNS: ED Triage Vitals  Enc Vitals Group     BP 07/29/19 1711 (!) 157/62     Pulse Rate 07/29/19 1711 65     Resp 07/29/19 1711 16     Temp 07/29/19 1711 97.6 F (36.4 C)     Temp Source 07/29/19 1711 Oral     SpO2 07/29/19 1711 95 %     Weight --      Height --      Head Circumference --      Peak Flow --      Pain Score 07/29/19 1712 8     Pain Loc --      Pain Edu? --      Excl. in [place]? --     Constitutional: Alert and oriented. Well appearing and in no acute distress. Eyes: Conjunctivae are normal. PERRL. EOMI. Head:  Atraumatic. Nose: No congestion/rhinnorhea. Mouth/Throat: Mucous membranes are moist.  Oropharynx non-erythematous. Neck: No stridor.  No meningeal signs.   Cardiovascular: Normal rate, regular rhythm. Good peripheral circulation. Grossly normal heart sounds.   Respiratory: Normal respiratory effort.  No retractions. Lungs CTAB. Gastrointestinal: Soft and ttp in LLQ. No distention.  Musculoskeletal: No lower extremity tenderness nor edema. No gross deformities of extremities. Neurologic:  Normal speech and language. No gross focal neurologic deficits are appreciated.  Skin:  Skin is warm, dry and intact. No rash noted.   ____________________________________________   LABS (all labs ordered are listed, but only abnormal results are displayed)  Labs Reviewed  COMPREHENSIVE METABOLIC PANEL - Abnormal; Notable for the following components:      Result Value   Glucose, Bld 125 (*)    Creatinine, Ser 1.12 (*)    Calcium 8.5 (*)    GFR calc non Af Amer 52 (*)    GFR calc Af Amer 60 (*)    All other components within normal limits  CBC - Abnormal; Notable for the following components:   WBC 12.3 (*)    Hemoglobin 11.9 (*)    All other components within normal limits  LIPASE, BLOOD  URINALYSIS, ROUTINE W REFLEX MICROSCOPIC   ____________________________________________  RADIOLOGY  Ct Abdomen Pelvis Wo Contrast  Result Date: 07/30/2019 CLINICAL DATA:  Abdominal pain, diverticulitis suspected, worsening lower abdominal pain and nausea EXAM: CT ABDOMEN AND PELVIS WITHOUT CONTRAST TECHNIQUE: Multidetector CT imaging of the abdomen and pelvis was performed following the standard protocol without IV contrast. COMPARISON:  CT 08/24/2009 FINDINGS: Lower chest: Chronic elevation of the left hemidiaphragm, similar to prior studies with adjacent atelectatic changes. Lung bases are otherwise clear. Atherosclerotic calcification of the coronary arteries. Normal heart size. No pericardial effusion.  Hepatobiliary: No focal liver abnormality is seen. No gallstones, gallbladder wall thickening, or biliary dilatation. Pancreas: Fatty replacement of the pancreas. No pancreatic ductal dilatation or surrounding inflammatory changes. Spleen: Normal in size without focal abnormality. Adrenals/Urinary Tract: Normal adrenal glands Mild bilateral nonspecific perinephric stranding, a nonspecific finding though may correlate with either age or decreased renal function. Kidneys are otherwise unremarkable, without renal calculi, suspicious lesion, or hydronephrosis. Bladder is unremarkable. Stomach/Bowel: Distal esophagus, stomach and duodenal sweep are unremarkable. No bowel wall thickening or dilatation. No evidence of obstruction. A normal appendix is visualized. Scattered colonic diverticula without focal pericolonic inflammation to suggest diverticulitis. Vascular/Lymphatic: Atherosclerotic plaque within the normal caliber aorta. Scattered prominent but nonenlarged mesenteric lymph nodes are unchanged from prior studies. No suspicious or enlarged lymph nodes in the included lymphatic chains. Reproductive: Normal appearance of the uterus and adnexal  structures. Other: No abdominopelvic free fluid or free gas. No bowel containing hernias. Mild posterior body wall edema. Musculoskeletal: Multilevel degenerative changes are present in the imaged portions of the spine. Multilevel calcified disc protrusions in the lumbar spine result in mild multilevel canal stenosis and moderate bilateral foraminal narrowing at L3-4. No acute osseous abnormality or suspicious osseous lesion. IMPRESSION: No acute intra-abdominal process. Normal appendix, colonic diverticulosis without evidence of diverticulitis. Aortic Atherosclerosis (ICD10-I70.0). Electronically Signed   By: Lovena Le M.D.   On: 07/30/2019 04:21    ____________________________________________   PROCEDURES  Procedure(s) performed:    Procedures   ____________________________________________   INITIAL IMPRESSION / ASSESSMENT AND PLAN / ED COURSE  No e/o diverticulitis or other intraabdominal catastrophe requiring further imaging/workup or admission. DC to fu w/ pcp.  Pertinent labs & imaging results that were available during my care of the patient were reviewed by me and considered in my medical decision making (see chart for details).  A medical screening exam was performed and I feel the patient has had an appropriate workup for their chief complaint at this time and likelihood of emergent condition existing is low. They have been counseled on decision, discharge, follow up and which symptoms necessitate immediate return to the emergency department. They or their family verbally stated understanding and agreement with plan and discharged in stable condition.   ____________________________________________  FINAL CLINICAL IMPRESSION(S) / ED DIAGNOSES  Final diagnoses:  Abdominal pain, unspecified abdominal location     MEDICATIONS GIVEN DURING THIS VISIT:  Medications  Barium Sulfate 2.1 % SUSP (has no administration in time range)  sodium chloride flush (NS) 0.9 % injection 3 mL (3 mLs Intravenous Given 07/30/19 0126)  fentaNYL (SUBLIMAZE) injection 50 mcg (50 mcg Intravenous Given 07/30/19 0120)  ondansetron (ZOFRAN) injection 4 mg (4 mg Intravenous Given 07/30/19 0120)  lactated ringers bolus 1,000 mL (1,000 mLs Intravenous New Bag/Given 07/30/19 0126)     NEW OUTPATIENT MEDICATIONS STARTED DURING THIS VISIT:  New Prescriptions   ONDANSETRON (ZOFRAN ODT) 4 MG DISINTEGRATING TABLET    4mg  ODT q4 hours prn nausea/vomit    Note:  This note was prepared with assistance of Dragon voice recognition software. Occasional wrong-word or sound-a-like substitutions may have occurred due to the inherent limitations of voice recognition software.   Omayra Tulloch, Corene Cornea, MD 07/30/19 [phone]

## 2019-07-30 NOTE — ED Notes (Signed)
Patient transported to CT 

## 2019-08-10 ENCOUNTER — Emergency Department (HOSPITAL_COMMUNITY): Payer: Medicare Other

## 2019-08-10 ENCOUNTER — Emergency Department (HOSPITAL_COMMUNITY)
Admission: EM | Admit: 2019-08-10 | Discharge: 2019-08-11 | Disposition: A | Discharge status: Home / Self Care | Payer: Medicare Other | Attending: Emergency Medicine | Admitting: Emergency Medicine

## 2019-08-10 ENCOUNTER — Encounter (HOSPITAL_COMMUNITY): Payer: Self-pay

## 2019-08-10 ENCOUNTER — Other Ambulatory Visit: Payer: Self-pay

## 2019-08-10 DIAGNOSIS — I10 Essential (primary) hypertension: Secondary | ICD-10-CM | POA: Insufficient documentation

## 2019-08-10 DIAGNOSIS — Z7982 Long term (current) use of aspirin: Secondary | ICD-10-CM | POA: Insufficient documentation

## 2019-08-10 DIAGNOSIS — R0600 Dyspnea, unspecified: Secondary | ICD-10-CM

## 2019-08-10 DIAGNOSIS — R609 Edema, unspecified: Secondary | ICD-10-CM | POA: Diagnosis not present

## 2019-08-10 DIAGNOSIS — Z20828 Contact with and (suspected) exposure to other viral communicable diseases: Secondary | ICD-10-CM | POA: Diagnosis not present

## 2019-08-10 DIAGNOSIS — R079 Chest pain, unspecified: Secondary | ICD-10-CM | POA: Diagnosis present

## 2019-08-10 DIAGNOSIS — Z79899 Other long term (current) drug therapy: Secondary | ICD-10-CM | POA: Diagnosis not present

## 2019-08-10 LAB — CBC
HCT: 36.6 % (ref 36.0–46.0)
Hemoglobin: 11.2 g/dL — ABNORMAL LOW (ref 12.0–15.0)
MCH: 27.1 pg (ref 26.0–34.0)
MCHC: 30.6 g/dL (ref 30.0–36.0)
MCV: 88.6 fL (ref 80.0–100.0)
Platelets: 314 10*3/uL (ref 150–400)
RBC: 4.13 MIL/uL (ref 3.87–5.11)
RDW: 13.8 % (ref 11.5–15.5)
WBC: 12.1 10*3/uL — ABNORMAL HIGH (ref 4.0–10.5)
nRBC: 0 % (ref 0.0–0.2)

## 2019-08-10 LAB — BASIC METABOLIC PANEL
Anion gap: 10 (ref 5–15)
BUN: 26 mg/dL — ABNORMAL HIGH (ref 8–23)
CO2: 29 mmol/L (ref 22–32)
Calcium: 9.2 mg/dL (ref 8.9–10.3)
Chloride: 99 mmol/L (ref 98–111)
Creatinine, Ser: 0.84 mg/dL (ref 0.44–1.00)
GFR calc Af Amer: >60 mL/min (ref >60)
GFR calc non Af Amer: >60 mL/min (ref >60)
Glucose, Bld: 158 mg/dL — ABNORMAL HIGH (ref 70–99)
Potassium: 4.1 mmol/L (ref 3.5–5.1)
Sodium: 138 mmol/L (ref 135–145)

## 2019-08-10 LAB — HEPATIC FUNCTION PANEL
ALT: 23 U/L (ref 0–44)
AST: 16 U/L (ref 15–41)
Albumin: 3.6 g/dL (ref 3.5–5.0)
Alkaline Phosphatase: 60 U/L (ref 38–126)
Bilirubin, Direct: 0.1 mg/dL (ref 0.0–0.2)
Indirect Bilirubin: 0.1 mg/dL — ABNORMAL LOW (ref 0.3–0.9)
Total Bilirubin: 0.2 mg/dL — ABNORMAL LOW (ref 0.3–1.2)
Total Protein: 7.2 g/dL (ref 6.5–8.1)

## 2019-08-10 LAB — TROPONIN I (HIGH SENSITIVITY): Troponin I (High Sensitivity): 4 ng/L (ref <18)

## 2019-08-10 LAB — D-DIMER, QUANTITATIVE: D-Dimer, Quant: 0.5 ug/mL-FEU (ref 0.00–0.50)

## 2019-08-10 LAB — BRAIN NATRIURETIC PEPTIDE: B Natriuretic Peptide: 73 pg/mL (ref 0.0–100.0)

## 2019-08-10 MED ORDER — SODIUM CHLORIDE 0.9% FLUSH: 3.0000 mL | Freq: Once | INTRAVENOUS | Status: DC

## 2019-08-10 NOTE — ED Notes (Signed)
Pt ambulated to restroom with monitor and spO2 applied. Pt sats going to restroom remained in 94-96% range. Pt denied any complaints at that point. Pt when ambulating back from the restroom remained between 94-96% however pt then noted increased shortness of breath and tightness in her chest. Pt sat back down on bed and stated that " walking that far made her very out of breath." RN notified of same.

## 2019-08-10 NOTE — ED Provider Notes (Signed)
[facility] DEPT Provider Note   CSN: [id number] Arrival date & time: 08/10/19  1639     History   Chief Complaint Chief Complaint  Patient presents with  . Chest Pain  . Shortness of Breath  . Leg Swelling  . Foot Swelling    HPI Samantha [name] is a 63 y.o. female.     HPI Patient with shortness of breath swelling in her legs.  Has had for around the last week.  Also with intermittent mid chest pain.  Has had nausea.  Feeling more fatigued.  Chest pain is dull.  Comes and goes.  Not associated with exertion.  States she gets short of breath if she lays flat at night.  States she has swelling in her legs.  States she still has left-sided abdominal pain and fullness in her abdomen.  Seen just under 2 weeks ago had a CT scan of the belly that was reassuring at that time.  No history of heart failure or known coronary artery disease.  No fevers or chills.  Has not weighed herself at home. Past Medical History:  Diagnosis Date  . Arthritis   . Dyslipidemia   . Heart murmur 2011  . Left bundle branch block 2011  . Type II or unspecified type diabetes mellitus without mention of complication, not stated as uncontrolled   . Unspecified essential hypertension     Patient Active Problem List   Diagnosis Date Noted  . GERD (gastroesophageal reflux disease) 10/24/2017  . Cough 08/14/2017  . Dyspnea 08/14/2017  . Pulmonary nodule, right 08/14/2017  . Chest pain 07/12/2017  . LBBB (left bundle branch block) 08/10/2016  . Leukocytosis 08/10/2016  . Hyperlipidemia 08/10/2016  . Essential hypertension 08/10/2016  . Pain in the chest 08/09/2016  . Diabetes ([place]) 11/20/2015    Past Surgical History:  Procedure Laterality Date  . TONSILLECTOMY  1962     OB History   No obstetric history on file.      Home Medications    Prior to Admission medications   Medication Sig Start Date End Date Taking? Authorizing Provider  aspirin 81 MG tablet  Take 81 mg by mouth every morning.    Yes [provider]  azelastine (ASTELIN) 0.1 % nasal spray Place 1 spray into both nostrils daily as needed for allergies.    Yes [provider]  budesonide-formoterol (SYMBICORT) 80-4.5 MCG/ACT inhaler Inhale 2 puffs into the lungs 2 (two) times daily. Patient taking differently: Inhale 2 puffs into the lungs 2 (two) times daily as needed (shortness of breath).  10/29/18  Yes Collene Gobble, MD  Calcium Citrate-Vitamin D (CALCIUM + D PO) Take 1 tablet by mouth every morning.    Yes [provider]  cetirizine (ZYRTEC) 10 MG tablet Take 10 mg by mouth daily.   Yes [provider]  diclofenac (VOLTAREN) 75 MG EC tablet Take 75 mg by mouth 2 (two) times daily. 07/08/19  Yes [provider]  FLUoxetine (PROZAC) 20 MG capsule Take 20 mg by mouth every morning.    Yes [provider]  gabapentin (NEURONTIN) 300 MG capsule Take 300 mg by mouth 2 (two) times daily.   Yes [provider]  glimepiride (AMARYL) 4 MG tablet Take 4 mg by mouth 2 (two) times daily. 07/26/19  Yes [provider]  hydrochlorothiazide (MICROZIDE) 12.5 MG capsule Take 12.5 mg by mouth daily. 05/20/19  Yes [provider]  Insulin Degludec (TRESIBA) 100 UNIT/ML SOLN Inject  10 Units into the skin at bedtime.    Yes [provider]  ipratropium-albuterol (DUONEB) 0.5-2.5 (3) MG/3ML SOLN Inhale 3 mLs into the lungs daily as needed (wheezing).  06/25/17  Yes [provider]  lovastatin (MEVACOR) 20 MG tablet Take 20 mg by mouth at bedtime.    Yes [provider]  metFORMIN (GLUCOPHAGE) 1000 MG tablet Take 1,000 mg by mouth 2 (two) times daily with a meal.   Yes [provider]  Omega-3 Fatty Acids (FISH OIL) 1000 MG CAPS Take 1,000 mg by mouth every morning.    Yes [provider]  pantoprazole (PROTONIX) 40 MG tablet Take 1 tablet (40 mg total) by mouth daily. Patient taking  differently: Take 40 mg by mouth at bedtime.  10/29/18  Yes Collene Gobble, MD  valsartan (DIOVAN) 160 MG tablet Take 1 tablet (160 mg total) by mouth daily. 10/29/18  Yes Collene Gobble, MD  verapamil (VERELAN PM) 240 MG 24 hr capsule Take 240 mg by mouth every morning.    Yes [provider]  benzonatate (TESSALON) 100 MG capsule Take 1 capsule (100 mg total) by mouth every 8 (eight) hours. Patient not taking: Reported on 08/10/2019 08/23/18   Wurst, [place], PA-C  cefdinir (OMNICEF) 300 MG capsule Take 1 capsule (300 mg total) by mouth 2 (two) times daily. Patient not taking: Reported on 08/10/2019 04/09/19   Vanessa Kick, MD  nitroGLYCERIN (NITROSTAT) 0.4 MG SL tablet Place 1 tablet (0.4 mg total) under the tongue every 5 (five) minutes as needed for chest pain. Patient not taking: Reported on 08/10/2019 08/10/16   Theodis Blaze, MD  ondansetron (ZOFRAN ODT) 4 MG disintegrating tablet 4mg  ODT q4 hours prn nausea/vomit Patient not taking: Reported on 08/10/2019 07/30/19   Mesner, Corene Cornea, MD    Family History Family History  Problem Relation Age of Onset  . Diabetes Mother   . Hypertension Mother   . CAD Mother   . Hypertension Father   . Diabetes Father   . CAD Father   . Diabetes Brother   . Hyperlipidemia Brother   . Hypertension Brother   . CAD Brother   . Diabetes Sister   . Hypertension Sister     Social History Social History   Tobacco Use  . Smoking status: Never Smoker  . Smokeless tobacco: Never Used  Substance Use Topics  . Alcohol use: No  . Drug use: No     Allergies   Iohexol   Review of Systems Review of Systems  Constitutional: Positive for fatigue. Negative for appetite change and fever.  HENT: Negative for congestion.   Respiratory: Positive for shortness of breath.   Cardiovascular: Positive for chest pain and leg swelling.  Gastrointestinal: Positive for abdominal pain. Negative for diarrhea, nausea and vomiting.  Genitourinary: Negative  for flank pain.  Musculoskeletal: Negative for back pain.  Skin: Negative for rash.  Neurological: Negative for weakness.     Physical Exam Updated Vital Signs BP (!) 146/66   Pulse (!) 59   Temp 97.8 F (36.6 C) (Oral)   Resp 19   Ht 5\' 5"  (1.651 m)   Wt 111.1 kg   SpO2 92%   BMI 40.77 kg/m   Physical Exam Vitals signs and nursing note reviewed.  HENT:     Head: Atraumatic.  Neck:     Musculoskeletal: Neck supple.  Cardiovascular:     Rate and Rhythm: Normal rate and regular rhythm.  Pulmonary:  Effort: Pulmonary effort is normal.  Chest:     Chest wall: No tenderness, crepitus or edema.  Abdominal:     Comments: Mild left-sided abdominal tenderness without rebound or guarding.  Musculoskeletal:     Right lower leg: Edema present.     Left lower leg: Edema present.     Comments: Pitting edema bilateral lower extremities.  Skin:    General: Skin is warm.     Capillary Refill: Capillary refill takes less than 2 seconds.  Neurological:     Mental Status: She is alert.      ED Treatments / Results  Labs (all labs ordered are listed, but only abnormal results are displayed) Labs Reviewed  BASIC METABOLIC PANEL - Abnormal; Notable for the following components:      Result Value   Glucose, Bld 158 (*)    BUN 26 (*)    All other components within normal limits  CBC - Abnormal; Notable for the following components:   WBC 12.1 (*)    Hemoglobin 11.2 (*)    All other components within normal limits  HEPATIC FUNCTION PANEL - Abnormal; Notable for the following components:   Total Bilirubin 0.2 (*)    Indirect Bilirubin 0.1 (*)    All other components within normal limits  SARS CORONAVIRUS 2 (HOSPITAL ORDER, [facility])  BRAIN NATRIURETIC PEPTIDE  D-DIMER, QUANTITATIVE (NOT AT [facility])  TROPONIN I (HIGH SENSITIVITY)    EKG EKG Interpretation  Date/Time:  Sunday August 10 2019 16:51:05 EDT Ventricular Rate:  62 PR Interval:   148 QRS Duration: 136 QT Interval:  436 QTC Calculation: 442 R Axis:   29 Text Interpretation:  Normal sinus rhythm Left bundle branch block Abnormal ECG Confirmed by Davonna Belling [phone]) on 08/10/2019 10:07:44 PM   Radiology Dg Chest 2 View  Result Date: 08/10/2019 CLINICAL DATA:  Patient with sternal chest pain. EXAM: CHEST - 2 VIEW COMPARISON:  Chest radiograph 07/12/2017 FINDINGS: Stable cardiac and mediastinal contours. No consolidative pulmonary opacities. No pleural effusion or pneumothorax. Thoracic spine degenerative changes. IMPRESSION: No acute cardiopulmonary process. Electronically Signed   By: Lovey Newcomer M.D.   On: 08/10/2019 17:49    Procedures Procedures (including critical care time)  Medications Ordered in ED Medications  sodium chloride flush (NS) 0.9 % injection 3 mL (has no administration in time range)     Initial Impression / Assessment and Plan / ED Course  I have reviewed the triage vital signs and the nursing notes.  Pertinent labs & imaging results that were available during my care of the patient were reviewed by me and considered in my medical decision making (see chart for details).        Patient presents with shortness of breath.  Has had for last week.  Swelling in her legs and abdomen.  Chest pain at times.  States she is been having difficulty laying flat.  X-ray reassuring.  BNP normal however.  EKG reassuring.  Has had 2 previous clean heart caths.  Has had previous negative CTA for pulmonary embolism but does have a dye allergy.  Attempted to ambulate patient.  Was not hypoxic but became dyspneic and had chest pain.  I feel the patient will likely require admission the hospital but feels that she needs for COVID test and d-dimer back.  If covid positive potentially could be discharged home. Care turned over to oncoming provider  Final Clinical Impressions(s) / ED Diagnoses   Final diagnoses:  Peripheral  edema  Dyspnea, unspecified type     ED Discharge Orders    None       Davonna Belling, MD 08/10/19 2323

## 2019-08-10 NOTE — ED Triage Notes (Addendum)
Patient c/o lower extremities edema, abdominal swelling SOB x 1 week. Patient c/o intermittent chest pain since 1100 today. Patient states the pain radiates into her mid back Patient states she has had intermittent nausea  > a week. Patient also added that she is lethargic and tired.

## 2019-08-11 LAB — SARS CORONAVIRUS 2 BY RT PCR (HOSPITAL ORDER, PERFORMED IN ~~LOC~~ HOSPITAL LAB): SARS Coronavirus 2: NEGATIVE

## 2019-08-11 NOTE — Discharge Instructions (Addendum)
Follow-up with cardiology later this week.  Called Dr. Elmarie Shiley office tomorrow morning to make these arrangements.  Return to the emergency department in the meantime if you develop severe chest pain, worsening breathing, high fever, or other new and concerning symptoms.

## 2019-08-11 NOTE — ED Provider Notes (Signed)
Care assumed from Dr. Alvino Chapel at shift change.  Patient is awaiting results of a COVID-19 test.  This has returned and is negative.    Patient reevaluated and is feeling better.  Laboratory studies reviewed and showed negative troponin and normal BNP.  Her chest x-ray is clear and d-dimer is negative.  I feel as though the emergent causes of her symptoms have been excluded and believe the patient to be appropriate for discharge.  Her vitals are stable with oxygen saturations in the upper 90s and heart rate of 60.  Will be advised to follow-up later this week with Dr. Acie Fredrickson, her cardiologist and is to return as needed if she worsens.   Veryl Speak, MD 08/11/19 [phone]

## 2019-09-10 ENCOUNTER — Telehealth: Payer: Self-pay

## 2019-09-10 NOTE — Telephone Encounter (Signed)
NOTES ON FILE FROM [facility]Y [facility] [phone], SENT REFERRAL TO SCHEDULING

## 2019-11-04 ENCOUNTER — Ambulatory Visit (INDEPENDENT_AMBULATORY_CARE_PROVIDER_SITE_OTHER): Payer: Medicare Other | Admitting: Cardiovascular Disease

## 2019-11-04 ENCOUNTER — Encounter: Payer: Self-pay | Admitting: Cardiovascular Disease

## 2019-11-04 ENCOUNTER — Other Ambulatory Visit: Payer: Self-pay

## 2019-11-04 VITALS — BP 158/76 | HR 70 | Ht 65.0 in | Wt 244.0 lb

## 2019-11-04 DIAGNOSIS — I447 Left bundle-branch block, unspecified: Secondary | ICD-10-CM

## 2019-11-04 DIAGNOSIS — I10 Essential (primary) hypertension: Secondary | ICD-10-CM

## 2019-11-04 NOTE — Progress Notes (Signed)
Cardiology Office Note:    Date:  11/04/2019   ID:  Eliberto Ivory, DOB 1956/06/03, MRN [id number]  PCP:  Gregor Hams, FNP  Cardiologist:  Regie Bunner  Electrophysiologist:  None   Referring MD: Gregor Hams, FNP   Chief Complaint  Patient presents with  . Shortness of Breath    History of Present Illness:    Samantha [name] is a 63 y.o. female with a hx of diabetes mellitus, obesity, hypertension, left bundle branch block.  We are asked to see her today by Tobie Lords, FNP  for further evaluation of her shortness of breath.  I have seen her in the past.  Has lots of GI issues after eating .  She has lots of pain and discomfort especially after eating.  She is quite active at work.  She is never had episodes of chest pain with walking around.  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  She does have some ankle swelling. Avoids salt.   Does not walk for exercise  We performed a heart cath in 2006 which was essentially normal.  I saw her back in 2017 for left bundle branch block.  Echocardiogram at that time revealed normal left ventricular function.  Has cp related to eating.       Past Medical History:  Diagnosis Date  . Arthritis   . Dyslipidemia   . Heart murmur 2011  . Left bundle branch block 2011  . Type II or unspecified type diabetes mellitus without mention of complication, not stated as uncontrolled   . Unspecified essential hypertension     Past Surgical History:  Procedure Laterality Date  . TONSILLECTOMY  1962    Current Medications: Current Meds  Medication Sig  . aspirin 81 MG tablet Take 81 mg by mouth every morning.   Marland Kitchen azelastine (ASTELIN) 0.1 % nasal spray Place 1 spray into both nostrils daily as needed for allergies.   . budesonide-formoterol (SYMBICORT) 80-4.5 MCG/ACT inhaler Inhale 2 puffs into the lungs 2 (two) times daily.  . Calcium Citrate-Vitamin D (CALCIUM + D PO) Take 1 tablet by mouth every morning.   .  cetirizine (ZYRTEC) 10 MG tablet Take 10 mg by mouth daily.  . diclofenac (VOLTAREN) 75 MG EC tablet Take 75 mg by mouth 2 (two) times daily.  Marland Kitchen FLUoxetine (PROZAC) 20 MG capsule Take 20 mg by mouth every morning.   . gabapentin (NEURONTIN) 300 MG capsule Take 300 mg by mouth 2 (two) times daily.  . hydrochlorothiazide (MICROZIDE) 12.5 MG capsule Take 12.5 mg by mouth daily.  . Insulin Degludec (TRESIBA) 100 UNIT/ML SOLN Inject 10 Units into the skin at bedtime.   Marland Kitchen ipratropium-albuterol (DUONEB) 0.5-2.5 (3) MG/3ML SOLN Inhale 3 mLs into the lungs daily as needed (wheezing).   Marland Kitchen lovastatin (MEVACOR) 20 MG tablet Take 20 mg by mouth at bedtime.   . metFORMIN (GLUCOPHAGE) 1000 MG tablet Take 1,000 mg by mouth 2 (two) times daily with a meal.  . nitroGLYCERIN (NITROSTAT) 0.4 MG SL tablet Place 1 tablet (0.4 mg total) under the tongue every 5 (five) minutes as needed for chest pain.  . Omega-3 Fatty Acids (FISH OIL) 1000 MG CAPS Take 1,000 mg by mouth every morning.   . ondansetron (ZOFRAN ODT) 4 MG disintegrating tablet 4mg  ODT q4 hours prn nausea/vomit  . pantoprazole (PROTONIX) 40 MG tablet Take 1 tablet (40 mg total) by mouth daily.  . valsartan (DIOVAN) 160 MG tablet Take 1 tablet (160 mg total) by  mouth daily.  . verapamil (VERELAN PM) 240 MG 24 hr capsule Take 240 mg by mouth every morning.      Allergies:   Iohexol   Social History   Socioeconomic History  . Marital status: Married    Spouse name: Not on file  . Number of children: Not on file  . Years of education: Not on file  . Highest education level: Not on file  Occupational History  . Not on file  Social Needs  . Financial resource strain: Not on file  . Food insecurity    Worry: Not on file    Inability: Not on file  . Transportation needs    Medical: Not on file    Non-medical: Not on file  Tobacco Use  . Smoking status: Never Smoker  . Smokeless tobacco: Never Used  Substance and Sexual Activity  . Alcohol use:  No  . Drug use: No  . Sexual activity: Not on file  Lifestyle  . Physical activity    Days per week: Not on file    Minutes per session: Not on file  . Stress: Not on file  Relationships  . Social Herbalist on phone: Not on file    Gets together: Not on file    Attends religious service: Not on file    Active member of club or organization: Not on file    Attends meetings of clubs or organizations: Not on file    Relationship status: Not on file  Other Topics Concern  . Not on file  Social History Narrative  . Not on file     Family History: The patient's family history includes CAD in her brother, father, and mother; Diabetes in her brother, father, mother, and sister; Hyperlipidemia in her brother; Hypertension in her brother, father, mother, and sister.  ROS:   Please see the history of present illness.     All other systems reviewed and are negative.  EKGs/Labs/Other Studies Reviewed:    The following studies were reviewed today:   EKG: November 04, 2019: Normal sinus rhythm at 70 beats a minute.  Left bundle branch block.  No changes from previous EKG.   Recent Labs: 08/10/2019: ALT 23; B Natriuretic Peptide 73.0; BUN 26; Creatinine, Ser 0.84; Hemoglobin 11.2; Platelets 314; Potassium 4.1; Sodium 138  Recent Lipid Panel    Component Value Date/Time   CHOL 137 06/02/2010 2104   TRIG 182 (H) 06/02/2010 2104   HDL 54 06/02/2010 2104   CHOLHDL 2.5 Ratio 06/02/2010 2104   VLDL 36 06/02/2010 2104   LDLCALC 47 06/02/2010 2104    Physical Exam:    VS:  BP (!) 158/76   Pulse 70   Ht 5\' 5"  (1.651 m)   Wt 244 lb (110.7 kg)   SpO2 96%   BMI 40.60 kg/m     Wt Readings from Last 3 Encounters:  11/04/19 244 lb (110.7 kg)  08/10/19 245 lb (111.1 kg)  10/29/18 233 lb (105.7 kg)     GEN:  Middle age, obese female, NAD Body mass index is 40.6 kg/m.   HEENT: Normal NECK: No JVD; No carotid bruits LYMPHATICS: No lymphadenopathy CARDIAC: RRR,  Soft  systolic murmur  RESPIRATORY:  Clear to auscultation without rales, wheezing or rhonchi  ABDOMEN: Soft, non-tender, non-distended MUSCULOSKELETAL:  No edema; No deformity  SKIN: Warm and dry NEUROLOGIC:  Alert and oriented x 3 PSYCHIATRIC:  Normal affect   ASSESSMENT:    1. Left bundle  branch block (LBBB)   2. Essential hypertension    PLAN:    In order of problems listed above:  1. Chest discomfort: Lucielle presents for further evaluation of some chest discomfort.  These episodes occur after she has eaten and do not occur with exercise.  She has a known elevated left hemidiaphragm and the x-ray from last month reveals a large gastric or colon gas collection that may have been contributing to her left-sided pain.  I think her pains are GI in origin.  I recommended that she see a gastroenterologist.  2.  Left bundle branch block: Patient has a chronic left bundle branch block.  Check an echocardiogram to assess her left ventricular function and her soft systolic murmur.  She does have history of mild leg edema that typically gets better overnight.   Medication Adjustments/Labs and Tests Ordered: Current medicines are reviewed at length with the patient today.  Concerns regarding medicines are outlined above.  Orders Placed This Encounter  Procedures  . EKG 12-Lead  . ECHOCARDIOGRAM COMPLETE   No orders of the defined types were placed in this encounter.   Patient Instructions  Medication Instructions:  Your physician recommends that you continue on your current medications as directed. Please refer to the Current Medication list given to you today.  *If you need a refill on your cardiac medications before your next appointment, please call your pharmacy*  Lab Work: None Ordered    Testing/Procedures: Your physician has requested that you have an echocardiogram. Echocardiography is a painless test that uses sound waves to create images of your heart. It provides your  doctor with information about the size and shape of your heart and how well your heart's chambers and valves are working. This procedure takes approximately one hour. There are no restrictions for this procedure.    Follow-Up: At [facility], you and your health needs are our priority.  As part of our continuing mission to provide you with exceptional heart care, we have created designated Provider Care Teams.  These Care Teams include your primary Cardiologist (physician) and Advanced Practice Providers (APPs -  Physician Assistants and Nurse Practitioners) who all work together to provide you with the care you need, when you need it.  Your next appointment:   1 year(s)  The format for your next appointment:   In Person  Provider:   Richardson Dopp, PA-C, Robbie Lis, PA-C or Daune Perch, NP      Signed, Mertie Moores, MD  11/04/2019 10:33 AM    [facility]

## 2019-11-04 NOTE — Patient Instructions (Signed)
Medication Instructions:  Your physician recommends that you continue on your current medications as directed. Please refer to the Current Medication list given to you today.  *If you need a refill on your cardiac medications before your next appointment, please call your pharmacy*  Lab Work: None Ordered    Testing/Procedures: Your physician has requested that you have an echocardiogram. Echocardiography is a painless test that uses sound waves to create images of your heart. It provides your doctor with information about the size and shape of your heart and how well your heart's chambers and valves are working. This procedure takes approximately one hour. There are no restrictions for this procedure.    Follow-Up: At [facility], you and your health needs are our priority.  As part of our continuing mission to provide you with exceptional heart care, we have created designated Provider Care Teams.  These Care Teams include your primary Cardiologist (physician) and Advanced Practice Providers (APPs -  Physician Assistants and Nurse Practitioners) who all work together to provide you with the care you need, when you need it.  Your next appointment:   1 year(s)  The format for your next appointment:   In Person  Provider:   Richardson Dopp, PA-C, Robbie Lis, PA-C or Daune Perch, NP

## 2019-11-14 ENCOUNTER — Other Ambulatory Visit (HOSPITAL_COMMUNITY): Payer: Medicare Other

## 2019-11-18 ENCOUNTER — Ambulatory Visit (HOSPITAL_COMMUNITY): Payer: Medicare Other | Attending: Cardiovascular Disease

## 2019-11-18 ENCOUNTER — Other Ambulatory Visit: Payer: Self-pay

## 2019-11-18 ENCOUNTER — Encounter (INDEPENDENT_AMBULATORY_CARE_PROVIDER_SITE_OTHER): Payer: Self-pay

## 2019-11-18 DIAGNOSIS — I447 Left bundle-branch block, unspecified: Secondary | ICD-10-CM | POA: Diagnosis not present

## 2019-12-04 DIAGNOSIS — Z86010 Personal history of colonic polyps: Secondary | ICD-10-CM | POA: Diagnosis not present

## 2019-12-04 DIAGNOSIS — K5730 Diverticulosis of large intestine without perforation or abscess without bleeding: Secondary | ICD-10-CM | POA: Diagnosis not present

## 2019-12-05 DIAGNOSIS — I10 Essential (primary) hypertension: Secondary | ICD-10-CM | POA: Diagnosis not present

## 2019-12-05 DIAGNOSIS — E669 Obesity, unspecified: Secondary | ICD-10-CM | POA: Diagnosis not present

## 2019-12-05 DIAGNOSIS — Z794 Long term (current) use of insulin: Secondary | ICD-10-CM | POA: Diagnosis not present

## 2019-12-05 DIAGNOSIS — E119 Type 2 diabetes mellitus without complications: Secondary | ICD-10-CM | POA: Diagnosis not present

## 2019-12-05 DIAGNOSIS — E1159 Type 2 diabetes mellitus with other circulatory complications: Secondary | ICD-10-CM | POA: Diagnosis not present

## 2020-01-28 DIAGNOSIS — D3131 Benign neoplasm of right choroid: Secondary | ICD-10-CM | POA: Diagnosis not present

## 2020-02-23 ENCOUNTER — Ambulatory Visit (INDEPENDENT_AMBULATORY_CARE_PROVIDER_SITE_OTHER): Payer: Medicare HMO | Admitting: Family Medicine

## 2020-02-23 ENCOUNTER — Encounter: Payer: Self-pay | Admitting: Family Medicine

## 2020-02-23 ENCOUNTER — Other Ambulatory Visit: Payer: Self-pay

## 2020-02-23 VITALS — BP 169/79 | HR 68 | Temp 97.6°F | Resp 14 | Ht 70.0 in | Wt 244.0 lb

## 2020-02-23 DIAGNOSIS — E1129 Type 2 diabetes mellitus with other diabetic kidney complication: Secondary | ICD-10-CM | POA: Diagnosis not present

## 2020-02-23 DIAGNOSIS — E1159 Type 2 diabetes mellitus with other circulatory complications: Secondary | ICD-10-CM

## 2020-02-23 DIAGNOSIS — F39 Unspecified mood [affective] disorder: Secondary | ICD-10-CM | POA: Insufficient documentation

## 2020-02-23 DIAGNOSIS — I447 Left bundle-branch block, unspecified: Secondary | ICD-10-CM

## 2020-02-23 DIAGNOSIS — E1169 Type 2 diabetes mellitus with other specified complication: Secondary | ICD-10-CM | POA: Diagnosis not present

## 2020-02-23 DIAGNOSIS — Z83438 Family history of other disorder of lipoprotein metabolism and other lipidemia: Secondary | ICD-10-CM | POA: Insufficient documentation

## 2020-02-23 DIAGNOSIS — F411 Generalized anxiety disorder: Secondary | ICD-10-CM | POA: Insufficient documentation

## 2020-02-23 DIAGNOSIS — Z7689 Persons encountering health services in other specified circumstances: Secondary | ICD-10-CM | POA: Diagnosis not present

## 2020-02-23 DIAGNOSIS — I152 Hypertension secondary to endocrine disorders: Secondary | ICD-10-CM

## 2020-02-23 DIAGNOSIS — R809 Proteinuria, unspecified: Secondary | ICD-10-CM

## 2020-02-23 DIAGNOSIS — I10 Essential (primary) hypertension: Secondary | ICD-10-CM

## 2020-02-23 DIAGNOSIS — E538 Deficiency of other specified B group vitamins: Secondary | ICD-10-CM | POA: Insufficient documentation

## 2020-02-23 DIAGNOSIS — E559 Vitamin D deficiency, unspecified: Secondary | ICD-10-CM | POA: Insufficient documentation

## 2020-02-23 DIAGNOSIS — Z8249 Family history of ischemic heart disease and other diseases of the circulatory system: Secondary | ICD-10-CM | POA: Insufficient documentation

## 2020-02-23 DIAGNOSIS — Z833 Family history of diabetes mellitus: Secondary | ICD-10-CM | POA: Insufficient documentation

## 2020-02-23 DIAGNOSIS — E785 Hyperlipidemia, unspecified: Secondary | ICD-10-CM | POA: Insufficient documentation

## 2020-02-23 DIAGNOSIS — Z800 Family history of malignant neoplasm of digestive organs: Secondary | ICD-10-CM | POA: Insufficient documentation

## 2020-02-23 LAB — POCT UA - MICROALBUMIN
Creatinine, POC: 300 mg/dL
Microalbumin Ur, POC: 80 mg/L

## 2020-02-23 LAB — POCT GLYCOSYLATED HEMOGLOBIN (HGB A1C): Hemoglobin A1C: 7.3 % — AB (ref 4.0–5.6)

## 2020-02-23 MED ORDER — OZEMPIC (1 MG/DOSE) 2 MG/1.5ML ~~LOC~~ SOPN: 1.0000 mg | PEN_INJECTOR | SUBCUTANEOUS | 0 refills | Status: DC

## 2020-02-23 MED ORDER — OZEMPIC (0.25 OR 0.5 MG/DOSE) 2 MG/1.5ML ~~LOC~~ SOPN: PEN_INJECTOR | SUBCUTANEOUS | 0 refills | Status: DC

## 2020-02-23 NOTE — Patient Instructions (Addendum)
When you start the Ozempic, please decrease your insulin dosage by 4 units. With each increase in the Ozempic, please decrease your insulin by 4 units.   Diabetes Mellitus and Standards of Medical Care  Managing diabetes (diabetes mellitus) can be complicated. Your diabetes treatment may be managed by a team of health care providers, including:  A diet and nutrition specialist (registered dietitian).  A nurse.  A certified diabetes educator (CDE).  A diabetes specialist (endocrinologist).  An eye doctor.  A primary care provider.  A dentist.  Your health care providers follow a schedule in order to help you get the best quality of care. The following schedule is a general guideline for your diabetes management plan. Your health care providers may also give you more specific instructions.  HbA1c (hemoglobin A1c) test This test provides information about blood sugar (glucose) control over the previous 2-3 months. It is used to check whether your diabetes management plan needs to be adjusted.  If you are meeting your treatment goals, this test is done at least 2 times a year.  If you are not meeting treatment goals or if your treatment goals have changed, this test is done 4 times a year.  Blood pressure test  This test is done at every routine medical visit. For most people, the goal is less than 130/80. Ask your health care provider what your goal blood pressure should be.  Dental and eye exams  Visit your dentist two times a year.  If you have type 1 diabetes, get an eye exam 3-5 years after you are diagnosed, and then once a year after your first exam. ? If you were diagnosed with type 1 diabetes as a child, get an eye exam when you are age 15 or older and have had diabetes for 3-5 years. After the first exam, you should get an eye exam once a year.  If you have type 2 diabetes, have an eye exam as soon as you are diagnosed, and then once a year after your first  exam.  Foot care exam  Visual foot exams are done at every routine medical visit. The exams check for cuts, bruises, redness, blisters, sores, or other problems with the feet.  A complete foot exam is done by your health care provider once a year. This exam includes an inspection of the structure and skin of your feet, and a check of the pulses and sensation in your feet. ? Type 1 diabetes: Get your first exam 3-5 years after diagnosis. ? Type 2 diabetes: Get your first exam as soon as you are diagnosed.  Check your feet every day for cuts, bruises, redness, blisters, or sores. If you have any of these or other problems that are not healing, contact your health care provider.  Kidney function test (urine microalbumin)  This test is done once a year. ? Type 1 diabetes: Get your first test 5 years after diagnosis. ? Type 2 diabetes: Get your first test as soon as you are diagnosed._  If you have chronic kidney disease (CKD), get a serum creatinine and estimated glomerular filtration rate (eGFR) test once a year.  Lipid profile (cholesterol, HDL, LDL, triglycerides)  This test should be done when you are diagnosed with diabetes, and every 5 years after the first test. If you are on medicines to lower your cholesterol, you may need to get this test done every year. ? The goal for LDL is less than 100 mg/dL (5.5 mmol/L). If you are  at high risk, the goal is less than 70 mg/dL (3.9 mmol/L). ? The goal for HDL is 40 mg/dL (2.2 mmol/L) for men and 50 mg/dL(2.8 mmol/L) for women. An HDL cholesterol of 60 mg/dL (3.3 mmol/L) or higher gives some protection against heart disease. ? The goal for triglycerides is less than 150 mg/dL (8.3 mmol/L).  Immunizations  The yearly flu (influenza) vaccine is recommended for everyone 6 months or older who has diabetes.  The pneumonia (pneumococcal) vaccine is recommended for everyone 2 years or older who has diabetes. If you are 67 or older, you may get the  pneumonia vaccine as a series of two separate shots.  The hepatitis B vaccine is recommended for adults shortly after they have been diagnosed with diabetes.  The Tdap (tetanus, diphtheria, and pertussis) vaccine should be given: ? According to normal childhood vaccination schedules, for children. ? Every 10 years, for adults who have diabetes.  The shingles vaccine is recommended for people who have had chicken pox and are 50 years or older.  Mental and emotional health  Screening for symptoms of eating disorders, anxiety, and depression is recommended at the time of diagnosis and afterward as needed. If your screening shows that you have symptoms (you have a positive screening result), you may need further evaluation and be referred to a mental health care provider.  Diabetes self-management education  Education about how to manage your diabetes is recommended at diagnosis and ongoing as needed.  Treatment plan  Your treatment plan will be reviewed at every medical visit.  Summary  Managing diabetes (diabetes mellitus) can be complicated. Your diabetes treatment may be managed by a team of health care providers.  Your health care providers follow a schedule in order to help you get the best quality of care.  Standards of care including having regular physical exams, blood tests, blood pressure monitoring, immunizations, screening tests, and education about how to manage your diabetes.  Your health care providers may also give you more specific instructions based on your individual health.      Type 2 Diabetes Mellitus, Self Care, Adult Caring for yourself after you have been diagnosed with type 2 diabetes (type 2 diabetes mellitus) means keeping your blood sugar (glucose) under control with a balance of:  Nutrition.  Exercise.  Lifestyle changes.  Medicines or insulin, if necessary.  Support from your team of health care providers and others.  The following  information explains what you need to know to manage your diabetes at home. What do I need to do to manage my blood glucose?  Check your blood glucose every day, as often as told by your health care provider.  Contact your health care provider if your blood glucose is above your target for 2 tests in a row.  Have your A1c (hemoglobin A1c) level checked at least two times a year, or as often as told by your health care provider. Your health care provider will set individualized treatment goals for you. Generally, the goal of treatment is to maintain the following blood glucose levels:  Before meals (preprandial): 80-130 mg/dL (4.4-7.2 mmol/L).  After meals (postprandial): below 180 mg/dL (10 mmol/L).  A1c level: less than 7%.  What do I need to know about hyperglycemia and hypoglycemia? What is hyperglycemia? Hyperglycemia, also called high blood glucose, occurs when blood glucose is too high.Make sure you know the early signs of hyperglycemia, such as:  Increased thirst.  Hunger.  Feeling very tired.  Needing to urinate more  often than usual.  Blurry vision.  What is hypoglycemia? Hypoglycemia, also called low blood glucose, occurswith a blood glucose level at or below 70 mg/dL (3.9 mmol/L). The risk for hypoglycemia increases during or after exercise, during sleep, during illness, and when skipping meals or not eating for a long time (fasting). It is important to know the symptoms of hypoglycemia and treat it right away. Always have a 15-gram rapid-acting carbohydrate snack with you to treat low blood glucose. Family members and close friends should also know the symptoms and should understand how to treat hypoglycemia, in case you are not able to treat yourself. What are the symptoms of hypoglycemia? Hypoglycemia symptoms can include:  Hunger.  Anxiety.  Sweating and feeling clammy.  Confusion.  Dizziness or feeling light-headed.  Sleepiness.  Nausea.  Increased  heart rate.  Headache.  Blurry vision.  Seizure.  Nightmares.  Tingling or numbness around the mouth, lips, or tongue.  A change in speech.  Decreased ability to concentrate.  A change in coordination.  Restless sleep.  Tremors or shakes.  Fainting.  Irritability.  How do I treat hypoglycemia?  If you are alert and able to swallow safely, follow the 15:15 rule:  Take 15 grams of a rapid-acting carbohydrate. Rapid-acting options include: ? 1 tube of glucose gel. ? 3 glucose pills. ? 6-8 pieces of hard candy. ? 4 oz (120 mL) of fruit juice. ? 4 oz (120 mL) of regular (not diet) soda.  Check your blood glucose 15 minutes after you take the carbohydrate.  If the repeat blood glucose level is still at or below 70 mg/dL (3.9 mmol/L), take 15 grams of a carbohydrate again.  If your blood glucose level does not increase above 70 mg/dL (3.9 mmol/L) after 3 tries, seek emergency medical care.  After your blood glucose level returns to normal, eat a meal or a snack within 1 hour.  How do I treat severe hypoglycemia? Severe hypoglycemia is when your blood glucose level is at or below 54 mg/dL (3 mmol/L). Severe hypoglycemia is an emergency. Do not wait to see if the symptoms will go away. Get medical help right away. Call your local emergency services (911 in the [place] Do not drive yourself to the hospital. If you have severe hypoglycemia and you cannot eat or drink, you may need an injection of glucagon. A family member or close friend should learn how to check your blood glucose and how to give you a glucagon injection. Ask your health care provider if you need to have an emergency glucagon injection kit available. Severe hypoglycemia may need to be treated in a hospital. The treatment may include getting glucose through an IV tube. You may also need treatment for the cause of your hypoglycemia. Can having diabetes put me at risk for other conditions? Having diabetes can put  you at risk for other long-term (chronic) conditions, such as heart disease and kidney disease. Your health care provider may prescribe medicines to help prevent complications from diabetes. These medicines may include:  Aspirin.  Medicine to lower cholesterol.  Medicine to control blood pressure.  What else can I do to manage my diabetes? Take your diabetes medicines as told  If your health care provider prescribed insulin or diabetes medicines, take them every day.  Do not run out of insulin or other diabetes medicines that you take. Plan ahead so you always have these available.  If you use insulin, adjust your dosage based on how physically active you  are and what foods you eat. Your health care provider will tell you how to adjust your dosage. Make healthy food choices  The things that you eat and drink affect your blood glucose and your insulin dosage. Making good choices helps to control your diabetes and prevent other health problems. A healthy meal plan includes eating lean proteins, complex carbohydrates, fresh fruits and vegetables, low-fat dairy products, and healthy fats. Make an appointment to see a diet and nutrition specialist (registered dietitian) to help you create an eating plan that is right for you. Make sure that you:  Follow instructions from your health care provider about eating or drinking restrictions.  Drink enough fluid to keep your urine clear or pale yellow.  Eat healthy snacks between nutritious meals.  Track the carbohydrates that you eat. Do this by reading food labels and learning the standard serving sizes of foods.  Follow your sick day plan whenever you cannot eat or drink as usual. Make this plan in advance with your health care provider.  Stay active  Exercise regularly, as told by your health care provider. This may include:  Stretching and doing strength exercises, such as yoga or weightlifting, at least 2 times a week.  Doing at least  150 minutes of moderate-intensity or vigorous-intensity exercise each week. This could be brisk walking, biking, or water aerobics. ? Spread out your activity over at least 3 days of the week. ? Do not go more than 2 days in a row without doing some kind of physical activity.  When you start a new exercise or activity, work with your health care provider to adjust your insulin, medicines, or food intake as needed. Make healthy lifestyle choices  Do not use any tobacco products, such as cigarettes, chewing tobacco, and e-cigarettes. If you need help quitting, ask your health care provider.  If your health care provider says that alcohol is safe for you, limit alcohol intake to no more than 1 drink per day for nonpregnant women and 2 drinks per day for men. One drink equals 12 oz of beer, 5 oz of wine, or 1 oz of hard liquor.  Learn to manage stress. If you need help with this, ask your health care provider. Care for your body   Keep your immunizations up to date. In addition to getting vaccinations as told by your health care provider, it is recommended that you get vaccinated against the following illnesses: ? The flu (influenza). Get a flu shot every year. ? Pneumonia. ? Hepatitis B.  Schedule an eye exam soon after your diagnosis, and then one time every year after that.  Check your skin and feet every day for cuts, bruises, redness, blisters, or sores. Schedule a foot exam with your health care provider once every year.  Brush your teeth and gums two times a day, and floss at least one time a day. Visit your dentist at least once every 6 months.  Maintain a healthy weight. General instructions  Take over-the-counter and prescription medicines only as told by your health care provider.  Share your diabetes management plan with people in your workplace, school, and household.  Check your urine for ketones when you are ill and as told by your health care provider.  Ask your health  care provider: ? Do I need to meet with a diabetes educator? ? Where can I find a support group for people with diabetes?  Carry a medical alert card or wear medical alert jewelry.  Keep all  follow-up visits as told by your health care provider. This is important. Where to find more information: For more information about diabetes, visit:  American Diabetes Association (ADA): www.diabetes.org  American Association of Diabetes Educators (AADE): www.diabeteseducator.org/patient-resources  This information is not intended to replace advice given to you by your health care provider. Make sure you discuss any questions you have with your health care provider. Document Released: 03/06/2016 Document Revised: 04/20/2016 Document Reviewed: 12/17/2015 Elsevier Interactive Patient Education  2017 [place].      Blood Glucose Monitoring, Adult Monitoring your blood sugar (glucose) helps you manage your diabetes. It also helps you and your health care provider determine how well your diabetes management plan is working. Blood glucose monitoring involves checking your blood glucose as often as directed, and keeping a record (log) of your results over time. Why should I monitor my blood glucose? Checking your blood glucose regularly can:  Help you understand how food, exercise, illnesses, and medicines affect your blood glucose.  Let you know what your blood glucose is at any time. You can quickly tell if you are having low blood glucose (hypoglycemia) or high blood glucose (hyperglycemia).  Help you and your health care provider adjust your medicines as needed.  When should I check my blood glucose? Follow instructions from your health care provider about how often to check your blood glucose.   This may depend on:  The type of diabetes you have.  How well-controlled your diabetes is.  Medicines you are taking.  If you have type 1 diabetes:  Check your blood glucose at least 2  times a day.  Also check your blood glucose: ? Before every insulin injection. ? Before and after exercise. ? Between meals. ? 2 hours after a meal. ? Occasionally between 2:00 a.m. and 3:00 a.m., as directed. ? Before potentially dangerous tasks, like driving or using heavy machinery. ? At bedtime.  You may need to check your blood glucose more often, up to 6-10 times a day: ? If you use an insulin pump. ? If you need multiple daily injections (MDI). ? If your diabetes is not well-controlled. ? If you are ill. ? If you have a history of severe hypoglycemia. ? If you have a history of not knowing when your blood glucose is getting low (hypoglycemia unawareness).  If you have type 2 diabetes:  If you take insulin or other diabetes medicines, check your blood glucose at least 2 times a day.  If you are on intensive insulin therapy, check your blood glucose at least 4 times a day. Occasionally, you may also need to check between 2:00 a.m. and 3:00 a.m., as directed.  Also check your blood glucose: ? Before and after exercise. ? Before potentially dangerous tasks, like driving or using heavy machinery.  You may need to check your blood glucose more often if: ? Your medicine is being adjusted. ? Your diabetes is not well-controlled. ? You are ill.  What is a blood glucose log?  A blood glucose log is a record of your blood glucose readings. It helps you and your health care provider: ? Look for patterns in your blood glucose over time. ? Adjust your diabetes management plan as needed.  Every time you check your blood glucose, write down your result and notes about things that may be affecting your blood glucose, such as your diet and exercise for the day.  Most glucose meters store a record of glucose readings in the meter. Some meters  allow you to download your records to a computer. How do I check my blood glucose? Follow these steps to get accurate readings of your blood  glucose: Supplies needed   Blood glucose meter.  Test strips for your meter. Each meter has its own strips. You must use the strips that come with your meter.  A needle to prick your finger (lancet). Do not use lancets more than once.  A device that holds the lancet (lancing device).  A journal or log book to write down your results.  Procedure  Wash your hands with soap and water.  Prick the side of your finger (not the tip) with the lancet. Use a different finger each time.  Gently rub the finger until a small drop of blood appears.  Follow instructions that come with your meter for inserting the test strip, applying blood to the strip, and using your blood glucose meter.  Write down your result and any notes.  Alternative testing sites  Some meters allow you to use areas of your body other than your finger (alternative sites) to test your blood.  If you think you may have hypoglycemia, or if you have hypoglycemia unawareness, do not use alternative sites. Use your finger instead.  Alternative sites may not be as accurate as the fingers, because blood flow is slower in these areas. This means that the result you get may be delayed, and it may be different from the result that you would get from your finger.  The most common alternative sites are: ? Forearm. ? Thigh. ? Palm of the hand.  Additional tips  Always keep your supplies with you.  If you have questions or need help, all blood glucose meters have a 24-hour "hotline" number that you can call. You may also contact your health care provider.  After you use a few boxes of test strips, adjust (calibrate) your blood glucose meter by following instructions that came with your meter.    The American Diabetes Association suggests the following targets for most nonpregnant adults with diabetes.  More or less stringent glycemic goals may be appropriate for each individual.  A1C: Less than 7% A1C may also be reported  as eAG: Less than 154 mg/dl Before a meal (preprandial plasma glucose): 80-130 mg/dl 1-2 hours after beginning of the meal (Postprandial plasma glucose)*: Less than 180 mg/dl  *Postprandial glucose may be targeted if A1C goals are not met despite reaching preprandial glucose goals.   GOALS in short:  The goals are for the Hgb A1C to be less than 7.0 & blood pressure to be less than 130/80.    It is recommended that all diabetics are educated on and follow a healthy diabetic diet, exercise for 30 minutes 3-4 times per week (walking, biking, swimming, or machine), monitor blood glucose readings and bring that record with you to be reviewed at your next office visit.     You should be checking fasting blood sugars- especially after you eat poorly or eat really healthy, and also check 2 hour postprandial blood sugars after largest meal of the day.    Write these down and bring in your log at each office visit.    You will need to be seen every 3 months by the provider managing your Diabetes unless told otherwise by that provider.   You will need yearly eye exams from an eye specialist and foot exams to check the nerves of your feet.  Also, your urine should be checked yearly  as well to make sure excess protein is not present.   If you are checking your blood pressure at home, please record it and bring it to your next office visit.    Follow the Dietary Approaches to Stop Hypertension (DASH) diet (3 servings of fruit and vegetables daily, whole grains, low sodium, low-fat proteins).  See below.    Lastly, when it comes to your cholesterol, the goal is to have the HDL (good cholesterol) >40, and the LDL (bad cholesterol) <100.   It is recommended that you follow a heart healthy, low saturated and trans-fat diet and exercise for 30 minutes at least 5 times a week.     (( Check out the DASH diet = 1.5 Gram Low Sodium Diet   A 1.5 gram sodium diet restricts the amount of sodium in the diet to no  more than 1.5 g or 1500 mg daily.  The American Heart Association recommends Americans over the age of 68 to consume no more than 1500 mg of sodium each day to reduce the risk of developing high blood pressure.  Research also shows that limiting sodium may reduce heart attack and stroke risk.  Many foods contain sodium for flavor and sometimes as a preservative.  When the amount of sodium in a diet needs to be low, it is important to know what to look for when choosing foods and drinks.  The following includes some information and guidelines to help make it easier for you to adapt to a low sodium diet.    QUICK TIPS  Do not add salt to food.  Avoid convenience items and fast food.  Choose unsalted snack foods.  Buy lower sodium products, often labeled as "lower sodium" or "no salt added."  Check food labels to learn how much sodium is in 1 serving.  When eating at a restaurant, ask that your food be prepared with less salt or none, if possible.    READING FOOD LABELS FOR SODIUM INFORMATION  The nutrition facts label is a good place to find how much sodium is in foods. Look for products with no more than 400 mg of sodium per serving.  Remember that 1.5 g = 1500 mg.  The food label may also list foods as:  Sodium-free: Less than 5 mg in a serving.  Very low sodium: 35 mg or less in a serving.  Low-sodium: 140 mg or less in a serving.  Light in sodium: 50% less sodium in a serving. For example, if a food that usually has 300 mg of sodium is changed to become light in sodium, it will have 150 mg of sodium.  Reduced sodium: 25% less sodium in a serving. For example, if a food that usually has 400 mg of sodium is changed to reduced sodium, it will have 300 mg of sodium.    CHOOSING FOODS  Grains  Avoid: Salted crackers and snack items. Some cereals, including instant hot cereals. Bread stuffing and biscuit mixes. Seasoned rice or pasta mixes.  Choose: Unsalted snack items. Low-sodium cereals,  oats, puffed wheat and rice, shredded wheat. English muffins and bread. Pasta.  Meats  Avoid: Salted, canned, smoked, spiced, pickled meats, including fish and poultry. Bacon, ham, sausage, cold cuts, hot dogs, anchovies.  Choose: Low-sodium canned tuna and salmon. Fresh or frozen meat, poultry, and fish.  Dairy  Avoid: Processed cheese and spreads. Cottage cheese. Buttermilk and condensed milk. Regular cheese.  Choose: Milk. Low-sodium cottage cheese. Yogurt. Sour cream. Low-sodium cheese.  Fruits and Vegetables  Avoid: Regular canned vegetables. Regular canned tomato sauce and paste. Frozen vegetables in sauces. Olives. Angie Fava. Relishes. Sauerkraut.  Choose: Low-sodium canned vegetables. Low-sodium tomato sauce and paste. Frozen or fresh vegetables. Fresh and frozen fruit.  Condiments  Avoid: Canned and packaged gravies. Worcestershire sauce. Tartar sauce. Barbecue sauce. Soy sauce. Steak sauce. Ketchup. Onion, garlic, and table salt. Meat flavorings and tenderizers.  Choose: Fresh and dried herbs and spices. Low-sodium varieties of mustard and ketchup. Lemon juice. Tabasco sauce. Horseradish.    SAMPLE 1.5 GRAM SODIUM MEAL PLAN:   Breakfast / Sodium (mg)  1 cup low-fat milk / 143 mg  1 whole-wheat English muffin / 240 mg  1 tbs heart-healthy margarine / 153 mg  1 hard-boiled egg / 139 mg  1 small orange / 0 mg  Lunch / Sodium (mg)  1 cup raw carrots / 76 mg  2 tbs no salt added peanut butter / 5 mg  2 slices whole-wheat bread / 270 mg  1 tbs jelly / 6 mg   cup red grapes / 2 mg  Dinner / Sodium (mg)  1 cup whole-wheat pasta / 2 mg  1 cup low-sodium tomato sauce / 73 mg  3 oz lean ground beef / 57 mg  1 small side salad (1 cup raw spinach leaves,  cup cucumber,  cup yellow bell pepper) with 1 tsp olive oil and 1 tsp red wine vinegar / 25 mg  Snack / Sodium (mg)  1 container low-fat vanilla yogurt / 107 mg  3 graham cracker squares / 127 mg  Nutrient Analysis  Calories: 1745   Protein: 75 g  Carbohydrate: 237 g  Fat: 57 g  Sodium: 1425 mg  Document Released: 11/13/2005 Document Revised: 07/26/2011 Document Reviewed: 02/14/2010  ExitCare Patient Information 2012 [place].))    This information is not intended to replace advice given to you by your health care provider. Make sure you discuss any questions you have with your health care provider. Document Released: 11/16/2003 Document Revised: 06/02/2016 Document Reviewed: 04/24/2016 Elsevier Interactive Patient Education  2017 [employer].

## 2020-02-23 NOTE — Progress Notes (Signed)
New patient office visit note:  Impression and Recommendations:    1. Encounter to establish care with new doctor   2. Type 2 diabetes mellitus with other specified complication, without long-term current use of insulin ([place])   3. Type 2 diabetes mellitus with morbid obesity ([place])   4. Hypertension associated with diabetes ([place])   5. Hyperlipidemia associated with type 2 diabetes mellitus ([place])   6. Microalbuminuria due to type 2 diabetes mellitus ([place])   7. Mood disorder ([place])-  Depression >er GAD Active  8. LBBB (left bundle branch block)   9. B12 deficiency   10. Vitamin D deficiency   11. Family history of heart disease   12. Family history of diabetes mellitus (DM)   13. Family history of hypertension   14. Family history of mixed hyperlipidemia   15. Family history of colon cancer-grandmother      - Last seen on 12/05/2019 by last PCP- Tobie Lords, NP at [facility] in [place].   Encounter to Establish Care with New Doctor - Extensive discussion held with patient regarding establishing as a new patient.  Discussed policies and practices here at the clinic, and answered all questions about care team and health management during appointment.  - Discussed need for patient to continue to obtain management and screenings with all established specialists.  Educated patient at length about the critical importance of keeping health maintenance up to date.  - Advised patient to return for Medicare Wellness and full fasting lab work same day yearly.  - Participated in lengthy conversation and all questions were answered.    Diabetes Mellitus - A1c today 7.3, improved from January of 2021 when it was 7.8, elevated. - Reviewed patient's historical A1c values of 7.5, 8.3, 8.2.  - Discussed goal A1c of less than 7.0.  - Reviewed patient's treatment plan during appointment today. - Discussed options for modification of patient's management today.  - Patient reports history of  rash/ intolerant S-E while taking Jardiance-SGLT-2 inhibitor.  - Management adjusted today.   - Discontinue glimepiride today.  - Reduce nightly Tresiba dose to 12 from 16 prior.  - Begin VERY low dose Ozempic.  See med list.  - Advised patient to decrease her insulin dosage by 4 units when she starts Ozempic. - With each increase in Ozempic dosage, patient will decrease her insulin by 4 units.  - Patient knows to monitor herself for low blood sugars and have emergency glucose available.  - Patient will send a MyChart message in 2-4 weeks including a list of her blood sugars on modified management.  - Counseled patient on pathophysiology of disease and discussed various treatment options, which always includes dietary and lifestyle modification as first line.    - Importance of low carb, heart-healthy diet discussed with patient in addition to regular aerobic exercise of 50mn 5d/week or more.   - Check FBS and 2 hours after the biggest meal of your day.  Keep log and bring in next OV for my review.    - Also told patient if you ever feel poorly, please check your blood pressure and blood sugar, as one or the other could be the cause of your symptoms.  - Pt reminded about need for yearly eye and foot exams.  Told patient to make appt.for diabetic eye exam, CMAs here will do foot exams  - Handouts provided at patient's desire and or told to go online at the American Diabetes Association website for further  information  - We will continue to monitor.   Microalbuminuria due to type 2 DM; kidney health - Advised patient of need to obtain urine sample yearly for assessment.  - Cont ARB  - To help preserve and improve kidney health, advised patient of critical need to control blood pressure and blood sugar, engage in regular physical activity, and hydrate adequately.  - Will continue to monitor.   Hypertension associated with type 2 DM - Blood pressure elevated on intake today. -  BP on re-check: slightly improved but not at goal  -  Reviewed that patient's blood pressure is suboptimally controlled at this time, but we will address this at next OV, and patient will discuss with cardiologist as well.  - Message sent to pt'scardiologist today for opinion on best adjustment to BP meds to gain better control given patient's cardiac history and much longer relations with them than me   - Patient will send a MyChart message to [place] with list of BP's and BS's at home in 2-4 weeks.  - Counseled patient on pathophysiology of disease and discussed various treatment options, which always includes dietary and lifestyle modification as first line.   - Lifestyle changes such as dash and heart healthy diets and engaging in a regular exercise program discussed extensively with patient.   - Ambulatory blood pressure monitoring encouraged at least 3 times weekly.  Keep log and bring in every office visit.  Reminded patient that if they ever feel poorly in any way, to check their blood pressure and pulse.  - We will continue to monitor   Hyperlipidemia associated with type 2 DM - LDL at goal of less than 70 last check on 06/04/2019. - Continue management as established.  - Repeat labs including FLP in July of 2021.  - Will continue to monitor.   Mood: -When asked about mood and PHQ results, patient states she is feeling much better about her future and feels her current medications of Prozac is perfectly fine. -Declines any need for adjustment    BMI Counseling - Body mass index is 35.01 kg/m, Morbid Obesity Explained to patient what BMI refers to, and what it means medically.    Told patient to think about it as a "medical risk stratification measurement" and how increasing BMI is associated with increasing risk/ or worsening state of various diseases such as hypertension, hyperlipidemia, diabetes, premature OA, depression etc.  American Heart Association guidelines for healthy  diet, basically Mediterranean diet, and exercise guidelines of 30 minutes 5 days per week or more discussed in detail.  Health counseling performed.  All questions answered.   Health Counseling & Preventative Maintenance - Advised patient to continue working toward exercising to improve overall mental, physical, and emotional health.    - Encouraged patient to engage in daily physical activity as tolerated, especially a formal exercise routine.  Recommended that the patient begin with 10 minutes of exercise per day, eventually striving for at least 150-300 minutes of moderate cardiovascular activity per week according to guidelines established by the [facility].   - Healthy dietary habits encouraged, including low-carb, and high amounts of lean protein in diet.   - Patient should also consume adequate amounts of water.    Education and routine counseling performed. Handouts provided.   Orders Placed This Encounter  Procedures  . POCT glycosylated hemoglobin (Hb A1C)  . POCT UA - Microalbumin    Meds ordered this encounter  Medications  . Semaglutide,0.25 or 0.5MG/DOS, (OZEMPIC, 0.25 OR 0.5  MG/DOSE,) 2 MG/1.5ML SOPN    Sig: Inject 0.25 mg into the skin once a week for 14 days, THEN 0.5 mg once a week for 28 days.    Dispense:  1 pen    Refill:  0  . Semaglutide, 1 MG/DOSE, (OZEMPIC, 1 MG/DOSE,) 2 MG/1.5ML SOPN    Sig: Inject 1 mg into the skin once a week. (use only once lower doses finished)    Dispense:  5 pen    Refill:  0    Medications Discontinued During This Encounter  Medication Reason  . ondansetron (ZOFRAN ODT) 4 MG disintegrating tablet Error  . glimepiride (AMARYL) 4 MG tablet          Please see AVS handed out to patient at the end of our visit for further patient instructions/ counseling done pertaining to today's office visit.    Note:  This document was prepared using Dragon voice recognition software and may include unintentional dictation errors.   The  [place] was signed into law in 2016 which includes the topic of electronic health records.  This provides immediate access to information in MyChart.  This includes consultation notes, operative notes, office notes, lab results and pathology reports.  If you have any questions about what you read please let us know at your next visit or call us at the office.  We are right here with you.    This case required medical decision making of at least moderate complexity.  This document serves as a record of services personally performed by Mellody Dance, DO. It was created on her behalf by Toni Amend, a trained medical scribe. The creation of this record is based on the scribe's personal observations and the provider's statements to them.    The above documentation from Toni Amend, medical scribe, has been reviewed by Marjory Sneddon, D.O.     ---------------------------------------------------------------------------------------------------------------------------------------------------------------------------------------------    Subjective:     Phillips Odor, am serving as scribe for Dr.Isaul Landi.   HPI: Samantha [name] is a pleasant 64 y.o. female who presents to [place] [facility] today to review their medical history with me and establish care.   I asked the patient to review their chronic problem list with me to ensure everything was updated and accurate.    All recent office visits with other providers, any medical records that patient brought in etc  - I reviewed today.     We asked pt to get [place] their medical records from [facility] providers/ specialists that they had seen within the past 3-5 years- if they are in private practice and/or do not work for [employer], [facility], [place], [place] or [employer] owned practice.  Told them to call their specialists to clarify this if they are not sure.    Reason for Establishing Care:    Saw Dr. Helane Rima for a long time prior, and was very satisfied with her care.  Seen recently at [facility] by FNP Tobie Lords, but did not wish to return there due to disagreements with provider's decision.   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  Married to husband.   Family History No known family history of thyroid cancer or personal  - see fam hx below   Past Medical History Denies history of thyroid cancer.  Denies history of pancreatitis. - see prob list below   Notes today "I want somebody to help me, because I have felt awful."  Doesn't feel good about BSs or BPs.  Sluggish, tired, and some anxiety    - Diabetes Mellitus Her goal A1c is under 7.0, but states "it's been a long while" since her A1c was last under 7.0.   Says "I don't know why it keeps being that high."  Notes her sugar does not go down when she exercises or eats right.  Believes she has been a diabetic for about 10-20 years.  She has been on insulin for a over a year now, 16 units of Tresiba at bedtime.  She hasn't used any short-acting insulin.  States she takes glimepiride at night, but with Dr. Lavone Neri, used to be managed on one glimepiride in the morning, one glimepiride at night, plus ten units of Tresiba.  She has also tried metformin in the past, been on for sometime now  Reports that she was taken off of glimepiride last April, and notes she can't take Jardiance because she is allergic to it.     Says "it broke me out bad."  However, she was recently prescribed Jardiance again by her [place] provider, Tobie Lords, NP, despite the patient's objection and reported history of S-E, along with an antibiotic.    Last A1C in the office was:  Lab Results  Component Value Date   HGBA1C 7.3 (A) 02/23/2020   HGBA1C (H) 05/12/2010    7.9 (NOTE)                                                                       According to the ADA Clinical Practice Recommendations for  2011, when HbA1c is used as a screening test:   >=6.5%   Diagnostic of Diabetes Mellitus           (if abnormal result  is confirmed)  5.7-6.4%   Increased risk of developing Diabetes Mellitus  References:Diagnosis and Classification of Diabetes Mellitus,Diabetes RXVQ,0086,76(PPJKD 1):S62-S69 and Standards of Medical Care in         Diabetes - 2011,Diabetes Care,2011,34  (Suppl 1):S11-S61.    Lab Results  Component Value Date   MICROALBUR 80 02/23/2020   LDLCALC 47 06/02/2010   CREATININE 0.84 08/10/2019    BP Readings from Last 3 Encounters:  02/23/20 (!) 169/79  11/04/19 (!) 158/76  08/11/19 (!) 161/68    Wt Readings from Last 3 Encounters:  02/23/20 244 lb (110.7 kg)  11/04/19 244 lb (110.7 kg)  08/10/19 245 lb (111.1 kg)     HPI:  Hypertension: -  Her blood pressure at home has been running: 150's-160's/54 at home.  Says that her cardiologist "didn't say anything bad" about her elevated blood pressure at home.  Notes "sometimes it's down to 54 on the bottom.  - Patient reports good compliance with medication and/or lifestyle modification  - Her denies acute concerns or problems related to treatment plan  - She denies new onset of: chest pain, exercise intolerance, shortness of breath, dizziness, visual changes, headache, lower extremity swelling or claudication.   Last 3 blood pressure readings in our office are as follows: BP Readings from Last 3 Encounters:  02/23/20 (!) 169/79  11/04/19 (!) 158/76  08/11/19 (!) 161/68   Filed Weights   02/23/20 1522  Weight: 244 lb (110.7 kg)  HPI:  Hyperlipidemia: 64 y.o. female here for cholesterol follow-up.   - She denies new onset of: myalgias, arthralgias, increased fatigue more than normal, chest pains, exercise intolerance, shortness of breath, dizziness, visual changes, headache, lower extremity swelling or claudication.   Most recent cholesterol panel was:  Lab Results  Component Value Date   CHOL 137  06/02/2010   HDL 54 06/02/2010   LDLCALC 47 06/02/2010   TRIG 182 (H) 06/02/2010   CHOLHDL 2.5 Ratio 06/02/2010    Hepatic Function Latest Ref Rng & Units 08/10/2019 07/29/2019 07/15/2017  Total Protein 6.5 - 8.1 g/dL 7.2 7.1 5.6(L)  Albumin 3.5 - 5.0 g/dL 3.6 3.5 2.9(L)  AST 15 - 41 U/L _0 ALT 0 - 44 U/L 23 27 45  Alk Phosphatase 38 - 126 U/L 60 55 37(L)  Total Bilirubin 0.3 - 1.2 mg/dL 0.2(L) 0.5 0.5  Bilirubin, Direct 0.0 - 0.2 mg/dL 0.1 - -    Wt Readings from Last 3 Encounters:  02/23/20 244 lb (110.7 kg)  11/04/19 244 lb (110.7 kg)  08/10/19 245 lb (111.1 kg)   BP Readings from Last 3 Encounters:  02/23/20 (!) 169/79  11/04/19 (!) 158/76  08/11/19 (!) 161/68   Pulse Readings from Last 3 Encounters:  02/23/20 68  11/04/19 70  08/11/19 62   BMI Readings from Last 3 Encounters:  02/23/20 35.01 kg/m  11/04/19 40.60 kg/m  08/10/19 40.77 kg/m     Depression screen [facility] 2/9 02/23/2020 11/18/2016 02/28/2016  Decreased Interest 1 0 0  Down, Depressed, Hopeless 1 0 0  PHQ - 2 Score 2 0 0  Altered sleeping 1 - -  Tired, decreased energy 2 - -  Change in appetite 1 - -  Feeling bad or failure about yourself  1 - -  Trouble concentrating 1 - -  Moving slowly or fidgety/restless 0 - -  Suicidal thoughts 0 - -  PHQ-9 Score 8 - -  Difficult doing work/chores Very difficult - -     GAD 7 : Generalized Anxiety Score 02/23/2020  Nervous, Anxious, on Edge 0  Control/stop worrying 0  Worry too much - different things 1  Trouble relaxing 0  Restless 1  Easily annoyed or irritable 1  Afraid - awful might happen 0  Total GAD 7 Score 3  Anxiety Difficulty Somewhat difficult     Patient Care Team    Relationship Specialty Notifications Start End  Mellody Dance, DO PCP - General Family Medicine  02/23/20   Nahser, Wonda Cheng, MD Consulting Physician Cardiology  02/23/20   Collene Gobble, MD Consulting Physician Pulmonary Disease  02/23/20   Berenice Primas, MD  Referring Physician Orthopedic Surgery  02/23/20 02/23/20  [facility]    02/23/20   Carol Ada, MD Consulting Physician Gastroenterology  02/23/20   [place], [place]  Optometry  02/23/20   [facility]    02/23/20   Gregor Hams, [place]  Family Medicine  02/23/20 02/23/20    Patient Active Problem List   Diagnosis Date Noted  . Hyperlipidemia associated with type 2 diabetes mellitus ([place]) 02/23/2020  . Microalbuminuria due to type 2 diabetes mellitus ([place]) 02/23/2020  . Type 2 diabetes mellitus with morbid obesity ([place]) 02/23/2020  . Hypertension associated with diabetes ([place]) 08/10/2016  . Diabetes ([place]) 11/20/2015  . GERD (gastroesophageal reflux disease) 10/24/2017  . LBBB (left bundle branch block) 08/10/2016  . B12 deficiency 02/23/2020  . Vitamin D deficiency  02/23/2020  . Family history of colon cancer-grandmother 02/23/2020  . Family history of heart disease 02/23/2020  . Family history of diabetes mellitus (DM) 02/23/2020  . Family history of mixed hyperlipidemia 02/23/2020  . Family history of hypertension 02/23/2020  . Mood disorder ([place]) 02/23/2020  . Degenerative disc disease, lumbar 07/19/2018  . 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  . Spondylosis of lumbar spine 07/19/2018  . Lumbar radiculopathy 07/05/2018  . 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 Eustachian tube dysfunction, bilateral 02/21/2018  . Recurrent sinusitis 02/21/2018  . Tympanic membrane retraction, left 02/21/2018  . Cough 08/14/2017  . Dyspnea 08/14/2017  . Pulmonary nodule, right 08/14/2017  . Back pain 07/12/2017  . Obesity (BMI 30-39.9) 06/11/2017  . Leukocytosis 08/10/2016  . Pain in the chest 08/09/2016    As reported by pt:  Past Medical History:  Diagnosis Date  . Arthritis   . Dyslipidemia   . Heart disease   . Heart murmur 2011  . Left bundle branch block 2011   . Type II or unspecified type diabetes mellitus without mention of complication, not stated as uncontrolled   . Unspecified essential hypertension      Past Surgical History:  Procedure Laterality Date  . REPLACEMENT TOTAL KNEE Left 2017  . TONSILLECTOMY  1962     Family History  Problem Relation Age of Onset  . Diabetes Mother   . Hypertension Mother   . CAD Mother   . Hypertension Father   . Diabetes Father   . CAD Father   . Diabetes Brother   . Hyperlipidemia Brother   . Hypertension Brother   . CAD Brother   . Diabetes Sister   . Hypertension Sister      Social History   Substance and Sexual Activity  Drug Use No     Social History   Substance and Sexual Activity  Alcohol Use No     Social History   Tobacco Use  Smoking Status Never Smoker  Smokeless Tobacco Never Used     Current Meds  Medication Sig  . aspirin 81 MG tablet Take 81 mg by mouth every morning.   Marland Kitchen azelastine (ASTELIN) 0.1 % nasal spray Place 1 spray into both nostrils daily as needed for allergies.   . budesonide-formoterol (SYMBICORT) 80-4.5 MCG/ACT inhaler Inhale 2 puffs into the lungs 2 (two) times daily.  . Calcium Citrate-Vitamin D (CALCIUM + D PO) Take 1 tablet by mouth every morning.   . cetirizine (ZYRTEC) 10 MG tablet Take 10 mg by mouth daily.  . diclofenac (VOLTAREN) 75 MG EC tablet Take 75 mg by mouth 2 (two) times daily.  Marland Kitchen FLUoxetine (PROZAC) 20 MG capsule Take 20 mg by mouth every morning.   . gabapentin (NEURONTIN) 300 MG capsule Take 300 mg by mouth 2 (two) times daily.  . hydrochlorothiazide (MICROZIDE) 12.5 MG capsule Take 12.5 mg by mouth daily.  . Insulin Degludec (TRESIBA) 100 UNIT/ML SOLN Inject 12 Units into the skin at bedtime.  Marland Kitchen ipratropium-albuterol (DUONEB) 0.5-2.5 (3) MG/3ML SOLN Inhale 3 mLs into the lungs daily as needed (wheezing).   Marland Kitchen lovastatin (MEVACOR) 20 MG tablet Take 20 mg by mouth at bedtime.   . metFORMIN (GLUCOPHAGE) 1000 MG tablet Take  1,000 mg by mouth 2 (two) times daily with a meal.  . nitroGLYCERIN (NITROSTAT) 0.4 MG SL tablet Place 1 tablet (0.4 mg total) under the tongue every 5 (five) minutes as needed for chest  pain.  . Omega-3 Fatty Acids (FISH OIL) 1000 MG CAPS Take 1,000 mg by mouth every morning.   . pantoprazole (PROTONIX) 40 MG tablet Take 1 tablet (40 mg total) by mouth daily.  . valsartan (DIOVAN) 160 MG tablet Take 1 tablet (160 mg total) by mouth daily.  . verapamil (VERELAN PM) 240 MG 24 hr capsule Take 240 mg by mouth every morning.   . [DISCONTINUED] glimepiride (AMARYL) 4 MG tablet Take by mouth.     Allergies: Iohexol and Jardiance [empagliflozin]   Review of Systems  Constitutional: Negative for chills, diaphoresis, fever, malaise/fatigue and weight loss.  HENT: Positive for hearing loss. Negative for congestion, sore throat and tinnitus.   Eyes: Negative for blurred vision, double vision and photophobia.       + vision change with aging.  Respiratory: Positive for wheezing. Negative for cough.        Occ wheeze- none currently  Cardiovascular: Negative for chest pain and palpitations.  Gastrointestinal: Positive for diarrhea. Negative for blood in stool, nausea and vomiting.       Loose stools from time to time due to IBS  Genitourinary: Negative for dysuria, frequency and urgency.  Musculoskeletal: Positive for joint pain. Negative for myalgias.  Skin: Negative for itching and rash.  Neurological: Positive for headaches. Negative for dizziness, focal weakness and weakness.  Endo/Heme/Allergies: Positive for environmental allergies. Negative for polydipsia. Does not bruise/bleed easily.  Psychiatric/Behavioral: Negative for depression. The patient is nervous/anxious. The patient does not have insomnia.     Objective:  First BP:  172/74  Blood pressure (!) 169/79, pulse 68, temperature 97.6 F (36.4 C), temperature source Oral, resp. rate 14, height _0  (1.778 m), weight 244 lb (110.7  kg), SpO2 94 %. Body mass index is 35.01 kg/m. General: Well Developed, well nourished, and in no acute distress.  Neuro: Alert and oriented x3, extra-ocular muscles intact, sensation grossly intact.  HEENT:/AT, PERRLA, neck supple, No carotid bruits Skin: no gross rashes  Cardiac: Regular rate and rhythm Respiratory: Essentially clear to auscultation bilaterally. Not using accessory muscles, speaking in full sentences.  Abdominal: not grossly distended Musculoskeletal: Ambulates w/o diff, FROM * 4 ext.  Vasc: less 2 sec cap RF, warm and pink  Psych:  No HI/SI, judgement and insight good, Euthymic mood. Full Affect.   Recent Results (from the past 2160 hour(s))  POCT UA - Microalbumin     Status: None   Collection Time: 02/23/20  4:14 PM  Result Value Ref Range   Microalbumin Ur, POC 80 mg/L   Creatinine, POC 300 mg/dL   Albumin/Creatinine Ratio, Urine, POC 30-300   POCT glycosylated hemoglobin (Hb A1C)     Status: Abnormal   Collection Time: 02/23/20  4:17 PM  Result Value Ref Range   Hemoglobin A1C 7.3 (A) 4.0 - 5.6 %   HbA1c POC (<> result, manual entry)     HbA1c, POC (prediabetic range)     HbA1c, POC (controlled diabetic range)

## 2020-03-29 ENCOUNTER — Other Ambulatory Visit: Payer: Self-pay

## 2020-03-29 NOTE — Telephone Encounter (Signed)
We have not prescribed these medications for the patient previously.  Please review and refill if appropriate.  T. Jesselle Laflamme, CMA  

## 2020-03-31 MED ORDER — DICLOFENAC SODIUM 75 MG PO TBEC: 75.0000 mg | DELAYED_RELEASE_TABLET | Freq: Two times a day (BID) | ORAL | 0 refills | Status: DC

## 2020-03-31 MED ORDER — VALSARTAN 160 MG PO TABS: 160.0000 mg | ORAL_TABLET | Freq: Every day | ORAL | 0 refills | Status: DC

## 2020-03-31 MED ORDER — LOVASTATIN 20 MG PO TABS: 20.0000 mg | ORAL_TABLET | Freq: Every day | ORAL | 0 refills | Status: DC

## 2020-04-12 ENCOUNTER — Other Ambulatory Visit: Payer: Self-pay

## 2020-04-12 DIAGNOSIS — E1169 Type 2 diabetes mellitus with other specified complication: Secondary | ICD-10-CM

## 2020-04-12 NOTE — Telephone Encounter (Signed)
We have not prescribed these medications for the patient previously.  Please review and refill if appropriate.  T. Danney Bungert, CMA  

## 2020-04-14 MED ORDER — METFORMIN HCL 1000 MG PO TABS: 1000.0000 mg | ORAL_TABLET | Freq: Two times a day (BID) | ORAL | 1 refills | Status: DC

## 2020-05-20 ENCOUNTER — Ambulatory Visit: Payer: Medicare HMO | Admitting: Family Medicine

## 2020-05-20 ENCOUNTER — Ambulatory Visit (INDEPENDENT_AMBULATORY_CARE_PROVIDER_SITE_OTHER): Payer: Medicare HMO | Admitting: Physician Assistant

## 2020-05-20 ENCOUNTER — Encounter: Payer: Self-pay | Admitting: Physician Assistant

## 2020-05-20 ENCOUNTER — Other Ambulatory Visit: Payer: Self-pay

## 2020-05-20 VITALS — BP 144/73 | HR 76 | Temp 97.6°F | Ht 70.0 in | Wt 237.0 lb

## 2020-05-20 DIAGNOSIS — E785 Hyperlipidemia, unspecified: Secondary | ICD-10-CM | POA: Diagnosis not present

## 2020-05-20 DIAGNOSIS — I152 Hypertension secondary to endocrine disorders: Secondary | ICD-10-CM

## 2020-05-20 DIAGNOSIS — Z794 Long term (current) use of insulin: Secondary | ICD-10-CM

## 2020-05-20 DIAGNOSIS — I10 Essential (primary) hypertension: Secondary | ICD-10-CM | POA: Diagnosis not present

## 2020-05-20 DIAGNOSIS — E1169 Type 2 diabetes mellitus with other specified complication: Secondary | ICD-10-CM

## 2020-05-20 DIAGNOSIS — E1159 Type 2 diabetes mellitus with other circulatory complications: Secondary | ICD-10-CM

## 2020-05-20 LAB — POCT GLYCOSYLATED HEMOGLOBIN (HGB A1C): Hemoglobin A1C: 7.7 % — AB (ref 4.0–5.6)

## 2020-05-20 NOTE — Progress Notes (Signed)
Established Patient Office Visit  Subjective:  Patient ID: Samantha [name], female    DOB: 03-16-1956  Age: 64 y.o. MRN: [id number]  CC:  Chief Complaint  Patient presents with  . Hypertension  . Hyperlipidemia  . Diabetes    HPI Samantha [name] presents for follow-up on hypertension, diabetes mellitus, and hyperlipidemia. Pt recently established care in 01/2020.  HTN: Pt denies chest pain, palpitations, dizziness, or increased edema from baseline. Taking medication as directed without side effects. She brings her BP log with readings ranging 104-137/59-75. Pt follows a low salt diet.  Diabetes: Pt denies increased urination or thirst. Pt reports medication compliance. Denies hypoglycemic events. She brings her FBS log and readings range 123-130s, few readings 150s-170. States she has not changed her diet and continues to monitor her carbohydrates and glucose. Reports she can improve on increasing her physical activity. She also reports increased stress, she has been working more hours at work.  HLD: Pt taking medication as directed without issues. Denies side effects including myalgias and RUQ pain.    Past Medical History:  Diagnosis Date  . Arthritis   . Dyslipidemia   . Heart disease   . Heart murmur 2011  . Left bundle branch block 2011  . Type II or unspecified type diabetes mellitus without mention of complication, not stated as uncontrolled   . Unspecified essential hypertension     Past Surgical History:  Procedure Laterality Date  . REPLACEMENT TOTAL KNEE Left 2017  . TONSILLECTOMY  1962    Family History  Problem Relation Age of Onset  . Diabetes Mother   . Hypertension Mother   . CAD Mother   . Hypertension Father   . Diabetes Father   . CAD Father   . Diabetes Brother   . Hyperlipidemia Brother   . Hypertension Brother   . CAD Brother   . Diabetes Sister   . Hypertension Sister     Social History   Socioeconomic History  . Marital status:  Married    Spouse name: Not on file  . Number of children: Not on file  . Years of education: Not on file  . Highest education level: Not on file  Occupational History  . Not on file  Tobacco Use  . Smoking status: Never Smoker  . Smokeless tobacco: Never Used  Vaping Use  . Vaping Use: Never used  Substance and Sexual Activity  . Alcohol use: No  . Drug use: No  . Sexual activity: Not Currently  Other Topics Concern  . Not on file  Social History Narrative  . Not on file   Social Determinants of Health   Financial Resource Strain:   . Difficulty of Paying Living Expenses:   Food Insecurity:   . Worried About Charity fundraiser in the Last Year:   . Arboriculturist in the Last Year:   Transportation Needs:   . Film/video editor (Medical):   Marland Kitchen Lack of Transportation (Non-Medical):   Physical Activity:   . Days of Exercise per Week:   . Minutes of Exercise per Session:   Stress:   . Feeling of Stress :   Social Connections:   . Frequency of Communication with Friends and Family:   . Frequency of Social Gatherings with Friends and Family:   . Attends Religious Services:   . Active Member of Clubs or Organizations:   . Attends Archivist Meetings:   Marland Kitchen Marital Status:   Intimate Production manager  Violence:   . Fear of Current or Ex-Partner:   . Emotionally Abused:   Marland Kitchen Physically Abused:   . Sexually Abused:     Outpatient Medications Prior to Visit  Medication Sig Dispense Refill  . aspirin 81 MG tablet Take 81 mg by mouth every morning.     Marland Kitchen azelastine (ASTELIN) 0.1 % nasal spray Place 1 spray into both nostrils daily as needed for allergies.     . budesonide-formoterol (SYMBICORT) 80-4.5 MCG/ACT inhaler Inhale 2 puffs into the lungs 2 (two) times daily. 1 Inhaler 12  . Calcium Citrate-Vitamin D (CALCIUM + D PO) Take 1 tablet by mouth every morning.     . cetirizine (ZYRTEC) 10 MG tablet Take 10 mg by mouth daily.    . Cobalamin Combinations (NEURIVA PLUS  PO) Take by mouth.    . diclofenac (VOLTAREN) 75 MG EC tablet Take 1 tablet (75 mg total) by mouth 2 (two) times daily. 180 tablet 0  . FLUoxetine (PROZAC) 20 MG capsule Take 20 mg by mouth every morning.     . gabapentin (NEURONTIN) 300 MG capsule Take 300 mg by mouth 2 (two) times daily.    . hydrochlorothiazide (MICROZIDE) 12.5 MG capsule Take 12.5 mg by mouth daily.    . Insulin Degludec (TRESIBA) 100 UNIT/ML SOLN Inject 12 Units into the skin at bedtime.    Marland Kitchen ipratropium-albuterol (DUONEB) 0.5-2.5 (3) MG/3ML SOLN Inhale 3 mLs into the lungs daily as needed (wheezing).     Marland Kitchen lovastatin (MEVACOR) 20 MG tablet Take 1 tablet (20 mg total) by mouth at bedtime. 90 tablet 0  . metFORMIN (GLUCOPHAGE) 1000 MG tablet Take 1 tablet (1,000 mg total) by mouth 2 (two) times daily with a meal. 180 tablet 1  . nitroGLYCERIN (NITROSTAT) 0.4 MG SL tablet Place 1 tablet (0.4 mg total) under the tongue every 5 (five) minutes as needed for chest pain. 30 tablet 12  . Omega-3 Fatty Acids (FISH OIL) 1000 MG CAPS Take 1,000 mg by mouth every morning.     . pantoprazole (PROTONIX) 40 MG tablet Take 1 tablet (40 mg total) by mouth daily. 30 tablet 11  . Semaglutide, 1 MG/DOSE, (OZEMPIC, 1 MG/DOSE,) 2 MG/1.5ML SOPN Inject 1 mg into the skin once a week. (use only once lower doses finished) 5 pen 0  . valsartan (DIOVAN) 160 MG tablet Take 1 tablet (160 mg total) by mouth daily. 90 tablet 0  . verapamil (VERELAN PM) 240 MG 24 hr capsule Take 240 mg by mouth every morning.     . Semaglutide,0.25 or 0.'5MG'$ /DOS, (OZEMPIC, 0.25 OR 0.5 MG/DOSE,) 2 MG/1.5ML SOPN Inject 0.25 mg into the skin once a week for 14 days, THEN 0.5 mg once a week for 28 days. 1 pen 0   No facility-administered medications prior to visit.    Allergies  Allergen Reactions  . Iohexol Anaphylaxis     Code: SOB, Desc: respiratory distress-ASM, Onset Date: 16109604   . Jardiance [Empagliflozin] Rash    ROS Review of Systems A fourteen system  review of systems was performed and found to be positive as per HPI.  Objective:    Physical Exam General:  Well Developed, well nourished, appropriate for stated age.  Neuro:  Alert and oriented,  extra-ocular muscles intact, no focal deficits  HEENT:  Normocephalic, atraumatic, neck supple Skin:  no gross rash, warm, pink. Cardiac:  RRR, S1 S2 Respiratory:  ECTA B/L and A/P, Not using accessory muscles, speaking in full sentences- unlabored. Vascular:  Ext warm, no cyanosis apprec.; cap RF less 2 sec. Psych:  No HI/SI, judgement and insight good, Euthymic mood. Full Affect.  BP (!) 144/73   Pulse 76   Temp 97.6 F (36.4 C) (Oral)   Ht '5\' 10"'$  (1.778 m)   Wt 237 lb (107.5 kg)   SpO2 96%   BMI 34.01 kg/m  Wt Readings from Last 3 Encounters:  05/20/20 237 lb (107.5 kg)  02/23/20 244 lb (110.7 kg)  11/04/19 244 lb (110.7 kg)     Health Maintenance Due  Topic Date Due  . OPHTHALMOLOGY EXAM  Never done  . COVID-19 Vaccine (1) Never done  . MAMMOGRAM  02/15/2018  . DEXA SCAN  Never done    There are no preventive care reminders to display for this patient.  Lab Results  Component Value Date   TSH 2.054 06/02/2010   Lab Results  Component Value Date   WBC 12.1 (H) 08/10/2019   HGB 11.2 (L) 08/10/2019   HCT 36.6 08/10/2019   MCV 88.6 08/10/2019   PLT 314 08/10/2019   Lab Results  Component Value Date   NA 138 08/10/2019   K 4.1 08/10/2019   CO2 29 08/10/2019   GLUCOSE 158 (H) 08/10/2019   BUN 26 (H) 08/10/2019   CREATININE 0.84 08/10/2019   BILITOT 0.2 (L) 08/10/2019   ALKPHOS 60 08/10/2019   AST 16 08/10/2019   ALT 23 08/10/2019   PROT 7.2 08/10/2019   ALBUMIN 3.6 08/10/2019   CALCIUM 9.2 08/10/2019   ANIONGAP 10 08/10/2019   Lab Results  Component Value Date   CHOL 137 06/02/2010   Lab Results  Component Value Date   HDL 54 06/02/2010   Lab Results  Component Value Date   LDLCALC 47 06/02/2010   Lab Results  Component Value Date   TRIG 182  (H) 06/02/2010   Lab Results  Component Value Date   CHOLHDL 2.5 Ratio 06/02/2010   Lab Results  Component Value Date   HGBA1C 7.7 (A) 05/20/2020      Assessment & Plan:   Problem List Items Addressed This Visit      Cardiovascular and Mediastinum   Hypertension associated with diabetes ([place])     Endocrine   Diabetes ([place]) - Primary   Relevant Orders   POCT glycosylated hemoglobin (Hb A1C) (Completed)   Hyperlipidemia associated with type 2 diabetes mellitus ([place])     Type II diabetes mellitus with other specified complication, with long-term current use of insulin: -A1c today 7.7, increased from prior (7.3) -Patient reports compliance with following diabetic diet and reports increased stress, which could be contributing to increase in A1c.  Discussed with patient to increase physical activity such as walking for 30 minutes 3-4 times per week, increase hydration, work on reducing stress levels.  If A1c continues to increase or fails to improve at next follow-up in 3 months, then will adjust insulin dose.  Patient verbalized understanding and is agreeable. -Continue metformin 1000 mg twice daily, Ozempic 2 mg/1.5 mL inject '1MG'$  once weekly, insulin degludec 12 units at bedtime -Advised ambulatory glucose monitoring, notify the clinic if FBS consistently <80 or >160 and will make medication adjustments before follow-up appointment. -Continue low carbohydrate and glucose diet. -Pt reports she had her annual diabetic eye exam January of this year.   Hypertension associated with diabetes: -Blood pressure today 144/73 HR 76, stable -Discussed with patient the importance of controlling blood pressure and diabetes mellitus to preserve kidney function. Pt's ambulatory BP  monitoring has been stable so will not make any medication adjustments at this time. -Continue HCTZ 12.5 mg, valsartan 160 mg and verapamil 240 mg. -Recommended ambulatory BP and pulse monitoring, notify the clinic if BP  consistently above 140/90. -Followed DASH diet and increase physical activity. -Stay well hydrated, at least 64 fl oz  Hyperlipidemia associated with type 2 diabetes mellitus: -Continue lovastatin 20 mg and fish oil. -Place future lab orders to recheck lipid panel and hepatic function within the next few weeks. -Follow heart healthy diet.   No orders of the defined types were placed in this encounter.   Follow-up: Return in about 3 months (around 08/20/2020) for DM, HLD, HTN; lab visit in 1-3 wks FBW  (lipid, cmp, a1c, mag, tsh, cbc).   Note:  This note was prepared with assistance of Dragon voice recognition software. Occasional wrong-word or sound-a-like substitutions may have occurred due to the inherent limitations of voice recognition software.   Lorrene Reid, PA-C

## 2020-05-20 NOTE — Patient Instructions (Signed)

## 2020-06-04 ENCOUNTER — Other Ambulatory Visit: Payer: Medicare HMO

## 2020-06-09 ENCOUNTER — Other Ambulatory Visit: Payer: Self-pay

## 2020-06-09 DIAGNOSIS — E1159 Type 2 diabetes mellitus with other circulatory complications: Secondary | ICD-10-CM

## 2020-06-09 DIAGNOSIS — E1169 Type 2 diabetes mellitus with other specified complication: Secondary | ICD-10-CM

## 2020-06-09 DIAGNOSIS — E538 Deficiency of other specified B group vitamins: Secondary | ICD-10-CM

## 2020-06-09 DIAGNOSIS — D72829 Elevated white blood cell count, unspecified: Secondary | ICD-10-CM

## 2020-06-09 DIAGNOSIS — I152 Hypertension secondary to endocrine disorders: Secondary | ICD-10-CM

## 2020-06-09 DIAGNOSIS — E559 Vitamin D deficiency, unspecified: Secondary | ICD-10-CM

## 2020-06-10 ENCOUNTER — Other Ambulatory Visit: Payer: Medicare HMO

## 2020-06-10 ENCOUNTER — Other Ambulatory Visit: Payer: Self-pay

## 2020-06-10 DIAGNOSIS — E785 Hyperlipidemia, unspecified: Secondary | ICD-10-CM | POA: Diagnosis not present

## 2020-06-10 DIAGNOSIS — E538 Deficiency of other specified B group vitamins: Secondary | ICD-10-CM | POA: Diagnosis not present

## 2020-06-10 DIAGNOSIS — D72829 Elevated white blood cell count, unspecified: Secondary | ICD-10-CM | POA: Diagnosis not present

## 2020-06-10 DIAGNOSIS — I152 Hypertension secondary to endocrine disorders: Secondary | ICD-10-CM

## 2020-06-10 DIAGNOSIS — E1159 Type 2 diabetes mellitus with other circulatory complications: Secondary | ICD-10-CM | POA: Diagnosis not present

## 2020-06-10 DIAGNOSIS — I10 Essential (primary) hypertension: Secondary | ICD-10-CM | POA: Diagnosis not present

## 2020-06-10 DIAGNOSIS — E1169 Type 2 diabetes mellitus with other specified complication: Secondary | ICD-10-CM

## 2020-06-10 DIAGNOSIS — E559 Vitamin D deficiency, unspecified: Secondary | ICD-10-CM

## 2020-06-10 MED ORDER — FLUOXETINE HCL 20 MG PO CAPS: 20.0000 mg | ORAL_CAPSULE | ORAL | 0 refills | Status: DC

## 2020-06-10 MED ORDER — VERAPAMIL HCL ER 240 MG PO CP24: 240.0000 mg | ORAL_CAPSULE | ORAL | 0 refills | Status: DC

## 2020-06-10 NOTE — Addendum Note (Signed)
Addended by: Fonnie Mu on: 06/10/2020 09:17 AM   Modules accepted: Orders

## 2020-06-11 LAB — COMPREHENSIVE METABOLIC PANEL
ALT: 26 IU/L (ref 0–32)
AST: 15 IU/L (ref 0–40)
Albumin/Globulin Ratio: 1.4 (ref 1.2–2.2)
Albumin: 4.2 g/dL (ref 3.8–4.8)
Alkaline Phosphatase: 68 IU/L (ref 48–121)
BUN/Creatinine Ratio: 23 (ref 12–28)
BUN: 23 mg/dL (ref 8–27)
Bilirubin Total: 0.5 mg/dL (ref 0.0–1.2)
CO2: 26 mmol/L (ref 20–29)
Calcium: 9.1 mg/dL (ref 8.7–10.3)
Chloride: 99 mmol/L (ref 96–106)
Creatinine, Ser: 0.98 mg/dL (ref 0.57–1.00)
GFR calc Af Amer: 70 mL/min/{1.73_m2} (ref >59)
GFR calc non Af Amer: 60 mL/min/{1.73_m2} (ref >59)
Globulin, Total: 2.9 g/dL (ref 1.5–4.5)
Glucose: 150 mg/dL — ABNORMAL HIGH (ref 65–99)
Potassium: 4.1 mmol/L (ref 3.5–5.2)
Sodium: 139 mmol/L (ref 134–144)
Total Protein: 7.1 g/dL (ref 6.0–8.5)

## 2020-06-11 LAB — CBC WITH DIFFERENTIAL/PLATELET
Basophils Absolute: 0.1 10*3/uL (ref 0.0–0.2)
Basos: 1 %
EOS (ABSOLUTE): 0.3 10*3/uL (ref 0.0–0.4)
Eos: 3 %
Hematocrit: 36.9 % (ref 34.0–46.6)
Hemoglobin: 12.4 g/dL (ref 11.1–15.9)
Immature Grans (Abs): 0 10*3/uL (ref 0.0–0.1)
Immature Granulocytes: 0 %
Lymphocytes Absolute: 2.5 10*3/uL (ref 0.7–3.1)
Lymphs: 23 %
MCH: 27.6 pg (ref 26.6–33.0)
MCHC: 33.6 g/dL (ref 31.5–35.7)
MCV: 82 fL (ref 79–97)
Monocytes Absolute: 0.6 10*3/uL (ref 0.1–0.9)
Monocytes: 6 %
Neutrophils Absolute: 7.4 10*3/uL — ABNORMAL HIGH (ref 1.4–7.0)
Neutrophils: 67 %
Platelets: 339 10*3/uL (ref 150–450)
RBC: 4.5 x10E6/uL (ref 3.77–5.28)
RDW: 13.8 % (ref 11.7–15.4)
WBC: 11 10*3/uL — ABNORMAL HIGH (ref 3.4–10.8)

## 2020-06-11 LAB — LIPID PANEL
Chol/HDL Ratio: 2.3 ratio (ref 0.0–4.4)
Cholesterol, Total: 121 mg/dL (ref 100–199)
HDL: 52 mg/dL (ref >39)
LDL Chol Calc (NIH): 47 mg/dL (ref 0–99)
Triglycerides: 122 mg/dL (ref 0–149)
VLDL Cholesterol Cal: 22 mg/dL (ref 5–40)

## 2020-06-11 LAB — HEMOGLOBIN A1C
Est. average glucose Bld gHb Est-mCnc: 169 mg/dL
Hgb A1c MFr Bld: 7.5 % — ABNORMAL HIGH (ref 4.8–5.6)

## 2020-06-11 LAB — VITAMIN D 25 HYDROXY (VIT D DEFICIENCY, FRACTURES): Vit D, 25-Hydroxy: 44.1 ng/mL (ref 30.0–100.0)

## 2020-06-11 LAB — VITAMIN B12: Vitamin B-12: 267 pg/mL (ref 232–1245)

## 2020-06-11 LAB — TSH: TSH: 1.95 u[IU]/mL (ref 0.450–4.500)

## 2020-06-22 ENCOUNTER — Other Ambulatory Visit: Payer: Self-pay

## 2020-06-22 ENCOUNTER — Other Ambulatory Visit: Payer: Self-pay | Admitting: Physician Assistant

## 2020-06-22 ENCOUNTER — Other Ambulatory Visit: Payer: Self-pay | Admitting: Family Medicine

## 2020-06-22 DIAGNOSIS — I152 Hypertension secondary to endocrine disorders: Secondary | ICD-10-CM

## 2020-06-22 DIAGNOSIS — E1159 Type 2 diabetes mellitus with other circulatory complications: Secondary | ICD-10-CM

## 2020-06-22 DIAGNOSIS — E1169 Type 2 diabetes mellitus with other specified complication: Secondary | ICD-10-CM

## 2020-06-22 NOTE — Telephone Encounter (Signed)
We have not prescribed this medication for the patient previously.  Please review and refill if appropriate.  T. Frederick Marro, CMA  

## 2020-06-23 ENCOUNTER — Other Ambulatory Visit: Payer: Self-pay | Admitting: Physician Assistant

## 2020-06-23 DIAGNOSIS — E1169 Type 2 diabetes mellitus with other specified complication: Secondary | ICD-10-CM

## 2020-06-23 MED ORDER — OZEMPIC (1 MG/DOSE) 2 MG/1.5ML ~~LOC~~ SOPN: 1.0000 mg | PEN_INJECTOR | SUBCUTANEOUS | 0 refills | Status: DC

## 2020-06-24 MED ORDER — HYDROCHLOROTHIAZIDE 12.5 MG PO CAPS: 12.5000 mg | ORAL_CAPSULE | Freq: Every day | ORAL | 1 refills | Status: DC

## 2020-07-29 ENCOUNTER — Other Ambulatory Visit: Payer: Self-pay | Admitting: Physician Assistant

## 2020-07-29 MED ORDER — GABAPENTIN 300 MG PO CAPS: 300.0000 mg | ORAL_CAPSULE | Freq: Two times a day (BID) | ORAL | 0 refills | Status: DC

## 2020-07-29 NOTE — Telephone Encounter (Signed)
Patient is requesting a refill of her gabapentin, if approved please send to [facility].

## 2020-07-29 NOTE — Telephone Encounter (Signed)
Patient last seen 05/20/20 and advised to follow up in 3 months.  Patient has apt scheduled for 08/19/20.   Requesting refill for Gabapentin. This med has not been prescribed by [place] before.  Please approve if deemed appropriate. AS, CMA

## 2020-07-29 NOTE — Addendum Note (Signed)
Addended by: Mickel Crow on: 07/29/2020 10:20 AM   Modules accepted: Orders

## 2020-08-06 ENCOUNTER — Other Ambulatory Visit: Payer: Medicare HMO

## 2020-08-06 ENCOUNTER — Other Ambulatory Visit: Payer: Self-pay | Admitting: Critical Care Medicine

## 2020-08-06 DIAGNOSIS — Z20822 Contact with and (suspected) exposure to covid-19: Secondary | ICD-10-CM

## 2020-08-09 LAB — NOVEL CORONAVIRUS, NAA: SARS-CoV-2, NAA: NOT DETECTED

## 2020-08-19 ENCOUNTER — Ambulatory Visit: Payer: Medicare HMO | Admitting: Physician Assistant

## 2020-09-13 ENCOUNTER — Ambulatory Visit (INDEPENDENT_AMBULATORY_CARE_PROVIDER_SITE_OTHER): Payer: Medicare HMO | Admitting: Physician Assistant

## 2020-09-13 ENCOUNTER — Encounter: Payer: Self-pay | Admitting: Physician Assistant

## 2020-09-13 ENCOUNTER — Other Ambulatory Visit: Payer: Self-pay | Admitting: Physician Assistant

## 2020-09-13 ENCOUNTER — Other Ambulatory Visit: Payer: Self-pay

## 2020-09-13 VITALS — BP 105/66 | HR 77 | Temp 98.1°F | Ht 70.0 in | Wt 225.0 lb

## 2020-09-13 DIAGNOSIS — Z6832 Body mass index (BMI) 32.0-32.9, adult: Secondary | ICD-10-CM

## 2020-09-13 DIAGNOSIS — E1169 Type 2 diabetes mellitus with other specified complication: Secondary | ICD-10-CM

## 2020-09-13 DIAGNOSIS — E669 Obesity, unspecified: Secondary | ICD-10-CM | POA: Diagnosis not present

## 2020-09-13 DIAGNOSIS — E1159 Type 2 diabetes mellitus with other circulatory complications: Secondary | ICD-10-CM

## 2020-09-13 DIAGNOSIS — M5432 Sciatica, left side: Secondary | ICD-10-CM

## 2020-09-13 DIAGNOSIS — Z23 Encounter for immunization: Secondary | ICD-10-CM

## 2020-09-13 DIAGNOSIS — E785 Hyperlipidemia, unspecified: Secondary | ICD-10-CM | POA: Diagnosis not present

## 2020-09-13 DIAGNOSIS — Z794 Long term (current) use of insulin: Secondary | ICD-10-CM

## 2020-09-13 DIAGNOSIS — I152 Hypertension secondary to endocrine disorders: Secondary | ICD-10-CM | POA: Diagnosis not present

## 2020-09-13 LAB — POCT GLYCOSYLATED HEMOGLOBIN (HGB A1C): Hemoglobin A1C: 6.9 % — AB (ref 4.0–5.6)

## 2020-09-13 MED ORDER — METHYLPREDNISOLONE SODIUM SUCC 125 MG IJ SOLR
125.0000 mg | Freq: Once | INTRAMUSCULAR | Status: AC
  Administered 2020-09-13: 125 mg via INTRAMUSCULAR

## 2020-09-13 NOTE — Progress Notes (Signed)
Established Patient Office Visit  Subjective:  Patient ID: Samantha [name], female    DOB: Dec 16, 1955  Age: 64 y.o. MRN: [id number]  CC:  Chief Complaint  Patient presents with  . Diabetes  . Hyperlipidemia  . Hypertension    Samantha [name] presents for follow up on diabetes mellitus, hypertension, and hyperlipidemia.  Diabetes: Pt denies increased urination or thirst. Pt reports medication compliance. No hypoglycemic events. Checking glucose at home. FBS range 140s, one reading 180 and is unsure why. Continues to monitor carbohydrates and glucose. States has been walking more.   HTN: Pt denies chest pain, palpitations, dizziness or lower extremity swelling. Taking medication as directed without side effects. Checks BP at home and readings range 110-120s/60-70s. Pt follows a low salt diet.  HLD: Pt taking medication as directed without issues. Denies side effects. Reports she limits red meat and tries to watch what she eats.  Sciatica: States has been experiencing a sciatic flare-up of left side since Saturday. Has low back pain that radiates to buttock and thigh with some numbness of lower extremity. Has been doing stretching exercises. Denies fever, bladder or bowel dysfunction.  Past Medical History:  Diagnosis Date  . Arthritis   . Dyslipidemia   . Heart disease   . Heart murmur 2011  . Left bundle branch block 2011  . Type II or unspecified type diabetes mellitus without mention of complication, not stated as uncontrolled   . Unspecified essential hypertension     Past Surgical History:  Procedure Laterality Date  . REPLACEMENT TOTAL KNEE Left 2017  . TONSILLECTOMY  1962    Family History  Problem Relation Age of Onset  . Diabetes Mother   . Hypertension Mother   . CAD Mother   . Hypertension Father   . Diabetes Father   . CAD Father   . Diabetes Brother   . Hyperlipidemia Brother   . Hypertension Brother   . CAD Brother   . Diabetes Sister   .  Hypertension Sister     Social History   Socioeconomic History  . Marital status: Married    Spouse name: Not on file  . Number of children: Not on file  . Years of education: Not on file  . Highest education level: Not on file  Occupational History  . Not on file  Tobacco Use  . Smoking status: Never Smoker  . Smokeless tobacco: Never Used  Vaping Use  . Vaping Use: Never used  Substance and Sexual Activity  . Alcohol use: No  . Drug use: No  . Sexual activity: Not Currently  Other Topics Concern  . Not on file  Social History Narrative  . Not on file   Social Determinants of Health   Financial Resource Strain:   . Difficulty of Paying Living Expenses: Not on file  Food Insecurity:   . Worried About Charity fundraiser in the Last Year: Not on file  . Ran Out of Food in the Last Year: Not on file  Transportation Needs:   . Lack of Transportation (Medical): Not on file  . Lack of Transportation (Non-Medical): Not on file  Physical Activity:   . Days of Exercise per Week: Not on file  . Minutes of Exercise per Session: Not on file  Stress:   . Feeling of Stress : Not on file  Social Connections:   . Frequency of Communication with Friends and Family: Not on file  . Frequency of Social Gatherings with Friends  and Family: Not on file  . Attends Religious Services: Not on file  . Active Member of Clubs or Organizations: Not on file  . Attends Archivist Meetings: Not on file  . Marital Status: Not on file  Intimate Partner Violence:   . Fear of Current or Ex-Partner: Not on file  . Emotionally Abused: Not on file  . Physically Abused: Not on file  . Sexually Abused: Not on file    Outpatient Medications Prior to Visit  Medication Sig Dispense Refill  . aspirin 81 MG tablet Take 81 mg by mouth every morning.     Marland Kitchen azelastine (ASTELIN) 0.1 % nasal spray Place 1 spray into both nostrils daily as needed for allergies.     . budesonide-formoterol  (SYMBICORT) 80-4.5 MCG/ACT inhaler Inhale 2 puffs into the lungs 2 (two) times daily. 1 Inhaler 12  . Calcium Citrate-Vitamin D (CALCIUM + D PO) Take 1 tablet by mouth every morning.     . cetirizine (ZYRTEC) 10 MG tablet Take 10 mg by mouth daily.    . Cobalamin Combinations (NEURIVA PLUS PO) Take by mouth.    . diclofenac (VOLTAREN) 75 MG EC tablet Take 1 tablet by mouth twice daily 180 tablet 0  . FLUoxetine (PROZAC) 20 MG capsule Take 1 capsule by mouth once daily in the morning 90 capsule 0  . gabapentin (NEURONTIN) 300 MG capsule Take 1 capsule (300 mg total) by mouth 2 (two) times daily. 180 capsule 0  . hydrochlorothiazide (MICROZIDE) 12.5 MG capsule Take 1 capsule (12.5 mg total) by mouth daily. 90 capsule 1  . Insulin Degludec (TRESIBA) 100 UNIT/ML SOLN Inject 12 Units into the skin at bedtime.    Marland Kitchen ipratropium-albuterol (DUONEB) 0.5-2.5 (3) MG/3ML SOLN Inhale 3 mLs into the lungs daily as needed (wheezing).     Marland Kitchen lovastatin (MEVACOR) 20 MG tablet TAKE 1 TABLET BY MOUTH AT BEDTIME 90 tablet 0  . metFORMIN (GLUCOPHAGE) 1000 MG tablet Take 1 tablet (1,000 mg total) by mouth 2 (two) times daily with a meal. 180 tablet 1  . nitroGLYCERIN (NITROSTAT) 0.4 MG SL tablet Place 1 tablet (0.4 mg total) under the tongue every 5 (five) minutes as needed for chest pain. 30 tablet 12  . Omega-3 Fatty Acids (FISH OIL) 1000 MG CAPS Take 1,000 mg by mouth every morning.     . pantoprazole (PROTONIX) 40 MG tablet Take 1 tablet (40 mg total) by mouth daily. 30 tablet 11  . Semaglutide, 1 MG/DOSE, (OZEMPIC, 1 MG/DOSE,) 2 MG/1.5ML SOPN Inject 0.75 mLs (1 mg total) into the skin once a week. (use only once lower doses finished) 10 pen 0  . valsartan (DIOVAN) 160 MG tablet Take 1 tablet by mouth once daily 90 tablet 0  . verapamil (CALAN-SR) 240 MG CR tablet TAKE 1 TABLET BY MOUTH ONCE DAILY IN THE MORNING 90 tablet 0  . FLUoxetine (PROZAC) 20 MG capsule Take 1 capsule (20 mg total) by mouth every morning. 90  capsule 0  . verapamil (VERELAN PM) 240 MG 24 hr capsule Take 1 capsule (240 mg total) by mouth every morning. 90 capsule 0   No facility-administered medications prior to visit.    Allergies  Allergen Reactions  . Iohexol Anaphylaxis     Code: SOB, Desc: respiratory distress-ASM, Onset Date: 57322025   . Jardiance [Empagliflozin] Rash    ROS Review of Systems A fourteen system review of systems was performed and found to be positive as per Samantha.  Objective:  Physical Exam General:  Well Developed, well nourished, appropriate for stated age.  Neuro:  Alert and oriented,  extra-ocular muscles intact  HEENT:  Normocephalic, atraumatic, neck supple Skin:  no gross rash, warm, pink. Cardiac:  RRR, S1 S2 Respiratory:  ECTA B/L and A/P, Not using accessory muscles, speaking in full sentences- unlabored. MSK: TTP of left gluteus medius and low back, positive straight leg raise, good pedal pulses Vascular:  Ext warm, no cyanosis apprec.;  Psych:  No HI/SI, judgement and insight good, Euthymic mood. Full Affect.   BP 105/66   Pulse 77   Temp 98.1 F (36.7 C) (Oral)   Ht $R'5\' 10"'cj$  (1.778 m)   Wt 225 lb (102.1 kg)   SpO2 94% Comment: on RA  BMI 32.28 kg/m  Wt Readings from Last 3 Encounters:  09/13/20 225 lb (102.1 kg)  05/20/20 237 lb (107.5 kg)  02/23/20 244 lb (110.7 kg)     Health Maintenance Due  Topic Date Due  . OPHTHALMOLOGY EXAM  Never done  . COVID-19 Vaccine (1) Never done  . MAMMOGRAM  02/15/2018  . DEXA SCAN  Never done  . PNA vac Low Risk Adult (2 of 2 - PPSV23) 09/03/2020    There are no preventive care reminders to display for this patient.  Lab Results  Component Value Date   TSH 1.950 06/10/2020   Lab Results  Component Value Date   WBC 11.0 (H) 06/10/2020   HGB 12.4 06/10/2020   HCT 36.9 06/10/2020   MCV 82 06/10/2020   PLT 339 06/10/2020   Lab Results  Component Value Date   NA 139 06/10/2020   K 4.1 06/10/2020   CO2 26 06/10/2020    GLUCOSE 150 (H) 06/10/2020   BUN 23 06/10/2020   CREATININE 0.98 06/10/2020   BILITOT 0.5 06/10/2020   ALKPHOS 68 06/10/2020   AST 15 06/10/2020   ALT 26 06/10/2020   PROT 7.1 06/10/2020   ALBUMIN 4.2 06/10/2020   CALCIUM 9.1 06/10/2020   ANIONGAP 10 08/10/2019   Lab Results  Component Value Date   CHOL 121 06/10/2020   Lab Results  Component Value Date   HDL 52 06/10/2020   Lab Results  Component Value Date   LDLCALC 47 06/10/2020   Lab Results  Component Value Date   TRIG 122 06/10/2020   Lab Results  Component Value Date   CHOLHDL 2.3 06/10/2020   Lab Results  Component Value Date   HGBA1C 6.9 (A) 09/13/2020      Assessment & Plan:   Problem List Items Addressed This Visit      Cardiovascular and Mediastinum   Hypertension associated with diabetes ([place])    -BP stable -Continue current medication regimen. -Continue low-sodium diet. -Will continue to monitor.        Endocrine   Diabetes ([place]) - Primary    -A1c improved from 7.5 to 6.9 -Continue current medication regimen. -Continue low carbohydrate and glucose diet. -Will continue to monitor.      Relevant Orders   POCT glycosylated hemoglobin (Hb A1C) (Completed)   Hyperlipidemia associated with type 2 diabetes mellitus ([facility])    -Last lipid panel WNL, LDL 42 -Continue current medication regimen. -Follow heart healthy diet. -Continue to stay active. -Will continue to monitor.       Other Visit Diagnoses    Need for influenza vaccination       Relevant Orders   Flu Vaccine QUAD High Dose(Fluad) (Completed)   Sciatica of left side  Relevant Medications   methylPREDNISolone sodium succinate (SOLU-MEDROL) 125 mg/2 mL injection 125 mg (Completed)   Class 1 obesity with serious comorbidity and body mass index (BMI) of 32.0 to 32.9 in adult, unspecified obesity type         Class I obesity with serious comorbidity and body mass index (BMI) of 32.0 to 32.9 in adult, unspecified obesity  type: -Associated with diabetes mellitus, hypertension and hyperlipidemia. -Recommend to continue with physical activity and dietary modifications.  Sciatica of left side: -Will place order for Solu-Medrol to help with exacerbation and discussed with patient glucocorticoids can increase glucose and will likely have higher glucose readings. -Recommend to continue with stretching exercises and apply heat.  Meds ordered this encounter  Medications  . methylPREDNISolone sodium succinate (SOLU-MEDROL) 125 mg/2 mL injection 125 mg    Follow-up: Return in about 4 months (around 01/14/2021) for DM, HTN, HLD.   Note:  This note was prepared with assistance of Dragon voice recognition software. Occasional wrong-word or sound-a-like substitutions may have occurred due to the inherent limitations of voice recognition software.  Lorrene Reid, PA-C

## 2020-09-13 NOTE — Assessment & Plan Note (Signed)
-  Last lipid panel WNL, LDL 42 -Continue current medication regimen. -Follow heart healthy diet. -Continue to stay active. -Will continue to monitor.

## 2020-09-13 NOTE — Patient Instructions (Addendum)
Diabetes Mellitus and Foot Care Foot care is an important part of your health, especially when you have diabetes. Diabetes may cause you to have problems because of poor blood flow (circulation) to your feet and legs, which can cause your skin to:  Become thinner and drier.  Break more easily.  Heal more slowly.  Peel and crack. You may also have nerve damage (neuropathy) in your legs and feet, causing decreased feeling in them. This means that you may not notice minor injuries to your feet that could lead to more serious problems. Noticing and addressing any potential problems early is the best way to prevent future foot problems. How to care for your feet Foot hygiene  Wash your feet daily with warm water and mild soap. Do not use hot water. Then, pat your feet and the areas between your toes until they are completely dry. Do not soak your feet as this can dry your skin.  Trim your toenails straight across. Do not dig under them or around the cuticle. File the edges of your nails with an emery board or nail file.  Apply a moisturizing lotion or petroleum jelly to the skin on your feet and to dry, brittle toenails. Use lotion that does not contain alcohol and is unscented. Do not apply lotion between your toes. Shoes and socks  Wear clean socks or stockings every day. Make sure they are not too tight. Do not wear knee-high stockings since they may decrease blood flow to your legs.  Wear shoes that fit properly and have enough cushioning. Always look in your shoes before you put them on to be sure there are no objects inside.  To break in new shoes, wear them for just a few hours a day. This prevents injuries on your feet. Wounds, scrapes, corns, and calluses  Check your feet daily for blisters, cuts, bruises, sores, and redness. If you cannot see the bottom of your feet, use a mirror or ask someone for help.  Do not cut corns or calluses or try to remove them with medicine.  If you  find a minor scrape, cut, or break in the skin on your feet, keep it and the skin around it clean and dry. You may clean these areas with mild soap and water. Do not clean the area with peroxide, alcohol, or iodine.  If you have a wound, scrape, corn, or callus on your foot, look at it several times a day to make sure it is healing and not infected. Check for: ? Redness, swelling, or pain. ? Fluid or blood. ? Warmth. ? Pus or a bad smell. General instructions  Do not cross your legs. This may decrease blood flow to your feet.  Do not use heating pads or hot water bottles on your feet. They may burn your skin. If you have lost feeling in your feet or legs, you may not know this is happening until it is too late.  Protect your feet from hot and cold by wearing shoes, such as at the beach or on hot pavement.  Schedule a complete foot exam at least once a year (annually) or more often if you have foot problems. If you have foot problems, report any cuts, sores, or bruises to your health care provider immediately. Contact a health care provider if:  You have a medical condition that increases your risk of infection and you have any cuts, sores, or bruises on your feet.  You have an injury that is not   healing.  You have redness on your legs or feet.  You feel burning or tingling in your legs or feet.  You have pain or cramps in your legs and feet.  Your legs or feet are numb.  Your feet always feel cold.  You have pain around a toenail. Get help right away if:  You have a wound, scrape, corn, or callus on your foot and: ? You have pain, swelling, or redness that gets worse. ? You have fluid or blood coming from the wound, scrape, corn, or callus. ? Your wound, scrape, corn, or callus feels warm to the touch. ? You have pus or a bad smell coming from the wound, scrape, corn, or callus. ? You have a fever. ? You have a red line going up your leg. Summary  Check your feet every day  for cuts, sores, red spots, swelling, and blisters.  Moisturize feet and legs daily.  Wear shoes that fit properly and have enough cushioning.  If you have foot problems, report any cuts, sores, or bruises to your health care provider immediately.  Schedule a complete foot exam at least once a year (annually) or more often if you have foot problems. This information is not intended to replace advice given to you by your health care provider. Make sure you discuss any questions you have with your health care provider. Document Revised: 08/06/2019 Document Reviewed: 12/15/2016 Elsevier Patient Education  [place].    Sciatica  Sciatica is pain, weakness, tingling, or loss of feeling (numbness) along the sciatic nerve. The sciatic nerve starts in the lower back and goes down the back of each leg. Sciatica usually goes away on its own or with treatment. Sometimes, sciatica may come back (recur). What are the causes? This condition happens when the sciatic nerve is pinched or has pressure put on it. This may be the result of:  A disk in between the bones of the spine bulging out too far (herniated disk).  Changes in the spinal disks that occur with aging.  A condition that affects a muscle in the butt.  Extra bone growth near the sciatic nerve.  A break (fracture) of the area between your hip bones (pelvis).  Pregnancy.  Tumor. This is rare. What increases the risk? You are more likely to develop this condition if you:  Play sports that put pressure or stress on the spine.  Have poor strength and ease of movement (flexibility).  Have had a back injury in the past.  Have had back surgery.  Sit for long periods of time.  Do activities that involve bending or lifting over and over again.  Are very overweight (obese). What are the signs or symptoms? Symptoms can vary from mild to very bad. They may include:  Any of these problems in the lower back, leg, hip, or  butt: ? Mild tingling, loss of feeling, or dull aches. ? Burning sensations. ? Sharp pains.  Loss of feeling in the back of the calf or the sole of the foot.  Leg weakness.  Very bad back pain that makes it hard to move. These symptoms may get worse when you cough, sneeze, or laugh. They may also get worse when you sit or stand for long periods of time. How is this treated? This condition often gets better without any treatment. However, treatment may include:  Changing or cutting back on physical activity when you have pain.  Doing exercises and stretching.  Putting ice or heat  on the affected area.  Medicines that help: ? To relieve pain and swelling. ? To relax your muscles.  Shots (injections) of medicines that help to relieve pain, irritation, and swelling.  Surgery. Follow these instructions at home: Medicines  Take over-the-counter and prescription medicines only as told by your doctor.  Ask your doctor if the medicine prescribed to you: ? Requires you to avoid driving or using heavy machinery. ? Can cause trouble pooping (constipation). You may need to take these steps to prevent or treat trouble pooping:  Drink enough fluids to keep your pee (urine) pale yellow.  Take over-the-counter or prescription medicines.  Eat foods that are high in fiber. These include beans, whole grains, and fresh fruits and vegetables.  Limit foods that are high in fat and sugar. These include fried or sweet foods. Managing pain      If told, put ice on the affected area. ? Put ice in a plastic bag. ? Place a towel between your skin and the bag. ? Leave the ice on for 20 minutes, 2-3 times a day.  If told, put heat on the affected area. Use the heat source that your doctor tells you to use, such as a moist heat pack or a heating pad. ? Place a towel between your skin and the heat source. ? Leave the heat on for 20-30 minutes. ? Remove the heat if your skin turns bright red.  This is very important if you are unable to feel pain, heat, or cold. You may have a greater risk of getting burned. Activity   Return to your normal activities as told by your doctor. Ask your doctor what activities are safe for you.  Avoid activities that make your symptoms worse.  Take short rests during the day. ? When you rest for a long time, do some physical activity or stretching between periods of rest. ? Avoid sitting for a long time without moving. Get up and move around at least one time each hour.  Exercise and stretch regularly, as told by your doctor.  Do not lift anything that is heavier than 10 lb (4.5 kg) while you have symptoms of sciatica. ? Avoid lifting heavy things even when you do not have symptoms. ? Avoid lifting heavy things over and over.  When you lift objects, always lift in a way that is safe for your body. To do this, you should: ? Bend your knees. ? Keep the object close to your body. ? Avoid twisting. General instructions  Stay at a healthy weight.  Wear comfortable shoes that support your feet. Avoid wearing high heels.  Avoid sleeping on a mattress that is too soft or too hard. You might have less pain if you sleep on a mattress that is firm enough to support your back.  Keep all follow-up visits as told by your doctor. This is important. Contact a doctor if:  You have pain that: ? Wakes you up when you are sleeping. ? Gets worse when you lie down. ? Is worse than the pain you have had in the past. ? Lasts longer than 4 weeks.  You lose weight without trying. Get help right away if:  You cannot control when you pee (urinate) or poop (have a bowel movement).  You have weakness in any of these areas and it gets worse: ? Lower back. ? The area between your hip bones. ? Butt. ? Legs.  You have redness or swelling of your back.  You have  a burning feeling when you pee. Summary  Sciatica is pain, weakness, tingling, or loss of  feeling (numbness) along the sciatic nerve.  This condition happens when the sciatic nerve is pinched or has pressure put on it.  Sciatica can cause pain, tingling, or loss of feeling (numbness) in the lower back, legs, hips, and butt.  Treatment often includes rest, exercise, medicines, and putting ice or heat on the affected area. This information is not intended to replace advice given to you by your health care provider. Make sure you discuss any questions you have with your health care provider. Document Revised: 12/02/2018 Document Reviewed: 12/02/2018 Elsevier Patient Education  [place].

## 2020-09-13 NOTE — Assessment & Plan Note (Signed)
>>  ASSESSMENT AND PLAN FOR DIABETES WRITTEN ON 09/13/2020  3:19 PM BY ABONZA, MARITZA, PA-C  -A1c improved from 7.5 to 6.9 -Continue current medication regimen. -Continue low carbohydrate and glucose diet. -Will continue to monitor.

## 2020-09-13 NOTE — Assessment & Plan Note (Signed)
-  A1c improved from 7.5 to 6.9 -Continue current medication regimen. -Continue low carbohydrate and glucose diet. -Will continue to monitor.

## 2020-09-13 NOTE — Assessment & Plan Note (Signed)
-  BP stable -Continue current medication regimen. -Continue low-sodium diet. -Will continue to monitor.

## 2020-10-05 ENCOUNTER — Other Ambulatory Visit: Payer: Self-pay | Admitting: Physician Assistant

## 2020-10-13 ENCOUNTER — Other Ambulatory Visit: Payer: Self-pay | Admitting: Physician Assistant

## 2020-10-18 ENCOUNTER — Other Ambulatory Visit: Payer: Self-pay

## 2020-10-18 ENCOUNTER — Other Ambulatory Visit: Payer: Self-pay | Admitting: Physician Assistant

## 2020-10-18 MED ORDER — TRESIBA 100 UNIT/ML ~~LOC~~ SOLN: 12.0000 [IU] | Freq: Every day | SUBCUTANEOUS | 0 refills | Status: DC

## 2020-10-18 NOTE — Telephone Encounter (Signed)
We have not prescribed these medications for the patient previously.  Please review and refill if appropriate.  T. Raevin Wierenga, CMA  

## 2020-10-19 ENCOUNTER — Telehealth: Payer: Self-pay | Admitting: Physician Assistant

## 2020-10-19 MED ORDER — INSULIN DEGLUDEC 100 UNIT/ML ~~LOC~~ SOPN: 12.0000 [IU] | PEN_INJECTOR | Freq: Every day | SUBCUTANEOUS | 0 refills | Status: DC

## 2020-10-19 NOTE — Telephone Encounter (Signed)
Spoke with Deidre at [facility].  RX sent for Tyler Aas was sent for vials rather than pens which is what the patient has been getting.  Pharmacy is requesting a new RX for pens.  New RX sent to pharmacy.  Charyl Bigger, CMA

## 2020-10-19 NOTE — Addendum Note (Signed)
Addended by: Fonnie Mu on: 10/19/2020 03:54 PM   Modules accepted: Orders

## 2020-10-19 NOTE — Telephone Encounter (Signed)
Pharmacy called stating Samantha [name] had changed from pen to vials. Please advise.Thanks

## 2020-10-20 ENCOUNTER — Other Ambulatory Visit: Payer: Self-pay | Admitting: Physician Assistant

## 2020-10-20 DIAGNOSIS — E1169 Type 2 diabetes mellitus with other specified complication: Secondary | ICD-10-CM

## 2020-10-27 ENCOUNTER — Other Ambulatory Visit: Payer: Self-pay | Admitting: Physician Assistant

## 2020-11-12 ENCOUNTER — Other Ambulatory Visit: Payer: Self-pay

## 2020-11-12 ENCOUNTER — Ambulatory Visit
Admission: EM | Admit: 2020-11-12 | Discharge: 2020-11-12 | Disposition: A | Discharge status: Home / Self Care | Payer: Medicare HMO | Attending: Urgent Care | Admitting: Urgent Care

## 2020-11-12 DIAGNOSIS — H6983 Other specified disorders of Eustachian tube, bilateral: Secondary | ICD-10-CM | POA: Insufficient documentation

## 2020-11-12 DIAGNOSIS — H9201 Otalgia, right ear: Secondary | ICD-10-CM | POA: Insufficient documentation

## 2020-11-12 DIAGNOSIS — H7291 Unspecified perforation of tympanic membrane, right ear: Secondary | ICD-10-CM | POA: Insufficient documentation

## 2020-11-12 DIAGNOSIS — J069 Acute upper respiratory infection, unspecified: Secondary | ICD-10-CM | POA: Insufficient documentation

## 2020-11-12 LAB — POCT RAPID STREP A (OFFICE): Rapid Strep A Screen: NEGATIVE

## 2020-11-12 MED ORDER — FLUTICASONE PROPIONATE 50 MCG/ACT NA SUSP: 2.0000 | Freq: Every day | NASAL | 0 refills | Status: DC

## 2020-11-12 NOTE — ED Triage Notes (Signed)
Pt c/o rt sided sore throat and rt ear pain since Monday. Denies cough or congestion.

## 2020-11-12 NOTE — Discharge Instructions (Addendum)
We will manage this for lateral respiratory infection likely worsened by her chronic conditions of allergies and eustachian tube dysfunction.  Please maintain your cetirizine, azelastine nasal spray.  You can use Tylenol at a dose of 500mg -650mg  once every 6 hours as needed for your aches, pains, fevers.  Do not use any nonsteroidal anti-inflammatories (NSAIDs) like ibuprofen, Motrin, naproxen, Aleve, etc. which are all available over-the-counter.  I will let you know if your strep culture turns out to be positive.

## 2020-11-12 NOTE — ED Provider Notes (Signed)
[place]   MRN: [id number] DOB: 14-Nov-1956  Subjective:   Samantha [name] is a 64 y.o. female presenting for 5-day history of acute onset and persistent throat pain, right ear pain.  She started to have hoarseness painful swallowing today.  Denies fever, runny or stuffy nose, cough, chest pain, shortness of breath.  Patient is Covid vaccinated, does not want to be tested.  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  She has not taken any medications for her symptoms.  She is using azelastine nasal spray.  No current facility-administered medications for this encounter.  Current Outpatient Medications:  .  aspirin 81 MG tablet, Take 81 mg by mouth every morning. , Disp: , Rfl:  .  azelastine (ASTELIN) 0.1 % nasal spray, Place 1 spray into both nostrils daily as needed for allergies. , Disp: , Rfl:  .  budesonide-formoterol (SYMBICORT) 80-4.5 MCG/ACT inhaler, Inhale 2 puffs into the lungs 2 (two) times daily., Disp: 1 Inhaler, Rfl: 12 .  Calcium Citrate-Vitamin D (CALCIUM + D PO), Take 1 tablet by mouth every morning. , Disp: , Rfl:  .  cetirizine (ZYRTEC) 10 MG tablet, Take 10 mg by mouth daily., Disp: , Rfl:  .  Cobalamin Combinations (NEURIVA PLUS PO), Take by mouth., Disp: , Rfl:  .  diclofenac (VOLTAREN) 75 MG EC tablet, Take 1 tablet by mouth twice daily, Disp: 180 tablet, Rfl: 0 .  FLUoxetine (PROZAC) 20 MG capsule, Take 1 capsule by mouth once daily in the morning, Disp: 90 capsule, Rfl: 0 .  gabapentin (NEURONTIN) 300 MG capsule, Take 1 capsule by mouth twice daily, Disp: 180 capsule, Rfl: 0 .  hydrochlorothiazide (MICROZIDE) 12.5 MG capsule, Take 1 capsule (12.5 mg total) by mouth daily., Disp: 90 capsule, Rfl: 1 .  insulin degludec (TRESIBA) 100 UNIT/ML FlexTouch Pen, Inject 12 Units into the skin daily., Disp: 15 mL, Rfl: 0 .  ipratropium-albuterol (DUONEB) 0.5-2.5  (3) MG/3ML SOLN, Inhale 3 mLs into the lungs daily as needed (wheezing). , Disp: , Rfl:  .  lovastatin (MEVACOR) 20 MG tablet, TAKE 1 TABLET BY MOUTH AT BEDTIME, Disp: 90 tablet, Rfl: 0 .  metFORMIN (GLUCOPHAGE) 1000 MG tablet, TAKE 1 TABLET BY MOUTH TWICE DAILY WITH A MEAL, Disp: 180 tablet, Rfl: 0 .  nitroGLYCERIN (NITROSTAT) 0.4 MG SL tablet, Place 1 tablet (0.4 mg total) under the tongue every 5 (five) minutes as needed for chest pain., Disp: 30 tablet, Rfl: 12 .  Omega-3 Fatty Acids (FISH OIL) 1000 MG CAPS, Take 1,000 mg by mouth every morning. , Disp: , Rfl:  .  pantoprazole (PROTONIX) 40 MG tablet, Take 1 tablet (40 mg total) by mouth daily., Disp: 30 tablet, Rfl: 11 .  Semaglutide, 1 MG/DOSE, (OZEMPIC, 1 MG/DOSE,) 2 MG/1.5ML SOPN, Inject 0.75 mLs (1 mg total) into the skin once a week. (use only once lower doses finished), Disp: 10 pen, Rfl: 0 .  valsartan (DIOVAN) 160 MG tablet, Take 1 tablet by mouth once daily, Disp: 90 tablet, Rfl: 0 .  verapamil (CALAN-SR) 240 MG CR tablet, TAKE 1 TABLET BY MOUTH ONCE DAILY IN THE MORNING, Disp: 90 tablet, Rfl: 0   Allergies  Allergen Reactions  . Iohexol Anaphylaxis     Code: SOB, Desc: respiratory distress-ASM, Onset Date: [id number]   . Jardiance [Empagliflozin] Rash    Past Medical History:  Diagnosis Date  . Arthritis   . Dyslipidemia   .  Heart disease   . Heart murmur 2011  . Left bundle branch block 2011  . Type II or unspecified type diabetes mellitus without mention of complication, not stated as uncontrolled   . Unspecified essential hypertension      Past Surgical History:  Procedure Laterality Date  . REPLACEMENT TOTAL KNEE Left 2017  . TONSILLECTOMY  1962    Family History  Problem Relation Age of Onset  . Diabetes Mother   . Hypertension Mother   . CAD Mother   . Hypertension Father   . Diabetes Father   . CAD Father   . Diabetes Brother   . Hyperlipidemia Brother   . Hypertension Brother   . CAD Brother   .  Diabetes Sister   . Hypertension Sister     Social History   Tobacco Use  . Smoking status: Never Smoker  . Smokeless tobacco: Never Used  Vaping Use  . Vaping Use: Never used  Substance Use Topics  . Alcohol use: No  . Drug use: No    ROS   Objective:   Vitals: BP 125/78 (BP Location: Right Arm)   Pulse 75   Temp 97.8 F (36.6 C) (Oral)   Resp 20   SpO2 94%   Physical Exam Constitutional:      General: She is not in acute distress.    Appearance: She is well-developed. She is not ill-appearing.  HENT:     Head: Normocephalic and atraumatic.     Right Ear: Ear canal normal. No drainage or tenderness. No middle ear effusion. Tympanic membrane is not erythematous.     Left Ear: Tympanic membrane and ear canal normal. No drainage or tenderness.  No middle ear effusion. Tympanic membrane is not erythematous.     Ears:     Comments: Chronic perforation of the right TM noted.      Nose: No congestion or rhinorrhea.     Mouth/Throat:     Mouth: Mucous membranes are moist. No oral lesions.     Pharynx: No pharyngeal swelling, oropharyngeal exudate, posterior oropharyngeal erythema or uvula swelling.     Tonsils: No tonsillar exudate or tonsillar abscesses.     Comments: Tonsils are absent. Eyes:     Extraocular Movements:     Right eye: Normal extraocular motion.     Left eye: Normal extraocular motion.     Conjunctiva/sclera: Conjunctivae normal.     Pupils: Pupils are equal, round, and reactive to light.  Cardiovascular:     Rate and Rhythm: Normal rate.  Pulmonary:     Effort: Pulmonary effort is normal.  Musculoskeletal:     Cervical back: Normal range of motion and neck supple.  Lymphadenopathy:     Cervical: No cervical adenopathy.  Skin:    General: Skin is warm and dry.  Neurological:     General: No focal deficit present.     Mental Status: She is alert and oriented to person, place, and time.  Psychiatric:        Mood and Affect: Mood normal.         Behavior: Behavior normal.     Results for orders placed or performed during the hospital encounter of 11/12/20 (from the past 24 hour(s))  POCT rapid strep A     Status: None   Collection Time: 11/12/20  7:22 PM  Result Value Ref Range   Rapid Strep A Screen Negative Negative    Assessment and Plan :   PDMP not reviewed this  encounter.  1. Right ear pain   2. Eustachian tube dysfunction, bilateral   3. Perforation of right tympanic membrane     Throat culture pending.  Patient declined COVID-19 testing.  Recommended conservative management with supportive care, Tylenol, Flonase, fluids maintain Zyrtec. Counseled patient on potential for adverse effects with medications prescribed/recommended today, ER and return-to-clinic precautions discussed, patient verbalized understanding.    [name], PA-C 11/12/20 1956

## 2020-11-15 ENCOUNTER — Encounter: Payer: Self-pay | Admitting: Physician Assistant

## 2020-11-15 ENCOUNTER — Other Ambulatory Visit: Payer: Self-pay

## 2020-11-15 ENCOUNTER — Ambulatory Visit (INDEPENDENT_AMBULATORY_CARE_PROVIDER_SITE_OTHER): Payer: Medicare HMO | Admitting: Physician Assistant

## 2020-11-15 VITALS — Temp 99.1°F

## 2020-11-15 DIAGNOSIS — J069 Acute upper respiratory infection, unspecified: Secondary | ICD-10-CM

## 2020-11-15 DIAGNOSIS — H1031 Unspecified acute conjunctivitis, right eye: Secondary | ICD-10-CM | POA: Diagnosis not present

## 2020-11-15 MED ORDER — POLYMYXIN B-TRIMETHOPRIM 10000-0.1 UNIT/ML-% OP SOLN: OPHTHALMIC | 0 refills | Status: DC

## 2020-11-15 MED ORDER — AMOXICILLIN-POT CLAVULANATE 875-125 MG PO TABS: 1.0000 | ORAL_TABLET | Freq: Two times a day (BID) | ORAL | 0 refills | Status: DC

## 2020-11-15 MED ORDER — BENZONATATE 100 MG PO CAPS: 100.0000 mg | ORAL_CAPSULE | Freq: Three times a day (TID) | ORAL | 0 refills | Status: DC | PRN

## 2020-11-15 NOTE — Progress Notes (Signed)
Telehealth office visit note for Samantha Reid, PA-C- at Primary Care at [facility]   I connected with current patient today by telephone and verified that I am speaking with the correct person   . Location of the patient: Home . Location of the provider: Office - This visit type was conducted due to national recommendations for restrictions regarding the COVID-19 Pandemic (e.g. social distancing) in an effort to limit this patient's exposure and mitigate transmission in our community.    - No physical exam could be performed with this format, beyond that communicated to [place] by the patient/ family members as noted.   - Additionally my office staff/ schedulers were to discuss with the patient that there may be a monetary charge related to this service, depending on their medical insurance.  My understanding is that patient understood and consented to proceed.     _________________________________________________________________________________   History of Present Illness: Patient calls in with complaints of sore throat, right earache, sinus pressure, nasal congestion with green mucus, dry cough and states this morning her right eye was red with clear and green drainage, ey was matted together. States yesterday had some eye pain.  Symptoms started 7 days ago with hoarseness and have gradually progressed. Reports a low-grade fever 2 days ago.  Denies chest congestion, chills, sick contact exposures, GI symptoms, or shortness of breath.  Has only taken Tylenol for fever.     GAD 7 : Generalized Anxiety Score 02/23/2020  Nervous, Anxious, on Edge 0  Control/stop worrying 0  Worry too much - different things 1  Trouble relaxing 0  Restless 1  Easily annoyed or irritable 1  Afraid - awful might happen 0  Total GAD 7 Score 3  Anxiety Difficulty Somewhat difficult    Depression screen [facility] 2/9 09/13/2020 05/20/2020 02/23/2020 11/18/2016 02/28/2016  Decreased Interest 0 0 1 0 0  Down,  Depressed, Hopeless 0 0 1 0 0  PHQ - 2 Score 0 0 2 0 0  Altered sleeping 1 0 1 - -  Tired, decreased energy 1 0 2 - -  Change in appetite 1 0 1 - -  Feeling bad or failure about yourself  0 0 1 - -  Trouble concentrating 0 0 1 - -  Moving slowly or fidgety/restless 0 0 0 - -  Suicidal thoughts 0 0 0 - -  PHQ-9 Score 3 0 8 - -  Difficult doing work/chores Somewhat difficult - Very difficult - -      Impression and Recommendations:     1. Upper respiratory tract infection, unspecified type   2. Acute conjunctivitis of right eye, unspecified acute conjunctivitis type     Upper respiratory tract infection, unspecified type: -Due to severity and progression of symptoms will start antibiotic therapy with Augmentin which will also cover for possible right ear infection. -Recommend to continue with home supportive therapy with Tylenol prn, drink warm liquids/honey, use a humidifier, get plenty of rest and use cough lozenges.  Will send Tessalon Perles for cough. -If symptoms fail to improve or worsen recommend imaging studies with chest x-ray and/or influenza and Covid testing.  Acute conjunctivitis of right eye, unspecified acute conjunctivitis type: -Discussed with patient symptoms are suggestive of possible bacterial conjunctivitis so will send topical antibiotic. -If symptoms fail to improve or worsen recommend in office evaluation.   - As part of my medical decision making, I reviewed the following data within the [place] History  obtained from pt /family, CMA notes reviewed and incorporated if applicable, Labs reviewed, Radiograph/ tests reviewed if applicable and OV notes from prior OV's with me, as well as any other specialists she/he has seen since seeing me last, were all reviewed and used in my medical decision making process today.    - Additionally, when appropriate, discussion had with patient regarding our treatment plan, and their biases/concerns about that  plan were used in my medical decision making today.    - The patient agreed with the plan and demonstrated an understanding of the instructions.   No barriers to understanding were identified.     - The patient was advised to call back or seek an in-person evaluation if the symptoms worsen or if the condition fails to improve as anticipated.   Return if symptoms worsen or fail to improve.    No orders of the defined types were placed in this encounter.   Meds ordered this encounter  Medications  . amoxicillin-clavulanate (AUGMENTIN) 875-125 MG tablet    Sig: Take 1 tablet by mouth 2 (two) times daily.    Dispense:  14 tablet    Refill:  0    Order Specific Question:   Supervising Provider    Answer:   Beatrice Lecher D [[id number]]  . benzonatate (TESSALON PERLES) 100 MG capsule    Sig: Take 1 capsule (100 mg total) by mouth 3 (three) times daily as needed for cough.    Dispense:  20 capsule    Refill:  0    Order Specific Question:   Supervising Provider    Answer:   Beatrice Lecher D [[id number]]  . trimethoprim-polymyxin b (POLYTRIM) ophthalmic solution    Sig: Plae 2 drops into the right eye every 6 (six) hours x 5 days.    Dispense:  10 mL    Refill:  0    Order Specific Question:   Supervising Provider    Answer:   Beatrice Lecher D [[id number]]    There are no discontinued medications.     Time spent on visit including pre-visit chart review and post-visit care was 12 minutes.      The [place] was signed into law in 2016 which includes the topic of electronic health records.  This provides immediate access to information in MyChart.  This includes consultation notes, operative notes, office notes, lab results and pathology reports.  If you have any questions about what you read please let us know at your next visit or call us at the office.  We are right here with you.  Note:  This note was prepared with assistance of Dragon voice recognition software.  Occasional wrong-word or sound-a-like substitutions may have occurred due to the inherent limitations of voice recognition software.  __________________________________________________________________________________     Patient Care Team    Relationship Specialty Notifications Start End  Samantha [name], [place] PCP - General   03/28/20   Nahser, Wonda Cheng, MD Consulting Physician Cardiology  02/23/20   Collene Gobble, MD Consulting Physician Pulmonary Disease  02/23/20   [facility]    02/23/20   Carol Ada, MD Consulting Physician Gastroenterology  02/23/20   Pa, [place]  02/23/20   [facility]    02/23/20      -Vitals obtained; medications/ allergies reconciled;  personal medical, social, Sx etc.histories were updated by CMA, reviewed by me and are reflected in chart   Patient Active Problem List  Diagnosis Date Noted  . Hyperlipidemia associated with type 2 diabetes mellitus ([place]) 02/23/2020  . Microalbuminuria due to type 2 diabetes mellitus ([place]) 02/23/2020  . Type 2 diabetes mellitus with morbid obesity ([place]) 02/23/2020  . B12 deficiency 02/23/2020  . Vitamin D deficiency 02/23/2020  . Family history of heart disease 02/23/2020  . Family history of diabetes mellitus (DM) 02/23/2020  . Family history of colon cancer-grandmother 02/23/2020  . Family history of mixed hyperlipidemia 02/23/2020  . Family history of hypertension 02/23/2020  . Mood disorder ([place]) 02/23/2020  . Degenerative disc disease, lumbar 07/19/2018  . 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  . Spondylosis of lumbar spine 07/19/2018  . Lumbar radiculopathy 07/05/2018  . 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 Eustachian tube dysfunction, bilateral 02/21/2018  . Recurrent sinusitis 02/21/2018  . Tympanic membrane retraction, left 02/21/2018  . GERD (gastroesophageal reflux  disease) 10/24/2017  . Cough 08/14/2017  . Dyspnea 08/14/2017  . Pulmonary nodule, right 08/14/2017  . Back pain 07/12/2017  . Obesity (BMI 30-39.9) 06/11/2017  . LBBB (left bundle branch block) 08/10/2016  . Leukocytosis 08/10/2016  . Hypertension associated with diabetes ([place]) 08/10/2016  . Pain in the chest 08/09/2016  . Diabetes ([place]) 11/20/2015     Current Meds  Medication Sig  . aspirin 81 MG tablet Take 81 mg by mouth every morning.   Marland Kitchen azelastine (ASTELIN) 0.1 % nasal spray Place 1 spray into both nostrils daily as needed for allergies.   . budesonide-formoterol (SYMBICORT) 80-4.5 MCG/ACT inhaler Inhale 2 puffs into the lungs 2 (two) times daily.  . Calcium Citrate-Vitamin D (CALCIUM + D PO) Take 1 tablet by mouth every morning.   . cetirizine (ZYRTEC) 10 MG tablet Take 10 mg by mouth daily.  . Cobalamin Combinations (NEURIVA PLUS PO) Take by mouth.  . diclofenac (VOLTAREN) 75 MG EC tablet Take 1 tablet by mouth twice daily  . FLUoxetine (PROZAC) 20 MG capsule Take 1 capsule by mouth once daily in the morning  . fluticasone (FLONASE) 50 MCG/ACT nasal spray Place 2 sprays into both nostrils daily.  Marland Kitchen gabapentin (NEURONTIN) 300 MG capsule Take 1 capsule by mouth twice daily  . hydrochlorothiazide (MICROZIDE) 12.5 MG capsule Take 1 capsule (12.5 mg total) by mouth daily.  . insulin degludec (TRESIBA) 100 UNIT/ML FlexTouch Pen Inject 12 Units into the skin daily.  Marland Kitchen ipratropium-albuterol (DUONEB) 0.5-2.5 (3) MG/3ML SOLN Inhale 3 mLs into the lungs daily as needed (wheezing).   Marland Kitchen lovastatin (MEVACOR) 20 MG tablet TAKE 1 TABLET BY MOUTH AT BEDTIME  . metFORMIN (GLUCOPHAGE) 1000 MG tablet TAKE 1 TABLET BY MOUTH TWICE DAILY WITH A MEAL  . nitroGLYCERIN (NITROSTAT) 0.4 MG SL tablet Place 1 tablet (0.4 mg total) under the tongue every 5 (five) minutes as needed for chest pain.  . Omega-3 Fatty Acids (FISH OIL) 1000 MG CAPS Take 1,000 mg by mouth every morning.   . pantoprazole  (PROTONIX) 40 MG tablet Take 1 tablet (40 mg total) by mouth daily.  . Semaglutide, 1 MG/DOSE, (OZEMPIC, 1 MG/DOSE,) 2 MG/1.5ML SOPN Inject 0.75 mLs (1 mg total) into the skin once a week. (use only once lower doses finished)  . valsartan (DIOVAN) 160 MG tablet Take 1 tablet by mouth once daily  . verapamil (CALAN-SR) 240 MG CR tablet TAKE 1 TABLET BY MOUTH ONCE DAILY IN THE MORNING     Allergies:  Allergies  Allergen Reactions  . Iohexol Anaphylaxis  Code: SOB, Desc: respiratory distress-ASM, Onset Date: 54008676   . Jardiance [Empagliflozin] Rash     ROS:  See above HPI for pertinent positives and negatives   Objective:   Temperature 99.1 F (37.3 C), temperature source Oral.  (if some vitals are omitted, this means that patient was UNABLE to obtain them even though they were asked to get them prior to OV today.  They were asked to call us at their earliest convenience with these once obtained. ) General: A & O * 3; sounds in no acute distress  Respiratory: speaking in full sentences, no conversational dyspnea Psych: insight appears good, mood- appears full

## 2020-11-16 DIAGNOSIS — Z03818 Encounter for observation for suspected exposure to other biological agents ruled out: Secondary | ICD-10-CM | POA: Diagnosis not present

## 2020-11-17 LAB — CULTURE, GROUP A STREP (THRC)

## 2020-12-01 ENCOUNTER — Other Ambulatory Visit: Payer: Self-pay | Admitting: Physician Assistant

## 2020-12-22 ENCOUNTER — Other Ambulatory Visit: Payer: Self-pay | Admitting: Physician Assistant

## 2021-01-14 ENCOUNTER — Encounter: Payer: Self-pay | Admitting: Physician Assistant

## 2021-01-14 ENCOUNTER — Ambulatory Visit (INDEPENDENT_AMBULATORY_CARE_PROVIDER_SITE_OTHER): Payer: Medicare HMO | Admitting: Physician Assistant

## 2021-01-14 ENCOUNTER — Other Ambulatory Visit: Payer: Self-pay | Admitting: Physician Assistant

## 2021-01-14 ENCOUNTER — Other Ambulatory Visit: Payer: Self-pay

## 2021-01-14 VITALS — BP 103/65 | HR 69 | Temp 97.5°F | Ht 70.0 in | Wt 221.5 lb

## 2021-01-14 DIAGNOSIS — Z860100 Personal history of colon polyps, unspecified: Secondary | ICD-10-CM | POA: Insufficient documentation

## 2021-01-14 DIAGNOSIS — E1169 Type 2 diabetes mellitus with other specified complication: Secondary | ICD-10-CM | POA: Diagnosis not present

## 2021-01-14 DIAGNOSIS — H6980 Other specified disorders of Eustachian tube, unspecified ear: Secondary | ICD-10-CM | POA: Insufficient documentation

## 2021-01-14 DIAGNOSIS — R1032 Left lower quadrant pain: Secondary | ICD-10-CM | POA: Insufficient documentation

## 2021-01-14 DIAGNOSIS — I152 Hypertension secondary to endocrine disorders: Secondary | ICD-10-CM

## 2021-01-14 DIAGNOSIS — E1159 Type 2 diabetes mellitus with other circulatory complications: Secondary | ICD-10-CM

## 2021-01-14 DIAGNOSIS — M1990 Unspecified osteoarthritis, unspecified site: Secondary | ICD-10-CM | POA: Insufficient documentation

## 2021-01-14 DIAGNOSIS — E785 Hyperlipidemia, unspecified: Secondary | ICD-10-CM | POA: Diagnosis not present

## 2021-01-14 DIAGNOSIS — M170 Bilateral primary osteoarthritis of knee: Secondary | ICD-10-CM | POA: Insufficient documentation

## 2021-01-14 DIAGNOSIS — Z1211 Encounter for screening for malignant neoplasm of colon: Secondary | ICD-10-CM | POA: Insufficient documentation

## 2021-01-14 DIAGNOSIS — Z794 Long term (current) use of insulin: Secondary | ICD-10-CM | POA: Diagnosis not present

## 2021-01-14 DIAGNOSIS — R159 Full incontinence of feces: Secondary | ICD-10-CM | POA: Insufficient documentation

## 2021-01-14 NOTE — Patient Instructions (Signed)

## 2021-01-14 NOTE — Progress Notes (Signed)
Established Patient Office Visit  Subjective:  Patient ID: Samantha [name], female    DOB: 01/31/1956  Age: 65 y.o. MRN: [id number]  CC:  Chief Complaint  Patient presents with  . Diabetes  . Hypertension  . Hyperlipidemia    HPI Samantha [name] presents for follow on diabetes mellitus, hypertension and hyperlipidemia.  Diabetes mellitus: Pt denies increased urination or thirst. Pt reports medication compliance. No hypoglycemic events. Checking glucose at home. FBS average 130s, lowest 92 (forgot to take her meds that day) and highest 181. Denies diet changes, continues to watch carbohydrates and sweets. Does report increased stress at work related to co-worker.   HTN: Pt denies chest pain, palpitations, dizziness or lower extremity swelling. Taking medication as directed without side effects. Checks BP at home and readings range 120/70-80. Pt follows a low salt diet and reports good hydration.  HLD: Pt taking medication as directed without issues.    Past Medical History:  Diagnosis Date  . Arthritis   . Dyslipidemia   . Heart disease   . Heart murmur 2011  . Left bundle branch block 2011  . Type II or unspecified type diabetes mellitus without mention of complication, not stated as uncontrolled   . Unspecified essential hypertension     Past Surgical History:  Procedure Laterality Date  . REPLACEMENT TOTAL KNEE Left 2017  . TONSILLECTOMY  1962    Family History  Problem Relation Age of Onset  . Diabetes Mother   . Hypertension Mother   . CAD Mother   . Hypertension Father   . Diabetes Father   . CAD Father   . Diabetes Brother   . Hyperlipidemia Brother   . Hypertension Brother   . CAD Brother   . Diabetes Sister   . Hypertension Sister     Social History   Socioeconomic History  . Marital status: Married    Spouse name: Not on file  . Number of children: Not on file  . Years of education: Not on file  . Highest education level: Not on file   Occupational History  . Not on file  Tobacco Use  . Smoking status: Never Smoker  . Smokeless tobacco: Never Used  Vaping Use  . Vaping Use: Never used  Substance and Sexual Activity  . Alcohol use: No  . Drug use: No  . Sexual activity: Not Currently  Other Topics Concern  . Not on file  Social History Narrative  . Not on file   Social Determinants of Health   Financial Resource Strain: Not on file  Food Insecurity: Not on file  Transportation Needs: Not on file  Physical Activity: Not on file  Stress: Not on file  Social Connections: Not on file  Intimate Partner Violence: Not on file    Outpatient Medications Prior to Visit  Medication Sig Dispense Refill  . aspirin 81 MG tablet Take 81 mg by mouth every morning.     Marland Kitchen azelastine (ASTELIN) 0.1 % nasal spray Place 1 spray into both nostrils daily as needed for allergies.     . budesonide-formoterol (SYMBICORT) 80-4.5 MCG/ACT inhaler Inhale 2 puffs into the lungs 2 (two) times daily. 1 Inhaler 12  . Calcium Citrate-Vitamin D (CALCIUM + D PO) Take 1 tablet by mouth every morning.     . cetirizine (ZYRTEC) 10 MG tablet Take 10 mg by mouth daily.    . diclofenac (VOLTAREN) 75 MG EC tablet Take 1 tablet by mouth twice daily 180 tablet 0  .  FLUoxetine (PROZAC) 20 MG capsule Take 1 capsule by mouth once daily in the morning 90 capsule 0  . fluticasone (FLONASE) 50 MCG/ACT nasal spray Place 2 sprays into both nostrils daily. 16 g 0  . gabapentin (NEURONTIN) 300 MG capsule Take 1 capsule by mouth twice daily 180 capsule 0  . insulin degludec (TRESIBA) 100 UNIT/ML FlexTouch Pen Inject 12 Units into the skin daily. 15 mL 0  . ipratropium-albuterol (DUONEB) 0.5-2.5 (3) MG/3ML SOLN Inhale 3 mLs into the lungs daily as needed (wheezing).     . metFORMIN (GLUCOPHAGE) 1000 MG tablet TAKE 1 TABLET BY MOUTH TWICE DAILY WITH A MEAL 180 tablet 0  . Omega-3 Fatty Acids (FISH OIL) 1000 MG CAPS Take 1,000 mg by mouth every morning.     .  pantoprazole (PROTONIX) 40 MG tablet Take 1 tablet (40 mg total) by mouth daily. 30 tablet 11  . Semaglutide, 1 MG/DOSE, (OZEMPIC, 1 MG/DOSE,) 2 MG/1.5ML SOPN Inject 0.75 mLs (1 mg total) into the skin once a week. (use only once lower doses finished) 10 pen 0  . verapamil (CALAN-SR) 240 MG CR tablet TAKE 1 TABLET BY MOUTH ONCE DAILY IN THE MORNING 90 tablet 0  . hydrochlorothiazide (MICROZIDE) 12.5 MG capsule Take 1 capsule (12.5 mg total) by mouth daily. 90 capsule 1  . lovastatin (MEVACOR) 20 MG tablet TAKE 1 TABLET BY MOUTH AT BEDTIME 90 tablet 0  . valsartan (DIOVAN) 160 MG tablet Take 1 tablet by mouth once daily 90 tablet 0  . amoxicillin-clavulanate (AUGMENTIN) 875-125 MG tablet Take 1 tablet by mouth 2 (two) times daily. (Patient not taking: Reported on 01/14/2021) 14 tablet 0  . benzonatate (TESSALON PERLES) 100 MG capsule Take 1 capsule (100 mg total) by mouth 3 (three) times daily as needed for cough. (Patient not taking: Reported on 01/14/2021) 20 capsule 0  . Cobalamin Combinations (NEURIVA PLUS PO) Take by mouth. (Patient not taking: Reported on 01/14/2021)    . nitroGLYCERIN (NITROSTAT) 0.4 MG SL tablet Place 1 tablet (0.4 mg total) under the tongue every 5 (five) minutes as needed for chest pain. (Patient not taking: Reported on 01/14/2021) 30 tablet 12  . trimethoprim-polymyxin b (POLYTRIM) ophthalmic solution Plae 2 drops into the right eye every 6 (six) hours x 5 days. (Patient not taking: Reported on 01/14/2021) 10 mL 0   No facility-administered medications prior to visit.    Allergies  Allergen Reactions  . Iohexol Anaphylaxis     Code: SOB, Desc: respiratory distress-ASM, Onset Date: 60737106   . Iodinated Diagnostic Agents Other (See Comments)  . Jardiance [Empagliflozin] Rash    ROS Review of Systems A fourteen system review of systems was performed and found to be positive as per HPI.   Objective:    Physical Exam General:  Well Developed, well nourished, in no  acute distress  Neuro:  Alert and oriented,  extra-ocular muscles intact  HEENT:  Normocephalic, atraumatic, neck supple Skin:  no gross rash, warm, pink. Cardiac:  RRR, S1 S2 wnl's  Respiratory:  ECTA B/L w/o wheezing, Not using accessory muscles, speaking in full sentences- unlabored. Vascular:  Ext warm, no cyanosis apprec.; no gross edema  Psych:  No HI/SI, judgement and insight good, Euthymic mood. Full Affect.  BP 103/65   Pulse 69   Temp (!) 97.5 F (36.4 C)   Ht $R'5\' 10"'GF$  (1.778 m)   Wt 221 lb 8 oz (100.5 kg)   SpO2 96%   BMI 31.78 kg/m  Wt Readings from  Last 3 Encounters:  01/14/21 221 lb 8 oz (100.5 kg)  09/13/20 225 lb (102.1 kg)  05/20/20 237 lb (107.5 kg)     Health Maintenance Due  Topic Date Due  . OPHTHALMOLOGY EXAM  Never done  . MAMMOGRAM  02/15/2018  . DEXA SCAN  Never done  . PNA vac Low Risk Adult (1 of 2 - PCV13) 03/06/2019    There are no preventive care reminders to display for this patient.  Lab Results  Component Value Date   TSH 1.950 06/10/2020   Lab Results  Component Value Date   WBC 11.0 (H) 06/10/2020   HGB 12.4 06/10/2020   HCT 36.9 06/10/2020   MCV 82 06/10/2020   PLT 339 06/10/2020   Lab Results  Component Value Date   NA 139 06/10/2020   K 4.1 06/10/2020   CO2 26 06/10/2020   GLUCOSE 150 (H) 06/10/2020   BUN 23 06/10/2020   CREATININE 0.98 06/10/2020   BILITOT 0.5 06/10/2020   ALKPHOS 68 06/10/2020   AST 15 06/10/2020   ALT 26 06/10/2020   PROT 7.1 06/10/2020   ALBUMIN 4.2 06/10/2020   CALCIUM 9.1 06/10/2020   ANIONGAP 10 08/10/2019   Lab Results  Component Value Date   CHOL 121 06/10/2020   Lab Results  Component Value Date   HDL 52 06/10/2020   Lab Results  Component Value Date   LDLCALC 47 06/10/2020   Lab Results  Component Value Date   TRIG 122 06/10/2020   Lab Results  Component Value Date   CHOLHDL 2.3 06/10/2020   Lab Results  Component Value Date   HGBA1C 6.9 (A) 09/13/2020       Assessment & Plan:   Problem List Items Addressed This Visit      Cardiovascular and Mediastinum   Hypertension associated with diabetes ([place])     Endocrine   Diabetes ([place]) - Primary   Relevant Orders   POCT glycosylated hemoglobin (Hb A1C)   Hyperlipidemia associated with type 2 diabetes mellitus ([place])     Diabetes mellitus: -A1c today 7.1, increased from 6.9. Discussed with patient increasing insulin degludec to 14 units and is agreeable. Recommend to incorporate stress reduction techniques such as walking. Continue Metformin and Ozempic. -Continue ambulatory glucose monitoring and monitor for hypoglycemia.  -Continue low carbohydrate and glucose diet. -Will continue to monitor.  Hypertension associated with diabetes: -Controlled. -Continue current medication regimen. -Continue low sodium diet and stay well hydrated, at least 64 fl oz. -Last CMP: renal function and electrolytes wnl -Will continue to monitor and plan to repeat CMP with CPE.  Hyperlipidemia associated with type 2 diabetes mellitus: -Last lipid panel: total cholesterol 121, triglycerides 122, HDL 52, LDL 47 at goal -Continue current medication regimen. Last hepatic function normal. -Follow a heart healthy diet. -Will continue to monitor and plan to repeat lipid panel and hepatic function with CPE.  No orders of the defined types were placed in this encounter.   Follow-up: Return in about 4 months (around 05/14/2021) for CPE and FBW.   Note:  This note was prepared with assistance of Dragon voice recognition software. Occasional wrong-word or sound-a-like substitutions may have occurred due to the inherent limitations of voice recognition software.  Lorrene Reid, PA-C

## 2021-01-17 LAB — POCT GLYCOSYLATED HEMOGLOBIN (HGB A1C): Hemoglobin A1C: 7.1 % — AB (ref 4.0–5.6)

## 2021-01-27 DIAGNOSIS — E119 Type 2 diabetes mellitus without complications: Secondary | ICD-10-CM | POA: Diagnosis not present

## 2021-01-27 LAB — HM DIABETES EYE EXAM

## 2021-02-02 ENCOUNTER — Other Ambulatory Visit: Payer: Self-pay | Admitting: Physician Assistant

## 2021-02-02 DIAGNOSIS — E1169 Type 2 diabetes mellitus with other specified complication: Secondary | ICD-10-CM

## 2021-02-15 ENCOUNTER — Encounter: Payer: Self-pay | Admitting: Physician Assistant

## 2021-03-08 ENCOUNTER — Other Ambulatory Visit: Payer: Self-pay | Admitting: Physician Assistant

## 2021-04-05 ENCOUNTER — Other Ambulatory Visit: Payer: Self-pay | Admitting: Physician Assistant

## 2021-04-14 ENCOUNTER — Other Ambulatory Visit: Payer: Self-pay | Admitting: Physician Assistant

## 2021-04-20 ENCOUNTER — Telehealth: Payer: Self-pay | Admitting: Physician Assistant

## 2021-04-20 NOTE — Telephone Encounter (Signed)
Contacted patient to advise need for Mammogram and to see if patient would like to schedule for June 8th, when Mammogram bus is here.   Left msg for patient to call back. AS, CMA

## 2021-04-21 ENCOUNTER — Other Ambulatory Visit: Payer: Self-pay | Admitting: Physician Assistant

## 2021-04-21 DIAGNOSIS — E1169 Type 2 diabetes mellitus with other specified complication: Secondary | ICD-10-CM

## 2021-04-22 ENCOUNTER — Other Ambulatory Visit: Payer: Self-pay | Admitting: Physician Assistant

## 2021-04-26 ENCOUNTER — Other Ambulatory Visit: Payer: Self-pay | Admitting: Physician Assistant

## 2021-04-26 DIAGNOSIS — E1159 Type 2 diabetes mellitus with other circulatory complications: Secondary | ICD-10-CM

## 2021-04-26 DIAGNOSIS — I152 Hypertension secondary to endocrine disorders: Secondary | ICD-10-CM

## 2021-05-10 ENCOUNTER — Other Ambulatory Visit: Payer: Self-pay | Admitting: Physician Assistant

## 2021-05-10 DIAGNOSIS — E1169 Type 2 diabetes mellitus with other specified complication: Secondary | ICD-10-CM

## 2021-05-17 ENCOUNTER — Other Ambulatory Visit: Payer: Self-pay

## 2021-05-17 DIAGNOSIS — M1990 Unspecified osteoarthritis, unspecified site: Secondary | ICD-10-CM

## 2021-05-17 DIAGNOSIS — I152 Hypertension secondary to endocrine disorders: Secondary | ICD-10-CM

## 2021-05-17 DIAGNOSIS — R5383 Other fatigue: Secondary | ICD-10-CM

## 2021-05-17 DIAGNOSIS — Z794 Long term (current) use of insulin: Secondary | ICD-10-CM

## 2021-05-17 DIAGNOSIS — E785 Hyperlipidemia, unspecified: Secondary | ICD-10-CM

## 2021-05-17 DIAGNOSIS — E1159 Type 2 diabetes mellitus with other circulatory complications: Secondary | ICD-10-CM

## 2021-05-17 DIAGNOSIS — Z1329 Encounter for screening for other suspected endocrine disorder: Secondary | ICD-10-CM

## 2021-05-17 DIAGNOSIS — E1169 Type 2 diabetes mellitus with other specified complication: Secondary | ICD-10-CM

## 2021-05-18 ENCOUNTER — Other Ambulatory Visit: Payer: Medicare HMO

## 2021-05-18 ENCOUNTER — Other Ambulatory Visit: Payer: Self-pay

## 2021-05-18 DIAGNOSIS — E785 Hyperlipidemia, unspecified: Secondary | ICD-10-CM

## 2021-05-18 DIAGNOSIS — R5383 Other fatigue: Secondary | ICD-10-CM | POA: Diagnosis not present

## 2021-05-18 DIAGNOSIS — I152 Hypertension secondary to endocrine disorders: Secondary | ICD-10-CM | POA: Diagnosis not present

## 2021-05-18 DIAGNOSIS — Z1329 Encounter for screening for other suspected endocrine disorder: Secondary | ICD-10-CM

## 2021-05-18 DIAGNOSIS — E1159 Type 2 diabetes mellitus with other circulatory complications: Secondary | ICD-10-CM | POA: Diagnosis not present

## 2021-05-18 DIAGNOSIS — M1990 Unspecified osteoarthritis, unspecified site: Secondary | ICD-10-CM | POA: Diagnosis not present

## 2021-05-18 DIAGNOSIS — E1169 Type 2 diabetes mellitus with other specified complication: Secondary | ICD-10-CM | POA: Diagnosis not present

## 2021-05-18 DIAGNOSIS — Z794 Long term (current) use of insulin: Secondary | ICD-10-CM | POA: Diagnosis not present

## 2021-05-19 LAB — COMPREHENSIVE METABOLIC PANEL
ALT: 21 IU/L (ref 0–32)
AST: 17 IU/L (ref 0–40)
Albumin/Globulin Ratio: 1.6 (ref 1.2–2.2)
Albumin: 4.2 g/dL (ref 3.8–4.8)
Alkaline Phosphatase: 66 IU/L (ref 44–121)
BUN/Creatinine Ratio: 27 (ref 12–28)
BUN: 34 mg/dL — ABNORMAL HIGH (ref 8–27)
Bilirubin Total: 0.4 mg/dL (ref 0.0–1.2)
CO2: 24 mmol/L (ref 20–29)
Calcium: 9.3 mg/dL (ref 8.7–10.3)
Chloride: 100 mmol/L (ref 96–106)
Creatinine, Ser: 1.28 mg/dL — ABNORMAL HIGH (ref 0.57–1.00)
Globulin, Total: 2.6 g/dL (ref 1.5–4.5)
Glucose: 96 mg/dL (ref 65–99)
Potassium: 4.4 mmol/L (ref 3.5–5.2)
Sodium: 138 mmol/L (ref 134–144)
Total Protein: 6.8 g/dL (ref 6.0–8.5)
eGFR: 46 mL/min/{1.73_m2} — ABNORMAL LOW (ref >59)

## 2021-05-19 LAB — HEMOGLOBIN A1C
Est. average glucose Bld gHb Est-mCnc: 143 mg/dL
Hgb A1c MFr Bld: 6.6 % — ABNORMAL HIGH (ref 4.8–5.6)

## 2021-05-19 LAB — CBC
Hematocrit: 36.3 % (ref 34.0–46.6)
Hemoglobin: 11.8 g/dL (ref 11.1–15.9)
MCH: 28.6 pg (ref 26.6–33.0)
MCHC: 32.5 g/dL (ref 31.5–35.7)
MCV: 88 fL (ref 79–97)
Platelets: 292 10*3/uL (ref 150–450)
RBC: 4.13 x10E6/uL (ref 3.77–5.28)
RDW: 13.3 % (ref 11.7–15.4)
WBC: 9.5 10*3/uL (ref 3.4–10.8)

## 2021-05-19 LAB — LIPID PANEL
Chol/HDL Ratio: 2.3 ratio (ref 0.0–4.4)
Cholesterol, Total: 122 mg/dL (ref 100–199)
HDL: 52 mg/dL (ref >39)
LDL Chol Calc (NIH): 46 mg/dL (ref 0–99)
Triglycerides: 140 mg/dL (ref 0–149)
VLDL Cholesterol Cal: 24 mg/dL (ref 5–40)

## 2021-05-19 LAB — TSH: TSH: 1.73 u[IU]/mL (ref 0.450–4.500)

## 2021-05-19 LAB — VITAMIN D 25 HYDROXY (VIT D DEFICIENCY, FRACTURES): Vit D, 25-Hydroxy: 27.1 ng/mL — ABNORMAL LOW (ref 30.0–100.0)

## 2021-05-19 LAB — VITAMIN B12: Vitamin B-12: 201 pg/mL — ABNORMAL LOW (ref 232–1245)

## 2021-05-20 ENCOUNTER — Ambulatory Visit: Payer: Medicare HMO | Admitting: Physician Assistant

## 2021-05-23 ENCOUNTER — Other Ambulatory Visit: Payer: Self-pay

## 2021-05-23 ENCOUNTER — Ambulatory Visit (INDEPENDENT_AMBULATORY_CARE_PROVIDER_SITE_OTHER): Payer: Medicare HMO | Admitting: Physician Assistant

## 2021-05-23 ENCOUNTER — Encounter: Payer: Self-pay | Admitting: Physician Assistant

## 2021-05-23 VITALS — BP 107/66 | HR 68 | Temp 98.2°F | Ht 70.0 in | Wt 218.2 lb

## 2021-05-23 DIAGNOSIS — Z0000 Encounter for general adult medical examination without abnormal findings: Secondary | ICD-10-CM

## 2021-05-23 DIAGNOSIS — E1169 Type 2 diabetes mellitus with other specified complication: Secondary | ICD-10-CM | POA: Diagnosis not present

## 2021-05-23 DIAGNOSIS — Z794 Long term (current) use of insulin: Secondary | ICD-10-CM | POA: Diagnosis not present

## 2021-05-23 LAB — POCT UA - MICROALBUMIN
Albumin/Creatinine Ratio, Urine, POC: <30
Creatinine, POC: 300 mg/dL
Microalbumin Ur, POC: 80 mg/L

## 2021-05-23 NOTE — Progress Notes (Signed)
Subjective:     Samantha [name] is a 65 y.o. female and is here for a comprehensive physical exam. The patient reports problems - head tremor when sitting still and wathcing TV x couple of months . Reports has retired but plans to work part-time in the near future.  Social History   Socioeconomic History   Marital status: Married    Spouse name: Not on file   Number of children: Not on file   Years of education: Not on file   Highest education level: Not on file  Occupational History   Not on file  Tobacco Use   Smoking status: Never   Smokeless tobacco: Never  Vaping Use   Vaping Use: Never used  Substance and Sexual Activity   Alcohol use: No   Drug use: No   Sexual activity: Not Currently  Other Topics Concern   Not on file  Social History Narrative   Not on file   Social Determinants of Health   Financial Resource Strain: Not on file  Food Insecurity: Not on file  Transportation Needs: Not on file  Physical Activity: Not on file  Stress: Not on file  Social Connections: Not on file  Intimate Partner Violence: Not on file   Health Maintenance  Topic Date Due   Hepatitis C Screening  Never done   MAMMOGRAM  02/15/2018   DEXA SCAN  Never done   PNA vac Low Risk Adult (1 of 2 - PCV13) 03/06/2019   COVID-19 Vaccine (4 - Booster for Moderna series) 01/16/2021   INFLUENZA VACCINE  06/27/2021   HEMOGLOBIN A1C  11/17/2021   OPHTHALMOLOGY EXAM  01/27/2022   FOOT EXAM  05/23/2022   TETANUS/TDAP  08/07/2022   COLONOSCOPY (Pts 45-10yrs Insurance coverage will need to be confirmed)  12/03/2029   Zoster Vaccines- Shingrix  Completed   HPV VACCINES  Aged Out    The following portions of the patient's history were reviewed and updated as appropriate: allergies, current medications, past family history, past medical history, past social history, past surgical history, and problem list.  Review of Systems Pertinent items noted in HPI and remainder of comprehensive ROS  otherwise negative.   Objective:    BP 107/66   Pulse 68   Temp 98.2 F (36.8 C)   Ht 5\' 10"  (1.778 m)   Wt 218 lb 3.2 oz (99 kg)   SpO2 97%   BMI 31.31 kg/m  General appearance: alert, cooperative, and no distress Head: Normocephalic, without obvious abnormality, atraumatic Eyes: conjunctivae/corneas clear. PERRL, EOM's intact. Fundi benign. Ears: Normal TM with evidence of scarring of left ear, TM perforation of right TM (chronic) Nose: Nares normal. Septum midline. Mucosa normal. No drainage or sinus tenderness. Throat: lips, mucosa, and tongue normal; teeth and gums normal Neck: no carotid bruit, no JVD, supple, symmetrical, trachea midline, and thyroid not enlarged, symmetric, no tenderness/mass/nodules Back: symmetric, no curvature. ROM normal. No CVA tenderness. Lungs: clear to auscultation bilaterally Heart: regular rate and rhythm, S1, S2 normal, and +murmur Abdomen: normal findings: bowel sounds normal, umbilicus normal, and mild tenderness of lower abdomen Pelvic: not indicated; post-menopausal, no abnormal Pap smears in past Extremities: extremities normal, atraumatic, no cyanosis or edema Pulses: 2+ and symmetric Skin: Skin color, texture, turgor normal. No rashes or lesions Lymph nodes: Cervical adenopathy: mildly enlarged submandibular gland and Supraclavicular adenopathy: normal Neurologic: Grossly normal    Assessment:    Healthy female exam.     Plan:  -Discussed most recent lab results  which are essentially within normal limits or stable from prior.  A1c has improved from 7.1 to 6.6.  Vitamin B12 and vitamin D are low and recommend to start taking cyanocobalamin [phone] mcg and vitamin D3 2000 units. Will continue to monitor renal function. UA microalbumin normal. -Patient reports obtained mammogram and bone density last year, unsure of location she went to and will try to find out so we can request records. -UTD on immunizations. Declined hep C  screening. -Continue to monitor carbohydrate and glucose intake, follow a diet low in saturated and trans fats. Continue to stay as active as possible. -Patient plans to follow up with her ENT for submandibular gland swelling. -Reassurance provided for head tremor. -Follow up in 4 months for reg OV: DM, HTN, HLD, and FBW few days prior  See After Visit Summary for Counseling Recommendations

## 2021-05-23 NOTE — Patient Instructions (Addendum)
Vitamin B12 (cyanocobalamin) [phone] mcg daily Vitamin D3 2000 units daily  Submandibular gland    Preventive Care 30 Years and Older, Female Preventive care refers to lifestyle choices and visits with your health care provider that can promote health and wellness. This includes: A yearly physical exam. This is also called an annual wellness visit. Regular dental and eye exams. Immunizations. Screening for certain conditions. Healthy lifestyle choices, such as: Eating a healthy diet. Getting regular exercise. Not using drugs or products that contain nicotine and tobacco. Limiting alcohol use. What can I expect for my preventive care visit? Physical exam Your health care provider will check your: Height and weight. These may be used to calculate your BMI (body mass index). BMI is a measurement that tells if you are at a healthy weight. Heart rate and blood pressure. Body temperature. Skin for abnormal spots. Counseling Your health care provider may ask you questions about your: Past medical problems. Family's medical history. Alcohol, tobacco, and drug use. Emotional well-being. Home life and relationship well-being. Sexual activity. Diet, exercise, and sleep habits. History of falls. Memory and ability to understand (cognition). Work and work Statistician. Pregnancy and menstrual history. Access to firearms. What immunizations do I need?  Vaccines are usually given at various ages, according to a schedule. Your health care provider will recommend vaccines for you based on your age, medicalhistory, and lifestyle or other factors, such as travel or where you work. What tests do I need? Blood tests Lipid and cholesterol levels. These may be checked every 5 years, or more often depending on your overall health. Hepatitis C test. Hepatitis B test. Screening Lung cancer screening. You may have this screening every year starting at age 38 if you have a 30-pack-year history  of smoking and currently smoke or have quit within the past 15 years. Colorectal cancer screening. All adults should have this screening starting at age 43 and continuing until age 59. Your health care provider may recommend screening at age 64 if you are at increased risk. You will have tests every 1-10 years, depending on your results and the type of screening test. Diabetes screening. This is done by checking your blood sugar (glucose) after you have not eaten for a while (fasting). You may have this done every 1-3 years. Mammogram. This may be done every 1-2 years. Talk with your health care provider about how often you should have regular mammograms. Abdominal aortic aneurysm (AAA) screening. You may need this if you are a current or former smoker. BRCA-related cancer screening. This may be done if you have a family history of breast, ovarian, tubal, or peritoneal cancers. Other tests STD (sexually transmitted disease) testing, if you are at risk. Bone density scan. This is done to screen for osteoporosis. You may have this done starting at age 43. Talk with your health care provider about your test results, treatment options,and if necessary, the need for more tests. Follow these instructions at home: Eating and drinking  Eat a diet that includes fresh fruits and vegetables, whole grains, lean protein, and low-fat dairy products. Limit your intake of foods with high amounts of sugar, saturated fats, and salt. Take vitamin and mineral supplements as recommended by your health care provider. Do not drink alcohol if your health care provider tells you not to drink. If you drink alcohol: Limit how much you have to 0-1 drink a day. Be aware of how much alcohol is in your drink. In the [place] one drink equals one 12  oz bottle of beer (355 mL), one 5 oz glass of wine (148 mL), or one 1 oz glass of hard liquor (44 mL).  Lifestyle Take daily care of your teeth and gums. Brush your teeth  every morning and night with fluoride toothpaste. Floss one time each day. Stay active. Exercise for at least 30 minutes 5 or more days each week. Do not use any products that contain nicotine or tobacco, such as cigarettes, e-cigarettes, and chewing tobacco. If you need help quitting, ask your health care provider. Do not use drugs. If you are sexually active, practice safe sex. Use a condom or other form of protection in order to prevent STIs (sexually transmitted infections). Talk with your health care provider about taking a low-dose aspirin or statin. Find healthy ways to cope with stress, such as: Meditation, yoga, or listening to music. Journaling. Talking to a trusted person. Spending time with friends and family. Safety Always wear your seat belt while driving or riding in a vehicle. Do not drive: If you have been drinking alcohol. Do not ride with someone who has been drinking. When you are tired or distracted. While texting. Wear a helmet and other protective equipment during sports activities. If you have firearms in your house, make sure you follow all gun safety procedures. What's next? Visit your health care provider once a year for an annual wellness visit. Ask your health care provider how often you should have your eyes and teeth checked. Stay up to date on all vaccines. This information is not intended to replace advice given to you by your health care provider. Make sure you discuss any questions you have with your healthcare provider. Document Revised: 11/03/2020 Document Reviewed: 11/07/2018 Elsevier Patient Education  2022 [employer].

## 2021-05-28 ENCOUNTER — Other Ambulatory Visit: Payer: Self-pay | Admitting: Physician Assistant

## 2021-06-17 DIAGNOSIS — H6983 Other specified disorders of Eustachian tube, bilateral: Secondary | ICD-10-CM | POA: Diagnosis not present

## 2021-06-17 DIAGNOSIS — H7201 Central perforation of tympanic membrane, right ear: Secondary | ICD-10-CM | POA: Diagnosis not present

## 2021-06-17 DIAGNOSIS — K1121 Acute sialoadenitis: Secondary | ICD-10-CM | POA: Diagnosis not present

## 2021-06-20 ENCOUNTER — Other Ambulatory Visit: Payer: Self-pay | Admitting: Physician Assistant

## 2021-06-28 ENCOUNTER — Telehealth: Payer: Self-pay | Admitting: Cardiovascular Disease

## 2021-06-28 ENCOUNTER — Encounter: Payer: Self-pay | Admitting: Cardiovascular Disease

## 2021-06-28 NOTE — Telephone Encounter (Signed)
STAT if patient feels like he/she is going to faint   Are you dizzy now? no   Do you feel faint or have you passed out? no  Do you have any other symptoms? pt has been sob and cant do small activities without sweating and getting dizzy   Have you checked your HR and BP (record if available)? 105/63    Pt c/o Shortness Of Breath: STAT if SOB developed within the last 24 hours or pt is noticeably SOB on the phone  1. Are you currently SOB (can you hear that pt is SOB on the phone)? No   2. How long have you been experiencing SOB? Two weeks   3. Are you SOB when sitting or when up moving around? Moving around  4. Are you currently experiencing any other symptoms? Dizziness, nausea, pain between shoulder blades and back, sob

## 2021-06-28 NOTE — Progress Notes (Signed)
Cardiology Office Note:    Date:  06/29/2021   ID:  Samantha [name], DOB 12-03-1955, MRN [id number]  PCP:  Lorrene Reid, PA-C  Cardiologist:  Darcella Shiffman  Electrophysiologist:  None   Referring MD: Lorrene Reid, PA-C   Chief Complaint  Patient presents with   Abnormal ECG    Prior notes:   Samantha [name] is a 65 y.o. female with a hx of diabetes mellitus, obesity, hypertension, left bundle branch block.  We are asked to see her today by Tobie Lords, FNP  for further evaluation of her shortness of breath.  I have seen her in the past.  Has lots of GI issues after eating .  She has lots of pain and discomfort especially after eating.  She is quite active at work.  She is never had episodes of chest pain with walking around.  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  She does have some ankle swelling. Avoids salt.   Does not walk for exercise  We performed a heart cath in 2006 which was essentially normal.  I saw her back in 2017 for left bundle branch block.  Echocardiogram at that time revealed normal left ventricular function.  Has cp related to eating.    Aug. 3, 2022: Samantha [name] is seen today for follow up of her chest pain .  Chest pain seems to be related to eating She is having lots of nausea, vomitting and dizziness  Has a LBBB  Last Friday , she was mopping the floor. Got sweaty, lightheaded Intrascapular pain  and chest pain  Happened again on Saturday  Doe not get any regular exercise  She is allergic to IV contrast - will need pre-meds for any imaging procedure like cath or CTA   Mother and sister had CABG in their 71s  Brother died at 20 due to CAD  Father had CAD in his 17s  Had a myoview in 2011 Showed an anterior defect - ? Breast artifact. Vs. MI   Past Medical History:  Diagnosis Date   Arthritis    Dyslipidemia    Heart disease    Heart murmur 2011   Left bundle branch block 2011   Type II or unspecified type diabetes mellitus  without mention of complication, not stated as uncontrolled    Unspecified essential hypertension     Past Surgical History:  Procedure Laterality Date   REPLACEMENT TOTAL KNEE Left 2017   TONSILLECTOMY  1962    Current Medications: Current Meds  Medication Sig   aspirin 81 MG tablet Take 81 mg by mouth every morning.    azelastine (ASTELIN) 0.1 % nasal spray Place 1 spray into both nostrils daily as needed for allergies.    budesonide-formoterol (SYMBICORT) 80-4.5 MCG/ACT inhaler Inhale 2 puffs into the lungs 2 (two) times daily.   Calcium Citrate-Vitamin D (CALCIUM + D PO) Take 1 tablet by mouth every morning.    cetirizine (ZYRTEC) 10 MG tablet Take 10 mg by mouth daily.   diclofenac (VOLTAREN) 75 MG EC tablet Take 1 tablet by mouth twice daily   diphenhydrAMINE (BENADRYL) 50 MG tablet Take 1 tablet (50 mg total) by mouth once for 1 dose. Take on Thursday Aug 4 2 hours prior to your procedure   FLUoxetine (PROZAC) 20 MG capsule Take 1 capsule by mouth once daily in the morning   fluticasone (FLONASE) 50 MCG/ACT nasal spray Place 2 sprays into both nostrils daily.   gabapentin (NEURONTIN) 300 MG capsule Take 1 capsule by  mouth twice daily   hydrochlorothiazide (MICROZIDE) 12.5 MG capsule Take 1 capsule by mouth once daily   ipratropium-albuterol (DUONEB) 0.5-2.5 (3) MG/3ML SOLN Inhale 3 mLs into the lungs daily as needed (wheezing).    lovastatin (MEVACOR) 20 MG tablet TAKE 1 TABLET BY MOUTH AT BEDTIME   metFORMIN (GLUCOPHAGE) 1000 MG tablet TAKE 1 TABLET BY MOUTH TWICE DAILY WITH A MEAL   Omega-3 Fatty Acids (FISH OIL) 1000 MG CAPS Take 1,000 mg by mouth every morning.    OZEMPIC, 1 MG/DOSE, 4 MG/3ML SOPN INJECT 0.75 MLS (1 MG TOTAL) INTO THE SKIN ONCE A WEEK (USE ONLY ONCE LOWER DOSES ARE FINISHED)   pantoprazole (PROTONIX) 40 MG tablet Take 1 tablet (40 mg total) by mouth daily.   predniSONE (DELTASONE) 50 MG tablet Take 1 pill at 13 hours, 7 hours and 2 hours prior to procedure    TRESIBA FLEXTOUCH 100 UNIT/ML FlexTouch Pen INJECT 12 UNITS SUBCUTANEOUSLY ONCE DAILY   valsartan (DIOVAN) 160 MG tablet Take 1 tablet by mouth once daily   verapamil (CALAN-SR) 240 MG CR tablet TAKE 1 TABLET BY MOUTH ONCE DAILY IN THE MORNING   [DISCONTINUED] nitroGLYCERIN (NITROSTAT) 0.4 MG SL tablet Place 1 tablet (0.4 mg total) under the tongue every 5 (five) minutes as needed for chest pain.     Allergies:   Iohexol, Iodinated diagnostic agents, and Jardiance [empagliflozin]   Social History   Socioeconomic History   Marital status: Married    Spouse name: Not on file   Number of children: Not on file   Years of education: Not on file   Highest education level: Not on file  Occupational History   Not on file  Tobacco Use   Smoking status: Never   Smokeless tobacco: Never  Vaping Use   Vaping Use: Never used  Substance and Sexual Activity   Alcohol use: No   Drug use: No   Sexual activity: Not Currently  Other Topics Concern   Not on file  Social History Narrative   Not on file   Social Determinants of Health   Financial Resource Strain: Not on file  Food Insecurity: Not on file  Transportation Needs: Not on file  Physical Activity: Not on file  Stress: Not on file  Social Connections: Not on file     Family History: The patient's family history includes CAD in her brother, father, and mother; Diabetes in her brother, father, mother, and sister; Hyperlipidemia in her brother; Hypertension in her brother, father, mother, and sister.  ROS:   Please see the history of present illness.     All other systems reviewed and are negative.  EKGs/Labs/Other Studies Reviewed:    The following studies were reviewed today:   EKG:  Aug. 3, 2022:   NSR at 72 LBBB  No  changes.    Recent Labs: 05/18/2021: ALT 21; TSH 1.730 06/29/2021: BUN 42; Creatinine, Ser 1.69; Hemoglobin 11.8; Platelets 326; Potassium 4.3; Sodium 138  Recent Lipid Panel    Component Value Date/Time    CHOL 122 05/18/2021 0817   TRIG 140 05/18/2021 0817   HDL 52 05/18/2021 0817   CHOLHDL 2.3 05/18/2021 0817   CHOLHDL 2.5 Ratio 06/02/2010 2104   VLDL 36 06/02/2010 2104   LDLCALC 46 05/18/2021 0817    Physical Exam:    Physical Exam: Blood pressure 122/60, pulse 72, height '5\' 10"'$  (1.778 m), weight 217 lb (98.4 kg), SpO2 96 %.  GEN:  Well nourished, well developed in no acute  distress HEENT: Normal NECK: No JVD; No carotid bruits LYMPHATICS: No lymphadenopathy CARDIAC: RRR , no murmurs, rubs, gallops RESPIRATORY:  Clear to auscultation without rales, wheezing or rhonchi  ABDOMEN: Soft, non-tender, non-distended MUSCULOSKELETAL:  No edema; No deformity  SKIN: Warm and dry NEUROLOGIC:  Alert and oriented x 3    ASSESSMENT:    1. Unstable angina ([place])   2. LBBB (left bundle branch block)   3. Essential hypertension     PLAN:      Unstable angina :   Samantha [name]  presents with symptoms that are worrisome for unstable angina.  Chest pain radiating to her intrascapular region associated with intense diaphoresis and lightheadedness.  She has a chronic left bundle branch block.  She has a very strong family history of premature coronary artery disease.  We performed a heart catheterization back in 2006 which did not reveal any coronary artery stenosis.  I think that we need to repeat a heart catheterization.  She has an IV contrast allergy.  Give her premedications for her IV contrast allergy.  We will schedule her for heart catheterization in the next several days.  We have discussed the risk, benefits, options of heart catheterization.  She understands and agrees to proceed.  I was asked to do a Peer to Peer review by her insurance co. I spoke to Dr. Uvaldo Bristle who approved the cath .   Confirmation number will be faxed later tonight   She has CKD. Creatine = 1.6 We have stopped her HCTZ, valsartan , metformin Will arrive early for IV   2.  Left bundle branch block:  Stable  . Cath this week     Medication Adjustments/Labs and Tests Ordered: Current medicines are reviewed at length with the patient today.  Concerns regarding medicines are outlined above.  Orders Placed This Encounter  Procedures   CBC   Basic Metabolic Panel (BMET)   EKG 12-Lead    Meds ordered this encounter  Medications   nitroGLYCERIN (NITROSTAT) 0.4 MG SL tablet    Sig: Place 1 tablet (0.4 mg total) under the tongue every 5 (five) minutes as needed for chest pain.    Dispense:  30 tablet    Refill:  12   predniSONE (DELTASONE) 50 MG tablet    Sig: Take 1 pill at 13 hours, 7 hours and 2 hours prior to procedure    Dispense:  3 tablet    Refill:  0   diphenhydrAMINE (BENADRYL) 50 MG tablet    Sig: Take 1 tablet (50 mg total) by mouth once for 1 dose. Take on Thursday Aug 4 2 hours prior to your procedure    Dispense:  1 tablet    Refill:  0      Patient Instructions  Medication Instructions:  Your physician recommends that you continue on your current medications as directed. Please refer to the Current Medication list given to you today. Pick up your refill of Nitroglycerin *If you need a refill on your cardiac medications before your next appointment, please call your pharmacy*   Lab Work: TODAY - CBC, BMET If you have labs (blood work) drawn today and your tests are completely normal, you will receive your results only by: [place] (if you have MyChart) OR A paper copy in the mail If you have any lab test that is abnormal or we need to change your treatment, we will call you to review the results.   Testing/Procedures:  [facility] [place], [place] [address] Dept: [phone] Loc: [phone]  Mardee Munnerlyn  06/29/2021  You are scheduled for a Cardiac Catheterization on Thursday, August 4 with Dr. Shelva Majestic.  1. Please arrive at the [facility] (Main Entrance A) at [facility]: [address] [place], [place] [address] at 11:30 AM (This time is two hours before your procedure to ensure your preparation). Free valet parking service is available.   Special note: Every effort is made to have your procedure done on time. Please understand that emergencies sometimes delay scheduled procedures.  2. Diet: Do not eat solid foods after midnight.  The patient may have clear liquids until 5am upon the day of the procedure.  3. Labs: Done Today  4. Medication instructions in preparation for your procedure:   Contrast Allergy: Yes, Please take Prednisone '50mg'$  by mouth at: Thirteen hours prior to cath 12:30am on Thursday Seven hours prior to cath 6:00am on Thursday (take at 6:30 am)  And prior to leaving home please take last dose of Prednisone '50mg'$  and Benadryl '50mg'$  by mouth. You need to arrive at the hospital at 11:30 am   Take only 6 units of insulin the night before your procedure. Do not take any insulin on the day of the procedure.  Do not take Diabetes Med Glucophage (Metformin) on the day of the procedure and HOLD 48 HOURS AFTER THE PROCEDURE.  On the morning of your procedure, take your Aspirin and any morning medicines NOT listed above.  You may use sips of water.  5. Plan for one night stay--bring personal belongings. 6. Bring a current list of your medications and current insurance cards. 7. You MUST have a responsible person to drive you home. 8. Someone MUST be with you the first 24 hours after you arrive home or your discharge will be delayed. 9. Please wear clothes that are easy to get on and off and wear slip-on shoes.  Thank you for allowing [place] to care for you!   --  Invasive Cardiovascular services    Follow-Up: At [facility], you and your health needs are our priority.  As part of our continuing mission to provide you with exceptional heart care, we have created designated Provider Care  Teams.  These Care Teams include your primary Cardiologist (physician) and Advanced Practice Providers (APPs -  Physician Assistants and Nurse Practitioners) who all work together to provide you with the care you need, when you need it.  We recommend signing up for the patient portal called "MyChart".  Sign up information is provided on this After Visit Summary.  MyChart is used to connect with patients for Virtual Visits (Telemedicine).  Patients are able to view lab/test results, encounter notes, upcoming appointments, etc.  Non-urgent messages can be sent to your provider as well.   To learn more about what you can do with MyChart, go to [email or web address].    Your next appointment:   4-6 weeks on Monday Sept. 12 at 11:40 am  The format for your next appointment:   In Person  Provider:   Mertie Moores, MD      Signed, Mertie Moores, MD  06/29/2021 5:38 PM    [place]

## 2021-06-28 NOTE — Telephone Encounter (Signed)
Spoke with pt. She states she has had several episodes of dizziness, nausea, and profuse sweating while sweeping and vacuuming in the last couple of days. She also has an ache between her shoulder blades which is relieved with rest. Today she is feeling better, no active CP, back pain, dizziness, or nausea.   I advised her to make sure she is staying hydrated. She is not sure she is drinking enough water. We reviewed indications to call 911 with unrelieved CP or like symptoms. She should not wait for her appt, but call 911 for assistance.  She has verbalized understanding and had no additional needs.

## 2021-06-28 NOTE — H&P (View-Only) (Signed)
Cardiology Office Note:    Date:  06/29/2021   ID:  Samantha [name], DOB 05-Feb-1956, MRN [id number]  PCP:  Lorrene Reid, PA-C  Cardiologist:  Glanda Spanbauer  Electrophysiologist:  None   Referring MD: Lorrene Reid, PA-C   Chief Complaint  Patient presents with   Abnormal ECG    Prior notes:   Samantha [name] is a 65 y.o. female with a hx of diabetes mellitus, obesity, hypertension, left bundle branch block.  We are asked to see her today by Tobie Lords, FNP  for further evaluation of her shortness of breath.  I have seen her in the past.  Has lots of GI issues after eating .  She has lots of pain and discomfort especially after eating.  She is quite active at work.  She is never had episodes of chest pain with walking around.  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  She does have some ankle swelling. Avoids salt.   Does not walk for exercise  We performed a heart cath in 2006 which was essentially normal.  I saw her back in 2017 for left bundle branch block.  Echocardiogram at that time revealed normal left ventricular function.  Has cp related to eating.    Aug. 3, 2022: Samantha [name] is seen today for follow up of her chest pain .  Chest pain seems to be related to eating She is having lots of nausea, vomitting and dizziness  Has a LBBB  Last Friday , she was mopping the floor. Got sweaty, lightheaded Intrascapular pain  and chest pain  Happened again on Saturday  Doe not get any regular exercise  She is allergic to IV contrast - will need pre-meds for any imaging procedure like cath or CTA   Mother and sister had CABG in their 83s  Brother died at 29 due to CAD  Father had CAD in his 12s  Had a myoview in 2011 Showed an anterior defect - ? Breast artifact. Vs. MI   Past Medical History:  Diagnosis Date   Arthritis    Dyslipidemia    Heart disease    Heart murmur 2011   Left bundle branch block 2011   Type II or unspecified type diabetes mellitus  without mention of complication, not stated as uncontrolled    Unspecified essential hypertension     Past Surgical History:  Procedure Laterality Date   REPLACEMENT TOTAL KNEE Left 2017   TONSILLECTOMY  1962    Current Medications: Current Meds  Medication Sig   aspirin 81 MG tablet Take 81 mg by mouth every morning.    azelastine (ASTELIN) 0.1 % nasal spray Place 1 spray into both nostrils daily as needed for allergies.    budesonide-formoterol (SYMBICORT) 80-4.5 MCG/ACT inhaler Inhale 2 puffs into the lungs 2 (two) times daily.   Calcium Citrate-Vitamin D (CALCIUM + D PO) Take 1 tablet by mouth every morning.    cetirizine (ZYRTEC) 10 MG tablet Take 10 mg by mouth daily.   diclofenac (VOLTAREN) 75 MG EC tablet Take 1 tablet by mouth twice daily   diphenhydrAMINE (BENADRYL) 50 MG tablet Take 1 tablet (50 mg total) by mouth once for 1 dose. Take on Thursday Aug 4 2 hours prior to your procedure   FLUoxetine (PROZAC) 20 MG capsule Take 1 capsule by mouth once daily in the morning   fluticasone (FLONASE) 50 MCG/ACT nasal spray Place 2 sprays into both nostrils daily.   gabapentin (NEURONTIN) 300 MG capsule Take 1 capsule by  mouth twice daily   hydrochlorothiazide (MICROZIDE) 12.5 MG capsule Take 1 capsule by mouth once daily   ipratropium-albuterol (DUONEB) 0.5-2.5 (3) MG/3ML SOLN Inhale 3 mLs into the lungs daily as needed (wheezing).    lovastatin (MEVACOR) 20 MG tablet TAKE 1 TABLET BY MOUTH AT BEDTIME   metFORMIN (GLUCOPHAGE) 1000 MG tablet TAKE 1 TABLET BY MOUTH TWICE DAILY WITH A MEAL   Omega-3 Fatty Acids (FISH OIL) 1000 MG CAPS Take 1,000 mg by mouth every morning.    OZEMPIC, 1 MG/DOSE, 4 MG/3ML SOPN INJECT 0.75 MLS (1 MG TOTAL) INTO THE SKIN ONCE A WEEK (USE ONLY ONCE LOWER DOSES ARE FINISHED)   pantoprazole (PROTONIX) 40 MG tablet Take 1 tablet (40 mg total) by mouth daily.   predniSONE (DELTASONE) 50 MG tablet Take 1 pill at 13 hours, 7 hours and 2 hours prior to procedure    TRESIBA FLEXTOUCH 100 UNIT/ML FlexTouch Pen INJECT 12 UNITS SUBCUTANEOUSLY ONCE DAILY   valsartan (DIOVAN) 160 MG tablet Take 1 tablet by mouth once daily   verapamil (CALAN-SR) 240 MG CR tablet TAKE 1 TABLET BY MOUTH ONCE DAILY IN THE MORNING   [DISCONTINUED] nitroGLYCERIN (NITROSTAT) 0.4 MG SL tablet Place 1 tablet (0.4 mg total) under the tongue every 5 (five) minutes as needed for chest pain.     Allergies:   Iohexol, Iodinated diagnostic agents, and Jardiance [empagliflozin]   Social History   Socioeconomic History   Marital status: Married    Spouse name: Not on file   Number of children: Not on file   Years of education: Not on file   Highest education level: Not on file  Occupational History   Not on file  Tobacco Use   Smoking status: Never   Smokeless tobacco: Never  Vaping Use   Vaping Use: Never used  Substance and Sexual Activity   Alcohol use: No   Drug use: No   Sexual activity: Not Currently  Other Topics Concern   Not on file  Social History Narrative   Not on file   Social Determinants of Health   Financial Resource Strain: Not on file  Food Insecurity: Not on file  Transportation Needs: Not on file  Physical Activity: Not on file  Stress: Not on file  Social Connections: Not on file     Family History: The patient's family history includes CAD in her brother, father, and mother; Diabetes in her brother, father, mother, and sister; Hyperlipidemia in her brother; Hypertension in her brother, father, mother, and sister.  ROS:   Please see the history of present illness.     All other systems reviewed and are negative.  EKGs/Labs/Other Studies Reviewed:    The following studies were reviewed today:   EKG:  Aug. 3, 2022:   NSR at 70 LBBB  No  changes.    Recent Labs: 05/18/2021: ALT 21; TSH 1.730 06/29/2021: BUN 42; Creatinine, Ser 1.69; Hemoglobin 11.8; Platelets 326; Potassium 4.3; Sodium 138  Recent Lipid Panel    Component Value Date/Time    CHOL 122 05/18/2021 0817   TRIG 140 05/18/2021 0817   HDL 52 05/18/2021 0817   CHOLHDL 2.3 05/18/2021 0817   CHOLHDL 2.5 Ratio 06/02/2010 2104   VLDL 36 06/02/2010 2104   LDLCALC 46 05/18/2021 0817    Physical Exam:    Physical Exam: Blood pressure 122/60, pulse 72, height '5\' 10"'$  (1.778 m), weight 217 lb (98.4 kg), SpO2 96 %.  GEN:  Well nourished, well developed in no acute  distress HEENT: Normal NECK: No JVD; No carotid bruits LYMPHATICS: No lymphadenopathy CARDIAC: RRR , no murmurs, rubs, gallops RESPIRATORY:  Clear to auscultation without rales, wheezing or rhonchi  ABDOMEN: Soft, non-tender, non-distended MUSCULOSKELETAL:  No edema; No deformity  SKIN: Warm and dry NEUROLOGIC:  Alert and oriented x 3    ASSESSMENT:    1. Unstable angina ([place])   2. LBBB (left bundle branch block)   3. Essential hypertension     PLAN:      Unstable angina :   Waneda  presents with symptoms that are worrisome for unstable angina.  Chest pain radiating to her intrascapular region associated with intense diaphoresis and lightheadedness.  She has a chronic left bundle branch block.  She has a very strong family history of premature coronary artery disease.  We performed a heart catheterization back in 2006 which did not reveal any coronary artery stenosis.  I think that we need to repeat a heart catheterization.  She has an IV contrast allergy.  Give her premedications for her IV contrast allergy.  We will schedule her for heart catheterization in the next several days.  We have discussed the risk, benefits, options of heart catheterization.  She understands and agrees to proceed.  I was asked to do a Peer to Peer review by her insurance co. I spoke to Dr. Uvaldo Bristle who approved the cath .   Confirmation number will be faxed later tonight   She has CKD. Creatine = 1.6 We have stopped her HCTZ, valsartan , metformin Will arrive early for IV   2.  Left bundle branch block:  Stable  . Cath this week     Medication Adjustments/Labs and Tests Ordered: Current medicines are reviewed at length with the patient today.  Concerns regarding medicines are outlined above.  Orders Placed This Encounter  Procedures   CBC   Basic Metabolic Panel (BMET)   EKG 12-Lead    Meds ordered this encounter  Medications   nitroGLYCERIN (NITROSTAT) 0.4 MG SL tablet    Sig: Place 1 tablet (0.4 mg total) under the tongue every 5 (five) minutes as needed for chest pain.    Dispense:  30 tablet    Refill:  12   predniSONE (DELTASONE) 50 MG tablet    Sig: Take 1 pill at 13 hours, 7 hours and 2 hours prior to procedure    Dispense:  3 tablet    Refill:  0   diphenhydrAMINE (BENADRYL) 50 MG tablet    Sig: Take 1 tablet (50 mg total) by mouth once for 1 dose. Take on Thursday Aug 4 2 hours prior to your procedure    Dispense:  1 tablet    Refill:  0      Patient Instructions  Medication Instructions:  Your physician recommends that you continue on your current medications as directed. Please refer to the Current Medication list given to you today. Pick up your refill of Nitroglycerin *If you need a refill on your cardiac medications before your next appointment, please call your pharmacy*   Lab Work: TODAY - CBC, BMET If you have labs (blood work) drawn today and your tests are completely normal, you will receive your results only by: [place] (if you have MyChart) OR A paper copy in the mail If you have any lab test that is abnormal or we need to change your treatment, we will call you to review the results.   Testing/Procedures:  [facility] OFFICE [place], [place] [address] [address] Dept: [phone] Loc: [phone]  Tanvi Issac  06/29/2021  You are scheduled for a Cardiac Catheterization on Thursday, August 4 with Dr. Shelva Majestic.  1. Please arrive at the [facility] (Main Entrance A) at [facility]: [address] [place], [place] [address] at 11:30 AM (This time is two hours before your procedure to ensure your preparation). Free valet parking service is available.   Special note: Every effort is made to have your procedure done on time. Please understand that emergencies sometimes delay scheduled procedures.  2. Diet: Do not eat solid foods after midnight.  The patient may have clear liquids until 5am upon the day of the procedure.  3. Labs: Done Today  4. Medication instructions in preparation for your procedure:   Contrast Allergy: Yes, Please take Prednisone '50mg'$  by mouth at: Thirteen hours prior to cath 12:30am on Thursday Seven hours prior to cath 6:00am on Thursday (take at 6:30 am)  And prior to leaving home please take last dose of Prednisone '50mg'$  and Benadryl '50mg'$  by mouth. You need to arrive at the hospital at 11:30 am   Take only 6 units of insulin the night before your procedure. Do not take any insulin on the day of the procedure.  Do not take Diabetes Med Glucophage (Metformin) on the day of the procedure and HOLD 48 HOURS AFTER THE PROCEDURE.  On the morning of your procedure, take your Aspirin and any morning medicines NOT listed above.  You may use sips of water.  5. Plan for one night stay--bring personal belongings. 6. Bring a current list of your medications and current insurance cards. 7. You MUST have a responsible person to drive you home. 8. Someone MUST be with you the first 24 hours after you arrive home or your discharge will be delayed. 9. Please wear clothes that are easy to get on and off and wear slip-on shoes.  Thank you for allowing [place] to care for you!   -- [facility]    Follow-Up: At [facility], you and your health needs are our priority.  As part of our continuing mission to provide you with exceptional heart care, we have created designated Provider Care  Teams.  These Care Teams include your primary Cardiologist (physician) and Advanced Practice Providers (APPs -  Physician Assistants and Nurse Practitioners) who all work together to provide you with the care you need, when you need it.  We recommend signing up for the patient portal called "MyChart".  Sign up information is provided on this After Visit Summary.  MyChart is used to connect with patients for Virtual Visits (Telemedicine).  Patients are able to view lab/test results, encounter notes, upcoming appointments, etc.  Non-urgent messages can be sent to your provider as well.   To learn more about what you can do with MyChart, go to [email or web address].    Your next appointment:   4-6 weeks on Monday Sept. 12 at 11:40 am  The format for your next appointment:   In Person  Provider:   Mertie Moores, MD      Signed, Mertie Moores, MD  06/29/2021 5:38 PM    [place]

## 2021-06-29 ENCOUNTER — Encounter: Payer: Self-pay | Admitting: Cardiovascular Disease

## 2021-06-29 ENCOUNTER — Telehealth: Payer: Self-pay | Admitting: *Deleted

## 2021-06-29 ENCOUNTER — Ambulatory Visit: Payer: Medicare HMO | Admitting: Cardiovascular Disease

## 2021-06-29 ENCOUNTER — Other Ambulatory Visit: Payer: Self-pay

## 2021-06-29 ENCOUNTER — Telehealth: Payer: Self-pay | Admitting: Cardiovascular Disease

## 2021-06-29 VITALS — BP 122/60 | HR 72 | Ht 70.0 in | Wt 217.0 lb

## 2021-06-29 DIAGNOSIS — I447 Left bundle-branch block, unspecified: Secondary | ICD-10-CM

## 2021-06-29 DIAGNOSIS — I10 Essential (primary) hypertension: Secondary | ICD-10-CM

## 2021-06-29 DIAGNOSIS — I200 Unstable angina: Secondary | ICD-10-CM

## 2021-06-29 LAB — CBC
Hematocrit: 35.7 % (ref 34.0–46.6)
Hemoglobin: 11.8 g/dL (ref 11.1–15.9)
MCH: 29.3 pg (ref 26.6–33.0)
MCHC: 33.1 g/dL (ref 31.5–35.7)
MCV: 89 fL (ref 79–97)
Platelets: 326 10*3/uL (ref 150–450)
RBC: 4.03 x10E6/uL (ref 3.77–5.28)
RDW: 14.3 % (ref 11.7–15.4)
WBC: 13.9 10*3/uL — ABNORMAL HIGH (ref 3.4–10.8)

## 2021-06-29 LAB — BASIC METABOLIC PANEL
BUN/Creatinine Ratio: 25 (ref 12–28)
BUN: 42 mg/dL — ABNORMAL HIGH (ref 8–27)
CO2: 25 mmol/L (ref 20–29)
Calcium: 8.8 mg/dL (ref 8.7–10.3)
Chloride: 103 mmol/L (ref 96–106)
Creatinine, Ser: 1.69 mg/dL — ABNORMAL HIGH (ref 0.57–1.00)
Glucose: 191 mg/dL — ABNORMAL HIGH (ref 65–99)
Potassium: 4.3 mmol/L (ref 3.5–5.2)
Sodium: 138 mmol/L (ref 134–144)
eGFR: 33 mL/min/{1.73_m2} — ABNORMAL LOW (ref >59)

## 2021-06-29 MED ORDER — PREDNISONE 50 MG PO TABS: ORAL_TABLET | ORAL | 0 refills | Status: DC

## 2021-06-29 MED ORDER — NITROGLYCERIN 0.4 MG SL SUBL: 0.4000 mg | SUBLINGUAL_TABLET | SUBLINGUAL | 12 refills | Status: AC | PRN

## 2021-06-29 MED ORDER — DIPHENHYDRAMINE HCL 50 MG PO TABS: 50.0000 mg | ORAL_TABLET | Freq: Once | ORAL | 0 refills | Status: DC

## 2021-06-29 NOTE — Telephone Encounter (Addendum)
Cardiac catheterization scheduled at [facility] for: Thursday June 30, 2021 1:30 PM [facility] Main Entrance A [facility]) at: 8:30 AM-pre-procedure hydration   No solid food after midnight prior to cath, clear liquids until 5 AM day of procedure.  CONTRAST ALLERGY: yes 13 hour Prednisone and Benadryl Prep reviewed with patient: 06/30/21 Prednisone 50 mg 12:30 AM 06/30/21 Prednisone 50 mg 6:30 AM 06/30/21 Prednisone 50 mg and Benadryl at hospital 11:30 AM-pt will take this with her to hospital I have asked patient to let nursing staff know I have asked her to bring these medications to the hospital and take at 11:30 AM 06/30/21.  Medication instructions: -Hold  Insulin AM of procedure/1/2 usual Insulin HS prior to procedure  Metformin-day of procedure and 48 hours post procedure  HCTZ-AM of procedure-GFR 33  Valsartan-AM of procedure-GFR 33  Voltaren -AM of procedure-GFR 33  -Except hold medications AM meds can be  taken pre-cath with sips of water including:   aspirin 81 mg   Confirmed patient has responsible adult to drive home post procedure and be with patient first 24 hours after arriving home.  Patients are allowed one visitor in the waiting room during the time they are at the hospital for their procedure. Both patient and visitor must wear a mask once they enter the hospital.   Patient reports does not currently have any symptoms concerning for COVID-19 and no household members with COVID-19 like illness.        Reviewed procedure/mask/visitor instructions with patient.

## 2021-06-29 NOTE — Patient Instructions (Signed)
Medication Instructions:  Your physician recommends that you continue on your current medications as directed. Please refer to the Current Medication list given to you today. Pick up your refill of Nitroglycerin *If you need a refill on your cardiac medications before your next appointment, please call your pharmacy*   Lab Work: TODAY - CBC, BMET If you have labs (blood work) drawn today and your tests are completely normal, you will receive your results only by: [place] (if you have MyChart) OR A paper copy in the mail If you have any lab test that is abnormal or we need to change your treatment, we will call you to review the results.   Testing/Procedures:  [facility] OFFICE [place], [place] [address] Dept: [phone] Loc: [phone]  Samantha [name]  06/29/2021  You are scheduled for a Cardiac Catheterization on Thursday, August 4 with Dr. Shelva Majestic.  1. Please arrive at the [facility] (Main Entrance A) at [facility]: [address] [place], [place] [address] at 11:30 AM (This time is two hours before your procedure to ensure your preparation). Free valet parking service is available.   Special note: Every effort is made to have your procedure done on time. Please understand that emergencies sometimes delay scheduled procedures.  2. Diet: Do not eat solid foods after midnight.  The patient may have clear liquids until 5am upon the day of the procedure.  3. Labs: Done Today  4. Medication instructions in preparation for your procedure:   Contrast Allergy: Yes, Please take Prednisone '50mg'$  by mouth at: Thirteen hours prior to cath 12:30am on Thursday Seven hours prior to cath 6:00am on Thursday (take at 6:30 am)  And prior to leaving home please take last dose of Prednisone '50mg'$  and Benadryl '50mg'$  by mouth. You need to arrive at the hospital at 11:30  am   Take only 6 units of insulin the night before your procedure. Do not take any insulin on the day of the procedure.  Do not take Diabetes Med Glucophage (Metformin) on the day of the procedure and HOLD 48 HOURS AFTER THE PROCEDURE.  On the morning of your procedure, take your Aspirin and any morning medicines NOT listed above.  You may use sips of water.  5. Plan for one night stay--bring personal belongings. 6. Bring a current list of your medications and current insurance cards. 7. You MUST have a responsible person to drive you home. 8. Someone MUST be with you the first 24 hours after you arrive home or your discharge will be delayed. 9. Please wear clothes that are easy to get on and off and wear slip-on shoes.  Thank you for allowing [place] to care for you!   -- [facility]    Follow-Up: At [facility], you and your health needs are our priority.  As part of our continuing mission to provide you with exceptional heart care, we have created designated Provider Care Teams.  These Care Teams include your primary Cardiologist (physician) and Advanced Practice Providers (APPs -  Physician Assistants and Nurse Practitioners) who all work together to provide you with the care you need, when you need it.  We recommend signing up for the patient portal called "MyChart".  Sign up information is provided on this After Visit Summary.  MyChart is used to connect with patients for Virtual Visits (Telemedicine).  Patients are able to view lab/test  results, encounter notes, upcoming appointments, etc.  Non-urgent messages can be sent to your provider as well.   To learn more about what you can do with MyChart, go to [email or web address].    Your next appointment:   4-6 weeks on Monday Sept. 12 at 11:40 am  The format for your next appointment:   In Person  Provider:   Mertie Moores, MD

## 2021-06-29 NOTE — Telephone Encounter (Signed)
Dr. Uvaldo Bristle is requesting a STAT peer to peer for this patient. The requesting provider is DR. Shelva Majestic. The patient is established with Dr. Mertie Moores. Dr. Claiborne Billings is doing the heart cath for this patient tomorrow  Reference # [id number]

## 2021-06-30 ENCOUNTER — Encounter (HOSPITAL_COMMUNITY): Payer: Self-pay | Admitting: Cardiovascular Disease

## 2021-06-30 ENCOUNTER — Ambulatory Visit (HOSPITAL_COMMUNITY)
Admission: RE | Admit: 2021-06-30 | Discharge: 2021-06-30 | Disposition: A | Discharge status: Home / Self Care | Payer: Medicare HMO | Attending: Cardiovascular Disease | Admitting: Cardiovascular Disease

## 2021-06-30 ENCOUNTER — Encounter (HOSPITAL_COMMUNITY)
Admission: RE | Disposition: A | Discharge status: Home / Self Care | Payer: Self-pay | Source: Home / Self Care | Attending: Cardiovascular Disease

## 2021-06-30 DIAGNOSIS — Z794 Long term (current) use of insulin: Secondary | ICD-10-CM | POA: Insufficient documentation

## 2021-06-30 DIAGNOSIS — I200 Unstable angina: Secondary | ICD-10-CM

## 2021-06-30 DIAGNOSIS — Z888 Allergy status to other drugs, medicaments and biological substances status: Secondary | ICD-10-CM | POA: Diagnosis not present

## 2021-06-30 DIAGNOSIS — Z8249 Family history of ischemic heart disease and other diseases of the circulatory system: Secondary | ICD-10-CM | POA: Diagnosis not present

## 2021-06-30 DIAGNOSIS — I129 Hypertensive chronic kidney disease with stage 1 through stage 4 chronic kidney disease, or unspecified chronic kidney disease: Secondary | ICD-10-CM | POA: Diagnosis not present

## 2021-06-30 DIAGNOSIS — Z7982 Long term (current) use of aspirin: Secondary | ICD-10-CM | POA: Insufficient documentation

## 2021-06-30 DIAGNOSIS — Z7951 Long term (current) use of inhaled steroids: Secondary | ICD-10-CM | POA: Diagnosis not present

## 2021-06-30 DIAGNOSIS — N189 Chronic kidney disease, unspecified: Secondary | ICD-10-CM | POA: Diagnosis not present

## 2021-06-30 DIAGNOSIS — I447 Left bundle-branch block, unspecified: Secondary | ICD-10-CM | POA: Insufficient documentation

## 2021-06-30 DIAGNOSIS — Z833 Family history of diabetes mellitus: Secondary | ICD-10-CM | POA: Diagnosis not present

## 2021-06-30 DIAGNOSIS — Z7984 Long term (current) use of oral hypoglycemic drugs: Secondary | ICD-10-CM | POA: Diagnosis not present

## 2021-06-30 DIAGNOSIS — Z791 Long term (current) use of non-steroidal anti-inflammatories (NSAID): Secondary | ICD-10-CM | POA: Diagnosis not present

## 2021-06-30 DIAGNOSIS — E1122 Type 2 diabetes mellitus with diabetic chronic kidney disease: Secondary | ICD-10-CM | POA: Insufficient documentation

## 2021-06-30 DIAGNOSIS — E669 Obesity, unspecified: Secondary | ICD-10-CM | POA: Insufficient documentation

## 2021-06-30 DIAGNOSIS — Z96652 Presence of left artificial knee joint: Secondary | ICD-10-CM | POA: Insufficient documentation

## 2021-06-30 DIAGNOSIS — Z79899 Other long term (current) drug therapy: Secondary | ICD-10-CM | POA: Diagnosis not present

## 2021-06-30 DIAGNOSIS — Z6831 Body mass index (BMI) 31.0-31.9, adult: Secondary | ICD-10-CM | POA: Diagnosis not present

## 2021-06-30 DIAGNOSIS — R079 Chest pain, unspecified: Secondary | ICD-10-CM

## 2021-06-30 DIAGNOSIS — E785 Hyperlipidemia, unspecified: Secondary | ICD-10-CM | POA: Insufficient documentation

## 2021-06-30 DIAGNOSIS — Z91041 Radiographic dye allergy status: Secondary | ICD-10-CM | POA: Diagnosis not present

## 2021-06-30 HISTORY — PX: LEFT HEART CATH AND CORONARY ANGIOGRAPHY: CATH118249

## 2021-06-30 LAB — GLUCOSE, CAPILLARY
Glucose-Capillary: 232 mg/dL — ABNORMAL HIGH (ref 70–99)
Glucose-Capillary: 189 mg/dL — ABNORMAL HIGH (ref 70–99)

## 2021-06-30 SURGERY — LEFT HEART CATH AND CORONARY ANGIOGRAPHY
Anesthesia: LOCAL

## 2021-06-30 MED ORDER — MIDAZOLAM HCL 2 MG/2ML IJ SOLN
INTRAMUSCULAR | Status: DC | PRN
  Administered 2021-06-30: 2 mg via INTRAVENOUS

## 2021-06-30 MED ORDER — SODIUM CHLORIDE 0.9% FLUSH: 3.0000 mL | INTRAVENOUS | Status: DC | PRN

## 2021-06-30 MED ORDER — SODIUM CHLORIDE 0.9 % IV SOLN: 250.0000 mL | INTRAVENOUS | Status: DC | PRN

## 2021-06-30 MED ORDER — ACETAMINOPHEN 325 MG PO TABS: 650.0000 mg | ORAL_TABLET | ORAL | Status: DC | PRN

## 2021-06-30 MED ORDER — LIDOCAINE HCL (PF) 1 % IJ SOLN
INTRAMUSCULAR | Status: DC | PRN
  Administered 2021-06-30: 2 mL

## 2021-06-30 MED ORDER — SODIUM CHLORIDE 0.9% FLUSH: 3.0000 mL | Freq: Two times a day (BID) | INTRAVENOUS | Status: DC

## 2021-06-30 MED ORDER — ASPIRIN 81 MG PO CHEW: 81.0000 mg | CHEWABLE_TABLET | ORAL | Status: DC

## 2021-06-30 MED ORDER — LIDOCAINE HCL (PF) 1 % IJ SOLN
INTRAMUSCULAR | Status: AC
  Filled 2021-06-30: qty 30

## 2021-06-30 MED ORDER — MIDAZOLAM HCL 2 MG/2ML IJ SOLN
INTRAMUSCULAR | Status: AC
  Filled 2021-06-30: qty 2

## 2021-06-30 MED ORDER — VERAPAMIL HCL 2.5 MG/ML IV SOLN
INTRAVENOUS | Status: AC
  Filled 2021-06-30: qty 2

## 2021-06-30 MED ORDER — SODIUM CHLORIDE 0.9 % WEIGHT BASED INFUSION
3.0000 mL/kg/h | INTRAVENOUS | Status: AC
  Administered 2021-06-30: 3 mL/kg/h via INTRAVENOUS

## 2021-06-30 MED ORDER — HEPARIN (PORCINE) IN NACL 1000-0.9 UT/500ML-% IV SOLN
INTRAVENOUS | Status: AC
  Filled 2021-06-30: qty 1000

## 2021-06-30 MED ORDER — SODIUM CHLORIDE 0.9 % WEIGHT BASED INFUSION: 1.0000 mL/kg/h | INTRAVENOUS | Status: DC

## 2021-06-30 MED ORDER — IOHEXOL 350 MG/ML SOLN
INTRAVENOUS | Status: DC | PRN
  Administered 2021-06-30: 100 mL

## 2021-06-30 MED ORDER — HEPARIN SODIUM (PORCINE) 1000 UNIT/ML IJ SOLN
INTRAMUSCULAR | Status: AC
  Filled 2021-06-30: qty 1

## 2021-06-30 MED ORDER — FENTANYL CITRATE (PF) 100 MCG/2ML IJ SOLN
INTRAMUSCULAR | Status: AC
  Filled 2021-06-30: qty 2

## 2021-06-30 MED ORDER — FENTANYL CITRATE (PF) 100 MCG/2ML IJ SOLN
INTRAMUSCULAR | Status: DC | PRN
  Administered 2021-06-30: 25 ug via INTRAVENOUS

## 2021-06-30 MED ORDER — ONDANSETRON HCL 4 MG/2ML IJ SOLN: 4.0000 mg | Freq: Four times a day (QID) | INTRAMUSCULAR | Status: DC | PRN

## 2021-06-30 MED ORDER — HEPARIN SODIUM (PORCINE) 1000 UNIT/ML IJ SOLN
INTRAMUSCULAR | Status: DC | PRN
  Administered 2021-06-30: 5000 [IU] via INTRAVENOUS

## 2021-06-30 MED ORDER — SODIUM CHLORIDE 0.9 % IV SOLN: INTRAVENOUS | Status: DC

## 2021-06-30 MED ORDER — HEPARIN (PORCINE) IN NACL 1000-0.9 UT/500ML-% IV SOLN
INTRAVENOUS | Status: DC | PRN
  Administered 2021-06-30 (×2): 500 mL

## 2021-06-30 MED ORDER — HYDRALAZINE HCL 20 MG/ML IJ SOLN: 10.0000 mg | INTRAMUSCULAR | Status: DC | PRN

## 2021-06-30 MED ORDER — VERAPAMIL HCL 2.5 MG/ML IV SOLN
INTRAVENOUS | Status: DC | PRN
  Administered 2021-06-30: 10 mL via INTRA_ARTERIAL

## 2021-06-30 MED ORDER — LABETALOL HCL 5 MG/ML IV SOLN: 10.0000 mg | INTRAVENOUS | Status: DC | PRN

## 2021-06-30 SURGICAL SUPPLY — 13 items
CATH INFINITI 5 FR 3DRC (CATHETERS) ×2 IMPLANT
CATH INFINITI 5 FR AR1 MOD (CATHETERS) ×2 IMPLANT
CATH INFINITI 5FR AL1 (CATHETERS) ×2 IMPLANT
CATH INFINITI JR4 5F (CATHETERS) ×2 IMPLANT
CATH OPTITORQUE TIG 4.0 5F (CATHETERS) ×2 IMPLANT
DEVICE RAD TR BAND REGULAR (VASCULAR PRODUCTS) ×2 IMPLANT
GLIDESHEATH SLEND SS 6F .021 (SHEATH) ×2 IMPLANT
GUIDEWIRE INQWIRE 1.5J.035X260 (WIRE) ×1 IMPLANT
INQWIRE 1.5J .035X260CM (WIRE) ×2
KIT HEART LEFT (KITS) ×2 IMPLANT
PACK CARDIAC CATHETERIZATION (CUSTOM PROCEDURE TRAY) ×2 IMPLANT
TRANSDUCER W/STOPCOCK (MISCELLANEOUS) ×2 IMPLANT
TUBING CIL FLEX 10 FLL-RA (TUBING) ×2 IMPLANT

## 2021-06-30 NOTE — Interval H&P Note (Signed)
Cath Lab Visit (complete for each Cath Lab visit)  Clinical Evaluation Leading to the Procedure:   ACS: No.  Non-ACS:    Anginal Classification: CCS II  Anti-ischemic medical therapy: Minimal Therapy (1 class of medications)  Non-Invasive Test Results: No non-invasive testing performed  Prior CABG: No previous CABG      History and Physical Interval Note:  06/30/2021 3:17 PM  Samantha [name]  has presented today for surgery, with the diagnosis of unstable angina.  The various methods of treatment have been discussed with the patient and family. After consideration of risks, benefits and other options for treatment, the patient has consented to  Procedure(s): LEFT HEART CATH AND CORONARY ANGIOGRAPHY (N/A) as a surgical intervention.  The patient's history has been reviewed, patient examined, no change in status, stable for surgery.  I have reviewed the patient's chart and labs.  Questions were answered to the patient's satisfaction.     Shelva Majestic

## 2021-06-30 NOTE — Progress Notes (Signed)
Pt ambulated without difficulty or bleeding.   Discharged home with her husband who will drive and stay with pt x 24 hrs. 

## 2021-06-30 NOTE — Progress Notes (Signed)
Pt took pre-meds as instructed at home. She also took her benadryl and prednisone at 1130 as instructed.

## 2021-06-30 NOTE — Discharge Instructions (Signed)
Radial Site Care  This sheet gives you information about how to care for yourself after your procedure. Your health care provider may also give you more specific instructions. If you have problems or questions, contact your health care provider. What can I expect after the procedure? After the procedure, it is common to have: Bruising and tenderness at the catheter insertion area. Follow these instructions at home: Medicines Take over-the-counter and prescription medicines only as told by your health care provider. Insertion site care Follow instructions from your health care provider about how to take care of your insertion site. Make sure you: Wash your hands with soap and water before you remove your bandage (dressing). If soap and water are not available, use hand sanitizer. May remove dressing in 24 hours. Check your insertion site every day for signs of infection. Check for: Redness, swelling, or pain. Fluid or blood. Pus or a bad smell. Warmth. Do no take baths, swim, or use a hot tub for 5 days. You may shower 24-48 hours after the procedure. Remove the dressing and gently wash the site with plain soap and water. Pat the area dry with a clean towel. Do not rub the site. That could cause bleeding. Do not apply powder or lotion to the site. Activity  For 24 hours after the procedure, or as directed by your health care provider: Do not flex or bend the affected arm. Do not push or pull heavy objects with the affected arm. Do not drive yourself home from the hospital or clinic. You may drive 24 hours after the procedure. Do not operate machinery or power tools. KEEP ARM ELEVATED THE REMAINDER OF THE DAY. Do not push, pull or lift anything that is heavier than 10 lb for 5 days. Ask your health care provider when it is okay to: Return to work or school. Resume usual physical activities or sports. Resume sexual activity. General instructions If the catheter site starts to  bleed, raise your arm and put firm pressure on the site. If the bleeding does not stop, get help right away. This is a medical emergency. DRINK PLENTY OF FLUIDS FOR THE NEXT 2-3 DAYS. No alcohol consumption for 24 hours after receiving sedation. If you went home on the same day as your procedure, a responsible adult should be with you for the first 24 hours after you arrive home. Keep all follow-up visits as told by your health care provider. This is important. Contact a health care provider if: You have a fever. You have redness, swelling, or yellow drainage around your insertion site. Get help right away if: You have unusual pain at the radial site. The catheter insertion area swells very fast. The insertion area is bleeding, and the bleeding does not stop when you hold steady pressure on the area. Your arm or hand becomes pale, cool, tingly, or numb. These symptoms may represent a serious problem that is an emergency. Do not wait to see if the symptoms will go away. Get medical help right away. Call your local emergency services (911 in the [place] Do not drive yourself to the hospital. Summary After the procedure, it is common to have bruising and tenderness at the site. Follow instructions from your health care provider about how to take care of your radial site wound. Check the wound every day for signs of infection.  This information is not intended to replace advice given to you by your health care provider. Make sure you discuss any questions you have with   your health care provider. Document Revised: 12/19/2017 Document Reviewed: 12/19/2017 Elsevier Patient Education  2020 [employer]  

## 2021-07-01 NOTE — Telephone Encounter (Signed)
Dr. Acie Fredrickson spoke with Dr. Uvaldo Bristle on 8/3 and documentation is in patient's office visit encounter for same day.

## 2021-07-12 ENCOUNTER — Emergency Department (HOSPITAL_COMMUNITY): Payer: Medicare HMO

## 2021-07-12 ENCOUNTER — Encounter (HOSPITAL_COMMUNITY): Payer: Self-pay

## 2021-07-12 ENCOUNTER — Emergency Department (HOSPITAL_COMMUNITY)
Admission: EM | Admit: 2021-07-12 | Discharge: 2021-07-13 | Disposition: A | Discharge status: Home / Self Care | Payer: Medicare HMO | Attending: Emergency Medicine | Admitting: Emergency Medicine

## 2021-07-12 ENCOUNTER — Other Ambulatory Visit: Payer: Self-pay

## 2021-07-12 ENCOUNTER — Encounter: Payer: Self-pay | Admitting: Physician Assistant

## 2021-07-12 ENCOUNTER — Ambulatory Visit (INDEPENDENT_AMBULATORY_CARE_PROVIDER_SITE_OTHER): Payer: Medicare HMO | Admitting: Physician Assistant

## 2021-07-12 VITALS — BP 121/73 | HR 67 | Temp 97.4°F | Ht 70.0 in | Wt 218.1 lb

## 2021-07-12 DIAGNOSIS — Z7982 Long term (current) use of aspirin: Secondary | ICD-10-CM | POA: Diagnosis not present

## 2021-07-12 DIAGNOSIS — M79602 Pain in left arm: Secondary | ICD-10-CM | POA: Diagnosis not present

## 2021-07-12 DIAGNOSIS — I10 Essential (primary) hypertension: Secondary | ICD-10-CM | POA: Insufficient documentation

## 2021-07-12 DIAGNOSIS — Z79899 Other long term (current) drug therapy: Secondary | ICD-10-CM | POA: Insufficient documentation

## 2021-07-12 DIAGNOSIS — M5410 Radiculopathy, site unspecified: Secondary | ICD-10-CM | POA: Insufficient documentation

## 2021-07-12 DIAGNOSIS — M549 Dorsalgia, unspecified: Secondary | ICD-10-CM | POA: Diagnosis not present

## 2021-07-12 DIAGNOSIS — I447 Left bundle-branch block, unspecified: Secondary | ICD-10-CM | POA: Diagnosis not present

## 2021-07-12 DIAGNOSIS — R231 Pallor: Secondary | ICD-10-CM

## 2021-07-12 DIAGNOSIS — E119 Type 2 diabetes mellitus without complications: Secondary | ICD-10-CM | POA: Insufficient documentation

## 2021-07-12 DIAGNOSIS — I200 Unstable angina: Secondary | ICD-10-CM | POA: Diagnosis not present

## 2021-07-12 DIAGNOSIS — M545 Low back pain, unspecified: Secondary | ICD-10-CM | POA: Insufficient documentation

## 2021-07-12 DIAGNOSIS — Z96652 Presence of left artificial knee joint: Secondary | ICD-10-CM | POA: Insufficient documentation

## 2021-07-12 DIAGNOSIS — R9431 Abnormal electrocardiogram [ECG] [EKG]: Secondary | ICD-10-CM | POA: Diagnosis not present

## 2021-07-12 DIAGNOSIS — Z7984 Long term (current) use of oral hypoglycemic drugs: Secondary | ICD-10-CM | POA: Diagnosis not present

## 2021-07-12 DIAGNOSIS — M5412 Radiculopathy, cervical region: Secondary | ICD-10-CM | POA: Diagnosis not present

## 2021-07-12 DIAGNOSIS — R52 Pain, unspecified: Secondary | ICD-10-CM

## 2021-07-12 DIAGNOSIS — R0602 Shortness of breath: Secondary | ICD-10-CM | POA: Diagnosis not present

## 2021-07-12 DIAGNOSIS — R0902 Hypoxemia: Secondary | ICD-10-CM | POA: Diagnosis not present

## 2021-07-12 LAB — COMPREHENSIVE METABOLIC PANEL
ALT: 24 U/L (ref 0–44)
AST: 16 U/L (ref 15–41)
Albumin: 3.3 g/dL — ABNORMAL LOW (ref 3.5–5.0)
Alkaline Phosphatase: 60 U/L (ref 38–126)
Anion gap: 12 (ref 5–15)
BUN: 18 mg/dL (ref 8–23)
CO2: 26 mmol/L (ref 22–32)
Calcium: 9.3 mg/dL (ref 8.9–10.3)
Chloride: 98 mmol/L (ref 98–111)
Creatinine, Ser: 1.28 mg/dL — ABNORMAL HIGH (ref 0.44–1.00)
GFR, Estimated: 46 mL/min — ABNORMAL LOW (ref >60)
Glucose, Bld: 121 mg/dL — ABNORMAL HIGH (ref 70–99)
Potassium: 4.4 mmol/L (ref 3.5–5.1)
Sodium: 136 mmol/L (ref 135–145)
Total Bilirubin: 0.6 mg/dL (ref 0.3–1.2)
Total Protein: 6.7 g/dL (ref 6.5–8.1)

## 2021-07-12 LAB — CBC WITH DIFFERENTIAL/PLATELET
Abs Immature Granulocytes: 0.04 10*3/uL (ref 0.00–0.07)
Basophils Absolute: 0.1 10*3/uL (ref 0.0–0.1)
Basophils Relative: 0 %
Eosinophils Absolute: 0.3 10*3/uL (ref 0.0–0.5)
Eosinophils Relative: 3 %
HCT: 38.2 % (ref 36.0–46.0)
Hemoglobin: 12.3 g/dL (ref 12.0–15.0)
Immature Granulocytes: 0 %
Lymphocytes Relative: 22 %
Lymphs Abs: 2.5 10*3/uL (ref 0.7–4.0)
MCH: 29.1 pg (ref 26.0–34.0)
MCHC: 32.2 g/dL (ref 30.0–36.0)
MCV: 90.3 fL (ref 80.0–100.0)
Monocytes Absolute: 0.8 10*3/uL (ref 0.1–1.0)
Monocytes Relative: 7 %
Neutro Abs: 7.8 10*3/uL — ABNORMAL HIGH (ref 1.7–7.7)
Neutrophils Relative %: 68 %
Platelets: 311 10*3/uL (ref 150–400)
RBC: 4.23 MIL/uL (ref 3.87–5.11)
RDW: 13.4 % (ref 11.5–15.5)
WBC: 11.4 10*3/uL — ABNORMAL HIGH (ref 4.0–10.5)
nRBC: 0 % (ref 0.0–0.2)

## 2021-07-12 LAB — TROPONIN I (HIGH SENSITIVITY)
Troponin I (High Sensitivity): 7 ng/L (ref <18)
Troponin I (High Sensitivity): 8 ng/L (ref <18)

## 2021-07-12 MED ORDER — NITROGLYCERIN 0.4 MG SL SUBL: 0.4000 mg | SUBLINGUAL_TABLET | SUBLINGUAL | Status: DC | PRN

## 2021-07-12 NOTE — Progress Notes (Signed)
Acute Office Visit  Subjective:    Patient ID: Samantha [name], female    DOB: 07-13-1956, 65 y.o.   MRN: [id number]  Chief Complaint  Patient presents with   Back Pain   Arm Pain    HPI:  Patient is in today for left arm pain and back pain x 2 days. Patient denies chest pain or palpations. Does have some nausea. Patient has hx of unstable angina and recently saw her cardiologist and had a heart cath.  Past Medical History:  Diagnosis Date   Arthritis    Dyslipidemia    Heart disease    Heart murmur 2011   Left bundle branch block 2011   Type II or unspecified type diabetes mellitus without mention of complication, not stated as uncontrolled    Unspecified essential hypertension     Past Surgical History:  Procedure Laterality Date   LEFT HEART CATH AND CORONARY ANGIOGRAPHY N/A 06/30/2021   Procedure: LEFT HEART CATH AND CORONARY ANGIOGRAPHY;  Surgeon: Troy Sine, MD;  Location: [facility];  Service: Cardiovascular;  Laterality: N/A;   REPLACEMENT TOTAL KNEE Left 2017   TONSILLECTOMY  1962    Family History  Problem Relation Age of Onset   Diabetes Mother    Hypertension Mother    CAD Mother    Hypertension Father    Diabetes Father    CAD Father    Diabetes Brother    Hyperlipidemia Brother    Hypertension Brother    CAD Brother    Diabetes Sister    Hypertension Sister     Social History   Socioeconomic History   Marital status: Married    Spouse name: Not on file   Number of children: Not on file   Years of education: Not on file   Highest education level: Not on file  Occupational History   Not on file  Tobacco Use   Smoking status: Never   Smokeless tobacco: Never  Vaping Use   Vaping Use: Never used  Substance and Sexual Activity   Alcohol use: No   Drug use: No   Sexual activity: Not Currently  Other Topics Concern   Not on file  Social History Narrative   Not on file   Social Determinants of Health   Financial  Resource Strain: Not on file  Food Insecurity: Not on file  Transportation Needs: Not on file  Physical Activity: Not on file  Stress: Not on file  Social Connections: Not on file  Intimate Partner Violence: Not on file    Outpatient Medications Prior to Visit  Medication Sig Dispense Refill   aspirin 81 MG tablet Take 81 mg by mouth every morning.      azelastine (ASTELIN) 0.1 % nasal spray Place 1 spray into both nostrils daily as needed for allergies.      budesonide-formoterol (SYMBICORT) 80-4.5 MCG/ACT inhaler Inhale 2 puffs into the lungs 2 (two) times daily. 1 Inhaler 12   Calcium Citrate-Vitamin D (CALCIUM + D PO) Take 1 tablet by mouth every morning.      cetirizine (ZYRTEC) 10 MG tablet Take 10 mg by mouth daily.     diclofenac (VOLTAREN) 75 MG EC tablet Take 1 tablet by mouth twice daily 180 tablet 0   diphenhydrAMINE (BENADRYL) 50 MG tablet Take 1 tablet (50 mg total) by mouth once for 1 dose. Take on Thursday Aug 4 2 hours prior to your procedure 1 tablet 0   FLUoxetine (PROZAC) 20 MG capsule  Take 1 capsule by mouth once daily in the morning 90 capsule 0   fluticasone (FLONASE) 50 MCG/ACT nasal spray Place 2 sprays into both nostrils daily. 16 g 0   gabapentin (NEURONTIN) 300 MG capsule Take 1 capsule by mouth twice daily 180 capsule 0   hydrochlorothiazide (MICROZIDE) 12.5 MG capsule Take 1 capsule by mouth once daily 90 capsule 0   ipratropium-albuterol (DUONEB) 0.5-2.5 (3) MG/3ML SOLN Inhale 3 mLs into the lungs daily as needed (wheezing).      lovastatin (MEVACOR) 20 MG tablet TAKE 1 TABLET BY MOUTH AT BEDTIME 90 tablet 0   metFORMIN (GLUCOPHAGE) 1000 MG tablet TAKE 1 TABLET BY MOUTH TWICE DAILY WITH A MEAL 180 tablet 0   nitroGLYCERIN (NITROSTAT) 0.4 MG SL tablet Place 1 tablet (0.4 mg total) under the tongue every 5 (five) minutes as needed for chest pain. 30 tablet 12   Omega-3 Fatty Acids (FISH OIL) 1000 MG CAPS Take 1,000 mg by mouth every morning.      OZEMPIC, 1  MG/DOSE, 4 MG/3ML SOPN INJECT 0.75 MLS (1 MG TOTAL) INTO THE SKIN ONCE A WEEK (USE ONLY ONCE LOWER DOSES ARE FINISHED) 30 mL 0   pantoprazole (PROTONIX) 40 MG tablet Take 1 tablet (40 mg total) by mouth daily. 30 tablet 11   predniSONE (DELTASONE) 50 MG tablet Take 1 pill at 13 hours, 7 hours and 2 hours prior to procedure 3 tablet 0   TRESIBA FLEXTOUCH 100 UNIT/ML FlexTouch Pen INJECT 12 UNITS SUBCUTANEOUSLY ONCE DAILY 15 mL 0   valsartan (DIOVAN) 160 MG tablet Take 1 tablet by mouth once daily 90 tablet 0   verapamil (CALAN-SR) 240 MG CR tablet TAKE 1 TABLET BY MOUTH ONCE DAILY IN THE MORNING 90 tablet 0   No facility-administered medications prior to visit.    Allergies  Allergen Reactions   Iohexol Anaphylaxis     Code: SOB, Desc: respiratory distress-ASM, Onset Date: 14431540    Iodinated Diagnostic Agents Other (See Comments)   Jardiance [Empagliflozin] Rash    Review of Systems  Musculoskeletal:  Positive for back pain.      Objective:    Physical Exam Eyes:     Extraocular Movements: Extraocular movements intact.     Pupils: Pupils are equal, round, and reactive to light.  Skin:    Comments: Clammy skin noted  Neurological:     Mental Status: She is alert.    BP 121/73   Pulse 67   Temp (!) 97.4 F (36.3 C)   Ht _0  (1.778 m)   Wt 218 lb 1.6 oz (98.9 kg)   SpO2 96%   BMI 31.29 kg/m  Wt Readings from Last 3 Encounters:  07/12/21 218 lb 1.6 oz (98.9 kg)  06/30/21 217 lb (98.4 kg)  06/29/21 217 lb (98.4 kg)    Health Maintenance Due  Topic Date Due   Hepatitis C Screening  Never done   MAMMOGRAM  02/15/2018   DEXA SCAN  Never done   PNA vac Low Risk Adult (1 of 2 - PCV13) 03/06/2019   COVID-19 Vaccine (4 - Booster for Moderna series) 01/16/2021   INFLUENZA VACCINE  06/27/2021    There are no preventive care reminders to display for this patient.   Lab Results  Component Value Date   TSH 1.730 05/18/2021   Lab Results  Component Value Date    WBC 13.9 (H) 06/29/2021   HGB 11.8 06/29/2021   HCT 35.7 06/29/2021   MCV 89 06/29/2021  PLT 326 06/29/2021   Lab Results  Component Value Date   NA 138 06/29/2021   K 4.3 06/29/2021   CO2 25 06/29/2021   GLUCOSE 191 (H) 06/29/2021   BUN 42 (H) 06/29/2021   CREATININE 1.69 (H) 06/29/2021   BILITOT 0.4 05/18/2021   ALKPHOS 66 05/18/2021   AST 17 05/18/2021   ALT 21 05/18/2021   PROT 6.8 05/18/2021   ALBUMIN 4.2 05/18/2021   CALCIUM 8.8 06/29/2021   ANIONGAP 10 08/10/2019   EGFR 33 (L) 06/29/2021   Lab Results  Component Value Date   CHOL 122 05/18/2021   Lab Results  Component Value Date   HDL 52 05/18/2021   Lab Results  Component Value Date   LDLCALC 46 05/18/2021   Lab Results  Component Value Date   TRIG 140 05/18/2021   Lab Results  Component Value Date   CHOLHDL 2.3 05/18/2021   Lab Results  Component Value Date   HGBA1C 6.6 (H) 05/18/2021       Assessment & Plan:   Problem List Items Addressed This Visit       Cardiovascular and Mediastinum   LBBB (left bundle branch block)   Relevant Medications   nitroGLYCERIN (NITROSTAT) SL tablet 0.4 mg   Other Relevant Orders   EKG 12-Lead     Other   Back pain   Relevant Medications   nitroGLYCERIN (NITROSTAT) SL tablet 0.4 mg   Other Relevant Orders   EKG 12-Lead   Other Visit Diagnoses     Left arm pain    -  Primary   Relevant Medications   nitroGLYCERIN (NITROSTAT) SL tablet 0.4 mg   Other Relevant Orders   EKG 12-Lead   Clammy skin       Relevant Medications   nitroGLYCERIN (NITROSTAT) SL tablet 0.4 mg   Other Relevant Orders   EKG 12-Lead       Left arm pain, back pain, clammy skin: -BP and pulse wnl's. EKG obtained: LBBB more prominent when compared to recent EKG and given symptoms are concerning for atypical presentation of ACS and recommend going to the ED for further evaluation. Patient is agreeable. Nitrostat given in office. Patient transported via EMS. -06/30/2021 cardiac  catheterization revealed normal epicardial coronary arteries.   Meds ordered this encounter  Medications   nitroGLYCERIN (NITROSTAT) SL tablet 0.4 mg     Lorrene Reid, PA-C

## 2021-07-12 NOTE — ED Triage Notes (Addendum)
Pt from PCP office with ems c.o left arm pain and lower back pain since Saturday. Pt had recent exploratory cath procedure done 2 weeks ago that was negative. Pt given 1 nitro at office without relief of pain. Pt a.o, nad noted.

## 2021-07-12 NOTE — ED Provider Notes (Signed)
Emergency Medicine Provider Triage Evaluation Note  Samantha [name] , a 65 y.o. female  was evaluated in triage.  Pt complains of pain in her shoulder and low back   Review of Systems  Positive:  Negative: No fever   Physical Exam  BP (!) 99/51   Pulse 64   Temp 98.4 F (36.9 C) (Oral)   Resp 18   SpO2 94%  Gen:   Awake, no distress   Resp:  Normal effort  MSK:   Pain left shoulder  Other:    Medical Decision Making  Medically screening exam initiated at 5:23 PM.  Appropriate orders placed.  Samantha [name] was informed that the remainder of the evaluation will be completed by another provider, this initial triage assessment does not replace that evaluation, and the importance of remaining in the ED until their evaluation is complete.     Fransico Meadow, PA-C 07/12/21 [place], [place], DO 07/13/21 [phone]

## 2021-07-13 MED ORDER — METHOCARBAMOL 500 MG PO TABS
500.0000 mg | ORAL_TABLET | Freq: Once | ORAL | Status: AC
  Administered 2021-07-13: 500 mg via ORAL
  Filled 2021-07-13: qty 1

## 2021-07-13 MED ORDER — DICLOFENAC SODIUM 1 % EX GEL: 2.0000 g | Freq: Four times a day (QID) | CUTANEOUS | 3 refills | Status: AC

## 2021-07-13 MED ORDER — PREDNISONE 10 MG (21) PO TBPK: ORAL_TABLET | Freq: Every day | ORAL | 0 refills | Status: DC

## 2021-07-13 MED ORDER — KETOROLAC TROMETHAMINE 15 MG/ML IJ SOLN
15.0000 mg | Freq: Once | INTRAMUSCULAR | Status: AC
  Administered 2021-07-13: 15 mg via INTRAMUSCULAR
  Filled 2021-07-13: qty 1

## 2021-07-13 MED ORDER — METHOCARBAMOL 500 MG PO TABS: 500.0000 mg | ORAL_TABLET | Freq: Two times a day (BID) | ORAL | 0 refills | Status: DC

## 2021-07-13 NOTE — ED Provider Notes (Signed)
[facility] Provider Note   CSN: [id number] Arrival date & time: 07/12/21  1712     History Chief Complaint  Patient presents with   Arm Pain   Back Pain    Samantha [name] is a 65 y.o. female with PMHx significant for DM2, HTN, osteoarthritis who presents with 4 days of sharp, radiating pain down the left arm all the way from the shoulder to the tips of the fingers. Patient reports the pain is worse with laying down, somewhat relieved by sitting up. The patient admits to a pins and needles sensation in the affected arm. Patient also endorses pain in her lower back. Patient denies color changes, numbness of left arm. Denies saddle anesthesia, difficulty urinating, fever, history of cancer. Patient has been taking tylenol for the pain but reports no relief. After asking the patient about her sciatica she reports "this feels just like sciatica, but for my arm".  Arm Pain Pertinent negatives include no chest pain and no shortness of breath.  Back Pain Associated symptoms: no chest pain, no fever, no numbness and no weakness       Past Medical History:  Diagnosis Date   Arthritis    Dyslipidemia    Heart disease    Heart murmur 2011   Left bundle branch block 2011   Type II or unspecified type diabetes mellitus without mention of complication, not stated as uncontrolled    Unspecified essential hypertension     Patient Active Problem List   Diagnosis Date Noted   Unstable angina ([place])    Arthritis 01/14/2021   Bowel incontinence 01/14/2021   ETD (eustachian tube dysfunction) 01/14/2021   Left lower quadrant pain 01/14/2021   Personal history of colonic polyps 01/14/2021   Screening for malignant neoplasm of colon 01/14/2021   Hyperlipidemia associated with type 2 diabetes mellitus ([place]) 02/23/2020   Microalbuminuria due to type 2 diabetes mellitus ([place]) 02/23/2020   Type 2 diabetes mellitus with morbid obesity ([place]) 02/23/2020   B12  deficiency 02/23/2020   Vitamin D deficiency 02/23/2020   Family history of heart disease 02/23/2020   Family history of diabetes mellitus (DM) 02/23/2020   Family history of colon cancer-grandmother 02/23/2020   Family history of mixed hyperlipidemia 02/23/2020   Family history of hypertension 02/23/2020   Mood disorder ([place]) 02/23/2020   Degenerative disc disease, lumbar 07/19/2018   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   Spondylosis of lumbar spine 07/19/2018   Lumbar radiculopathy 07/05/2018   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/21/2018   Eustachian tube dysfunction, bilateral 02/21/2018   Recurrent sinusitis 02/21/2018   Tympanic membrane retraction, left 02/21/2018   GERD (gastroesophageal reflux disease) 10/24/2017   Cough 08/14/2017   Dyspnea 08/14/2017   Pulmonary nodule, right 08/14/2017   Back pain 07/12/2017   Obesity (BMI 30-39.9) 06/11/2017   LBBB (left bundle branch block) 08/10/2016   Leukocytosis 08/10/2016   Hypertension associated with diabetes ([place]) 08/10/2016   Pain in the chest 08/09/2016   Diabetes ([place]) 11/20/2015    Past Surgical History:  Procedure Laterality Date   LEFT HEART CATH AND CORONARY ANGIOGRAPHY N/A 06/30/2021   Procedure: LEFT HEART CATH AND CORONARY ANGIOGRAPHY;  Surgeon: Troy Sine, MD;  Location: [facility];  Service: Cardiovascular;  Laterality: N/A;   REPLACEMENT TOTAL KNEE Left 2017   TONSILLECTOMY  1962     OB History   No obstetric history on file.  Family History  Problem Relation Age of Onset   Diabetes Mother    Hypertension Mother    CAD Mother    Hypertension Father    Diabetes Father    CAD Father    Diabetes Brother    Hyperlipidemia Brother    Hypertension Brother    CAD Brother    Diabetes Sister    Hypertension Sister     Social History   Tobacco Use   Smoking status: Never   Smokeless tobacco: Never  Vaping Use   Vaping Use:  Never used  Substance Use Topics   Alcohol use: No   Drug use: No    Home Medications Prior to Admission medications   Medication Sig Start Date End Date Taking? Authorizing Provider  diclofenac Sodium (VOLTAREN) 1 % GEL Apply 2 g topically 4 (four) times daily. 07/13/21  Yes Sydni Elizarraraz H, PA-C  methocarbamol (ROBAXIN) 500 MG tablet Take 1 tablet (500 mg total) by mouth 2 (two) times daily. 07/13/21  Yes Rhoda Waldvogel H, PA-C  predniSONE (STERAPRED UNI-PAK 21 TAB) 10 MG (21) TBPK tablet Take by mouth daily. Take 6 tabs by mouth daily  for 2 days, then 5 tabs for 2 days, then 4 tabs for 2 days, then 3 tabs for 2 days, 2 tabs for 2 days, then 1 tab by mouth daily for 2 days 07/13/21  Yes Charli Liberatore H, PA-C  aspirin 81 MG tablet Take 81 mg by mouth every morning.     [provider]  azelastine (ASTELIN) 0.1 % nasal spray Place 1 spray into both nostrils daily as needed for allergies.     [provider]  budesonide-formoterol (SYMBICORT) 80-4.5 MCG/ACT inhaler Inhale 2 puffs into the lungs 2 (two) times daily. 10/29/18   Collene Gobble, MD  Calcium Citrate-Vitamin D (CALCIUM + D PO) Take 1 tablet by mouth every morning.     [provider]  cetirizine (ZYRTEC) 10 MG tablet Take 10 mg by mouth daily.    [provider]  diclofenac (VOLTAREN) 75 MG EC tablet Take 1 tablet by mouth twice daily 06/20/21   Abonza, Maritza, PA-C  diphenhydrAMINE (BENADRYL) 50 MG tablet Take 1 tablet (50 mg total) by mouth once for 1 dose. Take on Thursday Aug 4 2 hours prior to your procedure 06/29/21 06/30/21  Nahser, Wonda Cheng, MD  FLUoxetine (PROZAC) 20 MG capsule Take 1 capsule by mouth once daily in the morning 04/14/21   Abonza, Maritza, PA-C  fluticasone (FLONASE) 50 MCG/ACT nasal spray Place 2 sprays into both nostrils daily. 11/12/20   [name], PA-C  gabapentin (NEURONTIN) 300 MG capsule Take 1 capsule by mouth twice daily 05/31/21   Lorrene Reid, PA-C   hydrochlorothiazide (MICROZIDE) 12.5 MG capsule Take 1 capsule by mouth once daily 04/26/21   Abonza, Maritza, PA-C  ipratropium-albuterol (DUONEB) 0.5-2.5 (3) MG/3ML SOLN Inhale 3 mLs into the lungs daily as needed (wheezing).  06/25/17   [provider]  lovastatin (MEVACOR) 20 MG tablet TAKE 1 TABLET BY MOUTH AT BEDTIME 04/26/21   Abonza, Maritza, PA-C  metFORMIN (GLUCOPHAGE) 1000 MG tablet TAKE 1 TABLET BY MOUTH TWICE DAILY WITH A MEAL 05/10/21   Abonza, Maritza, PA-C  nitroGLYCERIN (NITROSTAT) 0.4 MG SL tablet Place 1 tablet (0.4 mg total) under the tongue every 5 (five) minutes as needed for chest pain. 06/29/21   Nahser, Wonda Cheng, MD  Omega-3 Fatty Acids (FISH OIL) 1000 MG CAPS Take 1,000 mg by mouth every morning.  [provider]  OZEMPIC, 1 MG/DOSE, 4 MG/3ML SOPN INJECT 0.75 MLS (1 MG TOTAL) INTO THE SKIN ONCE A WEEK (USE ONLY ONCE LOWER DOSES ARE FINISHED) 04/21/21   Abonza, Maritza, PA-C  pantoprazole (PROTONIX) 40 MG tablet Take 1 tablet (40 mg total) by mouth daily. 10/29/18   Collene Gobble, MD  TRESIBA FLEXTOUCH 100 UNIT/ML FlexTouch Pen INJECT 12 UNITS SUBCUTANEOUSLY ONCE DAILY 04/05/21   Lorrene Reid, PA-C  valsartan (DIOVAN) 160 MG tablet Take 1 tablet by mouth once daily 04/26/21   Abonza, Maritza, PA-C  verapamil (CALAN-SR) 240 MG CR tablet TAKE 1 TABLET BY MOUTH ONCE DAILY IN THE MORNING 04/14/21   Abonza, Maritza, PA-C    Allergies    Iohexol, Iodinated diagnostic agents, and Jardiance [empagliflozin]  Review of Systems   Review of Systems  Constitutional:  Negative for chills, fatigue and fever.  Respiratory:  Negative for chest tightness and shortness of breath.   Cardiovascular:  Negative for chest pain and palpitations.  Musculoskeletal:  Positive for arthralgias and back pain.  Neurological:  Negative for weakness and numbness.  All other systems reviewed and are negative.  Physical Exam Updated Vital Signs BP (!) 158/99 (BP Location: Right Arm)    Pulse 77   Temp 98.5 F (36.9 C) (Oral)   Resp 17   SpO2 99%   Physical Exam Vitals and nursing note reviewed.  Constitutional:      General: She is not in acute distress.    Appearance: Normal appearance.  HENT:     Head: Normocephalic and atraumatic.  Eyes:     General:        Right eye: No discharge.        Left eye: No discharge.  Neck:     Comments: No midline cervical spinal tenderness Cardiovascular:     Rate and Rhythm: Normal rate and regular rhythm.     Heart sounds: No murmur heard.   No friction rub. No gallop.     Comments: 2+ radial and ulnar pulses in BIL UE Pulmonary:     Effort: Pulmonary effort is normal. No respiratory distress.     Breath sounds: Normal breath sounds.  Abdominal:     General: Bowel sounds are normal.     Palpations: Abdomen is soft.  Musculoskeletal:     Cervical back: Normal range of motion and neck supple. No rigidity.     Comments: Pain with passive and active movement of elbow and shoulder joints on left side. Strength 5/5 in BIL UE and LE. Tenderness to palpation along superior scapular spine and at head of humerus. No tenderness over midline lumbar spine. Pain to palpation in paraspinal muscles of lower back.  Skin:    General: Skin is warm and dry.     Capillary Refill: Capillary refill takes less than 2 seconds.  Neurological:     Mental Status: She is alert and oriented to person, place, and time.     Cranial Nerves: No cranial nerve deficit.     Sensory: No sensory deficit.     Motor: No weakness.     Deep Tendon Reflexes: Reflexes normal.  Psychiatric:        Mood and Affect: Mood normal.        Behavior: Behavior normal.    ED Results / Procedures / Treatments   Labs (all labs ordered are listed, but only abnormal results are displayed) Labs Reviewed  CBC WITH DIFFERENTIAL/PLATELET - Abnormal; Notable for the following components:  Result Value   WBC 11.4 (*)    Neutro Abs 7.8 (*)    All other components  within normal limits  COMPREHENSIVE METABOLIC PANEL - Abnormal; Notable for the following components:   Glucose, Bld 121 (*)    Creatinine, Ser 1.28 (*)    Albumin 3.3 (*)    GFR, Estimated 46 (*)    All other components within normal limits  TROPONIN I (HIGH SENSITIVITY)  TROPONIN I (HIGH SENSITIVITY)    EKG EKG Interpretation  Date/Time:  Tuesday July 12 2021 17:31:40 EDT Ventricular Rate:  73 PR Interval:  146 QRS Duration: 130 QT Interval:  416 QTC Calculation: 458 R Axis:   -7 Text Interpretation: Normal sinus rhythm Left bundle branch block Abnormal ECG No significant change since last tracing Confirmed by Deno Etienne [phone]) on 07/13/2021 12:13:41 AM  Radiology DG Chest 1 View  Result Date: 07/12/2021 CLINICAL DATA:  Left arm pain EXAM: CHEST  1 VIEW COMPARISON:  08/10/2019 FINDINGS: Chronic elevation left diaphragm. No focal opacity or pleural effusion. Normal cardiac size. No pneumothorax IMPRESSION: No active disease. Electronically Signed   By: Donavan Foil M.D.   On: 07/12/2021 18:09    Procedures Procedures   Medications Ordered in ED Medications  ketorolac (TORADOL) 15 MG/ML injection 15 mg (15 mg Intramuscular Given 07/13/21 0049)  methocarbamol (ROBAXIN) tablet 500 mg (500 mg Oral Given 07/13/21 0048)    ED Course  I have reviewed the triage vital signs and the nursing notes.  Pertinent labs & imaging results that were available during my care of the patient were reviewed by me and considered in my medical decision making (see chart for details).    MDM Rules/Calculators/A&P                         History and physical exam consistent with cervical radiculopathy. Incidental finding of lumbar back pain without radiation or sciatica symptoms is consistent with previous history. Patient without weakness, grossly neurovascularly intact in upper and lower extremities. Patient without red flags of back pain including fever, IVDU, history of cancer. Pain  controlled with IM toradol and oral robaxin. Rx for prednisone taper, muscle relaxant, voltaren gel given for outpatient management. Patient to follow up with orthopedics.  Extensive return precautions discussed and patient expressed understanding. Final Clinical Impression(s) / ED Diagnoses Final diagnoses:  Pain  Acute midline low back pain without sciatica  Radiculopathy, unspecified spinal region    Rx / DC Orders ED Discharge Orders          Ordered    methocarbamol (ROBAXIN) 500 MG tablet  2 times daily        07/13/21 0034    predniSONE (STERAPRED UNI-PAK 21 TAB) 10 MG (21) TBPK tablet  Daily        07/13/21 0034    diclofenac Sodium (VOLTAREN) 1 % GEL  4 times daily        07/13/21 0034             Meiah Zamudio, Joesph Fillers, PA-C 07/13/21 [place], [place], DO 07/13/21 [phone]

## 2021-07-13 NOTE — Discharge Instructions (Addendum)
We discussed that your pain appears clinically to be consistent with a cervical spine radiculopathy. I have prescribed some medications as we discussed. Please follow up with your orthopedic doctor.

## 2021-07-19 ENCOUNTER — Ambulatory Visit
Admission: EM | Admit: 2021-07-19 | Discharge: 2021-07-19 | Disposition: A | Discharge status: Home / Self Care | Payer: Medicare HMO | Attending: Internal Medicine | Admitting: Internal Medicine

## 2021-07-19 ENCOUNTER — Other Ambulatory Visit: Payer: Self-pay

## 2021-07-19 DIAGNOSIS — T1501XA Foreign body in cornea, right eye, initial encounter: Secondary | ICD-10-CM | POA: Diagnosis not present

## 2021-07-19 DIAGNOSIS — T2691XA Corrosion of right eye and adnexa, part unspecified, initial encounter: Secondary | ICD-10-CM | POA: Diagnosis not present

## 2021-07-19 NOTE — ED Triage Notes (Signed)
Pt states got Voltaren gel in her rt eye yesterday. States has flushed rt eye x3 with no relief. States can't see out of rt eye.

## 2021-07-19 NOTE — Discharge Instructions (Addendum)
Please go to provided address now to be evaluated by an eye doctor.

## 2021-07-19 NOTE — ED Provider Notes (Signed)
[name]    CSN: [id number] Arrival date & time: 07/19/21  1136      History   Chief Complaint Chief Complaint  Patient presents with   Eye Pain    HPI Samantha [name] is a 65 y.o. female.   Patient presents for further evaluation after getting voltaren gel in her right eye yesterday. States that she was applying the medication to her neck and accidentally rubbed her eye afterward. Patient has flushed eye out three times at home with no resolution of eye irritation. Patient is not able to see out of eye and is having difficulty opening eye. Patient wears glasses and contacts at baseline.    Eye Pain   Past Medical History:  Diagnosis Date   Arthritis    Dyslipidemia    Heart disease    Heart murmur 2011   Left bundle branch block 2011   Type II or unspecified type diabetes mellitus without mention of complication, not stated as uncontrolled    Unspecified essential hypertension     Patient Active Problem List   Diagnosis Date Noted   Unstable angina ([place])    Arthritis 01/14/2021   Bowel incontinence 01/14/2021   ETD (eustachian tube dysfunction) 01/14/2021   Left lower quadrant pain 01/14/2021   Personal history of colonic polyps 01/14/2021   Screening for malignant neoplasm of colon 01/14/2021   Hyperlipidemia associated with type 2 diabetes mellitus ([place]) 02/23/2020   Microalbuminuria due to type 2 diabetes mellitus ([place]) 02/23/2020   Type 2 diabetes mellitus with morbid obesity ([place]) 02/23/2020   B12 deficiency 02/23/2020   Vitamin D deficiency 02/23/2020   Family history of heart disease 02/23/2020   Family history of diabetes mellitus (DM) 02/23/2020   Family history of colon cancer-grandmother 02/23/2020   Family history of mixed hyperlipidemia 02/23/2020   Family history of hypertension 02/23/2020   Mood disorder ([place]) 02/23/2020   Degenerative disc disease, lumbar 07/19/2018   Spinal stenosis of lumbar region with neurogenic  claudication 07/19/2018   Spondylosis of lumbar spine 07/19/2018   Lumbar radiculopathy 07/05/2018   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/21/2018   Eustachian tube dysfunction, bilateral 02/21/2018   Recurrent sinusitis 02/21/2018   Tympanic membrane retraction, left 02/21/2018   GERD (gastroesophageal reflux disease) 10/24/2017   Cough 08/14/2017   Dyspnea 08/14/2017   Pulmonary nodule, right 08/14/2017   Back pain 07/12/2017   Obesity (BMI 30-39.9) 06/11/2017   LBBB (left bundle branch block) 08/10/2016   Leukocytosis 08/10/2016   Hypertension associated with diabetes ([place]) 08/10/2016   Pain in the chest 08/09/2016   Diabetes ([place]) 11/20/2015    Past Surgical History:  Procedure Laterality Date   LEFT HEART CATH AND CORONARY ANGIOGRAPHY N/A 06/30/2021   Procedure: LEFT HEART CATH AND CORONARY ANGIOGRAPHY;  Surgeon: Troy Sine, MD;  Location: [place] [facility];  Service: Cardiovascular;  Laterality: N/A;   REPLACEMENT TOTAL KNEE Left 2017   TONSILLECTOMY  1962    OB History   No obstetric history on file.      Home Medications    Prior to Admission medications   Medication Sig Start Date End Date Taking? Authorizing Provider  aspirin 81 MG tablet Take 81 mg by mouth every morning.     [provider]  azelastine (ASTELIN) 0.1 % nasal spray Place 1 spray into both nostrils daily as needed for allergies.     [provider]  budesonide-formoterol (SYMBICORT) 80-4.5 MCG/ACT inhaler Inhale 2 puffs into  the lungs 2 (two) times daily. 10/29/18   Collene Gobble, MD  Calcium Citrate-Vitamin D (CALCIUM + D PO) Take 1 tablet by mouth every morning.     [provider]  cetirizine (ZYRTEC) 10 MG tablet Take 10 mg by mouth daily.    [provider]  diclofenac (VOLTAREN) 75 MG EC tablet Take 1 tablet by mouth twice daily 06/20/21   Abonza, Maritza, PA-C  diclofenac Sodium (VOLTAREN) 1 % GEL Apply 2 g topically 4 (four) times  daily. 07/13/21   Prosperi, Christian H, PA-C  diphenhydrAMINE (BENADRYL) 50 MG tablet Take 1 tablet (50 mg total) by mouth once for 1 dose. Take on Thursday Aug 4 2 hours prior to your procedure 06/29/21 06/30/21  Nahser, Wonda Cheng, MD  FLUoxetine (PROZAC) 20 MG capsule Take 1 capsule by mouth once daily in the morning 04/14/21   Abonza, Maritza, PA-C  fluticasone (FLONASE) 50 MCG/ACT nasal spray Place 2 sprays into both nostrils daily. 11/12/20   [name], PA-C  gabapentin (NEURONTIN) 300 MG capsule Take 1 capsule by mouth twice daily 05/31/21   Lorrene Reid, PA-C  hydrochlorothiazide (MICROZIDE) 12.5 MG capsule Take 1 capsule by mouth once daily 04/26/21   Abonza, Maritza, PA-C  ipratropium-albuterol (DUONEB) 0.5-2.5 (3) MG/3ML SOLN Inhale 3 mLs into the lungs daily as needed (wheezing).  06/25/17   [provider]  lovastatin (MEVACOR) 20 MG tablet TAKE 1 TABLET BY MOUTH AT BEDTIME 04/26/21   Abonza, Maritza, PA-C  metFORMIN (GLUCOPHAGE) 1000 MG tablet TAKE 1 TABLET BY MOUTH TWICE DAILY WITH A MEAL 05/10/21   Abonza, Maritza, PA-C  methocarbamol (ROBAXIN) 500 MG tablet Take 1 tablet (500 mg total) by mouth 2 (two) times daily. 07/13/21   Prosperi, Christian H, PA-C  nitroGLYCERIN (NITROSTAT) 0.4 MG SL tablet Place 1 tablet (0.4 mg total) under the tongue every 5 (five) minutes as needed for chest pain. 06/29/21   Nahser, Wonda Cheng, MD  Omega-3 Fatty Acids (FISH OIL) 1000 MG CAPS Take 1,000 mg by mouth every morning.     [provider]  OZEMPIC, 1 MG/DOSE, 4 MG/3ML SOPN INJECT 0.75 MLS (1 MG TOTAL) INTO THE SKIN ONCE A WEEK (USE ONLY ONCE LOWER DOSES ARE FINISHED) 04/21/21   Abonza, Maritza, PA-C  pantoprazole (PROTONIX) 40 MG tablet Take 1 tablet (40 mg total) by mouth daily. 10/29/18   Collene Gobble, MD  predniSONE (STERAPRED UNI-PAK 21 TAB) 10 MG (21) TBPK tablet Take by mouth daily. Take 6 tabs by mouth daily  for 2 days, then 5 tabs for 2 days, then 4 tabs for 2 days, then 3 tabs for 2  days, 2 tabs for 2 days, then 1 tab by mouth daily for 2 days 07/13/21   Prosperi, Joesph Fillers, PA-C  TRESIBA FLEXTOUCH 100 UNIT/ML FlexTouch Pen INJECT 12 UNITS SUBCUTANEOUSLY ONCE DAILY 04/05/21   Lorrene Reid, PA-C  valsartan (DIOVAN) 160 MG tablet Take 1 tablet by mouth once daily 04/26/21   Abonza, Maritza, PA-C  verapamil (CALAN-SR) 240 MG CR tablet TAKE 1 TABLET BY MOUTH ONCE DAILY IN THE MORNING 04/14/21   Lorrene Reid, PA-C    Family History Family History  Problem Relation Age of Onset   Diabetes Mother    Hypertension Mother    CAD Mother    Hypertension Father    Diabetes Father    CAD Father    Diabetes Brother    Hyperlipidemia Brother    Hypertension Brother    CAD Brother  Diabetes Sister    Hypertension Sister     Social History Social History   Tobacco Use   Smoking status: Never   Smokeless tobacco: Never  Vaping Use   Vaping Use: Never used  Substance Use Topics   Alcohol use: No   Drug use: No     Allergies   Iohexol, Iodinated diagnostic agents, and Jardiance [empagliflozin]   Review of Systems Review of Systems Per HPI  Physical Exam Triage Vital Signs ED Triage Vitals [07/19/21 1337]  Enc Vitals Group     BP (!) 159/83     Pulse Rate 71     Resp 18     Temp 97.7 F (36.5 C)     Temp Source Oral     SpO2 94 %     Weight      Height      Head Circumference      Peak Flow      Pain Score 10     Pain Loc      Pain Edu?      Excl. in [place]?    No data found.  Updated Vital Signs BP (!) 159/83 (BP Location: Left Arm)   Pulse 71   Temp 97.7 F (36.5 C) (Oral)   Resp 18   SpO2 94%   Visual Acuity Right Eye Distance: 20/200 Left Eye Distance: 20/70 Bilateral Distance: 20/100  Right Eye Near:   Left Eye Near:    Bilateral Near:     Physical Exam Constitutional:      Appearance: Normal appearance.  HENT:     Head: Normocephalic and atraumatic.  Eyes:     General: Lids are everted, no foreign bodies appreciated.  Gaze aligned appropriately.        Right eye: No foreign body or discharge.        Left eye: No foreign body or discharge.     Extraocular Movements: Extraocular movements intact.     Conjunctiva/sclera: Conjunctivae normal.     Comments: Outer surface of upper right eye lid is erythematous and mildly edematous. No avert irritation, abrasion, or foreign body present to right eye. No drainage noted.   Pulmonary:     Effort: Pulmonary effort is normal.  Neurological:     General: No focal deficit present.     Mental Status: She is alert and oriented to person, place, and time. Mental status is at baseline.  Psychiatric:        Mood and Affect: Mood normal.        Behavior: Behavior normal.        Thought Content: Thought content normal.        Judgment: Judgment normal.     UC Treatments / Results  Labs (all labs ordered are listed, but only abnormal results are displayed) Labs Reviewed - No data to display  EKG   Radiology No results found.  Procedures Procedures (including critical care time)  Medications Ordered in UC Medications - No data to display  Initial Impression / Assessment and Plan / UC Course  I have reviewed the triage vital signs and the nursing notes.  Pertinent labs & imaging results that were available during my care of the patient were reviewed by me and considered in my medical decision making (see chart for details).     Right eye was irrigated and flushed profusely. Patient has severely abnormal visual acuity. [facility] and set up appointment for patient to be seen and evaluated by  eye doctor today. Patient was agreeable with plan and left to go to the eye doctor.  Final Clinical Impressions(s) / UC Diagnoses   Final diagnoses:  Chemical injury of right eye     Discharge Instructions      Please go to provided address now to be evaluated by an eye doctor.     ED Prescriptions   None    PDMP not reviewed this  encounter.   Odis Luster, [place] 07/19/21 1447

## 2021-07-21 ENCOUNTER — Other Ambulatory Visit: Payer: Self-pay | Admitting: Physician Assistant

## 2021-07-21 ENCOUNTER — Other Ambulatory Visit: Payer: Self-pay | Admitting: Emergency Medicine

## 2021-07-21 DIAGNOSIS — M5412 Radiculopathy, cervical region: Secondary | ICD-10-CM | POA: Diagnosis not present

## 2021-07-21 DIAGNOSIS — M47812 Spondylosis without myelopathy or radiculopathy, cervical region: Secondary | ICD-10-CM | POA: Diagnosis not present

## 2021-07-21 DIAGNOSIS — R52 Pain, unspecified: Secondary | ICD-10-CM | POA: Diagnosis not present

## 2021-07-26 ENCOUNTER — Other Ambulatory Visit: Payer: Self-pay | Admitting: Physician Assistant

## 2021-07-26 ENCOUNTER — Telehealth: Payer: Self-pay | Admitting: Physician Assistant

## 2021-07-26 DIAGNOSIS — K219 Gastro-esophageal reflux disease without esophagitis: Secondary | ICD-10-CM

## 2021-07-26 MED ORDER — PANTOPRAZOLE SODIUM 40 MG PO TBEC: 40.0000 mg | DELAYED_RELEASE_TABLET | Freq: Every day | ORAL | 0 refills | Status: DC

## 2021-07-26 NOTE — Telephone Encounter (Signed)
This medication is not prescribed by [facility]. Please have patient reach out to prescribing provider. AS, CMA

## 2021-07-26 NOTE — Telephone Encounter (Signed)
Patient would like a refill on Protonix and uses [facility] in [place], thanks.

## 2021-07-28 ENCOUNTER — Other Ambulatory Visit: Payer: Self-pay | Admitting: Otolaryngology

## 2021-07-28 DIAGNOSIS — K1123 Chronic sialoadenitis: Secondary | ICD-10-CM | POA: Diagnosis not present

## 2021-07-28 DIAGNOSIS — R221 Localized swelling, mass and lump, neck: Secondary | ICD-10-CM

## 2021-07-28 DIAGNOSIS — H7201 Central perforation of tympanic membrane, right ear: Secondary | ICD-10-CM | POA: Diagnosis not present

## 2021-07-29 DIAGNOSIS — M5012 Mid-cervical disc disorder, unspecified level: Secondary | ICD-10-CM | POA: Diagnosis not present

## 2021-07-29 DIAGNOSIS — M6281 Muscle weakness (generalized): Secondary | ICD-10-CM | POA: Diagnosis not present

## 2021-08-01 ENCOUNTER — Emergency Department (HOSPITAL_COMMUNITY): Payer: Medicare HMO

## 2021-08-01 ENCOUNTER — Other Ambulatory Visit: Payer: Self-pay

## 2021-08-01 ENCOUNTER — Emergency Department (HOSPITAL_COMMUNITY)
Admission: EM | Admit: 2021-08-01 | Discharge: 2021-08-01 | Disposition: A | Discharge status: Home / Self Care | Payer: Medicare HMO | Attending: Emergency Medicine | Admitting: Emergency Medicine

## 2021-08-01 DIAGNOSIS — E1165 Type 2 diabetes mellitus with hyperglycemia: Secondary | ICD-10-CM | POA: Diagnosis not present

## 2021-08-01 DIAGNOSIS — R1031 Right lower quadrant pain: Secondary | ICD-10-CM | POA: Insufficient documentation

## 2021-08-01 DIAGNOSIS — I898 Other specified noninfective disorders of lymphatic vessels and lymph nodes: Secondary | ICD-10-CM | POA: Diagnosis not present

## 2021-08-01 DIAGNOSIS — M5459 Other low back pain: Secondary | ICD-10-CM | POA: Diagnosis not present

## 2021-08-01 DIAGNOSIS — M545 Low back pain, unspecified: Secondary | ICD-10-CM | POA: Diagnosis not present

## 2021-08-01 DIAGNOSIS — Z7982 Long term (current) use of aspirin: Secondary | ICD-10-CM | POA: Diagnosis not present

## 2021-08-01 DIAGNOSIS — M47814 Spondylosis without myelopathy or radiculopathy, thoracic region: Secondary | ICD-10-CM | POA: Diagnosis not present

## 2021-08-01 DIAGNOSIS — R112 Nausea with vomiting, unspecified: Secondary | ICD-10-CM

## 2021-08-01 DIAGNOSIS — Z7952 Long term (current) use of systemic steroids: Secondary | ICD-10-CM | POA: Insufficient documentation

## 2021-08-01 DIAGNOSIS — E119 Type 2 diabetes mellitus without complications: Secondary | ICD-10-CM | POA: Insufficient documentation

## 2021-08-01 DIAGNOSIS — Z79899 Other long term (current) drug therapy: Secondary | ICD-10-CM | POA: Diagnosis not present

## 2021-08-01 DIAGNOSIS — Z7984 Long term (current) use of oral hypoglycemic drugs: Secondary | ICD-10-CM | POA: Diagnosis not present

## 2021-08-01 DIAGNOSIS — R1030 Lower abdominal pain, unspecified: Secondary | ICD-10-CM | POA: Diagnosis not present

## 2021-08-01 DIAGNOSIS — Z96652 Presence of left artificial knee joint: Secondary | ICD-10-CM | POA: Diagnosis not present

## 2021-08-01 DIAGNOSIS — R1111 Vomiting without nausea: Secondary | ICD-10-CM | POA: Diagnosis not present

## 2021-08-01 DIAGNOSIS — I10 Essential (primary) hypertension: Secondary | ICD-10-CM | POA: Diagnosis not present

## 2021-08-01 DIAGNOSIS — K5730 Diverticulosis of large intestine without perforation or abscess without bleeding: Secondary | ICD-10-CM | POA: Diagnosis not present

## 2021-08-01 LAB — CBC WITH DIFFERENTIAL/PLATELET
Abs Immature Granulocytes: 0.04 10*3/uL (ref 0.00–0.07)
Basophils Absolute: 0.1 10*3/uL (ref 0.0–0.1)
Basophils Relative: 1 %
Eosinophils Absolute: 0.2 10*3/uL (ref 0.0–0.5)
Eosinophils Relative: 1 %
HCT: 36.8 % (ref 36.0–46.0)
Hemoglobin: 11.8 g/dL — ABNORMAL LOW (ref 12.0–15.0)
Immature Granulocytes: 0 %
Lymphocytes Relative: 13 %
Lymphs Abs: 1.9 10*3/uL (ref 0.7–4.0)
MCH: 29 pg (ref 26.0–34.0)
MCHC: 32.1 g/dL (ref 30.0–36.0)
MCV: 90.4 fL (ref 80.0–100.0)
Monocytes Absolute: 0.8 10*3/uL (ref 0.1–1.0)
Monocytes Relative: 5 %
Neutro Abs: 11.7 10*3/uL — ABNORMAL HIGH (ref 1.7–7.7)
Neutrophils Relative %: 80 %
Platelets: 319 10*3/uL (ref 150–400)
RBC: 4.07 MIL/uL (ref 3.87–5.11)
RDW: 13 % (ref 11.5–15.5)
WBC: 14.7 10*3/uL — ABNORMAL HIGH (ref 4.0–10.5)
nRBC: 0 % (ref 0.0–0.2)

## 2021-08-01 LAB — COMPREHENSIVE METABOLIC PANEL
ALT: 34 U/L (ref 0–44)
AST: 25 U/L (ref 15–41)
Albumin: 3.2 g/dL — ABNORMAL LOW (ref 3.5–5.0)
Alkaline Phosphatase: 51 U/L (ref 38–126)
Anion gap: 10 (ref 5–15)
BUN: 14 mg/dL (ref 8–23)
CO2: 23 mmol/L (ref 22–32)
Calcium: 9.4 mg/dL (ref 8.9–10.3)
Chloride: 102 mmol/L (ref 98–111)
Creatinine, Ser: 1.05 mg/dL — ABNORMAL HIGH (ref 0.44–1.00)
GFR, Estimated: 58 mL/min — ABNORMAL LOW (ref >60)
Glucose, Bld: 209 mg/dL — ABNORMAL HIGH (ref 70–99)
Potassium: 4 mmol/L (ref 3.5–5.1)
Sodium: 135 mmol/L (ref 135–145)
Total Bilirubin: 0.6 mg/dL (ref 0.3–1.2)
Total Protein: 6.4 g/dL — ABNORMAL LOW (ref 6.5–8.1)

## 2021-08-01 LAB — URINALYSIS, ROUTINE W REFLEX MICROSCOPIC
Bilirubin Urine: NEGATIVE
Glucose, UA: NEGATIVE mg/dL
Hgb urine dipstick: NEGATIVE
Ketones, ur: NEGATIVE mg/dL
Leukocytes,Ua: NEGATIVE
Nitrite: NEGATIVE
Protein, ur: NEGATIVE mg/dL
Specific Gravity, Urine: 1.008 (ref 1.005–1.030)
pH: 6 (ref 5.0–8.0)

## 2021-08-01 MED ORDER — HYDROMORPHONE HCL 1 MG/ML IJ SOLN
1.0000 mg | Freq: Once | INTRAMUSCULAR | Status: AC
Start: 2021-08-01 — End: 2021-08-01
  Administered 2021-08-01: 1 mg via INTRAVENOUS
  Filled 2021-08-01: qty 1

## 2021-08-01 MED ORDER — HYDROMORPHONE HCL 1 MG/ML IJ SOLN
1.0000 mg | Freq: Once | INTRAMUSCULAR | Status: AC
  Administered 2021-08-01: 1 mg via INTRAVENOUS
  Filled 2021-08-01: qty 1

## 2021-08-01 MED ORDER — SODIUM CHLORIDE 0.9 % IV BOLUS
500.0000 mL | Freq: Once | INTRAVENOUS | Status: AC
  Administered 2021-08-01: 500 mL via INTRAVENOUS

## 2021-08-01 MED ORDER — FENTANYL CITRATE PF 50 MCG/ML IJ SOSY
50.0000 ug | PREFILLED_SYRINGE | Freq: Once | INTRAMUSCULAR | Status: AC
  Administered 2021-08-01: 50 ug via INTRAVENOUS
  Filled 2021-08-01: qty 1

## 2021-08-01 MED ORDER — ONDANSETRON HCL 4 MG/2ML IJ SOLN
4.0000 mg | Freq: Once | INTRAMUSCULAR | Status: AC
  Administered 2021-08-01: 4 mg via INTRAVENOUS
  Filled 2021-08-01: qty 2

## 2021-08-01 NOTE — ED Provider Notes (Signed)
[facility] EMERGENCY DEPARTMENT Provider Note   CSN: [id number] Arrival date & time: 08/01/21  0747     History No chief complaint on file.   Samantha [name] is a 65 y.o. female.  States she has had sciatica in the past, and this is a different sort of pain.  The history is provided by the patient.  Back Pain Location:  Lumbar spine (midline, low lumbar spine) Quality:  Stabbing Radiates to: lower abdomen. Pain severity:  Severe Pain is:  Same all the time Onset quality:  Gradual Duration:  1 day (awoke with pain yesterday) Timing:  Constant Progression:  Worsening Chronicity:  New Context: not recent illness and not recent injury   Relieved by:  Nothing Worsened by:  Nothing Ineffective treatments:  OTC medications (tried tylenol) Associated symptoms: abdominal pain   Associated symptoms: no bladder incontinence, no bowel incontinence, no chest pain, no dysuria, no fever, no leg pain, no numbness, no paresthesias, no tingling and no weakness       Past Medical History:  Diagnosis Date   Arthritis    Dyslipidemia    Heart disease    Heart murmur 2011   Left bundle branch block 2011   Type II or unspecified type diabetes mellitus without mention of complication, not stated as uncontrolled    Unspecified essential hypertension     Patient Active Problem List   Diagnosis Date Noted   Unstable angina ([place])    Arthritis 01/14/2021   Bowel incontinence 01/14/2021   ETD (eustachian tube dysfunction) 01/14/2021   Left lower quadrant pain 01/14/2021   Personal history of colonic polyps 01/14/2021   Screening for malignant neoplasm of colon 01/14/2021   Hyperlipidemia associated with type 2 diabetes mellitus ([place]) 02/23/2020   Microalbuminuria due to type 2 diabetes mellitus ([place]) 02/23/2020   Type 2 diabetes mellitus with morbid obesity ([place]) 02/23/2020   B12 deficiency 02/23/2020   Vitamin D deficiency 02/23/2020   Family history of heart  disease 02/23/2020   Family history of diabetes mellitus (DM) 02/23/2020   Family history of colon cancer-grandmother 02/23/2020   Family history of mixed hyperlipidemia 02/23/2020   Family history of hypertension 02/23/2020   Mood disorder ([place]) 02/23/2020   Degenerative disc disease, lumbar 07/19/2018   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   Spondylosis of lumbar spine 07/19/2018   Lumbar radiculopathy 07/05/2018   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/21/2018   Eustachian tube dysfunction, bilateral 02/21/2018   Recurrent sinusitis 02/21/2018   Tympanic membrane retraction, left 02/21/2018   GERD (gastroesophageal reflux disease) 10/24/2017   Cough 08/14/2017   Dyspnea 08/14/2017   Pulmonary nodule, right 08/14/2017   Back pain 07/12/2017   Obesity (BMI 30-39.9) 06/11/2017   LBBB (left bundle branch block) 08/10/2016   Leukocytosis 08/10/2016   Hypertension associated with diabetes ([place]) 08/10/2016   Pain in the chest 08/09/2016   Diabetes ([place]) 11/20/2015    Past Surgical History:  Procedure Laterality Date   LEFT HEART CATH AND CORONARY ANGIOGRAPHY N/A 06/30/2021   Procedure: LEFT HEART CATH AND CORONARY ANGIOGRAPHY;  Surgeon: Troy Sine, MD;  Location: [facility];  Service: Cardiovascular;  Laterality: N/A;   REPLACEMENT TOTAL KNEE Left 2017   TONSILLECTOMY  1962     OB History   No obstetric history on file.     Family History  Problem Relation Age of Onset   Diabetes Mother    Hypertension Mother  CAD Mother    Hypertension Father    Diabetes Father    CAD Father    Diabetes Brother    Hyperlipidemia Brother    Hypertension Brother    CAD Brother    Diabetes Sister    Hypertension Sister     Social History   Tobacco Use   Smoking status: Never   Smokeless tobacco: Never  Vaping Use   Vaping Use: Never used  Substance Use Topics   Alcohol use: No   Drug use: No    Home  Medications Prior to Admission medications   Medication Sig Start Date End Date Taking? Authorizing Provider  aspirin 81 MG tablet Take 81 mg by mouth every morning.     [provider]  azelastine (ASTELIN) 0.1 % nasal spray Place 1 spray into both nostrils daily as needed for allergies.     [provider]  budesonide-formoterol (SYMBICORT) 80-4.5 MCG/ACT inhaler Inhale 2 puffs into the lungs 2 (two) times daily. 10/29/18   Collene Gobble, MD  Calcium Citrate-Vitamin D (CALCIUM + D PO) Take 1 tablet by mouth every morning.     [provider]  cetirizine (ZYRTEC) 10 MG tablet Take 10 mg by mouth daily.    [provider]  diclofenac (VOLTAREN) 75 MG EC tablet Take 1 tablet by mouth twice daily 06/20/21   Abonza, Maritza, PA-C  diclofenac Sodium (VOLTAREN) 1 % GEL Apply 2 g topically 4 (four) times daily. 07/13/21   Prosperi, Christian H, PA-C  diphenhydrAMINE (BENADRYL) 50 MG tablet Take 1 tablet (50 mg total) by mouth once for 1 dose. Take on Thursday Aug 4 2 hours prior to your procedure 06/29/21 06/30/21  Nahser, Wonda Cheng, MD  FLUoxetine (PROZAC) 20 MG capsule Take 1 capsule by mouth once daily in the morning 07/21/21   Abonza, Maritza, PA-C  fluticasone (FLONASE) 50 MCG/ACT nasal spray Place 2 sprays into both nostrils daily. 11/12/20   [name], PA-C  gabapentin (NEURONTIN) 300 MG capsule Take 1 capsule by mouth twice daily 05/31/21   Lorrene Reid, PA-C  hydrochlorothiazide (MICROZIDE) 12.5 MG capsule Take 1 capsule by mouth once daily 04/26/21   Abonza, Maritza, PA-C  ipratropium-albuterol (DUONEB) 0.5-2.5 (3) MG/3ML SOLN Inhale 3 mLs into the lungs daily as needed (wheezing).  06/25/17   [provider]  lovastatin (MEVACOR) 20 MG tablet TAKE 1 TABLET BY MOUTH AT BEDTIME 04/26/21   Abonza, Maritza, PA-C  metFORMIN (GLUCOPHAGE) 1000 MG tablet TAKE 1 TABLET BY MOUTH TWICE DAILY WITH A MEAL 05/10/21   Abonza, Maritza, PA-C  methocarbamol (ROBAXIN) 500 MG  tablet Take 1 tablet (500 mg total) by mouth 2 (two) times daily. 07/13/21   Prosperi, Christian H, PA-C  nitroGLYCERIN (NITROSTAT) 0.4 MG SL tablet Place 1 tablet (0.4 mg total) under the tongue every 5 (five) minutes as needed for chest pain. 06/29/21   Nahser, Wonda Cheng, MD  Omega-3 Fatty Acids (FISH OIL) 1000 MG CAPS Take 1,000 mg by mouth every morning.     [provider]  OZEMPIC, 1 MG/DOSE, 4 MG/3ML SOPN INJECT 0.75 MLS (1 MG TOTAL) INTO THE SKIN ONCE A WEEK (USE ONLY ONCE LOWER DOSES ARE FINISHED) 04/21/21   Abonza, Maritza, PA-C  pantoprazole (PROTONIX) 40 MG tablet Take 1 tablet (40 mg total) by mouth daily. 07/26/21   Lorrene Reid, PA-C  predniSONE (STERAPRED UNI-PAK 21 TAB) 10 MG (21) TBPK tablet Take by mouth daily. Take 6 tabs by mouth daily  for 2 days,  then 5 tabs for 2 days, then 4 tabs for 2 days, then 3 tabs for 2 days, 2 tabs for 2 days, then 1 tab by mouth daily for 2 days 07/13/21   Prosperi, Joesph Fillers, PA-C  TRESIBA FLEXTOUCH 100 UNIT/ML FlexTouch Pen INJECT 12 UNITS SUBCUTANEOUSLY ONCE DAILY 04/05/21   Lorrene Reid, PA-C  valsartan (DIOVAN) 160 MG tablet Take 1 tablet by mouth once daily 04/26/21   Abonza, Maritza, PA-C  verapamil (CALAN-SR) 240 MG CR tablet TAKE 1 TABLET BY MOUTH ONCE DAILY IN THE MORNING 07/21/21   Lorrene Reid, PA-C    Allergies    Iohexol, Iodinated diagnostic agents, and Jardiance [empagliflozin]  Review of Systems   Review of Systems  Constitutional:  Negative for chills and fever.  HENT:  Negative for ear pain and sore throat.   Eyes:  Negative for pain and visual disturbance.  Respiratory:  Negative for cough and shortness of breath.   Cardiovascular:  Negative for chest pain and palpitations.  Gastrointestinal:  Positive for abdominal pain, diarrhea, nausea and vomiting. Negative for blood in stool and bowel incontinence.  Genitourinary:  Negative for bladder incontinence, dysuria and hematuria.  Musculoskeletal:  Positive for back  pain. Negative for arthralgias.  Skin:  Negative for color change and rash.  Neurological:  Negative for tingling, seizures, syncope, weakness, numbness and paresthesias.  All other systems reviewed and are negative.  Physical Exam Updated Vital Signs There were no vitals taken for this visit.  Physical Exam Vitals and nursing note reviewed.  Constitutional:      General: She is in acute distress.     Appearance: She is well-developed. She is diaphoretic.     Comments: Appears to be in pain  HENT:     Head: Normocephalic and atraumatic.  Cardiovascular:     Rate and Rhythm: Normal rate and regular rhythm.     Heart sounds: Normal heart sounds.  Pulmonary:     Effort: Pulmonary effort is normal. No tachypnea.     Breath sounds: Normal breath sounds.  Abdominal:     Palpations: Abdomen is soft.     Tenderness: There is abdominal tenderness (mild, RLQ). There is no right CVA tenderness or left CVA tenderness.  Musculoskeletal:       Back:     Right lower leg: No edema.     Left lower leg: No edema.     Comments: Tender to palpation at the low lumbar spine Mild increase in pain with low back ROM  Skin:    General: Skin is warm.  Neurological:     General: No focal deficit present.     Mental Status: She is alert and oriented to person, place, and time.  Psychiatric:        Mood and Affect: Mood normal.        Behavior: Behavior normal.    ED Results / Procedures / Treatments   Labs (all labs ordered are listed, but only abnormal results are displayed) Labs Reviewed  CBC WITH DIFFERENTIAL/PLATELET - Abnormal; Notable for the following components:      Result Value   WBC 14.7 (*)    Hemoglobin 11.8 (*)    Neutro Abs 11.7 (*)    All other components within normal limits  COMPREHENSIVE METABOLIC PANEL - Abnormal; Notable for the following components:   Glucose, Bld 209 (*)    Creatinine, Ser 1.05 (*)    Total Protein 6.4 (*)    Albumin 3.2 (*)  GFR, Estimated 58  (*)    All other components within normal limits  URINALYSIS, ROUTINE W REFLEX MICROSCOPIC - Abnormal; Notable for the following components:   Color, Urine STRAW (*)    All other components within normal limits    EKG EKG Interpretation  Date/Time:  Monday August 01 2021 07:55:53 EDT Ventricular Rate:  72 PR Interval:  143 QRS Duration: 143 QT Interval:  442 QTC Calculation: 484 R Axis:   22 Text Interpretation: Sinus rhythm Atrial premature complex Left bundle branch block Known left bundle branch block No acute ischemia Confirmed by Lorre Munroe ([id number]) on 08/01/2021 8:11:03 AM  Radiology CT Abdomen Pelvis Wo Contrast  Result Date: 08/01/2021 CLINICAL DATA:  Flank and abdominal pain, nausea and vomiting EXAM: CT ABDOMEN AND PELVIS WITHOUT CONTRAST TECHNIQUE: Multidetector CT imaging of the abdomen and pelvis was performed following the standard protocol without IV contrast. COMPARISON:  07/30/2019 FINDINGS: Lower chest: Chronic left hemidiaphragm elevation and left lower lobe scarring. Normal heart size. No pericardial or pleural effusion. Degenerative changes of the thoracic spine. Hepatobiliary: Limited without IV contrast. No large focal hepatic abnormality or biliary dilatation. Gallbladder unremarkable. Common bile duct nondilated. Pancreas: Unremarkable. No pancreatic ductal dilatation or surrounding inflammatory changes. Spleen: Normal in size without focal abnormality. Adrenals/Urinary Tract: Adrenal glands are unremarkable. Kidneys are normal, without renal calculi, focal lesion, or hydronephrosis. Bladder is unremarkable. Stomach/Bowel: Negative for bowel obstruction, significant dilatation, ileus, or free air. Normal appendix demonstrated. Colonic diverticulosis most pronounced of the rectosigmoid colon. No acute inflammatory process. No free fluid, fluid collection, hemorrhage, hematoma, abscess or ascites. Vascular/Lymphatic: Limited without IV contrast. Negative for aneurysm.  Aortic atherosclerosis present. No retroperitoneal hemorrhage or hematoma. No bulky adenopathy. Small partially calcified retroperitoneal periaortic and pelvic iliac lymph nodes again noted. Reproductive: Uterus and bilateral adnexa are unremarkable. Other: No abdominal wall hernia or abnormality. No abdominopelvic ascites. Musculoskeletal: Degenerative changes throughout the spine. No acute osseous finding. IMPRESSION: No acute obstructing urinary tract or ureteral calculus. No acute obstructive uropathy or hydronephrosis. No other acute intra-abdominal or pelvic finding by noncontrast CT. Normal appendix Scattered colonic diverticulosis without acute inflammatory process Electronically Signed   By: Jerilynn Mages.  Shick M.D.   On: 08/01/2021 08:50    Procedures Procedures   Medications Ordered in ED Medications  fentaNYL (SUBLIMAZE) injection 50 mcg (50 mcg Intravenous Given 08/01/21 0822)  ondansetron (ZOFRAN) injection 4 mg (4 mg Intravenous Given 08/01/21 0821)  sodium chloride 0.9 % bolus 500 mL (0 mLs Intravenous Stopped 08/01/21 1059)  HYDROmorphone (DILAUDID) injection 1 mg (1 mg Intravenous Given 08/01/21 0902)  HYDROmorphone (DILAUDID) injection 1 mg (1 mg Intravenous Given 08/01/21 1058)    ED Course  I have reviewed the triage vital signs and the nursing notes.  Pertinent labs & imaging results that were available during my care of the patient were reviewed by me and considered in my medical decision making (see chart for details).    MDM Rules/Calculators/A&P                           Van Akopyan presents to the emergency department complaining of 1 day of low back pain, nausea, vomiting, and diarrhea.  She has a history of back pain, but she states that the symptoms are much different than her typical pain.  Vital signs were normal with the exception of some mild hypertension.  She was evaluated for evidence of acute intra-abdominal pathology.  She has an allergy  to IV dye, and therefore, a  noncontrast scan was ordered.  This was negative for an intra-abdominal process.  White count was very slightly elevated, but the rest of her work-up was within normal limits.  She was treated with pain medication, she will ultimately be discharged home.  She was counseled to return if symptoms are persistent.  My best guess is that this is either a gastroenteritis or possibly a musculoskeletal problem.  Abdominal exam is fairly benign, and I do not think mesenteric ischemia is as likely of a diagnosis.  No indication of UTI, pyelonephritis, intestinal obstruction, diverticulitis, renal colic.  Final Clinical Impression(s) / ED Diagnoses Final diagnoses:  Acute midline low back pain without sciatica  Non-intractable vomiting with nausea, unspecified vomiting type  Hyperglycemia due to diabetes mellitus [facility])    Rx / DC Orders ED Discharge Orders     None        Arnaldo Natal, MD 08/01/21 1148

## 2021-08-01 NOTE — ED Triage Notes (Signed)
Pt arrives with lower back pain with associated abdominal pain n/v since yesterday. Hx of sciatica and reports this is much different than her normal pain.

## 2021-08-07 ENCOUNTER — Encounter: Payer: Self-pay | Admitting: Cardiovascular Disease

## 2021-08-07 NOTE — Progress Notes (Signed)
Cardiology Office Note:    Date:  08/08/2021   ID:  Samantha [name], DOB 1956/01/09, MRN [id number]  PCP:  Samantha Reid, PA-C  Cardiologist:  Samantha [name]  Electrophysiologist:  None   Referring MD: Samantha Reid, PA-C   Chief Complaint  Patient presents with   Chest Pain    Prior notes:   Samantha [name] is a 65 y.o. female with a hx of diabetes mellitus, obesity, hypertension, left bundle branch block.  We are asked to see her today by Samantha Lords, FNP  for further evaluation of her shortness of breath.  I have seen her in the past.  Has lots of GI issues after eating .  She has lots of pain and discomfort especially after eating.  She is quite active at work.  She is never had episodes of chest pain with walking around.  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  She does have some ankle swelling. Avoids salt.   Does not walk for exercise  We performed a heart cath in 2006 which was essentially normal.  I saw her back in 2017 for left bundle branch block.  Echocardiogram at that time revealed normal left ventricular function.  Has cp related to eating.    Aug. 3, 2022: Samantha [name] is seen today for follow up of her chest pain .  Chest pain seems to be related to eating She is having lots of nausea, vomitting and dizziness  Has a LBBB  Last Friday , she was mopping the floor. Got sweaty, lightheaded Intrascapular pain  and chest pain  Happened again on Saturday  Doe not get any regular exercise  She is allergic to IV contrast - will need pre-meds for any imaging procedure like cath or CTA   Mother and sister had CABG in their 36s  Brother died at 92 due to CAD  Father had CAD in his 29s  Had a myoview in 2011 Showed an anterior defect - ? Breast artifact. Vs. MI   Setp. 12, 2022 Samantha [name] is seen today for follow up of her CP Cath on Aug. 4, 2022 showed normal coronaries. Has been found to have a pinched nerve in her neck.   This is likely to be the  cause of her left arm pain     Past Medical History:  Diagnosis Date   Arthritis    Dyslipidemia    Heart disease    Heart murmur 2011   Left bundle branch block 2011   Type II or unspecified type diabetes mellitus without mention of complication, not stated as uncontrolled    Unspecified essential hypertension     Past Surgical History:  Procedure Laterality Date   LEFT HEART CATH AND CORONARY ANGIOGRAPHY N/A 06/30/2021   Procedure: LEFT HEART CATH AND CORONARY ANGIOGRAPHY;  Surgeon: Samantha Sine, MD;  Location: [facility];  Service: Cardiovascular;  Laterality: N/A;   REPLACEMENT TOTAL KNEE Left 2017   TONSILLECTOMY  1962    Current Medications: Current Meds  Medication Sig   aspirin 81 MG tablet Take 81 mg by mouth every morning.    azelastine (ASTELIN) 0.1 % nasal spray Place 1 spray into both nostrils daily as needed for allergies.    budesonide-formoterol (SYMBICORT) 80-4.5 MCG/ACT inhaler Inhale 2 puffs into the lungs 2 (two) times daily.   Calcium Citrate-Vitamin D (CALCIUM + D PO) Take 1 tablet by mouth every morning.    cetirizine (ZYRTEC) 10 MG tablet Take 10 mg by mouth daily.  diclofenac (VOLTAREN) 75 MG EC tablet Take 1 tablet by mouth twice daily   diclofenac Sodium (VOLTAREN) 1 % GEL Apply 2 g topically 4 (four) times daily.   FLUoxetine (PROZAC) 20 MG capsule Take 1 capsule by mouth once daily in the morning   fluticasone (FLONASE) 50 MCG/ACT nasal spray Place 2 sprays into both nostrils daily.   gabapentin (NEURONTIN) 300 MG capsule Take 300 mg by mouth 2 (two) times daily. 1 capsules in AM 2 capsules in PM   hydrochlorothiazide (MICROZIDE) 12.5 MG capsule Take 1 capsule by mouth once daily   ipratropium-albuterol (DUONEB) 0.5-2.5 (3) MG/3ML SOLN Inhale 3 mLs into the lungs daily as needed (wheezing).    lovastatin (MEVACOR) 20 MG tablet TAKE 1 TABLET BY MOUTH AT BEDTIME   metFORMIN (GLUCOPHAGE) 1000 MG tablet TAKE 1 TABLET BY MOUTH TWICE DAILY WITH  A MEAL   neomycin-polymyxin b-dexamethasone (MAXITROL) 3.5-10000-0.1 SUSP Place 1 drop into the right eye 4 (four) times daily.   nitroGLYCERIN (NITROSTAT) 0.4 MG SL tablet Place 1 tablet (0.4 mg total) under the tongue every 5 (five) minutes as needed for chest pain.   Omega-3 Fatty Acids (FISH OIL) 1000 MG CAPS Take 1,000 mg by mouth every morning.    OZEMPIC, 1 MG/DOSE, 4 MG/3ML SOPN INJECT 0.75 MLS (1 MG TOTAL) INTO THE SKIN ONCE A WEEK (USE ONLY ONCE LOWER DOSES ARE FINISHED)   pantoprazole (PROTONIX) 40 MG tablet Take 1 tablet (40 mg total) by mouth daily.   TRESIBA FLEXTOUCH 100 UNIT/ML FlexTouch Pen INJECT 12 UNITS SUBCUTANEOUSLY ONCE DAILY   valsartan (DIOVAN) 160 MG tablet Take 1 tablet by mouth once daily   verapamil (CALAN-SR) 240 MG CR tablet TAKE 1 TABLET BY MOUTH ONCE DAILY IN THE MORNING   Current Facility-Administered Medications for the 08/08/21 encounter (Office Visit) with Samantha [name], Samantha Cheng, MD  Medication   nitroGLYCERIN (NITROSTAT) SL tablet 0.4 mg     Allergies:   Iohexol, Iodinated diagnostic agents, and Jardiance [empagliflozin]   Social History   Socioeconomic History   Marital status: Married    Spouse name: Not on file   Number of children: Not on file   Years of education: Not on file   Highest education level: Not on file  Occupational History   Not on file  Tobacco Use   Smoking status: Never   Smokeless tobacco: Never  Vaping Use   Vaping Use: Never used  Substance and Sexual Activity   Alcohol use: No   Drug use: No   Sexual activity: Not Currently  Other Topics Concern   Not on file  Social History Narrative   Not on file   Social Determinants of Health   Financial Resource Strain: Not on file  Food Insecurity: Not on file  Transportation Needs: Not on file  Physical Activity: Not on file  Stress: Not on file  Social Connections: Not on file     Family History: The patient's family history includes CAD in her brother, father, and  mother; Diabetes in her brother, father, mother, and sister; Hyperlipidemia in her brother; Hypertension in her brother, father, mother, and sister.  ROS:   Please see the history of present illness.     All other systems reviewed and are negative.  EKGs/Labs/Other Studies Reviewed:    The following studies were reviewed today:   EKG:    Recent Labs: 05/18/2021: TSH 1.730 08/01/2021: ALT 34; BUN 14; Creatinine, Ser 1.05; Hemoglobin 11.8; Platelets 319; Potassium 4.0; Sodium 135  Recent Lipid Panel    Component Value Date/Time   CHOL 122 05/18/2021 0817   TRIG 140 05/18/2021 0817   HDL 52 05/18/2021 0817   CHOLHDL 2.3 05/18/2021 0817   CHOLHDL 2.5 Ratio 06/02/2010 2104   VLDL 36 06/02/2010 2104   LDLCALC 46 05/18/2021 0817    Physical Exam:    Physical Exam: Blood pressure 138/78, pulse 83, height '5\' 10"'$  (1.778 m), weight 219 lb 9.6 oz (99.6 kg), SpO2 96 %.  GEN:  Well nourished, well developed in no acute distress HEENT: Normal NECK: No JVD; No carotid bruits LYMPHATICS: No lymphadenopathy CARDIAC: RRR , no murmurs, rubs, gallops RESPIRATORY:  Clear to auscultation without rales, wheezing or rhonchi  ABDOMEN: Soft, non-tender, non-distended MUSCULOSKELETAL:  No edema; No deformity , R radial cath site looks good.  Great radial pulses SKIN: Warm and dry NEUROLOGIC:  Alert and oriented x 3    ASSESSMENT:    1. LBBB (left bundle branch block)   2. Hypertension associated with diabetes ([place])      PLAN:      CP:  she turned out to have normal coronaries. No cardiac explanation for her LBBB, Left shoulder numbness    2.  Left bundle branch block: - stable She will need an echo every several years - sooner if she develops any symptoms of CHF   3.  HTN:  :      Medication Adjustments/Labs and Tests Ordered: Current medicines are reviewed at length with the patient today.  Concerns regarding medicines are outlined above.  No orders of the defined types  were placed in this encounter.   No orders of the defined types were placed in this encounter.     Patient Instructions  Medication Instructions:  Your physician recommends that you continue on your current medications as directed. Please refer to the Current Medication list given to you today.  *If you need a refill on your cardiac medications before your next appointment, please call your pharmacy*   Lab Work: None Ordered If you have labs (blood work) drawn today and your tests are completely normal, you will receive your results only by: [place] (if you have MyChart) OR A paper copy in the mail If you have any lab test that is abnormal or we need to change your treatment, we will call you to review the results.   Testing/Procedures: None Ordered   Follow-Up: At [facility], you and your health needs are our priority.  As part of our continuing mission to provide you with exceptional heart care, we have created designated Provider Care Teams.  These Care Teams include your primary Cardiologist (physician) and Advanced Practice Providers (APPs -  Physician Assistants and Nurse Practitioners) who all work together to provide you with the care you need, when you need it.   Your next appointment:   1 year(s)  The format for your next appointment:   In Person  Provider:   You may see Mertie Moores, MD or one of the following Advanced Practice Providers on your designated Care Team:   Richardson Dopp, PA-C Robbie Lis, [place]    Signed, Mertie Moores, MD  08/08/2021 12:05 PM    [place]

## 2021-08-08 ENCOUNTER — Other Ambulatory Visit: Payer: Self-pay | Admitting: Physician Assistant

## 2021-08-08 ENCOUNTER — Ambulatory Visit: Payer: Medicare HMO | Admitting: Cardiovascular Disease

## 2021-08-08 ENCOUNTER — Encounter: Payer: Self-pay | Admitting: Cardiovascular Disease

## 2021-08-08 ENCOUNTER — Other Ambulatory Visit: Payer: Self-pay

## 2021-08-08 VITALS — BP 138/78 | HR 83 | Ht 70.0 in | Wt 219.6 lb

## 2021-08-08 DIAGNOSIS — I152 Hypertension secondary to endocrine disorders: Secondary | ICD-10-CM

## 2021-08-08 DIAGNOSIS — E1159 Type 2 diabetes mellitus with other circulatory complications: Secondary | ICD-10-CM

## 2021-08-08 DIAGNOSIS — I447 Left bundle-branch block, unspecified: Secondary | ICD-10-CM

## 2021-08-08 NOTE — Patient Instructions (Signed)

## 2021-08-09 DIAGNOSIS — M6281 Muscle weakness (generalized): Secondary | ICD-10-CM | POA: Diagnosis not present

## 2021-08-09 DIAGNOSIS — M5012 Mid-cervical disc disorder, unspecified level: Secondary | ICD-10-CM | POA: Diagnosis not present

## 2021-08-11 DIAGNOSIS — M6281 Muscle weakness (generalized): Secondary | ICD-10-CM | POA: Diagnosis not present

## 2021-08-11 DIAGNOSIS — M5012 Mid-cervical disc disorder, unspecified level: Secondary | ICD-10-CM | POA: Diagnosis not present

## 2021-08-16 ENCOUNTER — Telehealth: Payer: Self-pay | Admitting: Physician Assistant

## 2021-08-16 DIAGNOSIS — M792 Neuralgia and neuritis, unspecified: Secondary | ICD-10-CM | POA: Diagnosis not present

## 2021-08-16 DIAGNOSIS — M6281 Muscle weakness (generalized): Secondary | ICD-10-CM | POA: Diagnosis not present

## 2021-08-16 DIAGNOSIS — M5412 Radiculopathy, cervical region: Secondary | ICD-10-CM | POA: Diagnosis not present

## 2021-08-16 DIAGNOSIS — M5012 Mid-cervical disc disorder, unspecified level: Secondary | ICD-10-CM | POA: Diagnosis not present

## 2021-08-16 NOTE — Telephone Encounter (Signed)
Left message for patient to call back and schedule Medicare Annual Wellness Visit (AWV).  No Hx of AWV eligible for AWVI as of 08/27/2020  Please schedule at any time with [facility]Nurse.      Any questions, please call me at [phone]

## 2021-08-18 DIAGNOSIS — M6281 Muscle weakness (generalized): Secondary | ICD-10-CM | POA: Diagnosis not present

## 2021-08-18 DIAGNOSIS — M5012 Mid-cervical disc disorder, unspecified level: Secondary | ICD-10-CM | POA: Diagnosis not present

## 2021-08-22 ENCOUNTER — Other Ambulatory Visit: Payer: Self-pay

## 2021-08-22 ENCOUNTER — Ambulatory Visit
Admission: RE | Admit: 2021-08-22 | Discharge: 2021-08-22 | Disposition: A | Discharge status: Home / Self Care | Payer: Medicare HMO | Source: Ambulatory Visit | Attending: Otolaryngology | Admitting: Otolaryngology

## 2021-08-22 DIAGNOSIS — R590 Localized enlarged lymph nodes: Secondary | ICD-10-CM | POA: Diagnosis not present

## 2021-08-22 DIAGNOSIS — R221 Localized swelling, mass and lump, neck: Secondary | ICD-10-CM | POA: Diagnosis not present

## 2021-08-23 ENCOUNTER — Other Ambulatory Visit: Payer: Self-pay | Admitting: Physician Assistant

## 2021-08-23 DIAGNOSIS — M6281 Muscle weakness (generalized): Secondary | ICD-10-CM | POA: Diagnosis not present

## 2021-08-23 DIAGNOSIS — M5012 Mid-cervical disc disorder, unspecified level: Secondary | ICD-10-CM | POA: Diagnosis not present

## 2021-08-23 DIAGNOSIS — E1169 Type 2 diabetes mellitus with other specified complication: Secondary | ICD-10-CM

## 2021-08-24 ENCOUNTER — Other Ambulatory Visit: Payer: Self-pay | Admitting: Physician Assistant

## 2021-08-25 DIAGNOSIS — M5012 Mid-cervical disc disorder, unspecified level: Secondary | ICD-10-CM | POA: Diagnosis not present

## 2021-08-25 DIAGNOSIS — M6281 Muscle weakness (generalized): Secondary | ICD-10-CM | POA: Diagnosis not present

## 2021-09-05 DIAGNOSIS — M5412 Radiculopathy, cervical region: Secondary | ICD-10-CM | POA: Diagnosis not present

## 2021-09-06 ENCOUNTER — Other Ambulatory Visit: Payer: Self-pay | Admitting: Physician Assistant

## 2021-09-08 DIAGNOSIS — K1123 Chronic sialoadenitis: Secondary | ICD-10-CM | POA: Diagnosis not present

## 2021-09-08 DIAGNOSIS — J039 Acute tonsillitis, unspecified: Secondary | ICD-10-CM | POA: Diagnosis not present

## 2021-09-08 DIAGNOSIS — H7201 Central perforation of tympanic membrane, right ear: Secondary | ICD-10-CM | POA: Diagnosis not present

## 2021-09-12 ENCOUNTER — Other Ambulatory Visit: Payer: Self-pay

## 2021-09-12 DIAGNOSIS — Z0000 Encounter for general adult medical examination without abnormal findings: Secondary | ICD-10-CM

## 2021-09-12 DIAGNOSIS — E538 Deficiency of other specified B group vitamins: Secondary | ICD-10-CM

## 2021-09-12 DIAGNOSIS — E1169 Type 2 diabetes mellitus with other specified complication: Secondary | ICD-10-CM

## 2021-09-12 DIAGNOSIS — I152 Hypertension secondary to endocrine disorders: Secondary | ICD-10-CM

## 2021-09-12 DIAGNOSIS — E785 Hyperlipidemia, unspecified: Secondary | ICD-10-CM

## 2021-09-12 DIAGNOSIS — E559 Vitamin D deficiency, unspecified: Secondary | ICD-10-CM

## 2021-09-12 DIAGNOSIS — D72829 Elevated white blood cell count, unspecified: Secondary | ICD-10-CM

## 2021-09-12 DIAGNOSIS — E1159 Type 2 diabetes mellitus with other circulatory complications: Secondary | ICD-10-CM

## 2021-09-13 ENCOUNTER — Other Ambulatory Visit: Payer: Medicare HMO

## 2021-09-13 ENCOUNTER — Other Ambulatory Visit: Payer: Self-pay

## 2021-09-13 DIAGNOSIS — I152 Hypertension secondary to endocrine disorders: Secondary | ICD-10-CM | POA: Diagnosis not present

## 2021-09-13 DIAGNOSIS — E785 Hyperlipidemia, unspecified: Secondary | ICD-10-CM

## 2021-09-13 DIAGNOSIS — E538 Deficiency of other specified B group vitamins: Secondary | ICD-10-CM

## 2021-09-13 DIAGNOSIS — E559 Vitamin D deficiency, unspecified: Secondary | ICD-10-CM

## 2021-09-13 DIAGNOSIS — D72829 Elevated white blood cell count, unspecified: Secondary | ICD-10-CM

## 2021-09-13 DIAGNOSIS — E1169 Type 2 diabetes mellitus with other specified complication: Secondary | ICD-10-CM | POA: Diagnosis not present

## 2021-09-13 DIAGNOSIS — Z0000 Encounter for general adult medical examination without abnormal findings: Secondary | ICD-10-CM

## 2021-09-13 DIAGNOSIS — E1159 Type 2 diabetes mellitus with other circulatory complications: Secondary | ICD-10-CM

## 2021-09-14 LAB — COMPREHENSIVE METABOLIC PANEL
ALT: 36 IU/L — ABNORMAL HIGH (ref 0–32)
AST: 24 IU/L (ref 0–40)
Albumin/Globulin Ratio: 1.5 (ref 1.2–2.2)
Albumin: 3.9 g/dL (ref 3.8–4.8)
Alkaline Phosphatase: 73 IU/L (ref 44–121)
BUN/Creatinine Ratio: 22 (ref 12–28)
BUN: 25 mg/dL (ref 8–27)
Bilirubin Total: 0.3 mg/dL (ref 0.0–1.2)
CO2: 25 mmol/L (ref 20–29)
Calcium: 9.5 mg/dL (ref 8.7–10.3)
Chloride: 101 mmol/L (ref 96–106)
Creatinine, Ser: 1.13 mg/dL — ABNORMAL HIGH (ref 0.57–1.00)
Globulin, Total: 2.6 g/dL (ref 1.5–4.5)
Glucose: 105 mg/dL — ABNORMAL HIGH (ref 70–99)
Potassium: 4.5 mmol/L (ref 3.5–5.2)
Sodium: 139 mmol/L (ref 134–144)
Total Protein: 6.5 g/dL (ref 6.0–8.5)
eGFR: 53 mL/min/{1.73_m2} — ABNORMAL LOW (ref >59)

## 2021-09-14 LAB — CBC WITH DIFFERENTIAL/PLATELET
Basophils Absolute: 0.1 10*3/uL (ref 0.0–0.2)
Basos: 1 %
EOS (ABSOLUTE): 0.5 10*3/uL — ABNORMAL HIGH (ref 0.0–0.4)
Eos: 5 %
Hematocrit: 35 % (ref 34.0–46.6)
Hemoglobin: 11.8 g/dL (ref 11.1–15.9)
Immature Grans (Abs): 0 10*3/uL (ref 0.0–0.1)
Immature Granulocytes: 0 %
Lymphocytes Absolute: 3.1 10*3/uL (ref 0.7–3.1)
Lymphs: 34 %
MCH: 28.6 pg (ref 26.6–33.0)
MCHC: 33.7 g/dL (ref 31.5–35.7)
MCV: 85 fL (ref 79–97)
Monocytes Absolute: 0.6 10*3/uL (ref 0.1–0.9)
Monocytes: 7 %
Neutrophils Absolute: 4.9 10*3/uL (ref 1.4–7.0)
Neutrophils: 53 %
Platelets: 339 10*3/uL (ref 150–450)
RBC: 4.12 x10E6/uL (ref 3.77–5.28)
RDW: 11.9 % (ref 11.7–15.4)
WBC: 9.1 10*3/uL (ref 3.4–10.8)

## 2021-09-14 LAB — LIPID PANEL
Chol/HDL Ratio: 2.6 ratio (ref 0.0–4.4)
Cholesterol, Total: 144 mg/dL (ref 100–199)
HDL: 55 mg/dL (ref >39)
LDL Chol Calc (NIH): 62 mg/dL (ref 0–99)
Triglycerides: 157 mg/dL — ABNORMAL HIGH (ref 0–149)
VLDL Cholesterol Cal: 27 mg/dL (ref 5–40)

## 2021-09-14 LAB — TSH: TSH: 4.03 u[IU]/mL (ref 0.450–4.500)

## 2021-09-14 LAB — HEMOGLOBIN A1C
Est. average glucose Bld gHb Est-mCnc: 157 mg/dL
Hgb A1c MFr Bld: 7.1 % — ABNORMAL HIGH (ref 4.8–5.6)

## 2021-09-14 LAB — VITAMIN D 25 HYDROXY (VIT D DEFICIENCY, FRACTURES): Vit D, 25-Hydroxy: 36.2 ng/mL (ref 30.0–100.0)

## 2021-09-14 LAB — VITAMIN B12: Vitamin B-12: >2000 pg/mL — ABNORMAL HIGH (ref 232–1245)

## 2021-09-15 ENCOUNTER — Other Ambulatory Visit: Payer: Medicare HMO

## 2021-09-22 ENCOUNTER — Encounter: Payer: Self-pay | Admitting: Physician Assistant

## 2021-09-22 ENCOUNTER — Other Ambulatory Visit: Payer: Self-pay

## 2021-09-22 ENCOUNTER — Ambulatory Visit (INDEPENDENT_AMBULATORY_CARE_PROVIDER_SITE_OTHER): Payer: Medicare HMO | Admitting: Physician Assistant

## 2021-09-22 VITALS — BP 116/65 | HR 76 | Temp 97.6°F | Ht 70.0 in | Wt 224.0 lb

## 2021-09-22 DIAGNOSIS — Z23 Encounter for immunization: Secondary | ICD-10-CM

## 2021-09-22 DIAGNOSIS — E559 Vitamin D deficiency, unspecified: Secondary | ICD-10-CM | POA: Diagnosis not present

## 2021-09-22 DIAGNOSIS — E538 Deficiency of other specified B group vitamins: Secondary | ICD-10-CM | POA: Diagnosis not present

## 2021-09-22 DIAGNOSIS — E1169 Type 2 diabetes mellitus with other specified complication: Secondary | ICD-10-CM | POA: Diagnosis not present

## 2021-09-22 DIAGNOSIS — E785 Hyperlipidemia, unspecified: Secondary | ICD-10-CM

## 2021-09-22 DIAGNOSIS — I152 Hypertension secondary to endocrine disorders: Secondary | ICD-10-CM | POA: Diagnosis not present

## 2021-09-22 DIAGNOSIS — E1159 Type 2 diabetes mellitus with other circulatory complications: Secondary | ICD-10-CM

## 2021-09-22 DIAGNOSIS — F39 Unspecified mood [affective] disorder: Secondary | ICD-10-CM

## 2021-09-22 MED ORDER — TRESIBA FLEXTOUCH 100 UNIT/ML ~~LOC~~ SOPN: 14.0000 [IU] | PEN_INJECTOR | Freq: Every day | SUBCUTANEOUS | 0 refills | Status: DC

## 2021-09-22 NOTE — Assessment & Plan Note (Addendum)
-  Controlled. -Continue current medication regimen. -Discussed recent CMP, renal function stable, electrolytes normal. -Will continue to monitor.

## 2021-09-22 NOTE — Progress Notes (Signed)
Established Patient Office Visit  Subjective:  Patient ID: Samantha [name], female    DOB: [birth year]  Age: 65 y.o. MRN: [id number]  CC:  Chief Complaint  Patient presents with   Follow-up   Diabetes   Hyperlipidemia   Hypertension    HPI Samantha [name] presents for follow up on diabetes mellitus, hypertension and hyperlipidemia. Patient has no acute concerns.  Diabetes: Pt denies increased urination or thirst. Pt reports medication compliance. No hypoglycemic events. Checking glucose at home. States her FBS sugars were elevated in the 200-300s and most recently have been more at her baseline 110-130s. Patient was on steroid therapy in August.  HTN: Pt denies chest pain, palpitations, dizziness or lower extremity swelling. Taking medication as directed without side effects. Pt continues to monitor sodium intake.  HLD: Pt taking medication as directed without issues. Denies side effects including myalgias or muscle weakness. Tries to follow a balanced diet.  Mood: Patient reports medication compliance. Denies mood changes, SI/HI.  Past Medical History:  Diagnosis Date   Arthritis    Dyslipidemia    Heart disease    Heart murmur 2011   Left bundle branch block 2011   Type II or unspecified type diabetes mellitus without mention of complication, not stated as uncontrolled    Unspecified essential hypertension     Past Surgical History:  Procedure Laterality Date   LEFT HEART CATH AND CORONARY ANGIOGRAPHY N/A 06/30/2021   Procedure: LEFT HEART CATH AND CORONARY ANGIOGRAPHY;  Surgeon: Lennette Bihari, MD;  Location: [facility];  Service: Cardiovascular;  Laterality: N/A;   REPLACEMENT TOTAL KNEE Left 2017   TONSILLECTOMY  1962    Family History  Problem Relation Age of Onset   Diabetes Mother    Hypertension Mother    CAD Mother    Hypertension Father    Diabetes Father    CAD Father    Diabetes Brother    Hyperlipidemia Brother    Hypertension Brother     CAD Brother    Diabetes Sister    Hypertension Sister     Social History   Socioeconomic History   Marital status: Married    Spouse name: Not on file   Number of children: Not on file   Years of education: Not on file   Highest education level: Not on file  Occupational History   Not on file  Tobacco Use   Smoking status: Never   Smokeless tobacco: Never  Vaping Use   Vaping Use: Never used  Substance and Sexual Activity   Alcohol use: No   Drug use: No   Sexual activity: Not Currently  Other Topics Concern   Not on file  Social History Narrative   Not on file   Social Determinants of Health   Financial Resource Strain: Not on file  Food Insecurity: Not on file  Transportation Needs: Not on file  Physical Activity: Not on file  Stress: Not on file  Social Connections: Not on file  Intimate Partner Violence: Not on file    Outpatient Medications Prior to Visit  Medication Sig Dispense Refill   aspirin 81 MG tablet Take 81 mg by mouth every morning.      azelastine (ASTELIN) 0.1 % nasal spray Place 1 spray into both nostrils daily as needed for allergies.      budesonide-formoterol (SYMBICORT) 80-4.5 MCG/ACT inhaler Inhale 2 puffs into the lungs 2 (two) times daily. 1 Inhaler 12   Calcium Citrate-Vitamin D (CALCIUM + D  PO) Take 1 tablet by mouth every morning.      cetirizine (ZYRTEC) 10 MG tablet Take 10 mg by mouth daily.     diclofenac (VOLTAREN) 75 MG EC tablet Take 1 tablet by mouth twice daily 180 tablet 0   diclofenac Sodium (VOLTAREN) 1 % GEL Apply 2 g topically 4 (four) times daily. 100 g 3   FLUoxetine (PROZAC) 20 MG capsule Take 1 capsule by mouth once daily in the morning 90 capsule 0   fluticasone (FLONASE) 50 MCG/ACT nasal spray Place 2 sprays into both nostrils daily. 16 g 0   gabapentin (NEURONTIN) 300 MG capsule Take 1 capsule by mouth twice daily (Patient taking differently: Take 300 mg by mouth 3 (three) times daily. Take 1 tablet by mouth in  the morning and take up to two tablets in the evening.) 180 capsule 0   hydrochlorothiazide (MICROZIDE) 12.5 MG capsule Take 1 capsule by mouth once daily 90 capsule 0   ipratropium-albuterol (DUONEB) 0.5-2.5 (3) MG/3ML SOLN Inhale 3 mLs into the lungs daily as needed (wheezing).      lovastatin (MEVACOR) 20 MG tablet TAKE 1 TABLET BY MOUTH AT BEDTIME 90 tablet 0   metFORMIN (GLUCOPHAGE) 1000 MG tablet TAKE 1 TABLET BY MOUTH TWICE DAILY WITH A MEAL 180 tablet 0   nitroGLYCERIN (NITROSTAT) 0.4 MG SL tablet Place 1 tablet (0.4 mg total) under the tongue every 5 (five) minutes as needed for chest pain. 30 tablet 12   Omega-3 Fatty Acids (FISH OIL) 1000 MG CAPS Take 1,000 mg by mouth every morning.      OZEMPIC, 1 MG/DOSE, 4 MG/3ML SOPN INJECT 0.75 MLS (1 MG TOTAL) INTO THE SKIN ONCE A WEEK (USE ONLY ONCE LOWER DOSES ARE FINISHED) 30 mL 0   pantoprazole (PROTONIX) 40 MG tablet Take 1 tablet (40 mg total) by mouth daily. 90 tablet 0   valsartan (DIOVAN) 160 MG tablet Take 1 tablet by mouth once daily 90 tablet 0   verapamil (CALAN-SR) 240 MG CR tablet TAKE 1 TABLET BY MOUTH ONCE DAILY IN THE MORNING 90 tablet 0   TRESIBA FLEXTOUCH 100 UNIT/ML FlexTouch Pen INJECT 12 UNITS SUBCUTANEOUSLY ONCE DAILY 15 mL 0   diphenhydrAMINE (BENADRYL) 50 MG tablet Take 1 tablet (50 mg total) by mouth once for 1 dose. Take on Thursday Aug 4 2 hours prior to your procedure 1 tablet 0   methocarbamol (ROBAXIN) 500 MG tablet Take 1 tablet (500 mg total) by mouth 2 (two) times daily. (Patient not taking: Reported on 08/08/2021) 20 tablet 0   neomycin-polymyxin b-dexamethasone (MAXITROL) 3.5-10000-0.1 SUSP Place 1 drop into the right eye 4 (four) times daily.     predniSONE (STERAPRED UNI-PAK 21 TAB) 10 MG (21) TBPK tablet Take by mouth daily. Take 6 tabs by mouth daily  for 2 days, then 5 tabs for 2 days, then 4 tabs for 2 days, then 3 tabs for 2 days, 2 tabs for 2 days, then 1 tab by mouth daily for 2 days (Patient not taking:  Reported on 08/08/2021) 42 tablet 0   Facility-Administered Medications Prior to Visit  Medication Dose Route Frequency Provider Last Rate Last Admin   nitroGLYCERIN (NITROSTAT) SL tablet 0.4 mg  0.4 mg Sublingual Q5 min PRN Supriya Beaston, PA-C        Allergies  Allergen Reactions   Iohexol Anaphylaxis     Code: SOB, Desc: respiratory distress-ASM, Onset Date: [id number]    Iodinated Diagnostic Agents Other (See Comments)   Jardiance [Empagliflozin]  Rash    ROS Review of Systems Review of Systems:  A fourteen system review of systems was performed and found to be positive as per HPI.   Objective:    Physical Exam General:  Well Developed, well nourished, appropriate for stated age.  Neuro:  Alert and oriented,  extra-ocular muscles intact  HEENT:  Normocephalic, atraumatic, neck supple Skin:  no gross rash, warm, pink. Cardiac:  RRR, S1 S2 Respiratory: CTA B/L, Not using accessory muscles, speaking in full sentences- unlabored. Vascular:  Ext warm, no cyanosis apprec.; cap RF less 2 sec. Psych:  No HI/SI, judgement and insight good, Euthymic mood. Full Affect.  BP 116/65   Pulse 76   Temp 97.6 F (36.4 C)   Ht $R'5\' 10"'Uf$  (1.778 m)   Wt 224 lb (101.6 kg)   SpO2 96%   BMI 32.14 kg/m  Wt Readings from Last 3 Encounters:  09/22/21 224 lb (101.6 kg)  08/08/21 219 lb 9.6 oz (99.6 kg)  08/01/21 218 lb (98.9 kg)     Health Maintenance Due  Topic Date Due   Hepatitis C Screening  Never done   Pneumonia Vaccine 59+ Years old (2 - PCV) 11/28/2011   MAMMOGRAM  02/15/2018   DEXA SCAN  Never done   COVID-19 Vaccine (4 - Booster for Moderna series) 12/11/2020   INFLUENZA VACCINE  06/27/2021    There are no preventive care reminders to display for this patient.  Lab Results  Component Value Date   TSH 4.030 09/13/2021   Lab Results  Component Value Date   WBC 9.1 09/13/2021   HGB 11.8 09/13/2021   HCT 35.0 09/13/2021   MCV 85 09/13/2021   PLT 339 09/13/2021   Lab  Results  Component Value Date   NA 139 09/13/2021   K 4.5 09/13/2021   CO2 25 09/13/2021   GLUCOSE 105 (H) 09/13/2021   BUN 25 09/13/2021   CREATININE 1.13 (H) 09/13/2021   BILITOT 0.3 09/13/2021   ALKPHOS 73 09/13/2021   AST 24 09/13/2021   ALT 36 (H) 09/13/2021   PROT 6.5 09/13/2021   ALBUMIN 3.9 09/13/2021   CALCIUM 9.5 09/13/2021   ANIONGAP 10 08/01/2021   EGFR 53 (L) 09/13/2021   Lab Results  Component Value Date   CHOL 144 09/13/2021   Lab Results  Component Value Date   HDL 55 09/13/2021   Lab Results  Component Value Date   LDLCALC 62 09/13/2021   Lab Results  Component Value Date   TRIG 157 (H) 09/13/2021   Lab Results  Component Value Date   CHOLHDL 2.6 09/13/2021   Lab Results  Component Value Date   HGBA1C 7.1 (H) 09/13/2021   Depression screen PHQ 2/9 09/22/2021 05/23/2021 01/14/2021 09/13/2020 05/20/2020  Decreased Interest 0 0 0 0 0  Down, Depressed, Hopeless 0 0 0 0 0  PHQ - 2 Score 0 0 0 0 0  Altered sleeping 0 0 0 1 0  Tired, decreased energy 0 $Remove'1 1 1 'fCKSfUH$ 0  Change in appetite 0 0 0 1 0  Feeling bad or failure about yourself  0 0 0 0 0  Trouble concentrating 0 0 0 0 0  Moving slowly or fidgety/restless 0 0 0 0 0  Suicidal thoughts 0 0 0 0 0  PHQ-9 Score 0 $Remov'1 1 3 'iYRHVq$ 0  Difficult doing work/chores Not difficult at all - - Somewhat difficult -   GAD 7 : Generalized Anxiety Score 09/22/2021 05/23/2021 02/23/2020  Nervous, Anxious, on  Edge 0 0 0  Control/stop worrying 0 0 0  Worry too much - different things 0 0 1  Trouble relaxing 0 0 0  Restless 0 0 1  Easily annoyed or irritable 0 0 1  Afraid - awful might happen 0 0 0  Total GAD 7 Score 0 0 3  Anxiety Difficulty Not difficult at all - Somewhat difficult       Assessment & Plan:   Problem List Items Addressed This Visit       Cardiovascular and Mediastinum   Hypertension associated with diabetes ([place])    -Controlled. -Continue current medication regimen. -Discussed recent CMP, renal  function stable, electrolytes normal. -Will continue to monitor.      Relevant Medications   insulin degludec (TRESIBA FLEXTOUCH) 100 UNIT/ML FlexTouch Pen     Endocrine   Diabetes ([place]) - Primary    -A1c mildly increased from 6.6 to 7.1 likely secondary to extended use of corticosteroid therapy. Patient reports ambulatory glucose readings returning to baseline, will continue current medication regimen. Will repeat A1c at follow up visit.  -Discussed low carbohydrate and glucose intake. -Will continue to monitor.      Relevant Medications   insulin degludec (TRESIBA FLEXTOUCH) 100 UNIT/ML FlexTouch Pen   Hyperlipidemia associated with type 2 diabetes mellitus (H[facility])   Relevant Medications   insulin degludec (TRESIBA FLEXTOUCH) 100 UNIT/ML FlexTouch Pen     Other   B12 deficiency   Vitamin D deficiency   Mood disorder ([facility])   Other Visit Diagnoses     Need for influenza vaccination       Relevant Orders   Flu Vaccine QUAD High Dose(Fluad)       Hyperlipidemia associated with type 2 diabetes mellitus: -Discussed with patient recent lipid panel, LDL 62, triglycerides mildly elevated at 157. Will continue current medication regimen. Recent CMP, ALT 36 (mildly elevated). Follow low fat diet. -Will repeat lipid panel and hepatic function with [facility].  Mood disorder: -Stable. -Continue current medication regimen. -Will continue current medication regimen.  B12 deficiency: -Discussed recent B12 which is elevated, advised to start taking 0.5 tablet of 1000 mcg of cyanocobalamin.  -Will continue to monitor and repeat with [facility].  Vitamin D deficiency: -Discussed recent Vit D which has improved from 27.1 to 36.2, recommend to continue with Vitamin D3 supplement.  Meds ordered this encounter  Medications   insulin degludec (TRESIBA FLEXTOUCH) 100 UNIT/ML FlexTouch Pen    Sig: Inject 14 Units into the skin at bedtime.    Dispense:  15 mL    Refill:  0    Order Specific Question:    Supervising Provider    Answer:   Beatrice Lecher D [[id number]]    Follow-up: Return in about 4 months (around 01/23/2022) for [place] and [place] .    Lorrene Reid, PA-C

## 2021-09-22 NOTE — Patient Instructions (Signed)
Vitamin B12 Deficiency Vitamin B12 deficiency means that your body does not have enough vitamin B12. The body needs this vitamin: To make red blood cells. To make genes (DNA). To help the nerves work. If you do not have enough vitamin B12 in your body, you can have health problems. What are the causes? Not eating enough foods that contain vitamin B12. Not being able to absorb vitamin B12 from the food that you eat. Certain digestive system diseases. A condition in which the body does not make enough of a certain protein, which results in too few red blood cells (pernicious anemia). Having a surgery in which part of the stomach or small intestine is removed. Taking medicines that make it hard for the body to absorb vitamin B12. These medicines include: Heartburn medicines. Some antibiotic medicines. Other medicines that are used to treat certain conditions. What increases the risk? Being older than age 50. Eating a vegetarian or vegan diet, especially while you are pregnant. Eating a poor diet while you are pregnant. Taking certain medicines. Having alcoholism. What are the signs or symptoms? In some cases, there are no symptoms. If the condition leads to too few blood cells or nerve damage, symptoms can occur, such as: Feeling weak. Feeling tired (fatigued). Not being hungry. Weight loss. A loss of feeling (numbness) or tingling in your hands and feet. Redness and burning of the tongue. Being mixed up (confused) or having memory problems. Sadness (depression). Problems with your senses. This can include color blindness, ringing in the ears, or loss of taste. Watery poop (diarrhea) or trouble pooping (constipation). Trouble walking. If anemia is very bad, symptoms can include: Being short of breath. Being dizzy. Having a very fast heartbeat. How is this treated? Changing the way you eat and drink, such as: Eating more foods that contain vitamin B12. Drinking little or no  alcohol. Getting vitamin B12 shots. Taking vitamin B12 supplements. Your doctor will tell you the dose that is best for you. Follow these instructions at home: Eating and drinking  Eat lots of healthy foods that contain vitamin B12. These include: Meats and poultry, such as beef, pork, chicken, turkey, and organ meats, such as liver. Seafood, such as clams, rainbow trout, salmon, tuna, and haddock. Eggs. Cereal and dairy products that have vitamin B12 added to them. Check the label. The items listed above may not be a complete list of what you can eat and drink. Contact a dietitian for more options. General instructions Get any shots as told by your doctor. Take supplements only as told by your doctor. Do not drink alcohol if your doctor tells you not to. In some cases, you may only be asked to limit alcohol use. Keep all follow-up visits as told by your doctor. This is important. Contact a doctor if: Your symptoms come back. Get help right away if: You have trouble breathing. You have a very fast heartbeat. You have chest pain. You get dizzy. You pass out. Summary Vitamin B12 deficiency means that your body is not getting enough vitamin B12. In some cases, there are no symptoms of this condition. Treatment may include making a change in the way you eat and drink, getting vitamin B12 shots, or taking supplements. Eat lots of healthy foods that contain vitamin B12. This information is not intended to replace advice given to you by your health care provider. Make sure you discuss any questions you have with your health care provider. Document Revised: 07/23/2018 Document Reviewed: 07/23/2018 Elsevier Patient Education    2022 [employer]  

## 2021-09-22 NOTE — Assessment & Plan Note (Signed)
>>  ASSESSMENT AND PLAN FOR DIABETES WRITTEN ON 09/22/2021  3:18 PM BY ABONZA, MARITZA, PA-C  -A1c mildly increased from 6.6 to 7.1 likely secondary to extended use of corticosteroid therapy. Patient reports ambulatory glucose readings returning to baseline, will continue current medication regimen. Will repeat A1c at follow up visit.  -Discussed low carbohydrate and glucose intake. -Will continue to monitor.

## 2021-09-22 NOTE — Assessment & Plan Note (Signed)
-  A1c mildly increased from 6.6 to 7.1 likely secondary to extended use of corticosteroid therapy. Patient reports ambulatory glucose readings returning to baseline, will continue current medication regimen. Will repeat A1c at follow up visit.  -Discussed low carbohydrate and glucose intake. -Will continue to monitor.

## 2021-09-26 DIAGNOSIS — Z23 Encounter for immunization: Secondary | ICD-10-CM

## 2021-09-26 DIAGNOSIS — E538 Deficiency of other specified B group vitamins: Secondary | ICD-10-CM | POA: Diagnosis not present

## 2021-09-26 DIAGNOSIS — E785 Hyperlipidemia, unspecified: Secondary | ICD-10-CM | POA: Diagnosis not present

## 2021-09-26 DIAGNOSIS — E559 Vitamin D deficiency, unspecified: Secondary | ICD-10-CM | POA: Diagnosis not present

## 2021-09-26 DIAGNOSIS — I152 Hypertension secondary to endocrine disorders: Secondary | ICD-10-CM | POA: Diagnosis not present

## 2021-09-26 DIAGNOSIS — F39 Unspecified mood [affective] disorder: Secondary | ICD-10-CM | POA: Diagnosis not present

## 2021-09-26 DIAGNOSIS — E1169 Type 2 diabetes mellitus with other specified complication: Secondary | ICD-10-CM | POA: Diagnosis not present

## 2021-09-26 DIAGNOSIS — E1159 Type 2 diabetes mellitus with other circulatory complications: Secondary | ICD-10-CM | POA: Diagnosis not present

## 2021-09-27 ENCOUNTER — Other Ambulatory Visit: Payer: Self-pay | Admitting: Physician Assistant

## 2021-10-18 ENCOUNTER — Other Ambulatory Visit: Payer: Self-pay | Admitting: Physician Assistant

## 2021-10-19 ENCOUNTER — Other Ambulatory Visit: Payer: Self-pay | Admitting: Physician Assistant

## 2021-10-19 MED ORDER — GABAPENTIN 300 MG PO CAPS: ORAL_CAPSULE | ORAL | 1 refills | Status: DC

## 2021-10-26 ENCOUNTER — Other Ambulatory Visit: Payer: Self-pay | Admitting: Physician Assistant

## 2021-11-08 ENCOUNTER — Other Ambulatory Visit: Payer: Self-pay | Admitting: Physician Assistant

## 2021-11-08 DIAGNOSIS — E1159 Type 2 diabetes mellitus with other circulatory complications: Secondary | ICD-10-CM

## 2021-11-08 DIAGNOSIS — I152 Hypertension secondary to endocrine disorders: Secondary | ICD-10-CM

## 2022-01-02 ENCOUNTER — Other Ambulatory Visit: Payer: Self-pay | Admitting: Physician Assistant

## 2022-01-12 ENCOUNTER — Encounter: Payer: Self-pay | Admitting: Physician Assistant

## 2022-01-12 ENCOUNTER — Ambulatory Visit (INDEPENDENT_AMBULATORY_CARE_PROVIDER_SITE_OTHER): Payer: Medicare HMO | Admitting: Physician Assistant

## 2022-01-12 ENCOUNTER — Other Ambulatory Visit: Payer: Self-pay

## 2022-01-12 VITALS — Ht 70.0 in | Wt 216.0 lb

## 2022-01-12 DIAGNOSIS — J018 Other acute sinusitis: Secondary | ICD-10-CM

## 2022-01-12 MED ORDER — AMOXICILLIN-POT CLAVULANATE 875-125 MG PO TABS: 1.0000 | ORAL_TABLET | Freq: Two times a day (BID) | ORAL | 0 refills | Status: DC

## 2022-01-12 MED ORDER — METHYLPREDNISOLONE 4 MG PO TBPK: ORAL_TABLET | ORAL | 0 refills | Status: DC

## 2022-01-12 NOTE — Progress Notes (Signed)
Telehealth office visit note for Samantha Reid, PA-C- at Primary Care at [facility]   I connected with current patient today by telephone and verified that I am speaking with the correct person    Location of the patient: Home  Location of the provider: Office - This visit type was conducted due to national recommendations for restrictions regarding the COVID-19 Pandemic (e.g. social distancing) in an effort to limit this patient's exposure and mitigate transmission in our community.    - No physical exam could be performed with this format, beyond that communicated to [place] by the patient/ family members as noted.   - Additionally my office staff/ schedulers were to discuss with the patient that there may be a monetary charge related to this service, depending on their medical insurance.  My understanding is that patient understood and consented to proceed.     _________________________________________________________________________________   History of Present Illness: Patient calls in with c/o sinus pressures, nasal congestion, headache, and bilateral earache. Patient reports symptoms started about 7 days ago. Her eyes and teeth now hurt as well. Feels worse today. Has been alternating Tylenol and Ibuprofen for pain relief. No cough, fever, shortness of breath, chest pain, runny nose, sore throat, n/v/d. Did an at-home Covid test today which resulted negative.      GAD 7 : Generalized Anxiety Score 09/22/2021 05/23/2021 02/23/2020  Nervous, Anxious, on Edge 0 0 0  Control/stop worrying 0 0 0  Worry too much - different things 0 0 1  Trouble relaxing 0 0 0  Restless 0 0 1  Easily annoyed or irritable 0 0 1  Afraid - awful might happen 0 0 0  Total GAD 7 Score 0 0 3  Anxiety Difficulty Not difficult at all - Somewhat difficult    Depression screen [facility] 2/9 09/22/2021 05/23/2021 01/14/2021 09/13/2020 05/20/2020  Decreased Interest 0 0 0 0 0  Down, Depressed, Hopeless 0 0 0 0 0   PHQ - 2 Score 0 0 0 0 0  Altered sleeping 0 0 0 1 0  Tired, decreased energy 0 $Remove'1 1 1 'ZQcnKta$ 0  Change in appetite 0 0 0 1 0  Feeling bad or failure about yourself  0 0 0 0 0  Trouble concentrating 0 0 0 0 0  Moving slowly or fidgety/restless 0 0 0 0 0  Suicidal thoughts 0 0 0 0 0  PHQ-9 Score 0 $Remov'1 1 3 'PjfZrx$ 0  Difficult doing work/chores Not difficult at all - - Somewhat difficult -      Impression and Recommendations:     1. Acute non-recurrent sinusitis of other sinus     Patient has s/sx consistent with sinusitis and due to severity and worsening symptoms will start oral antibiotic therapy with Augmentin and corticosteroid. Recommend to take a decongestant such as Mucinex and use a neti pot or saline nasal spray. Follow up if symptoms fail to improve or worsen.    - As part of my medical decision making, I reviewed the following data within the [place] History obtained from pt /family, CMA notes reviewed and incorporated if applicable, Labs reviewed, Radiograph/ tests reviewed if applicable and OV notes from prior OV's with me, as well as any other specialists she/he has seen since seeing me last, were all reviewed and used in my medical decision making process today.    - Additionally, when appropriate, discussion had with patient regarding our treatment plan, and their biases/concerns about  that plan were used in my medical decision making today.    - The patient agreed with the plan and demonstrated an understanding of the instructions.   No barriers to understanding were identified.     - The patient was advised to call back or seek an in-person evaluation if the symptoms worsen or if the condition fails to improve as anticipated.   Return if symptoms worsen or fail to improve.    No orders of the defined types were placed in this encounter.   Meds ordered this encounter  Medications   amoxicillin-clavulanate (AUGMENTIN) 875-125 MG tablet    Sig: Take 1 tablet  by mouth 2 (two) times daily.    Dispense:  14 tablet    Refill:  0    Order Specific Question:   Supervising Provider    Answer:   Beatrice Lecher D [[id number]]   methylPREDNISolone (MEDROL DOSEPAK) 4 MG TBPK tablet    Sig: Take as directed on package.    Dispense:  21 tablet    Refill:  0    Order Specific Question:   Supervising Provider    Answer:   Beatrice Lecher D [[id number]]    There are no discontinued medications.     Time spent on telephone encounter was 5 minutes.   Note:  This note was prepared with assistance of Dragon voice recognition software. Occasional wrong-word or sound-a-like substitutions may have occurred due to the inherent limitations of voice recognition software.    The [place] was signed into law in 2016 which includes the topic of electronic health records.  This provides immediate access to information in MyChart.  This includes consultation notes, operative notes, office notes, lab results and pathology reports.  If you have any questions about what you read please let us know at your next visit or call us at the office.  We are right here with you.   __________________________________________________________________________________     Patient Care Team    Relationship Specialty Notifications Start End  Samantha [name], [place] PCP - General   03/28/20   Nahser, Wonda Cheng, MD PCP - Cardiology Cardiology  08/08/21   Nahser, Wonda Cheng, MD Consulting Physician Cardiology  02/23/20   Collene Gobble, MD Consulting Physician Pulmonary Disease  02/23/20   [facility]    02/23/20   Carol Ada, MD Consulting Physician Gastroenterology  02/23/20   [place], [place]  02/23/20   [facility]    02/23/20      -Vitals obtained; medications/ allergies reconciled;  personal medical, social, Sx etc.histories were updated by CMA, reviewed by me and are reflected in chart   Patient  Active Problem List   Diagnosis Date Noted   Unstable angina ([place])    Arthritis 01/14/2021   Bowel incontinence 01/14/2021   ETD (eustachian tube dysfunction) 01/14/2021   Left lower quadrant pain 01/14/2021   Personal history of colonic polyps 01/14/2021   Screening for malignant neoplasm of colon 01/14/2021   Hyperlipidemia associated with type 2 diabetes mellitus ([place]) 02/23/2020   Microalbuminuria due to type 2 diabetes mellitus ([place]) 02/23/2020   Type 2 diabetes mellitus with morbid obesity ([place]) 02/23/2020   B12 deficiency 02/23/2020   Vitamin D deficiency 02/23/2020   Family history of heart disease 02/23/2020   Family history of diabetes mellitus (DM) 02/23/2020   Family history of colon cancer-grandmother 02/23/2020   Family history of mixed hyperlipidemia 02/23/2020   Family history  of hypertension 02/23/2020   Mood disorder ([place]) 02/23/2020   Degenerative disc disease, lumbar 07/19/2018   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   Spondylosis of lumbar spine 07/19/2018   Lumbar radiculopathy 07/05/2018   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/21/2018   Eustachian tube dysfunction, bilateral 02/21/2018   Recurrent sinusitis 02/21/2018   Tympanic membrane retraction, left 02/21/2018   GERD (gastroesophageal reflux disease) 10/24/2017   Cough 08/14/2017   Dyspnea 08/14/2017   Pulmonary nodule, right 08/14/2017   Back pain 07/12/2017   Obesity (BMI 30-39.9) 06/11/2017   LBBB (left bundle branch block) 08/10/2016   Leukocytosis 08/10/2016   Hypertension associated with diabetes ([place]) 08/10/2016   Pain in the chest 08/09/2016   Diabetes ([place]) 11/20/2015     Current Meds  Medication Sig   amoxicillin-clavulanate (AUGMENTIN) 875-125 MG tablet Take 1 tablet by mouth 2 (two) times daily.   aspirin 81 MG tablet Take 81 mg by mouth every morning.    azelastine (ASTELIN) 0.1 % nasal spray Place 1 spray into both nostrils  daily as needed for allergies.    budesonide-formoterol (SYMBICORT) 80-4.5 MCG/ACT inhaler Inhale 2 puffs into the lungs 2 (two) times daily.   Calcium Citrate-Vitamin D (CALCIUM + D PO) Take 1 tablet by mouth every morning.    cetirizine (ZYRTEC) 10 MG tablet Take 10 mg by mouth daily.   diclofenac (VOLTAREN) 75 MG EC tablet Take 1 tablet by mouth twice daily   diclofenac Sodium (VOLTAREN) 1 % GEL Apply 2 g topically 4 (four) times daily.   FLUoxetine (PROZAC) 20 MG capsule Take 1 capsule by mouth once daily in the morning   fluticasone (FLONASE) 50 MCG/ACT nasal spray Place 2 sprays into both nostrils daily.   gabapentin (NEURONTIN) 300 MG capsule Take 1 capsule by mouth in the morning and 2 capsules in the evening.   hydrochlorothiazide (MICROZIDE) 12.5 MG capsule Take 1 capsule by mouth once daily   ipratropium-albuterol (DUONEB) 0.5-2.5 (3) MG/3ML SOLN Inhale 3 mLs into the lungs daily as needed (wheezing).    lovastatin (MEVACOR) 20 MG tablet TAKE 1 TABLET BY MOUTH AT BEDTIME   metFORMIN (GLUCOPHAGE) 1000 MG tablet TAKE 1 TABLET BY MOUTH TWICE DAILY WITH A MEAL   methylPREDNISolone (MEDROL DOSEPAK) 4 MG TBPK tablet Take as directed on package.   nitroGLYCERIN (NITROSTAT) 0.4 MG SL tablet Place 1 tablet (0.4 mg total) under the tongue every 5 (five) minutes as needed for chest pain.   Omega-3 Fatty Acids (FISH OIL) 1000 MG CAPS Take 1,000 mg by mouth every morning.    OZEMPIC, 1 MG/DOSE, 4 MG/3ML SOPN INJECT 0.75 MLS (1 MG TOTAL) INTO THE SKIN ONCE A WEEK (USE ONLY ONCE LOWER DOSES ARE FINISHED)   pantoprazole (PROTONIX) 40 MG tablet Take 1 tablet (40 mg total) by mouth daily.   TRESIBA FLEXTOUCH 100 UNIT/ML FlexTouch Pen INJECT 12 UNITS SUBCUTANEOUSLY ONCE DAILY   valsartan (DIOVAN) 160 MG tablet Take 1 tablet by mouth once daily   verapamil (CALAN-SR) 240 MG CR tablet TAKE 1 TABLET BY MOUTH ONCE DAILY IN THE MORNING   Current Facility-Administered Medications for the 01/12/22 encounter  (Office Visit) with Samantha Reid, PA-C  Medication   nitroGLYCERIN (NITROSTAT) SL tablet 0.4 mg     Allergies:  Allergies  Allergen Reactions   Iohexol Anaphylaxis     Code: SOB, Desc: respiratory distress-ASM, Onset Date: 60109323    Iodinated Contrast Media Other (See Comments)   Jardiance [Empagliflozin]  Rash     ROS:  See above HPI for pertinent positives and negatives   Objective:   Height $Remov'5\' 10"'FjFSLI$  (1.778 m), weight 216 lb (98 kg).  (if some vitals are omitted, this means that patient was UNABLE to obtain them.) General: A & O * 3; sounds in no acute distress; sounds congested  Respiratory: speaking in full sentences, no conversational dyspnea Psych: insight appears good, mood- appears appropriate

## 2022-01-19 ENCOUNTER — Other Ambulatory Visit: Payer: Self-pay | Admitting: Physician Assistant

## 2022-01-19 DIAGNOSIS — E1169 Type 2 diabetes mellitus with other specified complication: Secondary | ICD-10-CM

## 2022-01-26 ENCOUNTER — Ambulatory Visit: Payer: Medicare HMO | Admitting: Physician Assistant

## 2022-01-30 DIAGNOSIS — H524 Presbyopia: Secondary | ICD-10-CM | POA: Diagnosis not present

## 2022-01-30 DIAGNOSIS — E119 Type 2 diabetes mellitus without complications: Secondary | ICD-10-CM | POA: Diagnosis not present

## 2022-01-30 LAB — HM DIABETES EYE EXAM

## 2022-02-08 ENCOUNTER — Other Ambulatory Visit: Payer: Self-pay | Admitting: Physician Assistant

## 2022-02-13 ENCOUNTER — Encounter: Payer: Self-pay | Admitting: Nurse Practitioner

## 2022-02-13 ENCOUNTER — Ambulatory Visit (INDEPENDENT_AMBULATORY_CARE_PROVIDER_SITE_OTHER): Payer: Medicare HMO | Admitting: Nurse Practitioner

## 2022-02-13 ENCOUNTER — Other Ambulatory Visit: Payer: Self-pay

## 2022-02-13 VITALS — BP 110/65 | HR 77 | Temp 97.6°F | Ht 70.0 in | Wt 219.8 lb

## 2022-02-13 DIAGNOSIS — D3613 Benign neoplasm of peripheral nerves and autonomic nervous system of lower limb, including hip: Secondary | ICD-10-CM

## 2022-02-13 DIAGNOSIS — M79672 Pain in left foot: Secondary | ICD-10-CM

## 2022-02-13 DIAGNOSIS — E1140 Type 2 diabetes mellitus with diabetic neuropathy, unspecified: Secondary | ICD-10-CM

## 2022-02-13 DIAGNOSIS — Z794 Long term (current) use of insulin: Secondary | ICD-10-CM

## 2022-02-13 NOTE — Progress Notes (Signed)
Established patient visit ? ? ?Patient: Samantha [name]   DOB: 05-Sep-1956   66 y.o. Female  MRN: [id number] ?Visit Date: 02/13/2022 ? ?Chief Complaint  ?Patient presents with  ? Foot Swelling  ? ?Subjective  ?  ?HPI  ?The patient states that she is having left foot pain and swelling. States that if she is standing up or is walking for even a short period of time, the foot swells up and it is painful. Always feels as though she has a sock balled up under her foot in her shoe. Continues to feel like that even after she takes her shoe off. She denies injury or trauma. States that this has been going on since November and has gradually gotten worse. She states that taking ibuprofen or tylenol for this has not been helpful.  She does have known history of neuropathy in her feet. She is taking gabapentin '300mg'$ , one in the morning and two in the evening. She is unsure if what she is feeling now is related to neuropathy of if it is something different.  ? ?Medications: ?Outpatient Medications Prior to Visit  ?Medication Sig  ? aspirin 81 MG tablet Take 81 mg by mouth every morning.   ? azelastine (ASTELIN) 0.1 % nasal spray Place 1 spray into both nostrils daily as needed for allergies.   ? budesonide-formoterol (SYMBICORT) 80-4.5 MCG/ACT inhaler Inhale 2 puffs into the lungs 2 (two) times daily.  ? Calcium Citrate-Vitamin D (CALCIUM + D PO) Take 1 tablet by mouth every morning.   ? cetirizine (ZYRTEC) 10 MG tablet Take 10 mg by mouth daily.  ? diclofenac (VOLTAREN) 75 MG EC tablet Take 1 tablet by mouth twice daily  ? diclofenac Sodium (VOLTAREN) 1 % GEL Apply 2 g topically 4 (four) times daily.  ? FLUoxetine (PROZAC) 20 MG capsule Take 1 capsule by mouth once daily in the morning  ? fluticasone (FLONASE) 50 MCG/ACT nasal spray Place 2 sprays into both nostrils daily.  ? gabapentin (NEURONTIN) 300 MG capsule Take 1 capsule by mouth in the morning and 2 capsules in the evening.  ? hydrochlorothiazide (MICROZIDE) 12.5 MG  capsule Take 1 capsule by mouth once daily  ? ipratropium-albuterol (DUONEB) 0.5-2.5 (3) MG/3ML SOLN Inhale 3 mLs into the lungs daily as needed (wheezing).   ? lovastatin (MEVACOR) 20 MG tablet TAKE 1 TABLET BY MOUTH AT BEDTIME  ? metFORMIN (GLUCOPHAGE) 1000 MG tablet TAKE 1 TABLET BY MOUTH TWICE DAILY WITH A MEAL  ? nitroGLYCERIN (NITROSTAT) 0.4 MG SL tablet Place 1 tablet (0.4 mg total) under the tongue every 5 (five) minutes as needed for chest pain.  ? Omega-3 Fatty Acids (FISH OIL) 1000 MG CAPS Take 1,000 mg by mouth every morning.   ? OZEMPIC, 1 MG/DOSE, 4 MG/3ML SOPN INJECT 0.75 MLS (1 MG TOTAL) INTO THE SKIN ONCE A WEEK (USE ONLY ONCE LOWER DOSES ARE FINISHED)  ? pantoprazole (PROTONIX) 40 MG tablet Take 1 tablet (40 mg total) by mouth daily.  ? TRESIBA FLEXTOUCH 100 UNIT/ML FlexTouch Pen INJECT 12 UNITS SUBCUTANEOUSLY ONCE DAILY  ? valsartan (DIOVAN) 160 MG tablet Take 1 tablet by mouth once daily  ? verapamil (CALAN-SR) 240 MG CR tablet TAKE 1 TABLET BY MOUTH ONCE DAILY IN THE MORNING  ? [DISCONTINUED] amoxicillin-clavulanate (AUGMENTIN) 875-125 MG tablet Take 1 tablet by mouth 2 (two) times daily.  ? [DISCONTINUED] methylPREDNISolone (MEDROL DOSEPAK) 4 MG TBPK tablet Take as directed on package.  ? ?Facility-Administered Medications Prior to Visit  ?Medication Dose Route  Frequency Provider  ? nitroGLYCERIN (NITROSTAT) SL tablet 0.4 mg  0.4 mg Sublingual Q5 min PRN Lorrene Reid, PA-C  ? ? ?Review of Systems  ?Constitutional:  Positive for activity change. Negative for appetite change, chills, fatigue and fever.  ?     Decreased activity due to pain and swelling of left foot.   ?HENT:  Negative for congestion, postnasal drip, rhinorrhea, sinus pressure, sinus pain, sneezing and sore throat.   ?Eyes: Negative.   ?Respiratory:  Negative for cough, chest tightness, shortness of breath and wheezing.   ?Cardiovascular:  Negative for chest pain and palpitations.  ?Gastrointestinal:  Negative for abdominal  pain, constipation, diarrhea, nausea and vomiting.  ?Endocrine: Negative for cold intolerance, heat intolerance, polydipsia and polyuria.  ?Genitourinary:  Negative for dyspareunia, dysuria, flank pain, frequency and urgency.  ?Musculoskeletal:  Positive for arthralgias, gait problem, joint swelling and myalgias. Negative for back pain.  ?Skin:  Negative for rash.  ?Allergic/Immunologic: Negative for environmental allergies.  ?Neurological:  Negative for dizziness, weakness and headaches.  ?Hematological:  Negative for adenopathy.  ?Psychiatric/Behavioral:  The patient is not nervous/anxious.   ? ? Objective  ?  ? ?Today's Vitals  ? 02/13/22 1530  ?BP: 110/65  ?Pulse: 77  ?Temp: 97.6 ?F (36.4 ?C)  ?SpO2: 94%  ?Weight: 219 lb 12.8 oz (99.7 kg)  ?Height: '5\' 10"'$  (1.778 m)  ? ?Body mass index is 31.54 kg/m?.  ? ?Physical Exam ?Vitals and nursing note reviewed.  ?Constitutional:   ?   Appearance: Normal appearance. She is well-developed.  ?HENT:  ?   Head: Normocephalic.  ?Eyes:  ?   Pupils: Pupils are equal, round, and reactive to light.  ?Cardiovascular:  ?   Rate and Rhythm: Normal rate and regular rhythm.  ?   Pulses:     ?     Dorsalis pedis pulses are 1+ on the right side.  ?     Posterior tibial pulses are 1+ on the right side and 1+ on the left side.  ?   Heart sounds: Normal heart sounds.  ?Pulmonary:  ?   Effort: Pulmonary effort is normal.  ?   Breath sounds: Normal breath sounds.  ?Abdominal:  ?   Palpations: Abdomen is soft.  ?Musculoskeletal:     ?   General: Normal range of motion.  ?   Cervical back: Normal range of motion and neck supple.  ?   Right foot: Normal range of motion. No deformity, bunion, Charcot foot, foot drop or prominent metatarsal heads.  ?   Left foot: Normal range of motion. No bunion, Charcot foot, foot drop or prominent metatarsal heads.  ?     Feet: ? ?Feet:  ?   Right foot:  ?   Skin integrity: Skin integrity normal.  ?   Toenail Condition: Right toenails are normal.  ?   Left  foot:  ?   Skin integrity: Skin integrity normal.  ?   Toenail Condition: Left toenails are normal.  ?Lymphadenopathy:  ?   Cervical: No cervical adenopathy.  ?Skin: ?   General: Skin is warm and dry.  ?   Capillary Refill: Capillary refill takes less than 2 seconds.  ?Neurological:  ?   General: No focal deficit present.  ?   Mental Status: She is alert and oriented to person, place, and time.  ?Psychiatric:     ?   Mood and Affect: Mood normal.     ?   Behavior: Behavior normal.     ?  Thought Content: Thought content normal.     ?   Judgment: Judgment normal.  ?  ? ? Assessment & Plan  ?  ?1. Left foot pain ?Diabetic neuropathy is top differential diagnosis. Already on gabapentin =, 300 mg in am and 600 mg in the evening. She may conitnue to take tylenol and/or ibuprofen for pain and inflammation. Refer to podiatry for further evaluation.  ?- Ambulatory referral to Podiatry ? ?2. Neuroma of foot ?Palpable hard knot on dorsal surface of the left foot which is tender to palpate. May be neuroma. Will refer to podiatry for further evaluation.  ?- Ambulatory referral to Podiatry ? ?3. Type 2 diabetes mellitus with diabetic neuropathy, with long-term current use of insulin ([place]) ?Patient does have insulin dependant type 2 diabetes and is already taking gabapentin. Refer to podiatry for further evaluation and treatment.  ?- Ambulatory referral to Podiatry  ? ?Return for prn worsening or persistent symptoms.  ?   ? ? ? ?Ronnell Freshwater, NP  ?[facility] ?[phone] (phone) ?[phone] (fax) ? ?[facility] ?

## 2022-02-14 ENCOUNTER — Encounter: Payer: Self-pay | Admitting: Physician Assistant

## 2022-02-14 ENCOUNTER — Ambulatory Visit (INDEPENDENT_AMBULATORY_CARE_PROVIDER_SITE_OTHER): Payer: Medicare HMO | Admitting: Physician Assistant

## 2022-02-14 VITALS — BP 110/70 | Ht 70.0 in | Wt 219.0 lb

## 2022-02-14 DIAGNOSIS — Z0000 Encounter for general adult medical examination without abnormal findings: Secondary | ICD-10-CM | POA: Diagnosis not present

## 2022-02-14 DIAGNOSIS — E2839 Other primary ovarian failure: Secondary | ICD-10-CM | POA: Diagnosis not present

## 2022-02-14 DIAGNOSIS — Z780 Asymptomatic menopausal state: Secondary | ICD-10-CM

## 2022-02-14 DIAGNOSIS — Z1231 Encounter for screening mammogram for malignant neoplasm of breast: Secondary | ICD-10-CM

## 2022-02-14 NOTE — Progress Notes (Addendum)
Subjective:   Samantha [name] is a 66 y.o. female who presents for Medicare Annual (Subsequent) preventive examination.  Review of Systems    Refer to PCP  I connected with  Pershing Proud on 02/14/22 by an audio only telemedicine application and verified that I am speaking with the correct person using two identifiers.   I discussed the limitations, risks, security and privacy concerns of performing an evaluation and management service by telephone and the availability of in person appointments. I also discussed with the patient that there may be a patient responsible charge related to this service. The patient expressed understanding and verbally consented to this telephonic visit.  Location of Patient: Home Location of Provider:Office  List any persons and their role that are participating in the visit with the patient. Sophie Tamez, CMA      Objective:    Today's Vitals   02/14/22 1112  BP: 110/70  Weight: 219 lb (99.3 kg)  Height: 5\' 10"  (1.778 m)   Body mass index is 31.42 kg/m.  Advanced Directives 08/01/2021 06/30/2021 08/10/2019 07/12/2017 07/05/2017 08/09/2016  Does Patient Have a Medical Advance Directive? No No No No No Yes  Type of Advance Directive - - - - - Living will  Would patient like information on creating a medical advance directive? No - Patient declined No - Patient declined No - Patient declined - No - Patient declined -    Current Medications (verified) Outpatient Encounter Medications as of 02/14/2022  Medication Sig   aspirin 81 MG tablet Take 81 mg by mouth every morning.    azelastine (ASTELIN) 0.1 % nasal spray Place 1 spray into both nostrils daily as needed for allergies.    budesonide-formoterol (SYMBICORT) 80-4.5 MCG/ACT inhaler Inhale 2 puffs into the lungs 2 (two) times daily.   Calcium Citrate-Vitamin D (CALCIUM + D PO) Take 1 tablet by mouth every morning.    cetirizine (ZYRTEC) 10 MG tablet Take 10 mg by mouth daily.   diclofenac  (VOLTAREN) 75 MG EC tablet Take 1 tablet by mouth twice daily   diclofenac Sodium (VOLTAREN) 1 % GEL Apply 2 g topically 4 (four) times daily.   FLUoxetine (PROZAC) 20 MG capsule Take 1 capsule by mouth once daily in the morning   fluticasone (FLONASE) 50 MCG/ACT nasal spray Place 2 sprays into both nostrils daily.   gabapentin (NEURONTIN) 300 MG capsule Take 1 capsule by mouth in the morning and 2 capsules in the evening.   hydrochlorothiazide (MICROZIDE) 12.5 MG capsule Take 1 capsule by mouth once daily   ipratropium-albuterol (DUONEB) 0.5-2.5 (3) MG/3ML SOLN Inhale 3 mLs into the lungs daily as needed (wheezing).    lovastatin (MEVACOR) 20 MG tablet TAKE 1 TABLET BY MOUTH AT BEDTIME   metFORMIN (GLUCOPHAGE) 1000 MG tablet TAKE 1 TABLET BY MOUTH TWICE DAILY WITH A MEAL   nitroGLYCERIN (NITROSTAT) 0.4 MG SL tablet Place 1 tablet (0.4 mg total) under the tongue every 5 (five) minutes as needed for chest pain.   Omega-3 Fatty Acids (FISH OIL) 1000 MG CAPS Take 1,000 mg by mouth every morning.    OZEMPIC, 1 MG/DOSE, 4 MG/3ML SOPN INJECT 0.75 MLS (1 MG TOTAL) INTO THE SKIN ONCE A WEEK (USE ONLY ONCE LOWER DOSES ARE FINISHED)   pantoprazole (PROTONIX) 40 MG tablet Take 1 tablet (40 mg total) by mouth daily.   TRESIBA FLEXTOUCH 100 UNIT/ML FlexTouch Pen INJECT 12 UNITS SUBCUTANEOUSLY ONCE DAILY   valsartan (DIOVAN) 160 MG tablet Take 1 tablet by mouth  once daily   verapamil (CALAN-SR) 240 MG CR tablet TAKE 1 TABLET BY MOUTH ONCE DAILY IN THE MORNING   Facility-Administered Encounter Medications as of 02/14/2022  Medication   nitroGLYCERIN (NITROSTAT) SL tablet 0.4 mg    Allergies (verified) Iohexol, Iodinated contrast media, and Jardiance [empagliflozin]   History: Past Medical History:  Diagnosis Date   Arthritis    Dyslipidemia    Heart disease    Heart murmur 2011   Left bundle branch block 2011   Type II or unspecified type diabetes mellitus without mention of complication, not stated  as uncontrolled    Unspecified essential hypertension    Past Surgical History:  Procedure Laterality Date   LEFT HEART CATH AND CORONARY ANGIOGRAPHY N/A 06/30/2021   Procedure: LEFT HEART CATH AND CORONARY ANGIOGRAPHY;  Surgeon: Lennette Bihari, MD;  Location: [facility];  Service: Cardiovascular;  Laterality: N/A;   REPLACEMENT TOTAL KNEE Left 2017   TONSILLECTOMY  1962   Family History  Problem Relation Age of Onset   Diabetes Mother    Hypertension Mother    CAD Mother    Hypertension Father    Diabetes Father    CAD Father    Diabetes Brother    Hyperlipidemia Brother    Hypertension Brother    CAD Brother    Diabetes Sister    Hypertension Sister    Social History   Socioeconomic History   Marital status: Married    Spouse name: Not on file   Number of children: 1   Years of education: Not on file   Highest education level: Not on file  Occupational History   Not on file  Tobacco Use   Smoking status: Never   Smokeless tobacco: Never  Vaping Use   Vaping Use: Never used  Substance and Sexual Activity   Alcohol use: No   Drug use: No   Sexual activity: Not Currently  Other Topics Concern   Not on file  Social History Narrative   Not on file   Social Determinants of Health   Financial Resource Strain: Low Risk    Difficulty of Paying Living Expenses: Not very hard  Food Insecurity: No Food Insecurity   Worried About Programme researcher, broadcasting/film/video in the Last Year: Never true   Ran Out of Food in the Last Year: Never true  Transportation Needs: No Transportation Needs   Lack of Transportation (Medical): No   Lack of Transportation (Non-Medical): No  Physical Activity: Insufficiently Active   Days of Exercise per Week: 2 days   Minutes of Exercise per Session: 20 min  Stress: No Stress Concern Present   Feeling of Stress : Not at all  Social Connections: Moderately Integrated   Frequency of Communication with Friends and Family: More than three times a  week   Frequency of Social Gatherings with Friends and Family: Three times a week   Attends Religious Services: More than 4 times per year   Active Member of Clubs or Organizations: No   Attends Banker Meetings: Never   Marital Status: Married    Tobacco Counseling Counseling given: Not Answered   Clinical Intake:                 Diabetic?Yes         Activities of Daily Living In your present state of health, do you have any difficulty performing the following activities: 02/14/2022 09/22/2021  Hearing? N N  Vision? Y N  Difficulty  concentrating or making decisions? N N  Walking or climbing stairs? Y Y  Dressing or bathing? N N  Doing errands, shopping? N N  Some recent data might be hidden    Patient Care Team: Mayer Masker, PA-C as PCP - General Nahser, Deloris Ping, MD as PCP - Cardiology (Cardiology) Nahser, Deloris Ping, MD as Consulting Physician (Cardiology) Leslye Peer, MD as Consulting Physician (Pulmonary Disease) [facility] Jeani Hawking, MD as Consulting Physician (Gastroenterology) [facility], [facility] (Optometry) [facility]  Indicate any recent Medical Services you may have received from other than [facility] providers in the past year (date may be approximate).     Assessment:   This is a routine wellness examination for [place].  Hearing/Vision screen No results found.  Dietary issues and exercise activities discussed:     Goals Addressed   None   Depression Screen PHQ 2/9 Scores 02/14/2022 09/22/2021 05/23/2021 01/14/2021 09/13/2020 05/20/2020 02/23/2020  PHQ - 2 Score 0 0 0 0 0 0 2  PHQ- 9 Score - 0 1 1 3  0 8    Fall Risk Fall Risk  02/14/2022 09/22/2021 05/23/2021 01/14/2021 09/13/2020  Falls in the past year? 1 0 0 0 0  Number falls in past yr: 0 0 0 - -  Injury with Fall? 0 0 0 - -  Risk for fall due to : No Fall Risks No Fall Risks No Fall Risks - No Fall Risks   Follow up Falls evaluation completed Falls evaluation completed Falls evaluation completed Falls evaluation completed Falls evaluation completed    FALL RISK PREVENTION PERTAINING TO THE HOME:  Any stairs in or around the home? Yes  If so, are there any without handrails? Yes  Home free of loose throw rugs in walkways, pet beds, electrical cords, etc? Yes  Adequate lighting in your home to reduce risk of falls? Yes   ASSISTIVE DEVICES UTILIZED TO PREVENT FALLS:  Life alert? Yes  Use of a cane, walker or w/c? No  Grab bars in the bathroom? No  Shower chair or bench in shower? No  Elevated toilet seat or a handicapped toilet? No   TIMED UP AND GO:  Was the test performed? No .  Length of time to ambulate 10 feet:  sec.   Telehealth   Cognitive Function:     6CIT Screen 02/14/2022  What Year? 0 points  What month? 0 points  What time? 0 points  Count back from 20 0 points  Months in reverse 0 points  Repeat phrase 0 points  Total Score 0    Immunizations Immunization History  Administered Date(s) Administered   Fluad Quad(high Dose 65+) 09/13/2020, 09/26/2021   Influenza Inj Mdck Quad Pf 08/21/2016, 09/03/2018   Influenza Split 08/12/2014, 11/12/2015   Influenza, Seasonal, Injecte, Preservative Fre 08/12/2014, 11/12/2015, 08/21/2016, 08/31/2017   Influenza,inj,quad, With Preservative 08/21/2016   Influenza-Unspecified 08/13/2013   Moderna Sars-Covid-2 Vaccination 01/14/2020, 02/11/2020, 10/16/2020   Pneumococcal Polysaccharide-23 11/27/2010   Pneumococcal-Unspecified 11/27/2010   Tdap 08/07/2012   Zoster Recombinat (Shingrix) 06/27/2018, 09/03/2018    TDAP status: Up to date  Flu Vaccine status: Up to date  Pneumococcal vaccine status: Up to date  Covid-19 vaccine status: Completed vaccines  Qualifies for Shingles Vaccine? Yes   Zostavax completed Yes   Shingrix Completed?: Yes  Screening Tests Health Maintenance  Topic Date Due   Hepatitis C  Screening  Never done   Pneumonia Vaccine 77+ Years old (2 - PCV) 11/28/2011  MAMMOGRAM  02/15/2018   DEXA SCAN  Never done   COVID-19 Vaccine (4 - Booster for Moderna series) 12/11/2020   OPHTHALMOLOGY EXAM  01/27/2022   HEMOGLOBIN A1C  03/14/2022   FOOT EXAM  05/23/2022   TETANUS/TDAP  08/07/2022   COLONOSCOPY (Pts 45-24yrs Insurance coverage will need to be confirmed)  12/03/2029   INFLUENZA VACCINE  Completed   Zoster Vaccines- Shingrix  Completed   HPV VACCINES  Aged Out    Health Maintenance  Health Maintenance Due  Topic Date Due   Hepatitis C Screening  Never done   Pneumonia Vaccine 65+ Years old (2 - PCV) 11/28/2011   MAMMOGRAM  02/15/2018   DEXA SCAN  Never done   COVID-19 Vaccine (4 - Booster for Moderna series) 12/11/2020   OPHTHALMOLOGY EXAM  01/27/2022    Colorectal cancer screening: Type of screening: Colonoscopy. Completed 2021. Repeat every 10 years  Mammogram status: Ordered today. Pt provided with contact info and advised to call to schedule appt.   Bone Density status: Ordered today. Pt provided with contact info and advised to call to schedule appt.  Lung Cancer Screening: (Low Dose CT Chest recommended if Age 65-80 years, 30 pack-year currently smoking OR have quit w/in 15years.) does not qualify.   Lung Cancer Screening Referral:   Additional Screening:  Hepatitis C Screening: does qualify; patient declined  Vision Screening: Recommended annual ophthalmology exams for early detection of glaucoma and other disorders of the eye. Is the patient up to date with their annual eye exam?  yes Who is the provider or what is the name of the office in which the patient attends annual eye exams?  If pt is not established with a provider, would they like to be referred to a provider to establish care? No .   Dental Screening: Recommended annual dental exams for proper oral hygiene  Community Resource Referral / Chronic Care Management: CRR required this  visit?  No   CCM required this visit?  No      Plan:     I have personally reviewed and noted the following in the patients chart:   Medical and social history Use of alcohol, tobacco or illicit drugs  Current medications and supplements including opioid prescriptions.  Functional ability and status Nutritional status Physical activity Advanced directives List of other physicians Hospitalizations, surgeries, and ER visits in previous 12 months Vitals Screenings to include cognitive, depression, and falls Referrals and appointments  In addition, I have reviewed and discussed with patient certain preventive protocols, quality metrics, and best practice recommendations. A written personalized care plan for preventive services as well as general preventive health recommendations were provided to patient.     Mayer Masker, PA-C   02/14/2022   Nurse Notes: Non face to face. Time: 20 minutes    Ms. [place] , Thank you for taking time to come for your Medicare Wellness Visit. I appreciate your ongoing commitment to your health goals. Please review the following plan we discussed and let me know if I can assist you in the future.   These are the goals we discussed: Goals:  *Coming in for DM follow up in next few days *Getting Tdap in September 2023   This is a list of the screening recommended for you and due dates:  Health Maintenance  Topic Date Due   Hepatitis C Screening: USPSTF Recommendation to screen - Ages 12-79 yo.  Never done   Pneumonia Vaccine (2 - PCV) 11/28/2011   Mammogram  02/15/2018   DEXA scan (bone density measurement)  Never done   COVID-19 Vaccine (4 - Booster for Moderna series) 12/11/2020   Eye exam for diabetics  01/27/2022   Hemoglobin A1C  03/14/2022   Complete foot exam   05/23/2022   Tetanus Vaccine  08/07/2022   Colon Cancer Screening  12/03/2029   Flu Shot  Completed   Zoster (Shingles) Vaccine  Completed   HPV Vaccine  Aged Out

## 2022-02-14 NOTE — Patient Instructions (Signed)
Preventive Care 34 Years and Older, Female ?Preventive care refers to lifestyle choices and visits with your health care provider that can promote health and wellness. Preventive care visits are also called wellness exams. ?What can I expect for my preventive care visit? ?Counseling ?Your health care provider may ask you questions about your: ?Medical history, including: ?Past medical problems. ?Family medical history. ?Pregnancy and menstrual history. ?History of falls. ?Current health, including: ?Memory and ability to understand (cognition). ?Emotional well-being. ?Home life and relationship well-being. ?Sexual activity and sexual health. ?Lifestyle, including: ?Alcohol, nicotine or tobacco, and drug use. ?Access to firearms. ?Diet, exercise, and sleep habits. ?Work and work Statistician. ?Sunscreen use. ?Safety issues such as seatbelt and bike helmet use. ?Physical exam ?Your health care provider will check your: ?Height and weight. These may be used to calculate your BMI (body mass index). BMI is a measurement that tells if you are at a healthy weight. ?Waist circumference. This measures the distance around your waistline. This measurement also tells if you are at a healthy weight and may help predict your risk of certain diseases, such as type 2 diabetes and high blood pressure. ?Heart rate and blood pressure. ?Body temperature. ?Skin for abnormal spots. ?What immunizations do I need? ?Vaccines are usually given at various ages, according to a schedule. Your health care provider will recommend vaccines for you based on your age, medical history, and lifestyle or other factors, such as travel or where you work. ?What tests do I need? ?Screening ?Your health care provider may recommend screening tests for certain conditions. This may include: ?Lipid and cholesterol levels. ?Hepatitis C test. ?Hepatitis B test. ?HIV (human immunodeficiency virus) test. ?STI (sexually transmitted infection) testing, if you are at  risk. ?Lung cancer screening. ?Colorectal cancer screening. ?Diabetes screening. This is done by checking your blood sugar (glucose) after you have not eaten for a while (fasting). ?Mammogram. Talk with your health care provider about how often you should have regular mammograms. ?BRCA-related cancer screening. This may be done if you have a family history of breast, ovarian, tubal, or peritoneal cancers. ?Bone density scan. This is done to screen for osteoporosis. ?Talk with your health care provider about your test results, treatment options, and if necessary, the need for more tests. ?Follow these instructions at home: ?Eating and drinking ? ?Eat a diet that includes fresh fruits and vegetables, whole grains, lean protein, and low-fat dairy products. Limit your intake of foods with high amounts of sugar, saturated fats, and salt. ?Take vitamin and mineral supplements as recommended by your health care provider. ?Do not drink alcohol if your health care provider tells you not to drink. ?If you drink alcohol: ?Limit how much you have to 0-1 drink a day. ?Know how much alcohol is in your drink. In the [place] one drink equals one 12 oz bottle of beer (355 mL), one 5 oz glass of wine (148 mL), or one 1? oz glass of hard liquor (44 mL). ?Lifestyle ?Brush your teeth every morning and night with fluoride toothpaste. Floss one time each day. ?Exercise for at least 30 minutes 5 or more days each week. ?Do not use any products that contain nicotine or tobacco. These products include cigarettes, chewing tobacco, and vaping devices, such as e-cigarettes. If you need help quitting, ask your health care provider. ?Do not use drugs. ?If you are sexually active, practice safe sex. Use a condom or other form of protection in order to prevent STIs. ?Take aspirin only as told by your  health care provider. Make sure that you understand how much to take and what form to take. Work with your health care provider to find out whether it  is safe and beneficial for you to take aspirin daily. ?Ask your health care provider if you need to take a cholesterol-lowering medicine (statin). ?Find healthy ways to manage stress, such as: ?Meditation, yoga, or listening to music. ?Journaling. ?Talking to a trusted person. ?Spending time with friends and family. ?Minimize exposure to UV radiation to reduce your risk of skin cancer. ?Safety ?Always wear your seat belt while driving or riding in a vehicle. ?Do not drive: ?If you have been drinking alcohol. Do not ride with someone who has been drinking. ?When you are tired or distracted. ?While texting. ?If you have been using any mind-altering substances or drugs. ?Wear a helmet and other protective equipment during sports activities. ?If you have firearms in your house, make sure you follow all gun safety procedures. ?What's next? ?Visit your health care provider once a year for an annual wellness visit. ?Ask your health care provider how often you should have your eyes and teeth checked. ?Stay up to date on all vaccines. ?This information is not intended to replace advice given to you by your health care provider. Make sure you discuss any questions you have with your health care provider. ?Document Revised: 05/11/2021 Document Reviewed: 05/11/2021 ?Elsevier Patient Education ? [place]. ? ?

## 2022-02-20 ENCOUNTER — Ambulatory Visit (INDEPENDENT_AMBULATORY_CARE_PROVIDER_SITE_OTHER): Payer: Medicare HMO

## 2022-02-20 ENCOUNTER — Ambulatory Visit: Payer: Medicare HMO | Admitting: Podiatrist

## 2022-02-20 ENCOUNTER — Encounter: Payer: Self-pay | Admitting: Podiatrist

## 2022-02-20 ENCOUNTER — Other Ambulatory Visit: Payer: Self-pay

## 2022-02-20 DIAGNOSIS — G5762 Lesion of plantar nerve, left lower limb: Secondary | ICD-10-CM

## 2022-02-20 DIAGNOSIS — D3613 Benign neoplasm of peripheral nerves and autonomic nervous system of lower limb, including hip: Secondary | ICD-10-CM

## 2022-02-20 DIAGNOSIS — D3610 Benign neoplasm of peripheral nerves and autonomic nervous system, unspecified: Secondary | ICD-10-CM

## 2022-02-20 DIAGNOSIS — M79672 Pain in left foot: Secondary | ICD-10-CM | POA: Insufficient documentation

## 2022-02-20 NOTE — Patient Instructions (Signed)
Morton Neuroma ?Morton neuroma is foot pain that affects the ball of the foot and the area near the toes. Morton neuralgia occurs when part of a nerve in the foot (digital nerve) is under too much pressure (compressed). When this happens over a long period of time, the nerve can thicken (neuroma) and cause pain. Pain usually occurs between the third and fourth toes.  ?Morton neuralgia can come and go but may get worse over time. ?What are the causes? ?This condition is caused by doing the same things over and over with your foot, such as: ?Activities such as running or jumping. ?Wearing shoes that are too tight. ?What increases the risk? ?You may be at higher risk for Morton neuralgia if you: ?Are female. ?Wear high heels. ?Wear shoes that are narrow or tight. ?Do activities that repeatedly stretch your toes, such as: ?Running. ?Summers. ?Long-distance walking. ?What are the signs or symptoms? ?The first symptom of Morton neuralgia is pain that spreads from the ball of the foot to the toes. It may feel like you are walking on a marble. Pain usually gets worse with walking and goes away at night. Other symptoms may include numbness and cramping of your toes. Both feet are equally affected, but rarely at the same time. ?How is this diagnosed? ?This condition is diagnosed based on your symptoms, your medical history, and a physical exam. Your health care provider may: ?Squeeze your foot just behind your toe. ?Ask you to move your toes to check for pain. ?Ask about your physical activity level. ?You also may have imaging tests, such as an X-ray, ultrasound, or MRI. ?How is this treated? ?Treatment depends on how severe your condition is and what causes it. Treatment may involve: ?Wearing different shoes that are not too tight, are low-heeled, and provide good support. For some people, this is the only treatment needed. ?Wearing an over-the-counter or custom supportive pad (orthotic) under the front of your foot. ?Getting  injections of numbing medicine and anti-inflammatory medicine (steroid) in the nerve. ?Having surgery to remove part of the thickened nerve. ?Follow these instructions at home: ?Managing pain, stiffness, and swelling ? ?Massage your foot as needed. ?Wear orthotics as told by your health care provider. ?If directed, put ice on the painful area. To do this: ?Put ice in a plastic bag. ?Place a towel between your skin and the bag. ?Leave the ice on for 20 minutes, 2-3 times a day. ?Remove the ice if your skin turns bright red. This is very important. If you cannot feel pain, heat, or cold, you have a greater risk of damage to the area. ?Raise (elevate) the injured area above the level of your heart while you are sitting or lying down. ?Avoid activities that cause pain or make pain worse. If you play sports, ask your health care provider when it is safe for you to return to sports. ?General instructions ?Take over-the-counter and prescription medicines only as told by your health care provider. ?For the time period you were told by your health care provider, do not drive or use machinery. ?Wear shoes that: ?Have soft soles. ?Have a wide toe area. ?Provide arch support. ?Do not pinch or squeeze your feet. ?Have room for your orthotics, if this applies. ?Keep all follow-up visits. This is important. ?Contact a health care provider if: ?Your symptoms get worse or do not get better with treatment and home care. ?Summary ?Morton neuralgia is foot pain that affects the ball of the foot and the area  near the toes. Pain usually occurs between the third and fourth toes, gets worse with walking, and goes away at night. ?Morton neuralgia occurs when part of a nerve in the foot (digital nerve) is under too much pressure. When this happens over a long period of time, the nerve can thicken (neuroma) and cause pain. ?This condition is caused by doing the same things over and over with your foot, such as running or jumping, wearing  shoes that are too tight, or wearing high heels. ?Treatment may involve wearing low-heeled shoes that are not too tight, wearing a supportive pad (orthotic) under the front of your foot, getting injections in the nerve, or having surgery to remove part of the thickened nerve. ?This information is not intended to replace advice given to you by your health care provider. Make sure you discuss any questions you have with your health care provider. ?Document Revised: 04/22/2021 Document Reviewed: 04/22/2021 ?Elsevier Patient Education ? [place]. ? ?

## 2022-02-22 ENCOUNTER — Encounter: Payer: Self-pay | Admitting: Physician Assistant

## 2022-02-22 ENCOUNTER — Ambulatory Visit (INDEPENDENT_AMBULATORY_CARE_PROVIDER_SITE_OTHER): Payer: Medicare HMO | Admitting: Physician Assistant

## 2022-02-22 VITALS — BP 113/62 | HR 84 | Temp 97.7°F | Ht 70.0 in | Wt 221.0 lb

## 2022-02-22 DIAGNOSIS — E1140 Type 2 diabetes mellitus with diabetic neuropathy, unspecified: Secondary | ICD-10-CM

## 2022-02-22 DIAGNOSIS — Z794 Long term (current) use of insulin: Secondary | ICD-10-CM | POA: Diagnosis not present

## 2022-02-22 LAB — POCT GLYCOSYLATED HEMOGLOBIN (HGB A1C): Hemoglobin A1C: 6.6 % — AB (ref 4.0–5.6)

## 2022-02-22 NOTE — Progress Notes (Deleted)
?Established patient visit ? ? ?Patient: Samantha [name]   DOB: 1956/07/25   66 y.o. Female  MRN: [id number] ?Visit Date: 02/22/2022 ? ?No chief complaint on file. ? ?Subjective  ?  ?HPI  ?*** ?Diabetes: Pt denies increased urination or thirst. Pt reports medication compliance. No hypoglycemic events. Checking glucose at home. FBS range from*** ? ?HTN: Pt denies chest pain, palpitations, dizziness or leg swelling. Taking medication as directed without side effects. Checks BP at home ***times/wk and readings range in ***. Pt follows a low salt diet. ? ?HLD: Pt taking medication as directed without issues. Denies side effects including myalgias and RUQ pain.  ? ? ?Medications: ?Outpatient Medications Prior to Visit  ?Medication Sig  ? aspirin 81 MG tablet Take 81 mg by mouth every morning.   ? azelastine (ASTELIN) 0.1 % nasal spray Place 1 spray into both nostrils daily as needed for allergies.   ? budesonide-formoterol (SYMBICORT) 80-4.5 MCG/ACT inhaler Inhale 2 puffs into the lungs 2 (two) times daily.  ? Calcium Citrate-Vitamin D (CALCIUM + D PO) Take 1 tablet by mouth every morning.   ? cetirizine (ZYRTEC) 10 MG tablet Take 10 mg by mouth daily.  ? diclofenac (VOLTAREN) 75 MG EC tablet Take 1 tablet by mouth twice daily  ? diclofenac Sodium (VOLTAREN) 1 % GEL Apply 2 g topically 4 (four) times daily.  ? FLUoxetine (PROZAC) 20 MG capsule Take 1 capsule by mouth once daily in the morning  ? fluticasone (FLONASE) 50 MCG/ACT nasal spray Place 2 sprays into both nostrils daily.  ? gabapentin (NEURONTIN) 300 MG capsule Take 1 capsule by mouth in the morning and 2 capsules in the evening.  ? hydrochlorothiazide (MICROZIDE) 12.5 MG capsule Take 1 capsule by mouth once daily  ? ipratropium-albuterol (DUONEB) 0.5-2.5 (3) MG/3ML SOLN Inhale 3 mLs into the lungs daily as needed (wheezing).   ? lovastatin (MEVACOR) 20 MG tablet TAKE 1 TABLET BY MOUTH AT BEDTIME  ? metFORMIN (GLUCOPHAGE) 1000 MG tablet TAKE 1 TABLET BY MOUTH  TWICE DAILY WITH A MEAL  ? nitroGLYCERIN (NITROSTAT) 0.4 MG SL tablet Place 1 tablet (0.4 mg total) under the tongue every 5 (five) minutes as needed for chest pain.  ? Omega-3 Fatty Acids (FISH OIL) 1000 MG CAPS Take 1,000 mg by mouth every morning.   ? OZEMPIC, 1 MG/DOSE, 4 MG/3ML SOPN INJECT 0.75 MLS (1 MG TOTAL) INTO THE SKIN ONCE A WEEK (USE ONLY ONCE LOWER DOSES ARE FINISHED)  ? pantoprazole (PROTONIX) 40 MG tablet Take 1 tablet (40 mg total) by mouth daily.  ? TRESIBA FLEXTOUCH 100 UNIT/ML FlexTouch Pen INJECT 12 UNITS SUBCUTANEOUSLY ONCE DAILY  ? valsartan (DIOVAN) 160 MG tablet Take 1 tablet by mouth once daily  ? verapamil (CALAN-SR) 240 MG CR tablet TAKE 1 TABLET BY MOUTH ONCE DAILY IN THE MORNING  ? ?Facility-Administered Medications Prior to Visit  ?Medication Dose Route Frequency Provider  ? nitroGLYCERIN (NITROSTAT) SL tablet 0.4 mg  0.4 mg Sublingual Q5 min PRN Lorrene Reid, PA-C  ? ? ?Review of Systems ? ?{Labs  Heme  Chem  Endocrine  Serology  Results Review (optional):[id number]} ?  Objective  ?  ?There were no vitals taken for this visit. ?BP Readings from Last 3 Encounters:  ?02/14/22 110/70  ?02/13/22 110/65  ?09/22/21 116/65  ? ?Wt Readings from Last 3 Encounters:  ?02/14/22 219 lb (99.3 kg)  ?02/13/22 219 lb 12.8 oz (99.7 kg)  ?01/12/22 216 lb (98 kg)  ? ? ?Physical Exam  ?*** ? ?  No results found for any visits on 02/22/22. ? Assessment & Plan  ?  ? ?*** ?Problem List Items Addressed This Visit   ?None ? ? ?No follow-ups on file.  ?   ? ? ? ?Lorrene Reid, PA-C  ?[facility] ?[phone] (phone) ?[phone] (fax) ? ?[facility] ?

## 2022-02-23 LAB — COMPREHENSIVE METABOLIC PANEL
ALT: 18 IU/L (ref 0–32)
AST: 16 IU/L (ref 0–40)
Albumin/Globulin Ratio: 1.6 (ref 1.2–2.2)
Albumin: 4.2 g/dL (ref 3.8–4.8)
Alkaline Phosphatase: 74 IU/L (ref 44–121)
BUN/Creatinine Ratio: 20 (ref 12–28)
BUN: 25 mg/dL (ref 8–27)
Bilirubin Total: 0.3 mg/dL (ref 0.0–1.2)
CO2: 21 mmol/L (ref 20–29)
Calcium: 9.3 mg/dL (ref 8.7–10.3)
Chloride: 100 mmol/L (ref 96–106)
Creatinine, Ser: 1.27 mg/dL — ABNORMAL HIGH (ref 0.57–1.00)
Globulin, Total: 2.7 g/dL (ref 1.5–4.5)
Glucose: 175 mg/dL — ABNORMAL HIGH (ref 70–99)
Potassium: 5 mmol/L (ref 3.5–5.2)
Sodium: 140 mmol/L (ref 134–144)
Total Protein: 6.9 g/dL (ref 6.0–8.5)
eGFR: 46 mL/min/{1.73_m2} — ABNORMAL LOW (ref >59)

## 2022-02-23 NOTE — Progress Notes (Signed)
Patient came in for A1c and labs. Due for chronic f/up in 3 months. Had annual Medicare Wellness 02/14/22. A1c improved and stable. No med changes. MA, PA-C ?

## 2022-02-24 MED ORDER — TRIAMCINOLONE ACETONIDE 10 MG/ML IJ SUSP
10.0000 mg | Freq: Once | INTRAMUSCULAR | Status: AC
  Administered 2022-02-20: 10 mg

## 2022-02-24 NOTE — Progress Notes (Signed)
?Chief Complaint  ?Patient presents with  ? Foot Pain  ?  Left foot pain under the toes with numbness.  Feels like sock is balled up under her toes.  She has noticed the pain since November of 2022.   ? ? ?HPI: Patient is 66 y.o. female who presents today for pain in her left foot and numbness towards the ball of her foot and her toes. She relates the pain is consistent and feels like there is a balled up sock under her foot. She has tried different shoes with no relief.  ? ?Patient Active Problem List  ? Diagnosis Date Noted  ? Left foot pain 02/20/2022  ? Neuroma of foot 02/20/2022  ? Unstable angina (HCC)   ? Arthritis 01/14/2021  ? Bowel incontinence 01/14/2021  ? ETD (eustachian tube dysfunction) 01/14/2021  ? Left lower quadrant pain 01/14/2021  ? Personal history of colonic polyps 01/14/2021  ? Screening for malignant neoplasm of colon 01/14/2021  ? Hyperlipidemia associated with type 2 diabetes mellitus ([place]) 02/23/2020  ? Microalbuminuria due to type 2 diabetes mellitus ([place]) 02/23/2020  ? Type 2 diabetes mellitus with morbid obesity ([place]) 02/23/2020  ? B12 deficiency 02/23/2020  ? Vitamin D deficiency 02/23/2020  ? Family history of heart disease 02/23/2020  ? Family history of diabetes mellitus (DM) 02/23/2020  ? Family history of colon cancer-grandmother 02/23/2020  ? Family history of mixed hyperlipidemia 02/23/2020  ? Family history of hypertension 02/23/2020  ? Mood disorder ([place]) 02/23/2020  ? Degenerative disc disease, lumbar 07/19/2018  ? 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  ? Spondylosis of lumbar spine 07/19/2018  ? Lumbar radiculopathy 07/05/2018  ? 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 Eustachian tube dysfunction, bilateral 02/21/2018  ? Recurrent sinusitis 02/21/2018  ? Tympanic membrane retraction, left 02/21/2018  ? GERD (gastroesophageal reflux disease) 10/24/2017  ? Cough 08/14/2017  ? Dyspnea 08/14/2017  ? Pulmonary  nodule, right 08/14/2017  ? Back pain 07/12/2017  ? Obesity (BMI 30-39.9) 06/11/2017  ? LBBB (left bundle branch block) 08/10/2016  ? Leukocytosis 08/10/2016  ? Hypertension associated with diabetes ([place]) 08/10/2016  ? Pain in the chest 08/09/2016  ? Diabetes ([place]) 11/20/2015  ? ? ?Current Outpatient Medications on File Prior to Visit  ?Medication Sig Dispense Refill  ? aspirin 81 MG tablet Take 81 mg by mouth every morning.     ? azelastine (ASTELIN) 0.1 % nasal spray Place 1 spray into both nostrils daily as needed for allergies.     ? budesonide-formoterol (SYMBICORT) 80-4.5 MCG/ACT inhaler Inhale 2 puffs into the lungs 2 (two) times daily. 1 Inhaler 12  ? Calcium Citrate-Vitamin D (CALCIUM + D PO) Take 1 tablet by mouth every morning.     ? cetirizine (ZYRTEC) 10 MG tablet Take 10 mg by mouth daily.    ? diclofenac (VOLTAREN) 75 MG EC tablet Take 1 tablet by mouth twice daily 180 tablet 1  ? diclofenac Sodium (VOLTAREN) 1 % GEL Apply 2 g topically 4 (four) times daily. 100 g 3  ? FLUoxetine (PROZAC) 20 MG capsule Take 1 capsule by mouth once daily in the morning 90 capsule 1  ? fluticasone (FLONASE) 50 MCG/ACT nasal spray Place 2 sprays into both nostrils daily. 16 g 0  ? gabapentin (NEURONTIN) 300 MG capsule Take 1 capsule by mouth in the morning and 2 capsules in the evening. 270 capsule 1  ? hydrochlorothiazide (MICROZIDE) 12.5 MG capsule Take 1 capsule by mouth  once daily 90 capsule 1  ? ipratropium-albuterol (DUONEB) 0.5-2.5 (3) MG/3ML SOLN Inhale 3 mLs into the lungs daily as needed (wheezing).     ? lovastatin (MEVACOR) 20 MG tablet TAKE 1 TABLET BY MOUTH AT BEDTIME 90 tablet 1  ? metFORMIN (GLUCOPHAGE) 1000 MG tablet TAKE 1 TABLET BY MOUTH TWICE DAILY WITH A MEAL 180 tablet 0  ? nitroGLYCERIN (NITROSTAT) 0.4 MG SL tablet Place 1 tablet (0.4 mg total) under the tongue every 5 (five) minutes as needed for chest pain. 30 tablet 12  ? Omega-3 Fatty Acids (FISH OIL) 1000 MG CAPS Take 1,000 mg by mouth every  morning.     ? OZEMPIC, 1 MG/DOSE, 4 MG/3ML SOPN INJECT 0.75 MLS (1 MG TOTAL) INTO THE SKIN ONCE A WEEK (USE ONLY ONCE LOWER DOSES ARE FINISHED) 30 mL 0  ? pantoprazole (PROTONIX) 40 MG tablet Take 1 tablet (40 mg total) by mouth daily. 90 tablet 0  ? TRESIBA FLEXTOUCH 100 UNIT/ML FlexTouch Pen INJECT 12 UNITS SUBCUTANEOUSLY ONCE DAILY 15 mL 0  ? valsartan (DIOVAN) 160 MG tablet Take 1 tablet by mouth once daily 90 tablet 1  ? verapamil (CALAN-SR) 240 MG CR tablet TAKE 1 TABLET BY MOUTH ONCE DAILY IN THE MORNING 90 tablet 1  ? ?Current Facility-Administered Medications on File Prior to Visit  ?Medication Dose Route Frequency Provider Last Rate Last Admin  ? nitroGLYCERIN (NITROSTAT) SL tablet 0.4 mg  0.4 mg Sublingual Q5 min PRN Lorrene Reid, PA-C      ? ? ?Allergies  ?Allergen Reactions  ? Iohexol Anaphylaxis  ?   Code: SOB, Desc: respiratory distress-ASM, Onset Date: [id number] ?  ? Iodinated Contrast Media Other (See Comments)  ? Jardiance [Empagliflozin] Rash  ? ? ?Review of Systems ?No fevers, chills, nausea, muscle aches, no difficulty breathing, no calf pain, no chest pain or shortness of breath. ? ? ?Physical Exam ? ?GENERAL APPEARANCE: Alert, conversant. Appropriately groomed. No acute distress.  ? ?VASCULAR: Pedal pulses palpable DP and PT bilateral.  Capillary refill time is immediate to all digits,  Proximal to distal cooling it warm to warm.  Digital perfusion adequate.  ? ?NEUROLOGIC: sensation is intact to 5.07 monofilament at 5/5 sites bilateral.  Light touch is intact bilateral, vibratory sensation intact bilateral ? ?Neuroma type symptomatology is elicited submetatarsal 3/4 of the left foot.  A palpable click is identified.  Pain with medial to lateral compression is also noted in this area.  ? ?MUSCULOSKELETAL: acceptable muscle strength, tone and stability bilateral.  No gross boney pedal deformities noted.  No pain, crepitus or limitation noted with foot and ankle range of motion bilateral.   ? ?DERMATOLOGIC: skin is warm, supple, and dry.  Color, texture, and turgor of skin within normal limits.  No open wounds are noted.  No preulcerative lesions are seen.  Digital nails are asymptomatic.   ? ?Xrays:  3 views of the left foot are obtained.  No sign of fracture or dislocation noted.  Mild metatarsus adductus is noted. Mildly enlarged navicular tuberosity noted. Small tibial sesamoid in comparison with fibular sesamoid noted.  No acute osseous abnormalities are present.   ? ?Assessment  ? ?  ICD-10-CM   ?1. 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  ? ? ? ?Plan ? ?Discussed exam and xray finding.  I discussed the etiology and pathology of a neuroma and recommended a steroid injection as a therapeutic and diagnostic modality.  The patient agreed and an injection of 20m  Kenalog with 0.5% marcaine plain was infiltrated into the third interspace of the left foot under sterile technique and without complication. She tolerated the injection well and was given instructions for aftercare.  She will be seen back in 4-6 weeks for a recheck and will call sooner if any questions or concerns arise.  ?

## 2022-03-07 ENCOUNTER — Encounter: Payer: Self-pay | Admitting: Physician Assistant

## 2022-03-07 ENCOUNTER — Telehealth: Payer: Self-pay | Admitting: Cardiovascular Disease

## 2022-03-07 DIAGNOSIS — R0600 Dyspnea, unspecified: Secondary | ICD-10-CM

## 2022-03-07 NOTE — Telephone Encounter (Signed)
Pt c/o Shortness Of Breath: STAT if SOB developed within the last 24 hours or pt is noticeably SOB on the phone ? ?1. Are you currently SOB (can you hear that pt is SOB on the phone)? No ? ?2. How long have you been experiencing SOB? Few months ? ?3. Are you SOB when sitting or when up moving around? Walking around ? ?4. Are you currently experiencing any other symptoms? Tired and sleep a lot ? ?Pt states that she can't walk for a long period of time without having to sit down due to SOB and HR goes up ? ?

## 2022-03-07 NOTE — Telephone Encounter (Signed)
Outreach made to Pt. ? ?Overall the last few months she has noticed increased dyspnea on exertion and increased fatigue. ? ?She states she went to [place]. [place] and she became short of breath walking around and had to stop often to rest.  She was tired after that and she noticed that she had swelling in her ankles. ? ?She states she has also noticed a rapid heart rate from time to time. ? ?She states she checks her BP and pulse regularly and those numbers have been WNL. ? ?Advised would check with Dr. Acie Fredrickson if he wanted to order any tests prior to her appt with him on 03/27/2022. ? ?Pt in agreement. ?

## 2022-03-08 NOTE — Telephone Encounter (Signed)
She has a hx of LBBB  ?Last echo was on 2020  ?Lets update her echo to make sure her dyspnea isnt due to CHF  ?Make sure she is avoid salt and salty foods   ?  ?PN  ? ?

## 2022-03-08 NOTE — Telephone Encounter (Signed)
Pt aware of recommendations and echo ordered Pt knows to [place] call in the next day or so to schedule echo ./c ?

## 2022-03-09 DIAGNOSIS — H7201 Central perforation of tympanic membrane, right ear: Secondary | ICD-10-CM | POA: Diagnosis not present

## 2022-03-09 DIAGNOSIS — J301 Allergic rhinitis due to pollen: Secondary | ICD-10-CM | POA: Diagnosis not present

## 2022-03-09 DIAGNOSIS — K1123 Chronic sialoadenitis: Secondary | ICD-10-CM | POA: Diagnosis not present

## 2022-03-09 DIAGNOSIS — H6983 Other specified disorders of Eustachian tube, bilateral: Secondary | ICD-10-CM | POA: Diagnosis not present

## 2022-03-10 ENCOUNTER — Ambulatory Visit: Payer: Medicare HMO

## 2022-03-14 ENCOUNTER — Ambulatory Visit (INDEPENDENT_AMBULATORY_CARE_PROVIDER_SITE_OTHER): Payer: Medicare HMO | Admitting: Physician Assistant

## 2022-03-14 ENCOUNTER — Encounter: Payer: Self-pay | Admitting: Physician Assistant

## 2022-03-14 VITALS — BP 116/69 | HR 74 | Temp 97.6°F | Ht 70.0 in | Wt 221.0 lb

## 2022-03-14 DIAGNOSIS — M5416 Radiculopathy, lumbar region: Secondary | ICD-10-CM

## 2022-03-14 DIAGNOSIS — M545 Low back pain, unspecified: Secondary | ICD-10-CM | POA: Diagnosis not present

## 2022-03-14 DIAGNOSIS — W19XXXA Unspecified fall, initial encounter: Secondary | ICD-10-CM

## 2022-03-14 MED ORDER — KETOROLAC TROMETHAMINE 30 MG/ML IJ SOLN
30.0000 mg | Freq: Once | INTRAMUSCULAR | Status: AC
  Administered 2022-03-14: 30 mg via INTRAMUSCULAR

## 2022-03-14 MED ORDER — PREDNISONE 20 MG PO TABS: ORAL_TABLET | ORAL | 0 refills | Status: DC

## 2022-03-14 MED ORDER — METHOCARBAMOL 500 MG PO TABS: 500.0000 mg | ORAL_TABLET | Freq: Three times a day (TID) | ORAL | 0 refills | Status: DC | PRN

## 2022-03-14 MED ORDER — METHYLPREDNISOLONE SODIUM SUCC 125 MG IJ SOLR
125.0000 mg | Freq: Once | INTRAMUSCULAR | Status: AC
  Administered 2022-03-14: 125 mg via INTRAMUSCULAR

## 2022-03-14 NOTE — Progress Notes (Signed)
?Established patient acute visit ? ? ?Patient: Samantha [name]   DOB: [birth year]   66 y.o. Female  MRN: [id number] ?Visit Date: 03/14/2022 ? ?Chief Complaint  ?Patient presents with  ? Acute Visit  ? Back Pain  ? ?Subjective  ?  ?HPI  ?Patient presents with c/o low back pain, both hips and bilateral shoulders hurt as well. Reports having numbness and tingling sensation of both lower legs. Symptoms started after she fell backwards from the lawnmower on 03/02/22 and landed on her bottom. Denies fever, bowel or bladder incontinence. Reports has tried Tylenol, Ibuprofen and heating pad with minimal relief.  ? ? ? ?Medications: ?Outpatient Medications Prior to Visit  ?Medication Sig  ? aspirin 81 MG tablet Take 81 mg by mouth every morning.   ? azelastine (ASTELIN) 0.1 % nasal spray Place 1 spray into both nostrils daily as needed for allergies.   ? budesonide-formoterol (SYMBICORT) 80-4.5 MCG/ACT inhaler Inhale 2 puffs into the lungs 2 (two) times daily.  ? Calcium Citrate-Vitamin D (CALCIUM + D PO) Take 1 tablet by mouth every morning.   ? cetirizine (ZYRTEC) 10 MG tablet Take 10 mg by mouth daily.  ? diclofenac Sodium (VOLTAREN) 1 % GEL Apply 2 g topically 4 (four) times daily.  ? FLUoxetine (PROZAC) 20 MG capsule Take 1 capsule by mouth once daily in the morning  ? fluticasone (FLONASE) 50 MCG/ACT nasal spray Place 2 sprays into both nostrils daily.  ? gabapentin (NEURONTIN) 300 MG capsule Take 1 capsule by mouth in the morning and 2 capsules in the evening.  ? hydrochlorothiazide (MICROZIDE) 12.5 MG capsule Take 1 capsule by mouth once daily  ? ipratropium-albuterol (DUONEB) 0.5-2.5 (3) MG/3ML SOLN Inhale 3 mLs into the lungs daily as needed (wheezing).   ? lovastatin (MEVACOR) 20 MG tablet TAKE 1 TABLET BY MOUTH AT BEDTIME  ? metFORMIN (GLUCOPHAGE) 1000 MG tablet TAKE 1 TABLET BY MOUTH TWICE DAILY WITH A MEAL  ? nitroGLYCERIN (NITROSTAT) 0.4 MG SL tablet Place 1 tablet (0.4 mg total) under the tongue every 5  (five) minutes as needed for chest pain.  ? Omega-3 Fatty Acids (FISH OIL) 1000 MG CAPS Take 1,000 mg by mouth every morning.   ? OZEMPIC, 1 MG/DOSE, 4 MG/3ML SOPN INJECT 0.75 MLS (1 MG TOTAL) INTO THE SKIN ONCE A WEEK (USE ONLY ONCE LOWER DOSES ARE FINISHED)  ? pantoprazole (PROTONIX) 40 MG tablet Take 1 tablet (40 mg total) by mouth daily.  ? TRESIBA FLEXTOUCH 100 UNIT/ML FlexTouch Pen INJECT 12 UNITS SUBCUTANEOUSLY ONCE DAILY  ? valsartan (DIOVAN) 160 MG tablet Take 1 tablet by mouth once daily  ? verapamil (CALAN-SR) 240 MG CR tablet TAKE 1 TABLET BY MOUTH ONCE DAILY IN THE MORNING  ? [DISCONTINUED] diclofenac (VOLTAREN) 75 MG EC tablet Take 1 tablet by mouth twice daily  ? ?Facility-Administered Medications Prior to Visit  ?Medication Dose Route Frequency Provider  ? nitroGLYCERIN (NITROSTAT) SL tablet 0.4 mg  0.4 mg Sublingual Q5 min PRN Lorrene Reid, PA-C  ? ? ?Review of Systems ?Review of Systems:  ?A fourteen system review of systems was performed and found to be positive as per HPI. ? ? ?  Objective  ?  ?BP 116/69   Pulse 74   Temp 97.6 ?F (36.4 ?C)   Ht '5\' 10"'$  (1.778 m)   Wt 221 lb (100.2 kg)   SpO2 96%   BMI 31.71 kg/m?  ? ? ?Physical Exam  ?General:  Well Developed, well nourished, appropriate for stated age.  ?  Neuro:  Alert and oriented,  extra-ocular muscles intact  ?HEENT:  Normocephalic, atraumatic, neck supple  ?Skin:  no gross rash, warm, pink. ?Cardiac:  RRR, S1 S2 ?Respiratory: CTA B/L  ?MSK: tenderness of trapezoid region, c-spine and bilateral lower back, overall limited spinal ROM due to pain, no step-off noted ?Vascular:  Ext warm, no cyanosis apprec.; cap RF less 2 sec. ?Psych:  No HI/SI, judgement and insight good, Euthymic mood. Full Affect. ? ? ?No results found for any visits on 03/14/22. ? Assessment & Plan  ?  ? ?Discussed with patient likely muscle strain for upper extremity pain but given mechanics of her fall suspicious for possible lumbar injury such as disc herniation  since she is having significant lumbar radiculopathy symptoms. Will administer ketorolac 30 mg in office and solu-medrol 125 mg to provide pain relief. Advised to start steroid taper tomorrow and take muscle relaxer as needed, recommend cautious use due to possible drowsiness. Patient is on Gabapentin which I recommend she continues. Will reassess symptoms and medication therapy in 1 weeks. Advised if symptoms fail to improve or worsen then recommend further evaluation with imaging studies such as x-ray. Pt verbalized understanding. ? ? ?Return in 1 week (on 03/21/2022), or if symptoms worsen or fail to improve, for back pain.  ?   ? ? ? ?Lorrene Reid, PA-C  ?[facility] ?[phone] (phone) ?[phone] (fax) ? ?[facility] ?

## 2022-03-14 NOTE — Patient Instructions (Signed)
Acute Back Pain, Adult Acute back pain is sudden and usually short-lived. It is often caused by an injury to the muscles and tissues in the back. The injury may result from: A muscle, tendon, or ligament getting overstretched or torn. Ligaments are tissues that connect bones to each other. Lifting something improperly can cause a back strain. Wear and tear (degeneration) of the spinal disks. Spinal disks are circular tissue that provide cushioning between the bones of the spine (vertebrae). Twisting motions, such as while playing sports or doing yard work. A hit to the back. Arthritis. You may have a physical exam, lab tests, and imaging tests to find the cause of your pain. Acute back pain usually goes away with rest and home care. Follow these instructions at home: Managing pain, stiffness, and swelling Take over-the-counter and prescription medicines only as told by your health care provider. Treatment may include medicines for pain and inflammation that are taken by mouth or applied to the skin, or muscle relaxants. Your health care provider may recommend applying ice during the first 24-48 hours after your pain starts. To do this: Put ice in a plastic bag. Place a towel between your skin and the bag. Leave the ice on for 20 minutes, 2-3 times a day. Remove the ice if your skin turns bright red. This is very important. If you cannot feel pain, heat, or cold, you have a greater risk of damage to the area. If directed, apply heat to the affected area as often as told by your health care provider. Use the heat source that your health care provider recommends, such as a moist heat pack or a heating pad. Place a towel between your skin and the heat source. Leave the heat on for 20-30 minutes. Remove the heat if your skin turns bright red. This is especially important if you are unable to feel pain, heat, or cold. You have a greater risk of getting burned. Activity  Do not stay in bed. Staying in  bed for more than 1-2 days can delay your recovery. Sit up and stand up straight. Avoid leaning forward when you sit or hunching over when you stand. If you work at a desk, sit close to it so you do not need to lean over. Keep your chin tucked in. Keep your neck drawn back, and keep your elbows bent at a 90-degree angle (right angle). Sit high and close to the steering wheel when you drive. Add lower back (lumbar) support to your car seat, if needed. Take short walks on even surfaces as soon as you are able. Try to increase the length of time you walk each day. Do not sit, drive, or stand in one place for more than 30 minutes at a time. Sitting or standing for long periods of time can put stress on your back. Do not drive or use heavy machinery while taking prescription pain medicine. Use proper lifting techniques. When you bend and lift, use positions that put less stress on your back: Bend your knees. Keep the load close to your body. Avoid twisting. Exercise regularly as told by your health care provider. Exercising helps your back heal faster and helps prevent back injuries by keeping muscles strong and flexible. Work with a physical therapist to make a safe exercise program, as recommended by your health care provider. Do any exercises as told by your physical therapist. Lifestyle Maintain a healthy weight. Extra weight puts stress on your back and makes it difficult to have good   posture. Avoid activities or situations that make you feel anxious or stressed. Stress and anxiety increase muscle tension and can make back pain worse. Learn ways to manage anxiety and stress, such as through exercise. General instructions Sleep on a firm mattress in a comfortable position. Try lying on your side with your knees slightly bent. If you lie on your back, put a pillow under your knees. Keep your head and neck in a straight line with your spine (neutral position) when using electronic equipment like  smartphones or pads. To do this: Raise your smartphone or pad to look at it instead of bending your head or neck to look down. Put the smartphone or pad at the level of your face while looking at the screen. Follow your treatment plan as told by your health care provider. This may include: Cognitive or behavioral therapy. Acupuncture or massage therapy. Meditation or yoga. Contact a health care provider if: You have pain that is not relieved with rest or medicine. You have increasing pain going down into your legs or buttocks. Your pain does not improve after 2 weeks. You have pain at night. You lose weight without trying. You have a fever or chills. You develop nausea or vomiting. You develop abdominal pain. Get help right away if: You develop new bowel or bladder control problems. You have unusual weakness or numbness in your arms or legs. You feel faint. These symptoms may represent a serious problem that is an emergency. Do not wait to see if the symptoms will go away. Get medical help right away. Call your local emergency services (911 in the [place] Do not drive yourself to the hospital. Summary Acute back pain is sudden and usually short-lived. Use proper lifting techniques. When you bend and lift, use positions that put less stress on your back. Take over-the-counter and prescription medicines only as told by your health care provider, and apply heat or ice as told. This information is not intended to replace advice given to you by your health care provider. Make sure you discuss any questions you have with your health care provider. Document Revised: 02/04/2021 Document Reviewed: 02/04/2021 Elsevier Patient Education  2023 [employer]  

## 2022-03-21 ENCOUNTER — Encounter: Payer: Self-pay | Admitting: Physician Assistant

## 2022-03-21 ENCOUNTER — Ambulatory Visit (INDEPENDENT_AMBULATORY_CARE_PROVIDER_SITE_OTHER): Payer: Medicare HMO | Admitting: Physician Assistant

## 2022-03-21 VITALS — BP 107/57 | HR 78 | Ht 70.0 in | Wt 221.0 lb

## 2022-03-21 DIAGNOSIS — M545 Low back pain, unspecified: Secondary | ICD-10-CM | POA: Diagnosis not present

## 2022-03-21 NOTE — Progress Notes (Signed)
? ?Established Patient Office Visit ? ?Subjective   ?Patient ID: Samantha [name], female    DOB: [birth year]  Age: 66 y.o. MRN: [id number] ? ?Chief Complaint  ?Patient presents with  ? Follow-up  ? Back Pain  ? ? ?HPI ?Patient presents for follow-up on low back pain. Patient reports completed steroid taper. Has not needed to take muscle relaxer which did not cause drowsiness. Reports feeling better. Back pain has improved and able to resume normal activities. In the mornings has some joint stiffness which improves as she gets moving.  ? ?Patient Active Problem List  ? Diagnosis Date Noted  ? Left foot pain 02/20/2022  ? Neuroma of foot 02/20/2022  ? Unstable angina (HCC)   ? Arthritis 01/14/2021  ? Bowel incontinence 01/14/2021  ? ETD (eustachian tube dysfunction) 01/14/2021  ? Left lower quadrant pain 01/14/2021  ? Personal history of colonic polyps 01/14/2021  ? Screening for malignant neoplasm of colon 01/14/2021  ? Hyperlipidemia associated with type 2 diabetes mellitus (HCC) 02/23/2020  ? 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 Type 2 diabetes mellitus with morbid obesity (HCC) 02/23/2020  ? B12 deficiency 02/23/2020  ? Vitamin D deficiency 02/23/2020  ? Family history of heart disease 02/23/2020  ? Family history of diabetes mellitus (DM) 02/23/2020  ? Family history of colon cancer-grandmother 02/23/2020  ? Family history of mixed hyperlipidemia 02/23/2020  ? Family history of hypertension 02/23/2020  ? Mood disorder (HCC) 02/23/2020  ? Degenerative disc disease, lumbar 07/19/2018  ? 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  ? Spondylosis of lumbar spine 07/19/2018  ? Lumbar radiculopathy 07/05/2018  ? 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 Eustachian tube dysfunction, bilateral 02/21/2018  ? Recurrent sinusitis 02/21/2018  ? Tympanic membrane retraction, left 02/21/2018  ? GERD (gastroesophageal reflux disease)  10/24/2017  ? Cough 08/14/2017  ? Dyspnea 08/14/2017  ? Pulmonary nodule, right 08/14/2017  ? Back pain 07/12/2017  ? Obesity (BMI 30-39.9) 06/11/2017  ? LBBB (left bundle branch block) 08/10/2016  ? Leukocytosis 08/10/2016  ? Hypertension associated with diabetes (HCC) 08/10/2016  ? Pain in the chest 08/09/2016  ? Diabetes (HCC) 11/20/2015  ? ?Past Medical History:  ?Diagnosis Date  ? Arthritis   ? Dyslipidemia   ? Heart disease   ? Heart murmur 2011  ? Left bundle branch block 2011  ? Type II or unspecified type diabetes mellitus without mention of complication, not stated as uncontrolled   ? Unspecified essential hypertension   ? ?Past Surgical History:  ?Procedure Laterality Date  ? LEFT HEART CATH AND CORONARY ANGIOGRAPHY N/A 06/30/2021  ? Procedure: LEFT HEART CATH AND CORONARY ANGIOGRAPHY;  Surgeon: Lennette Bihari, MD;  Location: [facility] INVASIVE CV LAB;  Service: Cardiovascular;  Laterality: N/A;  ? REPLACEMENT TOTAL KNEE Left 2017  ? TONSILLECTOMY  1962  ? ?Social History  ? ?Tobacco Use  ? Smoking status: Never  ? Smokeless tobacco: Never  ?Vaping Use  ? Vaping Use: Never used  ?Substance Use Topics  ? Alcohol use: No  ? Drug use: No  ? ?Allergies  ?Allergen Reactions  ? Iohexol Anaphylaxis  ?   Code: SOB, Desc: respiratory distress-ASM, Onset Date: [id number] ?  ? Iodinated Contrast Media Other (See Comments)  ? Jardiance [Empagliflozin] Rash  ? ?  ? ?ROS ? ?  ?Objective:  ?  ? ?BP (!) 107/57   Pulse 78   Ht 5\' 10"  (1.778 m)  Wt 221 lb (100.2 kg)   SpO2 96%   BMI 31.71 kg/m?  ?BP Readings from Last 3 Encounters:  ?03/21/22 (!) 107/57  ?03/14/22 116/69  ?02/22/22 113/62  ? ?Wt Readings from Last 3 Encounters:  ?03/21/22 221 lb (100.2 kg)  ?03/14/22 221 lb (100.2 kg)  ?02/22/22 221 lb (100.2 kg)  ? ? ?Physical Exam ?General:  Well Developed, well nourished, appropriate for stated age.  ?Neuro:  Alert and oriented,  extra-ocular muscles intact  ?HEENT:  Normocephalic, atraumatic, neck supple  ?Skin:  no gross  rash, warm, pink. ?Cardiac:  RRR, S1 S2 ?Respiratory: CTA B/L  ?Vascular:  Ext warm, no cyanosis apprec.; cap RF less 2 sec. ?Psych:  No HI/SI, judgement and insight good, Euthymic mood. Full Affect. ? ? ?No results found for any visits on 03/21/22. ? ?Last CBC ?Lab Results  ?Component Value Date  ? WBC 9.1 09/13/2021  ? HGB 11.8 09/13/2021  ? HCT 35.0 09/13/2021  ? MCV 85 09/13/2021  ? MCH 28.6 09/13/2021  ? RDW 11.9 09/13/2021  ? PLT 339 09/13/2021  ? ?Last metabolic panel ?Lab Results  ?Component Value Date  ? GLUCOSE 175 (H) 02/22/2022  ? NA 140 02/22/2022  ? K 5.0 02/22/2022  ? CL 100 02/22/2022  ? CO2 21 02/22/2022  ? BUN 25 02/22/2022  ? CREATININE 1.27 (H) 02/22/2022  ? EGFR 46 (L) 02/22/2022  ? CALCIUM 9.3 02/22/2022  ? PHOS 3.2 05/12/2010  ? PROT 6.9 02/22/2022  ? ALBUMIN 4.2 02/22/2022  ? LABGLOB 2.7 02/22/2022  ? AGRATIO 1.6 02/22/2022  ? BILITOT 0.3 02/22/2022  ? ALKPHOS 74 02/22/2022  ? AST 16 02/22/2022  ? ALT 18 02/22/2022  ? ANIONGAP 10 08/01/2021  ? ?Last lipids ?Lab Results  ?Component Value Date  ? CHOL 144 09/13/2021  ? HDL 55 09/13/2021  ? [place] 62 09/13/2021  ? TRIG 157 (H) 09/13/2021  ? CHOLHDL 2.6 09/13/2021  ? ?Last hemoglobin A1c ?Lab Results  ?Component Value Date  ? HGBA1C 6.6 (A) 02/22/2022  ? ?Last thyroid functions ?Lab Results  ?Component Value Date  ? TSH 4.030 09/13/2021  ? ?Last vitamin D ?Lab Results  ?Component Value Date  ? VD25OH 36.2 09/13/2021  ? ?  ? ?The 10-year ASCVD risk score (Arnett DK, et al., 2019) is: 19.8% ? ?  ?Assessment & Plan:  ? ?Problem List Items Addressed This Visit   ? ?  ? Other  ? Back pain - Primary  ? ?Back pain has improved. Recommend to continue with muscle relaxer (methocarbamol) as needed. Resume daily activities as tolerated.  ? ?Return if symptoms worsen or fail to improve.  ? ? ?Lorrene Reid, PA-C ? ?

## 2022-03-22 ENCOUNTER — Ambulatory Visit (HOSPITAL_COMMUNITY): Payer: Medicare HMO | Attending: Cardiology

## 2022-03-22 DIAGNOSIS — R0609 Other forms of dyspnea: Secondary | ICD-10-CM

## 2022-03-22 DIAGNOSIS — R0600 Dyspnea, unspecified: Secondary | ICD-10-CM | POA: Insufficient documentation

## 2022-03-22 LAB — ECHOCARDIOGRAM COMPLETE
Area-P 1/2: 3.5 cm2
MV M vel: 4.71 m/s
MV Peak grad: 88.7 mmHg
P 1/2 time: 552 msec
Radius: 0.5 cm
S' Lateral: 3.2 cm

## 2022-03-23 ENCOUNTER — Telehealth: Payer: Self-pay

## 2022-03-23 ENCOUNTER — Other Ambulatory Visit: Payer: Self-pay | Admitting: Cardiovascular Disease

## 2022-03-23 DIAGNOSIS — Z79899 Other long term (current) drug therapy: Secondary | ICD-10-CM

## 2022-03-23 MED ORDER — POTASSIUM CHLORIDE ER 10 MEQ PO TBCR: 10.0000 meq | EXTENDED_RELEASE_TABLET | Freq: Every day | ORAL | 3 refills | Status: DC

## 2022-03-23 MED ORDER — FUROSEMIDE 40 MG PO TABS: 40.0000 mg | ORAL_TABLET | Freq: Every day | ORAL | 3 refills | Status: DC

## 2022-03-23 NOTE — Telephone Encounter (Signed)
Spoke directly with patient and reviewed results and recommendations. Pt understands to stop HCTZ and will begin lasix and Potassium daily. She states she has been trying to cut down on her salt intake and will continue. BMET scheduled for 5/1 with next appt. ?

## 2022-03-23 NOTE — Telephone Encounter (Signed)
-----   Message from Thayer Headings, MD sent at 03/22/2022 11:43 AM EDT ----- ?Normal left ventricular systolic function.  She has grade 1 diastolic dysfunction ?Mild mitral regurgitation mild aortic insufficiency ? ?Minimal valvular disease, ?Her dyspnea may be due to her mild diastolic CHF.  ?Make sure she is avoiding salty foods.  ?DC HCTZ,  ?Start Lasix 40 mg a day  ?Add Kdur 10 meq a day  ?Keep appt  May 1 , add BMP to her office visit on May 1  ?

## 2022-03-23 NOTE — Progress Notes (Signed)
Stop HCTZ per Nahser, start lasix and kdur ?

## 2022-03-27 ENCOUNTER — Ambulatory Visit: Payer: Medicare HMO | Admitting: Cardiovascular Disease

## 2022-03-27 ENCOUNTER — Encounter: Payer: Self-pay | Admitting: Cardiovascular Disease

## 2022-03-27 ENCOUNTER — Other Ambulatory Visit: Payer: Medicare HMO | Admitting: *Deleted

## 2022-03-27 ENCOUNTER — Ambulatory Visit (INDEPENDENT_AMBULATORY_CARE_PROVIDER_SITE_OTHER)
Admission: RE | Admit: 2022-03-27 | Discharge: 2022-03-27 | Disposition: A | Discharge status: Home / Self Care | Payer: Medicare HMO | Source: Ambulatory Visit | Attending: Cardiovascular Disease | Admitting: Cardiovascular Disease

## 2022-03-27 ENCOUNTER — Ambulatory Visit: Payer: Medicare HMO | Admitting: Podiatrist

## 2022-03-27 VITALS — BP 114/68 | HR 71 | Ht 70.0 in | Wt 218.0 lb

## 2022-03-27 DIAGNOSIS — E1159 Type 2 diabetes mellitus with other circulatory complications: Secondary | ICD-10-CM | POA: Diagnosis not present

## 2022-03-27 DIAGNOSIS — S0990XA Unspecified injury of head, initial encounter: Secondary | ICD-10-CM

## 2022-03-27 DIAGNOSIS — I152 Hypertension secondary to endocrine disorders: Secondary | ICD-10-CM | POA: Diagnosis not present

## 2022-03-27 DIAGNOSIS — I447 Left bundle-branch block, unspecified: Secondary | ICD-10-CM

## 2022-03-27 DIAGNOSIS — R531 Weakness: Secondary | ICD-10-CM | POA: Diagnosis not present

## 2022-03-27 NOTE — Patient Instructions (Signed)
Medication Instructions:  ?Your physician has recommended you make the following change in your medication:  ? ?STOP: Furosemide (lasix) ?STOP: Potassium ? ?*If you need a refill on your cardiac medications before your next appointment, please call your pharmacy* ? ? ?Lab Work: ?TODAY: BMET, CBC ? ?If you have labs (blood work) drawn today and your tests are completely normal, you will receive your results only by: ?MyChart Message (if you have MyChart) OR ?A paper copy in the mail ?If you have any lab test that is abnormal or we need to change your treatment, we will call you to review the results. ? ? ?Testing/Procedures: ?CT Head without contrast ? ?Follow-Up: ?At [facility], you and your health needs are our priority.  As part of our continuing mission to provide you with exceptional heart care, we have created designated Provider Care Teams.  These Care Teams include your primary Cardiologist (physician) and Advanced Practice Providers (APPs -  Physician Assistants and Nurse Practitioners) who all work together to provide you with the care you need, when you need it. ? ? ?Your next appointment:   ?1 year(s) ? ?The format for your next appointment:   ?In Person ? ?Provider:   ?Mertie Moores, MD   ? ?

## 2022-03-27 NOTE — Progress Notes (Signed)
?Cardiology Office Note:   ? ?Date:  03/27/2022  ? ?ID:  Samantha [name], DOB 17-Apr-1956, MRN [id number] ? ?PCP:  Lorrene Reid, PA-C  ?Cardiologist:  Norvella Loscalzo  ?Electrophysiologist:  None  ? ?Referring MD: Lorrene Reid, PA-C  ? ?Chief Complaint  ?Patient presents with  ? Chest Pain  ? ? ?Prior notes:  ? ?[name] is a 66 y.o. female with a hx of diabetes mellitus, obesity, hypertension, left bundle branch block.  We are asked to see her today by Tobie Lords, FNP  for further evaluation of her shortness of breath. ? ?I have seen her in the past.  ?Has lots of GI issues after eating .  ?She has lots of pain and discomfort especially after eating.  She is quite active at work.  She is never had episodes of chest pain with walking around. ? ?Exercises at work she works in a [employer].  She does have some ankle swelling. ?Avoids salt.  ? ?Does not walk for exercise ? ?We performed a heart cath in 2006 which was essentially normal.  I saw her back in 2017 for left bundle branch block.  Echocardiogram at that time revealed normal left ventricular function. ? ?Has cp related to eating.   ? ?Aug. 3, 2022: ?Samantha [name] is seen today for follow up of her chest pain .  Chest pain seems to be related to eating ?She is having lots of nausea, vomitting and dizziness ? ?Has a LBBB ? ?Last Friday , she was mopping the floor. ?Got sweaty, lightheaded ?Intrascapular pain  and chest pain  ?Happened again on Saturday  ?Doe not get any regular exercise ? ?She is allergic to IV contrast - will need pre-meds for any imaging procedure like cath or CTA ? ? Mother and sister had CABG in their 21s  ?Brother died at 75 due to CAD  ?Father had CAD in his 68s  ?Had a myoview in 2011 ?Showed an anterior defect - ? Breast artifact. Vs. MI  ? ?Setp. 12, 2022 ?Samantha [name] is seen today for follow up of her CP ?Cath on Aug. 4, 2022 showed normal coronaries. ?Has been found to have a pinched nerve in her neck.   This is likely to be the  cause of her left arm pain  ? ?Mar 27, 2022: ?Samantha [name] is seen for follow-up of her chest pain.  She has a very strong family history of coronary artery disease.  She had a heart catheterization in August, 2022 which showed normal coronary arteries. ? ?Echocardiogram from March 22, 2022 reveals normal left ventricular systolic function.  She has grade 1 diastolic dysfunction with mild mitral regurgitation and mild aortic insufficiency. ? ?We started her on Lasix 40 mg a day and Kdur 10 meq a day ? ?No pleuretic CP ,  ?Not tachycardic ?No symptoms suggestive of pulmonary embolus ? ?[place] backwards off a lawnmower  April 6  ?Has not felt well since then  ?Hit her head,  has a head tremor since the accident  ?No double vision  ?Speech has not been right  ? ? ?Past Medical History:  ?Diagnosis Date  ? Arthritis   ? Dyslipidemia   ? Heart disease   ? Heart murmur 2011  ? Left bundle branch block 2011  ? Type II or unspecified type diabetes mellitus without mention of complication, not stated as uncontrolled   ? Unspecified essential hypertension   ? ? ?Past Surgical History:  ?Procedure Laterality Date  ? LEFT HEART CATH AND CORONARY  ANGIOGRAPHY N/A 06/30/2021  ? Procedure: LEFT HEART CATH AND CORONARY ANGIOGRAPHY;  Surgeon: Troy Sine, MD;  Location: [place] [facility];  Service: Cardiovascular;  Laterality: N/A;  ? REPLACEMENT TOTAL KNEE Left 2017  ? TONSILLECTOMY  1962  ? ? ?Current Medications: ?Current Meds  ?Medication Sig  ? aspirin 81 MG tablet Take 81 mg by mouth every morning.   ? azelastine (ASTELIN) 0.1 % nasal spray Place 1 spray into both nostrils daily as needed for allergies.   ? budesonide-formoterol (SYMBICORT) 80-4.5 MCG/ACT inhaler Inhale 2 puffs into the lungs 2 (two) times daily.  ? Calcium Citrate-Vitamin D (CALCIUM + D PO) Take 1 tablet by mouth every morning.   ? cetirizine (ZYRTEC) 10 MG tablet Take 10 mg by mouth daily.  ? diclofenac Sodium (VOLTAREN) 1 % GEL Apply 2 g topically 4 (four)  times daily.  ? FLUoxetine (PROZAC) 20 MG capsule Take 1 capsule by mouth once daily in the morning  ? fluticasone (FLONASE) 50 MCG/ACT nasal spray Place 2 sprays into both nostrils daily.  ? gabapentin (NEURONTIN) 300 MG capsule Take 1 capsule by mouth in the morning and 2 capsules in the evening.  ? ipratropium-albuterol (DUONEB) 0.5-2.5 (3) MG/3ML SOLN Inhale 3 mLs into the lungs daily as needed (wheezing).   ? lovastatin (MEVACOR) 20 MG tablet TAKE 1 TABLET BY MOUTH AT BEDTIME  ? metFORMIN (GLUCOPHAGE) 1000 MG tablet TAKE 1 TABLET BY MOUTH TWICE DAILY WITH A MEAL  ? methocarbamol (ROBAXIN) 500 MG tablet Take 1 tablet (500 mg total) by mouth every 8 (eight) hours as needed for muscle spasms.  ? nitroGLYCERIN (NITROSTAT) 0.4 MG SL tablet Place 1 tablet (0.4 mg total) under the tongue every 5 (five) minutes as needed for chest pain.  ? Omega-3 Fatty Acids (FISH OIL) 1000 MG CAPS Take 1,000 mg by mouth every morning.   ? OZEMPIC, 1 MG/DOSE, 4 MG/3ML SOPN INJECT 0.75 MLS (1 MG TOTAL) INTO THE SKIN ONCE A WEEK (USE ONLY ONCE LOWER DOSES ARE FINISHED)  ? pantoprazole (PROTONIX) 40 MG tablet Take 1 tablet (40 mg total) by mouth daily.  ? TRESIBA FLEXTOUCH 100 UNIT/ML FlexTouch Pen INJECT 12 UNITS SUBCUTANEOUSLY ONCE DAILY  ? valsartan (DIOVAN) 160 MG tablet Take 1 tablet by mouth once daily  ? verapamil (CALAN-SR) 240 MG CR tablet TAKE 1 TABLET BY MOUTH ONCE DAILY IN THE MORNING  ? [DISCONTINUED] furosemide (LASIX) 40 MG tablet Take 1 tablet (40 mg total) by mouth daily.  ? [DISCONTINUED] potassium chloride (KLOR-CON) 10 MEQ tablet Take 1 tablet (10 mEq total) by mouth daily.  ? [DISCONTINUED] predniSONE (DELTASONE) 20 MG tablet Take 2 tablets by mouth x 2 days, 1 tablets x 2 days, 0.5 tablet x 2 days  ? ?Current Facility-Administered Medications for the 03/27/22 encounter (Office Visit) with Khilynn Borntreger, Wonda Cheng, MD  ?Medication  ? nitroGLYCERIN (NITROSTAT) SL tablet 0.4 mg  ?  ? ?Allergies:   Iohexol, Iodinated contrast  media, and Jardiance [empagliflozin]  ? ?Social History  ? ?Socioeconomic History  ? Marital status: Married  ?  Spouse name: Not on file  ? Number of children: 1  ? Years of education: Not on file  ? Highest education level: Not on file  ?Occupational History  ? Not on file  ?Tobacco Use  ? Smoking status: Never  ? Smokeless tobacco: Never  ?Vaping Use  ? Vaping Use: Never used  ?Substance and Sexual Activity  ? Alcohol use: No  ? Drug use: No  ?  Sexual activity: Not Currently  ?Other Topics Concern  ? Not on file  ?Social History Narrative  ? Not on file  ? ?Social Determinants of Health  ? ?Financial Resource Strain: Low Risk   ? Difficulty of Paying Living Expenses: Not very hard  ?Food Insecurity: No Food Insecurity  ? Worried About Charity fundraiser in the Last Year: Never true  ? Ran Out of Food in the Last Year: Never true  ?Transportation Needs: No Transportation Needs  ? Lack of Transportation (Medical): No  ? Lack of Transportation (Non-Medical): No  ?Physical Activity: Insufficiently Active  ? Days of Exercise per Week: 2 days  ? Minutes of Exercise per Session: 20 min  ?Stress: No Stress Concern Present  ? Feeling of Stress : Not at all  ?Social Connections: Moderately Integrated  ? Frequency of Communication with Friends and Family: More than three times a week  ? Frequency of Social Gatherings with Friends and Family: Three times a week  ? Attends Religious Services: More than 4 times per year  ? Active Member of Clubs or Organizations: No  ? Attends Archivist Meetings: Never  ? Marital Status: Married  ?  ? ?Family History: ?The patient's family history includes CAD in her brother, father, and mother; Diabetes in her brother, father, mother, and sister; Hyperlipidemia in her brother; Hypertension in her brother, father, mother, and sister. ? ?ROS:   ?Please see the history of present illness.    ? All other systems reviewed and are negative. ? ?EKGs/Labs/Other Studies Reviewed:   ? ?The  following studies were reviewed today: ? ? ?EKG: Mar 27, 2022: Normal sinus rhythm at 71.  Left bundle branch block.  Abnormal EKG.  No changes from previous EKG. ? ? ?Recent Labs: ?09/13/2021: Hemoglobin 11.8;

## 2022-03-27 NOTE — Addendum Note (Signed)
Addended by: Carylon Perches on: 03/27/2022 04:14 PM ? ? Modules accepted: Orders ? ?

## 2022-03-28 ENCOUNTER — Ambulatory Visit: Payer: Medicare HMO | Admitting: Diagnostic Neuroimaging

## 2022-03-28 ENCOUNTER — Encounter: Payer: Self-pay | Admitting: Diagnostic Neuroimaging

## 2022-03-28 ENCOUNTER — Other Ambulatory Visit: Payer: Medicare HMO

## 2022-03-28 VITALS — BP 125/59 | HR 86 | Ht 70.0 in | Wt 218.0 lb

## 2022-03-28 DIAGNOSIS — G250 Essential tremor: Secondary | ICD-10-CM | POA: Diagnosis not present

## 2022-03-28 DIAGNOSIS — F0781 Postconcussional syndrome: Secondary | ICD-10-CM | POA: Diagnosis not present

## 2022-03-28 DIAGNOSIS — R251 Tremor, unspecified: Secondary | ICD-10-CM

## 2022-03-28 LAB — BASIC METABOLIC PANEL
BUN/Creatinine Ratio: 19 (ref 12–28)
BUN: 30 mg/dL — ABNORMAL HIGH (ref 8–27)
CO2: 25 mmol/L (ref 20–29)
Calcium: 9.4 mg/dL (ref 8.7–10.3)
Chloride: 98 mmol/L (ref 96–106)
Creatinine, Ser: 1.56 mg/dL — ABNORMAL HIGH (ref 0.57–1.00)
Glucose: 109 mg/dL — ABNORMAL HIGH (ref 70–99)
Potassium: 4.8 mmol/L (ref 3.5–5.2)
Sodium: 140 mmol/L (ref 134–144)
eGFR: 36 mL/min/{1.73_m2} — ABNORMAL LOW (ref >59)

## 2022-03-28 LAB — CBC
Hematocrit: 38.2 % (ref 34.0–46.6)
Hemoglobin: 12.7 g/dL (ref 11.1–15.9)
MCH: 28.7 pg (ref 26.6–33.0)
MCHC: 33.2 g/dL (ref 31.5–35.7)
MCV: 86 fL (ref 79–97)
Platelets: 366 10*3/uL (ref 150–450)
RBC: 4.42 x10E6/uL (ref 3.77–5.28)
RDW: 13.2 % (ref 11.7–15.4)
WBC: 15.4 10*3/uL — ABNORMAL HIGH (ref 3.4–10.8)

## 2022-03-28 NOTE — Patient Instructions (Signed)
?  POST-CONCUSSION SYNDROME (fatigue, brain fog, dizziness, headaches) ?- gradually improving; supportive care and should resolve over time ? ?POSTURAL TREMOR HEAD / HANDS ?- likely mild essential tremor (family hx of tremor in father); may have been aggravated with concussion; monitor; consider primidone in future ?

## 2022-03-28 NOTE — Progress Notes (Signed)
? ?[facility] ? ?PATIENT: Samantha [name] ?DOB: 05/06/1956 ? ?REFERRING CLINICIAN: Nahser, Wonda Cheng, MD ?HISTORY FROM: patient ?REASON FOR VISIT: new consult ? ? ?HISTORICAL ? ?CHIEF COMPLAINT:  ?Chief Complaint  ?Patient presents with  ? Tremors  ?  Rm 7 alone ?Pt is well, has been having head tremors since fall on April 6 about a week later. Recently notice slight hand shakes as well.   ? ? ?HISTORY OF PRESENT ILLNESS:  ? ?66 year old female here for evaluation of tremor. ? ?03/02/22 patient was on a riding lawnmower, finished cutting lawn when she went to park in the garage.  When she got off the lawnmower, the brake was not gauged and started rolling backwards.  Patient jumped back onto it to stop it but then was knocked off of it.  She fell down onto her bottom and hit her back and head on the ground which was a gravel driveway.  She had immediate pain throughout her body.  She rested for a moment and then was able to get up to the house.  She did not have loss of consciousness. ? ?Patient with a few days for symptoms improved but she continued to have problems.  She went to PCP and cardiology for evaluation.  She noticed brain fog, dizziness, headaches, tremor in the head and arms.  Some of the symptoms gradually improved the tremors have not.  She had CT scan of the head yesterday which was unremarkable. ? ?Of note patient's father had a similar mild tremor when he was older mainly affecting the hands.  Patient denies any prior tremor prior to 03/02/2022 events.  Of note patient did have another intention event in January 2023. ? ? ?REVIEW OF SYSTEMS: Full 14 system review of systems performed and negative with exception of: as per HPI. ? ?ALLERGIES: ?Allergies  ?Allergen Reactions  ? Iohexol Anaphylaxis  ?   Code: SOB, Desc: respiratory distress-ASM, Onset Date: [id number] ?  ? Iodinated Contrast Media Other (See Comments)  ? Jardiance [Empagliflozin] Rash  ? ? ?HOME MEDICATIONS: ?Outpatient  Medications Prior to Visit  ?Medication Sig Dispense Refill  ? aspirin 81 MG tablet Take 81 mg by mouth every morning.     ? azelastine (ASTELIN) 0.1 % nasal spray Place 1 spray into both nostrils daily as needed for allergies.     ? budesonide-formoterol (SYMBICORT) 80-4.5 MCG/ACT inhaler Inhale 2 puffs into the lungs 2 (two) times daily. 1 Inhaler 12  ? Calcium Citrate-Vitamin D (CALCIUM + D PO) Take 1 tablet by mouth every morning.     ? cetirizine (ZYRTEC) 10 MG tablet Take 10 mg by mouth daily.    ? diclofenac Sodium (VOLTAREN) 1 % GEL Apply 2 g topically 4 (four) times daily. 100 g 3  ? FLUoxetine (PROZAC) 20 MG capsule Take 1 capsule by mouth once daily in the morning 90 capsule 1  ? fluticasone (FLONASE) 50 MCG/ACT nasal spray Place 2 sprays into both nostrils daily. 16 g 0  ? gabapentin (NEURONTIN) 300 MG capsule Take 1 capsule by mouth in the morning and 2 capsules in the evening. 270 capsule 1  ? ipratropium-albuterol (DUONEB) 0.5-2.5 (3) MG/3ML SOLN Inhale 3 mLs into the lungs daily as needed (wheezing).     ? lovastatin (MEVACOR) 20 MG tablet TAKE 1 TABLET BY MOUTH AT BEDTIME 90 tablet 1  ? metFORMIN (GLUCOPHAGE) 1000 MG tablet TAKE 1 TABLET BY MOUTH TWICE DAILY WITH A MEAL 180 tablet 0  ? methocarbamol (ROBAXIN) 500  MG tablet Take 1 tablet (500 mg total) by mouth every 8 (eight) hours as needed for muscle spasms. 30 tablet 0  ? nitroGLYCERIN (NITROSTAT) 0.4 MG SL tablet Place 1 tablet (0.4 mg total) under the tongue every 5 (five) minutes as needed for chest pain. 30 tablet 12  ? Omega-3 Fatty Acids (FISH OIL) 1000 MG CAPS Take 1,000 mg by mouth every morning.     ? OZEMPIC, 1 MG/DOSE, 4 MG/3ML SOPN INJECT 0.75 MLS (1 MG TOTAL) INTO THE SKIN ONCE A WEEK (USE ONLY ONCE LOWER DOSES ARE FINISHED) 30 mL 0  ? pantoprazole (PROTONIX) 40 MG tablet Take 1 tablet (40 mg total) by mouth daily. 90 tablet 0  ? TRESIBA FLEXTOUCH 100 UNIT/ML FlexTouch Pen INJECT 12 UNITS SUBCUTANEOUSLY ONCE DAILY 15 mL 0  ?  valsartan (DIOVAN) 160 MG tablet Take 1 tablet by mouth once daily 90 tablet 1  ? verapamil (CALAN-SR) 240 MG CR tablet TAKE 1 TABLET BY MOUTH ONCE DAILY IN THE MORNING 90 tablet 1  ? ?Facility-Administered Medications Prior to Visit  ?Medication Dose Route Frequency Provider Last Rate Last Admin  ? nitroGLYCERIN (NITROSTAT) SL tablet 0.4 mg  0.4 mg Sublingual Q5 min PRN Lorrene Reid, PA-C      ? ? ?PAST MEDICAL HISTORY: ?Past Medical History:  ?Diagnosis Date  ? Arthritis   ? Dyslipidemia   ? Heart disease   ? Heart murmur 2011  ? Left bundle branch block 2011  ? Type II or unspecified type diabetes mellitus without mention of complication, not stated as uncontrolled   ? Unspecified essential hypertension   ? ? ?PAST SURGICAL HISTORY: ?Past Surgical History:  ?Procedure Laterality Date  ? LEFT HEART CATH AND CORONARY ANGIOGRAPHY N/A 06/30/2021  ? Procedure: LEFT HEART CATH AND CORONARY ANGIOGRAPHY;  Surgeon: Troy Sine, MD;  Location: [facility];  Service: Cardiovascular;  Laterality: N/A;  ? REPLACEMENT TOTAL KNEE Left 2017  ? TONSILLECTOMY  1962  ? ? ?FAMILY HISTORY: ?Family History  ?Problem Relation Age of Onset  ? Diabetes Mother   ? Hypertension Mother   ? CAD Mother   ? Hypertension Father   ? Diabetes Father   ? CAD Father   ? Diabetes Brother   ? Hyperlipidemia Brother   ? Hypertension Brother   ? CAD Brother   ? Diabetes Sister   ? Hypertension Sister   ? ? ?SOCIAL HISTORY: ?Social History  ? ?Socioeconomic History  ? Marital status: Married  ?  Spouse name: Not on file  ? Number of children: 1  ? Years of education: Not on file  ? Highest education level: Not on file  ?Occupational History  ? Not on file  ?Tobacco Use  ? Smoking status: Never  ? Smokeless tobacco: Never  ?Vaping Use  ? Vaping Use: Never used  ?Substance and Sexual Activity  ? Alcohol use: No  ? Drug use: No  ? Sexual activity: Not Currently  ?Other Topics Concern  ? Not on file  ?Social History Narrative  ? Not on file   ? ?Social Determinants of Health  ? ?Financial Resource Strain: Low Risk   ? Difficulty of Paying Living Expenses: Not very hard  ?Food Insecurity: No Food Insecurity  ? Worried About Charity fundraiser in the Last Year: Never true  ? Ran Out of Food in the Last Year: Never true  ?Transportation Needs: No Transportation Needs  ? Lack of Transportation (Medical): No  ? Lack of  Transportation (Non-Medical): No  ?Physical Activity: Insufficiently Active  ? Days of Exercise per Week: 2 days  ? Minutes of Exercise per Session: 20 min  ?Stress: No Stress Concern Present  ? Feeling of Stress : Not at all  ?Social Connections: Moderately Integrated  ? Frequency of Communication with Friends and Family: More than three times a week  ? Frequency of Social Gatherings with Friends and Family: Three times a week  ? Attends Religious Services: More than 4 times per year  ? Active Member of Clubs or Organizations: No  ? Attends Archivist Meetings: Never  ? Marital Status: Married  ?Intimate Partner Violence: Not At Risk  ? Fear of Current or Ex-Partner: No  ? Emotionally Abused: No  ? Physically Abused: No  ? Sexually Abused: No  ? ? ? ?PHYSICAL EXAM ? ?GENERAL EXAM/CONSTITUTIONAL: ?Vitals:  ?Vitals:  ? 03/28/22 0855  ?BP: (!) 125/59  ?Pulse: 86  ?Weight: 218 lb (98.9 kg)  ?Height: $RemoveB'5\' 10"'ieIRgGHW$  (1.778 m)  ? ?Body mass index is 31.28 kg/m?. ?Wt Readings from Last 3 Encounters:  ?03/28/22 218 lb (98.9 kg)  ?03/27/22 218 lb (98.9 kg)  ?03/21/22 221 lb (100.2 kg)  ? ?Patient is in no distress; well developed, nourished and groomed; neck is supple ? ?CARDIOVASCULAR: ?Examination of carotid arteries is normal; no carotid bruits ?Regular rate and rhythm, no murmurs ?Examination of peripheral vascular system by observation and palpation is normal ? ?EYES: ?Ophthalmoscopic exam of optic discs and posterior segments is normal; no papilledema or hemorrhages ?No results found. ? ?MUSCULOSKELETAL: ?Gait, strength, tone, movements noted  in Neurologic exam below ? ?NEUROLOGIC: ?MENTAL STATUS:  ?   ? View : No data to display.  ?  ?  ?  ? ?awake, alert, oriented to person, place and time ?recent and remote memory intact ?normal attention and conce

## 2022-04-10 ENCOUNTER — Other Ambulatory Visit: Payer: Medicare HMO

## 2022-04-10 DIAGNOSIS — D72829 Elevated white blood cell count, unspecified: Secondary | ICD-10-CM

## 2022-04-10 DIAGNOSIS — I447 Left bundle-branch block, unspecified: Secondary | ICD-10-CM

## 2022-04-11 ENCOUNTER — Other Ambulatory Visit: Payer: Self-pay | Admitting: Physician Assistant

## 2022-04-11 LAB — CBC
Hematocrit: 34.9 % (ref 34.0–46.6)
Hemoglobin: 11.1 g/dL (ref 11.1–15.9)
MCH: 27.8 pg (ref 26.6–33.0)
MCHC: 31.8 g/dL (ref 31.5–35.7)
MCV: 88 fL (ref 79–97)
Platelets: 322 10*3/uL (ref 150–450)
RBC: 3.99 x10E6/uL (ref 3.77–5.28)
RDW: 12.9 % (ref 11.7–15.4)
WBC: 7.8 10*3/uL (ref 3.4–10.8)

## 2022-04-12 ENCOUNTER — Encounter: Payer: Self-pay | Admitting: Physician Assistant

## 2022-04-12 ENCOUNTER — Ambulatory Visit (INDEPENDENT_AMBULATORY_CARE_PROVIDER_SITE_OTHER): Payer: Medicare HMO | Admitting: Physician Assistant

## 2022-04-12 ENCOUNTER — Telehealth: Payer: Self-pay | Admitting: Physician Assistant

## 2022-04-12 VITALS — BP 110/64 | HR 79 | Temp 97.7°F | Ht 70.0 in | Wt 221.0 lb

## 2022-04-12 DIAGNOSIS — M1990 Unspecified osteoarthritis, unspecified site: Secondary | ICD-10-CM

## 2022-04-12 DIAGNOSIS — J0140 Acute pansinusitis, unspecified: Secondary | ICD-10-CM

## 2022-04-12 MED ORDER — AZITHROMYCIN 250 MG PO TABS: ORAL_TABLET | ORAL | 0 refills | Status: AC

## 2022-04-12 MED ORDER — DICLOFENAC SODIUM 75 MG PO TBEC: 75.0000 mg | DELAYED_RELEASE_TABLET | Freq: Two times a day (BID) | ORAL | 0 refills | Status: DC

## 2022-04-12 NOTE — Patient Instructions (Signed)

## 2022-04-12 NOTE — Telephone Encounter (Signed)
Rx sent to pharmacy. AS, CMA 

## 2022-04-12 NOTE — Telephone Encounter (Signed)
Patient is requesting a refill of diclofenac 75 mg for her arthritis. Please advise.  ?

## 2022-04-12 NOTE — Progress Notes (Signed)
?Established patient acute visit ? ? ?Patient: Samantha [name]   DOB: Dec 26, 1955   66 y.o. Female  MRN: [id number] ?Visit Date: 04/12/2022 ? ?Chief Complaint  ?Patient presents with  ? Ear Pain  ? ?Subjective  ?  ?HPI  ?Patient presents with c/o right earache which started Monday. Reports not feeling well. Has also developed sinus pressure, chest tightness, headache, and runny nose. No nasal congestion, cough or fever. Reports an episode of dizziness after getting up too quickly.  Has taken Tylenol and allergy medication. Feels worse today. ? ?Medications: ?Outpatient Medications Prior to Visit  ?Medication Sig  ? aspirin 81 MG tablet Take 81 mg by mouth every morning.   ? azelastine (ASTELIN) 0.1 % nasal spray Place 1 spray into both nostrils daily as needed for allergies.   ? budesonide-formoterol (SYMBICORT) 80-4.5 MCG/ACT inhaler Inhale 2 puffs into the lungs 2 (two) times daily.  ? Calcium Citrate-Vitamin D (CALCIUM + D PO) Take 1 tablet by mouth every morning.   ? cetirizine (ZYRTEC) 10 MG tablet Take 10 mg by mouth daily.  ? diclofenac Sodium (VOLTAREN) 1 % GEL Apply 2 g topically 4 (four) times daily.  ? FLUoxetine (PROZAC) 20 MG capsule Take 1 capsule by mouth once daily in the morning  ? fluticasone (FLONASE) 50 MCG/ACT nasal spray Place 2 sprays into both nostrils daily.  ? gabapentin (NEURONTIN) 300 MG capsule Take 1 capsule by mouth in the morning and 2 capsules in the evening.  ? hydrochlorothiazide (MICROZIDE) 12.5 MG capsule Take 12.5 mg by mouth daily.  ? ipratropium-albuterol (DUONEB) 0.5-2.5 (3) MG/3ML SOLN Inhale 3 mLs into the lungs daily as needed (wheezing).   ? lovastatin (MEVACOR) 20 MG tablet TAKE 1 TABLET BY MOUTH AT BEDTIME  ? metFORMIN (GLUCOPHAGE) 1000 MG tablet TAKE 1 TABLET BY MOUTH TWICE DAILY WITH A MEAL  ? methocarbamol (ROBAXIN) 500 MG tablet Take 1 tablet (500 mg total) by mouth every 8 (eight) hours as needed for muscle spasms.  ? nitroGLYCERIN (NITROSTAT) 0.4 MG SL tablet  Place 1 tablet (0.4 mg total) under the tongue every 5 (five) minutes as needed for chest pain.  ? Omega-3 Fatty Acids (FISH OIL) 1000 MG CAPS Take 1,000 mg by mouth every morning.   ? OZEMPIC, 1 MG/DOSE, 4 MG/3ML SOPN INJECT 0.75 MLS (1 MG TOTAL) INTO THE SKIN ONCE A WEEK (USE ONLY ONCE LOWER DOSES ARE FINISHED)  ? pantoprazole (PROTONIX) 40 MG tablet Take 1 tablet (40 mg total) by mouth daily.  ? TRESIBA FLEXTOUCH 100 UNIT/ML FlexTouch Pen INJECT 12 UNITS SUBCUTANEOUSLY ONCE DAILY  ? valsartan (DIOVAN) 160 MG tablet Take 1 tablet by mouth once daily  ? verapamil (CALAN-SR) 240 MG CR tablet TAKE 1 TABLET BY MOUTH ONCE DAILY IN THE MORNING  ? ?Facility-Administered Medications Prior to Visit  ?Medication Dose Route Frequency Provider  ? nitroGLYCERIN (NITROSTAT) SL tablet 0.4 mg  0.4 mg Sublingual Q5 min PRN Lorrene Reid, PA-C  ? ? ?Review of Systems ?Review of Systems:  ?A fourteen system review of systems was performed and found to be positive as per HPI. ? ?Last CBC ?Lab Results  ?Component Value Date  ? WBC 7.8 04/10/2022  ? HGB 11.1 04/10/2022  ? HCT 34.9 04/10/2022  ? MCV 88 04/10/2022  ? MCH 27.8 04/10/2022  ? RDW 12.9 04/10/2022  ? PLT 322 04/10/2022  ? ?Last metabolic panel ?Lab Results  ?Component Value Date  ? GLUCOSE 109 (H) 03/27/2022  ? NA 140 03/27/2022  ? K  4.8 03/27/2022  ? CL 98 03/27/2022  ? CO2 25 03/27/2022  ? BUN 30 (H) 03/27/2022  ? CREATININE 1.56 (H) 03/27/2022  ? EGFR 36 (L) 03/27/2022  ? CALCIUM 9.4 03/27/2022  ? PHOS 3.2 05/12/2010  ? PROT 6.9 02/22/2022  ? ALBUMIN 4.2 02/22/2022  ? LABGLOB 2.7 02/22/2022  ? AGRATIO 1.6 02/22/2022  ? BILITOT 0.3 02/22/2022  ? ALKPHOS 74 02/22/2022  ? AST 16 02/22/2022  ? ALT 18 02/22/2022  ? ANIONGAP 10 08/01/2021  ? ?Last lipids ?Lab Results  ?Component Value Date  ? CHOL 144 09/13/2021  ? HDL 55 09/13/2021  ? [place] 62 09/13/2021  ? TRIG 157 (H) 09/13/2021  ? CHOLHDL 2.6 09/13/2021  ? ?Last hemoglobin A1c ?Lab Results  ?Component Value Date  ?  HGBA1C 6.6 (A) 02/22/2022  ? ?Last thyroid functions ?Lab Results  ?Component Value Date  ? TSH 4.030 09/13/2021  ? ?Last vitamin D ?Lab Results  ?Component Value Date  ? VD25OH 36.2 09/13/2021  ? ?  Objective  ?  ?BP 110/64   Pulse 79   Temp 97.7 ?F (36.5 ?C)   Ht $R'5\' 10"'vx$  (1.778 m)   Wt 221 lb (100.2 kg)   SpO2 94%   BMI 31.71 kg/m?  ? ? ?Physical Exam  ?General:  Well Developed, well nourished, appropriate for stated age.  ?Neuro:  Alert and oriented,  extra-ocular muscles intact  ?HEENT:  Normocephalic, atraumatic, sinus pressure of frontal and maxillary sinus, PERRL, conjunctiva clear, some fluid behind left TM, small chronic tear of right TM, pale nasal mucosa, no boggy turbinates, normal posterior oropharynx, neck supple, +cervical adenopathy (submandibular)  ?Skin:  no gross rash, warm, pink. ?Cardiac:  RRR, S1 S2 ?Respiratory: CTA B/L w/o wheezing, crackles or rales. ?Vascular:  Ext warm, no cyanosis apprec.; cap RF less 2 sec. ?Psych:  No HI/SI, judgement and insight good, Euthymic mood. Full Affect. ? ? ?No results found for any visits on 04/12/22. ? Assessment & Plan  ?  ? ?Due to worsening symptoms will start oral antibiotic therapy for a developing bacterial sinusitis. Recommend to continue with home supportive care and antihistamine. Follow-up prn.  ? ? ?Return if symptoms worsen or fail to improve.  ?   ? ? ? ?Lorrene Reid, PA-C  ?[facility] ?[phone] (phone) ?[phone] (fax) ? ?[facility] ?

## 2022-04-26 ENCOUNTER — Other Ambulatory Visit: Payer: Self-pay | Admitting: Physician Assistant

## 2022-05-10 ENCOUNTER — Other Ambulatory Visit: Payer: Self-pay | Admitting: Physician Assistant

## 2022-05-10 DIAGNOSIS — E1169 Type 2 diabetes mellitus with other specified complication: Secondary | ICD-10-CM

## 2022-05-17 DIAGNOSIS — D3131 Benign neoplasm of right choroid: Secondary | ICD-10-CM | POA: Diagnosis not present

## 2022-05-17 DIAGNOSIS — H35371 Puckering of macula, right eye: Secondary | ICD-10-CM | POA: Diagnosis not present

## 2022-05-17 DIAGNOSIS — H2513 Age-related nuclear cataract, bilateral: Secondary | ICD-10-CM | POA: Diagnosis not present

## 2022-05-17 DIAGNOSIS — Z0100 Encounter for examination of eyes and vision without abnormal findings: Secondary | ICD-10-CM | POA: Diagnosis not present

## 2022-05-17 DIAGNOSIS — H43813 Vitreous degeneration, bilateral: Secondary | ICD-10-CM | POA: Diagnosis not present

## 2022-05-18 ENCOUNTER — Other Ambulatory Visit: Payer: Self-pay | Admitting: Physician Assistant

## 2022-05-18 ENCOUNTER — Other Ambulatory Visit: Payer: Medicare HMO

## 2022-05-18 DIAGNOSIS — Z0000 Encounter for general adult medical examination without abnormal findings: Secondary | ICD-10-CM | POA: Diagnosis not present

## 2022-05-18 DIAGNOSIS — E1169 Type 2 diabetes mellitus with other specified complication: Secondary | ICD-10-CM

## 2022-05-19 LAB — CBC WITH DIFFERENTIAL/PLATELET
Basophils Absolute: 0.1 10*3/uL (ref 0.0–0.2)
Basos: 1 %
EOS (ABSOLUTE): 0.3 10*3/uL (ref 0.0–0.4)
Eos: 4 %
Hematocrit: 34.7 % (ref 34.0–46.6)
Hemoglobin: 11.6 g/dL (ref 11.1–15.9)
Immature Grans (Abs): 0 10*3/uL (ref 0.0–0.1)
Immature Granulocytes: 0 %
Lymphocytes Absolute: 1.7 10*3/uL (ref 0.7–3.1)
Lymphs: 20 %
MCH: 28 pg (ref 26.6–33.0)
MCHC: 33.4 g/dL (ref 31.5–35.7)
MCV: 84 fL (ref 79–97)
Monocytes Absolute: 0.4 10*3/uL (ref 0.1–0.9)
Monocytes: 5 %
Neutrophils Absolute: 5.7 10*3/uL (ref 1.4–7.0)
Neutrophils: 70 %
Platelets: 328 10*3/uL (ref 150–450)
RBC: 4.15 x10E6/uL (ref 3.77–5.28)
RDW: 12 % (ref 11.7–15.4)
WBC: 8.2 10*3/uL (ref 3.4–10.8)

## 2022-05-19 LAB — HEMOGLOBIN A1C
Est. average glucose Bld gHb Est-mCnc: 134 mg/dL
Hgb A1c MFr Bld: 6.3 % — ABNORMAL HIGH (ref 4.8–5.6)

## 2022-05-19 LAB — COMPREHENSIVE METABOLIC PANEL
ALT: 17 IU/L (ref 0–32)
AST: 20 IU/L (ref 0–40)
Albumin/Globulin Ratio: 1.8 (ref 1.2–2.2)
Albumin: 4.2 g/dL (ref 3.8–4.8)
Alkaline Phosphatase: 68 IU/L (ref 44–121)
BUN/Creatinine Ratio: 18 (ref 12–28)
BUN: 24 mg/dL (ref 8–27)
Bilirubin Total: 0.3 mg/dL (ref 0.0–1.2)
CO2: 23 mmol/L (ref 20–29)
Calcium: 9.8 mg/dL (ref 8.7–10.3)
Chloride: 99 mmol/L (ref 96–106)
Creatinine, Ser: 1.3 mg/dL — ABNORMAL HIGH (ref 0.57–1.00)
Globulin, Total: 2.4 g/dL (ref 1.5–4.5)
Glucose: 190 mg/dL — ABNORMAL HIGH (ref 70–99)
Potassium: 4.4 mmol/L (ref 3.5–5.2)
Sodium: 139 mmol/L (ref 134–144)
Total Protein: 6.6 g/dL (ref 6.0–8.5)
eGFR: 45 mL/min/{1.73_m2} — ABNORMAL LOW (ref >59)

## 2022-05-19 LAB — LIPID PANEL
Chol/HDL Ratio: 2.4 ratio (ref 0.0–4.4)
Cholesterol, Total: 138 mg/dL (ref 100–199)
HDL: 58 mg/dL (ref >39)
LDL Chol Calc (NIH): 56 mg/dL (ref 0–99)
Triglycerides: 143 mg/dL (ref 0–149)
VLDL Cholesterol Cal: 24 mg/dL (ref 5–40)

## 2022-05-19 LAB — TSH: TSH: 6.09 u[IU]/mL — ABNORMAL HIGH (ref 0.450–4.500)

## 2022-05-25 ENCOUNTER — Encounter: Payer: Self-pay | Admitting: Physician Assistant

## 2022-05-25 ENCOUNTER — Ambulatory Visit (INDEPENDENT_AMBULATORY_CARE_PROVIDER_SITE_OTHER): Payer: Medicare HMO | Admitting: Physician Assistant

## 2022-05-25 VITALS — BP 131/71 | HR 72 | Temp 97.7°F | Ht 70.0 in | Wt 218.0 lb

## 2022-05-25 DIAGNOSIS — N1831 Chronic kidney disease, stage 3a: Secondary | ICD-10-CM | POA: Diagnosis not present

## 2022-05-25 DIAGNOSIS — E1169 Type 2 diabetes mellitus with other specified complication: Secondary | ICD-10-CM | POA: Diagnosis not present

## 2022-05-25 DIAGNOSIS — F39 Unspecified mood [affective] disorder: Secondary | ICD-10-CM | POA: Diagnosis not present

## 2022-05-25 DIAGNOSIS — R7989 Other specified abnormal findings of blood chemistry: Secondary | ICD-10-CM | POA: Diagnosis not present

## 2022-05-25 DIAGNOSIS — E1140 Type 2 diabetes mellitus with diabetic neuropathy, unspecified: Secondary | ICD-10-CM | POA: Diagnosis not present

## 2022-05-25 DIAGNOSIS — E785 Hyperlipidemia, unspecified: Secondary | ICD-10-CM

## 2022-05-25 DIAGNOSIS — Z794 Long term (current) use of insulin: Secondary | ICD-10-CM

## 2022-05-25 MED ORDER — FLUOXETINE HCL 40 MG PO CAPS: 40.0000 mg | ORAL_CAPSULE | Freq: Every day | ORAL | 1 refills | Status: DC

## 2022-05-25 NOTE — Assessment & Plan Note (Signed)
>>  ASSESSMENT AND PLAN FOR DIABETES WRITTEN ON 05/25/2022  2:32 PM BY ABONZA, MARITZA, PA-C  -Discussed with patient recent A1c which has improved from 6.6 to 6.3, will continue Metformin 1000 mg BID and Ozempic 1 mg weekly. Will plan to collect microalbumin at f/up visit. Continue ambulatory glucose monitoring. Continue with diabetic diet.

## 2022-05-25 NOTE — Assessment & Plan Note (Signed)
-  Discussed with patient recent A1c which has improved from 6.6 to 6.3, will continue Metformin 1000 mg BID and Ozempic 1 mg weekly. Will plan to collect microalbumin at f/up visit. Continue ambulatory glucose monitoring. Continue with diabetic diet.

## 2022-05-25 NOTE — Assessment & Plan Note (Signed)
-  Discussed with patient recent lipid panel. LDL 56, at goal <70. Recent hepatic function normal. Will continue Lovastatin 20 mg daily. Will continue to monitor.

## 2022-05-25 NOTE — Patient Instructions (Signed)
Diabetes Mellitus and Sick Day Management Blood sugar (glucose) can be difficult to control when you are sick. Common illnesses that can cause problems for people with diabetes (diabetes mellitus) include colds, fever, flu (influenza), nausea, vomiting, and diarrhea. These illnesses can cause stress and loss of body fluids (dehydration), and those issues can cause blood glucose levels to increase. Because of this, it is very important to take your insulin and diabetes medicines and eat some form of carbohydrate when you are sick. You should make a plan for days when you are sick (sick day plan) as part of your diabetes management plan. You and your health care provider should make this plan in advance. The following guidelines are intended to help you manage an illness that lasts for about 24 hours or less. Your health care provider may also give you more specific instructions. How to manage your blood glucose  Check your blood glucose every 2-4 hours, or as often as told by your health care provider. If you use insulin, take your usual dose. If your blood glucose continues to be too high, you may need to take an additional insulin dose as told by your health care provider. Know your sick day treatment goals. Your target blood glucose levels may be different when you are sick. If you use oral diabetes medicine, continue to take your medicines. Have a plan with your health care provider for these medicines while you are sick. If you use injectable hormone medicines other than insulin to control your diabetes, have a plan with your health care provider for these medicines while you are sick. Follow these instructions at home Check your ketones If you have type 1 diabetes, check your urine ketones every 4 hours. If you have type 2 diabetes, check your urine ketones as often as told by your health care provider. Eating and drinking Drink enough fluid to keep your urine pale yellow. This is especially  important if you have a fever, vomiting, or diarrhea. Those symptoms can lead to dehydration. Follow instructions from your health care provider about beverages to avoid. Do not drink alcohol, caffeine, or drinks that contain a lot of sugar. You need to eat some form of carbohydrates when you are sick. Eat 45-50 grams (45-50 g) of carbohydrates every 3-4 hours until you feel better. All of the food choices below contain about 15 g of carbohydrates. Plan ahead and keep some of these foods around so you have them if you get sick. 4-6 oz (120-177 mL) carbonated beverage that contains sugar, such as regular (not diet) soda. You may be able to drink carbonated beverages more easily if you open the beverage and let it sit at room temperature for a few minutes before drinking.  of a twin frozen ice pop. 4 oz (120 g) regular gelatin. 4 oz (120 mL) fruit juice. 4 oz (120 g) ice cream or frozen yogurt. 2 oz (60 g) sherbet. 1 slice bread or toast. 6 saltine crackers. 5 vanilla wafers. Medicines Take-over-the-counter and prescription medicines only as told by your health care provider. Check medicine labels for added sugars. Some medicines may contain sugar or types of sugars that can raise your blood glucose level. Questions to ask your health care provider Should I adjust my diabetes medicines? How often do I need to check my blood glucose? What supplies do I need to manage my diabetes at home when I am sick? What number can I call if I have questions? What foods and drinks should  I avoid? Contact a health care provider if: You have been sick or have had a fever for 2 days or longer and you are not getting better. Your blood glucose is at or above 240 mg/dl (13.3 mmol/L), even after you take an additional insulin dose. You are unable to drink fluids without vomiting. You have any of the following for more than 6 hours: Nausea. Vomiting. Diarrhea. Get help right away if: You have difficulty  breathing. You have moderate or high ketone levels in your urine. You have a change in how you think, feel, or act (mental status). You develop symptoms of diabetic ketoacidosis. These include: Nausea. Vomiting. Excessive thirst. Excessive urination. Fruity or sweet smelling breath. Rapid breathing. Pain in the abdomen. Your blood glucose is lower than '54mg'$ /dl (3.0 mmol/L). You used emergency glucagon to treat low blood glucose. These symptoms may represent a serious problem that is an emergency. Do not wait to see if the symptoms will go away. Get medical help right away. Call your local emergency services (911 in the [place] Do not drive yourself to the hospital. Summary Blood sugar (glucose) can be difficult to control when you are sick. Common illnesses that can cause problems for people with diabetes (diabetes mellitus) include colds, fever, flu (influenza), nausea, vomiting, and diarrhea. Illnesses can cause stress and loss of body fluids (dehydration), and those issues can cause blood glucose levels to increase. Make a plan for days when you are sick (sick day plan) as part of your diabetes management plan. You and your health care provider should make this plan in advance. It is very important to take your insulin and diabetes medicines and to eat some form of carbohydrate when you are sick. Contact your health care provider if have problems managing your blood glucose levels when you are sick, or if you have been sick or had a fever for 2 days or longer and are not getting better. This information is not intended to replace advice given to you by your health care provider. Make sure you discuss any questions you have with your health care provider. Document Revised: 12/04/2019 Document Reviewed: 12/04/2019 Elsevier Patient Education  [place].

## 2022-05-25 NOTE — Assessment & Plan Note (Signed)
-  PHQ-9 score higher than baseline and patient reports worsening depressive symptomatology so will increase Prozac to 40 mg. Patient is agreeable. Advised to let me know if unable to tolerate increased dose. Will continue to monitor and reassess at f/up visit.

## 2022-05-25 NOTE — Progress Notes (Signed)
Established patient visit   Patient: Samantha [name]   DOB: 1956/08/10   66 y.o. Female  MRN: [id number] Visit Date: 05/25/2022  Chief Complaint  Patient presents with   Diabetes   Subjective    HPI  Patient presents for chronic follow-up.  Diabetes: Pt denies increased urination or thirst. Pt reports medication compliance. No hypoglycemic events. Checking glucose at home. FBS range from 110-130. Has been monitoring her carbohydrate and sugar intake.   HLD: Pt taking medication as directed without issues. Denies myalgias. Patient has chronic joint pain.   Mood: Patient reports has been feeling down. Denies stress. Reports medication compliance. No SI/HI.   Neuropathy: Reports symptoms are better controlled with Gabapentin.    Medications: Outpatient Medications Prior to Visit  Medication Sig   aspirin 81 MG tablet Take 81 mg by mouth every morning.    azelastine (ASTELIN) 0.1 % nasal spray Place 1 spray into both nostrils daily as needed for allergies.    budesonide-formoterol (SYMBICORT) 80-4.5 MCG/ACT inhaler Inhale 2 puffs into the lungs 2 (two) times daily.   Calcium Citrate-Vitamin D (CALCIUM + D PO) Take 1 tablet by mouth every morning.    cetirizine (ZYRTEC) 10 MG tablet Take 10 mg by mouth daily.   diclofenac (VOLTAREN) 75 MG EC tablet Take 1 tablet (75 mg total) by mouth 2 (two) times daily.   diclofenac Sodium (VOLTAREN) 1 % GEL Apply 2 g topically 4 (four) times daily.   fluticasone (FLONASE) 50 MCG/ACT nasal spray Place 2 sprays into both nostrils daily.   gabapentin (NEURONTIN) 300 MG capsule TAKE 1 CAPSULE BY MOUTH IN THE MORNING AND 2 IN THE EVENING   hydrochlorothiazide (MICROZIDE) 12.5 MG capsule Take 12.5 mg by mouth daily.   ipratropium-albuterol (DUONEB) 0.5-2.5 (3) MG/3ML SOLN Inhale 3 mLs into the lungs daily as needed (wheezing).    lovastatin (MEVACOR) 20 MG tablet TAKE 1 TABLET BY MOUTH AT BEDTIME   metFORMIN (GLUCOPHAGE) 1000 MG tablet TAKE 1 TABLET  BY MOUTH TWICE DAILY WITH A MEAL   methocarbamol (ROBAXIN) 500 MG tablet Take 1 tablet (500 mg total) by mouth every 8 (eight) hours as needed for muscle spasms.   nitroGLYCERIN (NITROSTAT) 0.4 MG SL tablet Place 1 tablet (0.4 mg total) under the tongue every 5 (five) minutes as needed for chest pain.   Omega-3 Fatty Acids (FISH OIL) 1000 MG CAPS Take 1,000 mg by mouth every morning.    OZEMPIC, 1 MG/DOSE, 4 MG/3ML SOPN INJECT 0.75 MLS (1 MG TOTAL) INTO THE SKIN ONCE A WEEK (USE ONLY ONCE LOWER DOSES ARE FINISHED)   pantoprazole (PROTONIX) 40 MG tablet Take 1 tablet (40 mg total) by mouth daily.   TRESIBA FLEXTOUCH 100 UNIT/ML FlexTouch Pen INJECT 12 UNITS SUBCUTANEOUSLY ONCE DAILY   valsartan (DIOVAN) 160 MG tablet Take 1 tablet by mouth once daily   verapamil (CALAN-SR) 240 MG CR tablet TAKE 1 TABLET BY MOUTH ONCE DAILY IN THE MORNING   [DISCONTINUED] FLUoxetine (PROZAC) 20 MG capsule Take 1 capsule by mouth once daily in the morning   Facility-Administered Medications Prior to Visit  Medication Dose Route Frequency Provider   nitroGLYCERIN (NITROSTAT) SL tablet 0.4 mg  0.4 mg Sublingual Q5 min PRN Lorrene Reid, PA-C      05/25/2022   10:10 AM 04/12/2022    1:57 PM 03/14/2022    3:27 PM 02/14/2022   11:17 AM 09/22/2021    9:37 AM  Depression screen PHQ 2/9  Decreased Interest 0 1 1 0 0  Down, Depressed, Hopeless 1 0 0 0 0  PHQ - 2 Score $Remov'1 1 1 'WuUlVU$ 0 0  Altered sleeping 2 0 0  0  Tired, decreased energy $RemoveBeforeDE'3 1 1  'hWzouBtUoMAcoXv$ 0  Change in appetite 1 1 0  0  Feeling bad or failure about yourself  1 0 0  0  Trouble concentrating 2 0 1  0  Moving slowly or fidgety/restless 2 0 0  0  Suicidal thoughts 0 0 0  0  PHQ-9 Score $RemoveBef'12 3 3  'HLdCFCSHoq$ 0  Difficult doing work/chores Somewhat difficult Not difficult at all Somewhat difficult  Not difficult at all      05/25/2022   10:10 AM 04/12/2022    1:58 PM 03/14/2022    3:27 PM 09/22/2021    9:37 AM  GAD 7 : Generalized Anxiety Score  Nervous, Anxious, on Edge 0 0 0 0   Control/stop worrying 0 0 0 0  Worry too much - different things 2 0 0 0  Trouble relaxing 0 0 0 0  Restless 0 0 0 0  Easily annoyed or irritable 0 1 0 0  Afraid - awful might happen 0 0 0 0  Total GAD 7 Score 2 1 0 0  Anxiety Difficulty Somewhat difficult Not difficult at all Not difficult at all Not difficult at all      Review of Systems Review of Systems:  A fourteen system review of systems was performed and found to be positive as per HPI.     Objective    BP 131/71   Pulse 72   Temp 97.7 F (36.5 C)   Ht $R'5\' 10"'lt$  (1.778 m)   Wt 218 lb (98.9 kg)   SpO2 96%   BMI 31.28 kg/m  BP Readings from Last 3 Encounters:  05/25/22 131/71  04/12/22 110/64  03/28/22 (!) 125/59   Wt Readings from Last 3 Encounters:  05/25/22 218 lb (98.9 kg)  04/12/22 221 lb (100.2 kg)  03/28/22 218 lb (98.9 kg)    Physical Exam  General:  Pleasant and cooperative, appropriate for stated age.  Neuro:  Alert and oriented,  extra-ocular muscles intact  HEENT:  Normocephalic, atraumatic, neck supple  Skin:  no gross rash, warm, pink. Cardiac:  RRR, S1 S2 Respiratory: CTA B/L w/o wheezing, crackles or rales. Vascular:  Ext warm, no cyanosis apprec.; no edema  Psych:  No HI/SI, judgement and insight good, Euthymic mood. Full Affect.   No results found for any visits on 05/25/22.  Assessment & Plan      Problem List Items Addressed This Visit       Endocrine   Diabetes ([place]) - Primary    -Discussed with patient recent A1c which has improved from 6.6 to 6.3, will continue Metformin 1000 mg BID and Ozempic 1 mg weekly. Will plan to collect microalbumin at f/up visit. Continue ambulatory glucose monitoring. Continue with diabetic diet.       Hyperlipidemia associated with type 2 diabetes mellitus ([place])    -Discussed with patient recent lipid panel. LDL 56, at goal <70. Recent hepatic function normal. Will continue Lovastatin 20 mg daily. Will continue to monitor.        Other   Mood  disorder (HCC)    -PHQ-9 score higher than baseline and patient reports worsening depressive symptomatology so will increase Prozac to 40 mg. Patient is agreeable. Advised to let me know if unable to tolerate increased dose. Will continue to monitor and reassess at f/up visit.  Relevant Medications   FLUoxetine (PROZAC) 40 MG capsule   Other Visit Diagnoses     Elevated TSH       Stage 3a chronic kidney disease (HCC)           Stage 3a chronic kidney disease: -Discussed with patient recent renal function which has improved from prior, Cr 1.30 and eGFR 45. Will plan to discuss with patient starting low dose ACEi/ARB for renal protection. Will continue to monitor. Wilder Glade is an alternative but cost is a concern.  Elevated TSH: -Discussed with patient mild elevation of TSH and recommend repeating TSH and obtaining free T4 and T3 in 6 weeks. No prior hx of thyroid issues.    Return in about 6 weeks (around 07/06/2022) for lab visit for thyroid panel (TSH, free t4, t3); f/up visit in 4 months for DM, HLD, mood.        Lorrene Reid, PA-C  [facility] at [facility] [phone] (phone) [phone] (fax)  [place]

## 2022-06-02 DIAGNOSIS — M1711 Unilateral primary osteoarthritis, right knee: Secondary | ICD-10-CM | POA: Diagnosis not present

## 2022-06-02 DIAGNOSIS — M7061 Trochanteric bursitis, right hip: Secondary | ICD-10-CM | POA: Diagnosis not present

## 2022-06-08 ENCOUNTER — Other Ambulatory Visit: Payer: Self-pay | Admitting: Physician Assistant

## 2022-06-14 ENCOUNTER — Ambulatory Visit (INDEPENDENT_AMBULATORY_CARE_PROVIDER_SITE_OTHER): Payer: Medicare HMO | Admitting: Physician Assistant

## 2022-06-14 ENCOUNTER — Encounter: Payer: Self-pay | Admitting: Physician Assistant

## 2022-06-14 VITALS — BP 128/62 | HR 74 | Temp 97.7°F | Ht 70.0 in | Wt 218.0 lb

## 2022-06-14 DIAGNOSIS — J0141 Acute recurrent pansinusitis: Secondary | ICD-10-CM | POA: Diagnosis not present

## 2022-06-14 MED ORDER — DM-GUAIFENESIN ER 30-600 MG PO TB12: 1.0000 | ORAL_TABLET | Freq: Two times a day (BID) | ORAL | 0 refills | Status: DC

## 2022-06-14 MED ORDER — AMOXICILLIN-POT CLAVULANATE 875-125 MG PO TABS: 1.0000 | ORAL_TABLET | Freq: Two times a day (BID) | ORAL | 0 refills | Status: DC

## 2022-06-14 NOTE — Patient Instructions (Signed)

## 2022-06-14 NOTE — Progress Notes (Signed)
Established patient acute visit   Patient: Samantha [name]   DOB: 1956-07-05   66 y.o. Female  MRN: [id number] Visit Date: 06/14/2022  Chief Complaint  Patient presents with   Sinusitis   Subjective    HPI  Patient presents with c/o sinus pressure/congestion, headache, runny nose, sore throat, and bilateral earache. Symptoms started Saturday night with ears itching. Feeling worse today. No fever, cough, otorrhea, wheezing or shortness of breath. Has taken Tylenol 1000 mg for headache. Light sensitive.     Medications: Outpatient Medications Prior to Visit  Medication Sig   aspirin 81 MG tablet Take 81 mg by mouth every morning.    azelastine (ASTELIN) 0.1 % nasal spray Place 1 spray into both nostrils daily as needed for allergies.    budesonide-formoterol (SYMBICORT) 80-4.5 MCG/ACT inhaler Inhale 2 puffs into the lungs 2 (two) times daily.   Calcium Citrate-Vitamin D (CALCIUM + D PO) Take 1 tablet by mouth every morning.    cetirizine (ZYRTEC) 10 MG tablet Take 10 mg by mouth daily.   diclofenac (VOLTAREN) 75 MG EC tablet Take 1 tablet (75 mg total) by mouth 2 (two) times daily.   diclofenac Sodium (VOLTAREN) 1 % GEL Apply 2 g topically 4 (four) times daily.   FLUoxetine (PROZAC) 40 MG capsule Take 1 capsule (40 mg total) by mouth daily.   fluticasone (FLONASE) 50 MCG/ACT nasal spray Place 2 sprays into both nostrils daily.   gabapentin (NEURONTIN) 300 MG capsule TAKE 1 CAPSULE BY MOUTH IN THE MORNING AND 2 IN THE EVENING   hydrochlorothiazide (MICROZIDE) 12.5 MG capsule Take 12.5 mg by mouth daily.   Insulin Degludec FlexTouch 100 UNIT/ML SOPN INJECT 12 UNITS SUBCUTANEOUSLY ONCE DAILY   ipratropium-albuterol (DUONEB) 0.5-2.5 (3) MG/3ML SOLN Inhale 3 mLs into the lungs daily as needed (wheezing).    lovastatin (MEVACOR) 20 MG tablet TAKE 1 TABLET BY MOUTH AT BEDTIME   metFORMIN (GLUCOPHAGE) 1000 MG tablet TAKE 1 TABLET BY MOUTH TWICE DAILY WITH A MEAL   methocarbamol (ROBAXIN)  500 MG tablet Take 1 tablet (500 mg total) by mouth every 8 (eight) hours as needed for muscle spasms.   nitroGLYCERIN (NITROSTAT) 0.4 MG SL tablet Place 1 tablet (0.4 mg total) under the tongue every 5 (five) minutes as needed for chest pain.   Omega-3 Fatty Acids (FISH OIL) 1000 MG CAPS Take 1,000 mg by mouth every morning.    OZEMPIC, 1 MG/DOSE, 4 MG/3ML SOPN INJECT 0.75 MLS (1 MG TOTAL) INTO THE SKIN ONCE A WEEK (USE ONLY ONCE LOWER DOSES ARE FINISHED)   pantoprazole (PROTONIX) 40 MG tablet Take 1 tablet (40 mg total) by mouth daily.   valsartan (DIOVAN) 160 MG tablet Take 1 tablet by mouth once daily   verapamil (CALAN-SR) 240 MG CR tablet TAKE 1 TABLET BY MOUTH ONCE DAILY IN THE MORNING   Facility-Administered Medications Prior to Visit  Medication Dose Route Frequency Provider   nitroGLYCERIN (NITROSTAT) SL tablet 0.4 mg  0.4 mg Sublingual Q5 min PRN Coleston Dirosa, PA-C    Review of Systems Review of Systems:  A fourteen system review of systems was performed and found to be positive as per HPI.     Objective    BP 128/62   Pulse 74   Temp 97.7 F (36.5 C)   Ht '5\' 10"'$  (1.778 m)   Wt 218 lb (98.9 kg)   SpO2 95%   BMI 31.28 kg/m    Physical Exam  General:  In no acute distress, ill-appearing,  appropriate for stated age.  Neuro:  Alert and oriented,  extra-ocular muscles intact  HEENT:  Normocephalic, atraumatic, PERRL, maxillary and frontal tenderness, mild swelling of left TM without redness, chronic scar tissue and perforation of right TM, pale nasal mucosa, mild erythema of posterior oropharynx without exudates, neck supple, +left submandibular gland swolllen  Skin:  no gross rash, warm, pink. Cardiac:  RRR, S1 S2 Respiratory: CTA B/L w/o wheezing, crackles or rales. Vascular:  Ext warm, no cyanosis apprec.; cap RF less 2 sec. Psych:  No HI/SI, judgement and insight good, Euthymic mood. Full Affect.   No results found for any visits on 06/14/22.  Assessment & Plan      Patient presenting with worsening upper respiratory symptoms so will start oral antibiotic with Augmentin 875-125 mg BID x 10 days for developing bacterial sinus infection. Recommend to take a decongestant as directed. Discussed supportive care. Advised if symptoms fail to improve or worsen will consider oral steroid but will defer at this time. Pt verbalized understanding.   Return if symptoms worsen or fail to improve.        Lorrene Reid, PA-C  [facility] Primary Care at [facility] [phone] (phone) [phone] (fax)  [place]

## 2022-06-15 ENCOUNTER — Telehealth: Payer: Self-pay | Admitting: Physician Assistant

## 2022-06-15 DIAGNOSIS — J0141 Acute recurrent pansinusitis: Secondary | ICD-10-CM

## 2022-06-15 MED ORDER — METHYLPREDNISOLONE 4 MG PO TBPK: ORAL_TABLET | ORAL | 0 refills | Status: DC

## 2022-06-15 NOTE — Telephone Encounter (Signed)
Patient called and stated that Samantha [name] had told her yesterday if she was no better to call back and she is not she said if anything she is worse. Please advise.

## 2022-06-15 NOTE — Telephone Encounter (Signed)
Per Maritza sending in Medrol Dose pack. Patient is aware of the results and verbalized understanding. AS, CMA

## 2022-06-27 ENCOUNTER — Other Ambulatory Visit: Payer: Self-pay | Admitting: Physician Assistant

## 2022-06-30 ENCOUNTER — Other Ambulatory Visit: Payer: Self-pay | Admitting: *Deleted

## 2022-06-30 NOTE — Patient Outreach (Signed)
  Care Coordination   Initial Visit Note   06/30/2022 Name: Samantha [name] MRN: [id number] DOB: Sep 17, 1956  Samantha [name] is a 66 y.o. year old female who sees Lorrene Reid, [place] for primary care. I spoke with  Eliberto Ivory by phone today Patient wanted to talk with her Dr office to make sure a valid company and will call back.   SDOH assessments and interventions completed:  No     Care Coordination Interventions Activated:  Yes  Care Coordination Interventions:  No, not indicated   Follow up plan: No further intervention required.   Encounter Outcome:  Pt. Request to Call [facility] [phone]

## 2022-07-03 DIAGNOSIS — M1711 Unilateral primary osteoarthritis, right knee: Secondary | ICD-10-CM | POA: Diagnosis not present

## 2022-07-05 ENCOUNTER — Other Ambulatory Visit: Payer: Self-pay

## 2022-07-05 ENCOUNTER — Encounter: Payer: Self-pay | Admitting: Ophthalmology

## 2022-07-05 DIAGNOSIS — H2512 Age-related nuclear cataract, left eye: Secondary | ICD-10-CM | POA: Diagnosis not present

## 2022-07-05 DIAGNOSIS — E039 Hypothyroidism, unspecified: Secondary | ICD-10-CM

## 2022-07-05 NOTE — Discharge Instructions (Signed)

## 2022-07-06 ENCOUNTER — Other Ambulatory Visit: Payer: Medicare HMO

## 2022-07-06 DIAGNOSIS — E039 Hypothyroidism, unspecified: Secondary | ICD-10-CM

## 2022-07-07 LAB — T3: T3, Total: 97 ng/dL (ref 71–180)

## 2022-07-07 LAB — THYROID PANEL WITH TSH
Free Thyroxine Index: 1.4 (ref 1.2–4.9)
T3 Uptake Ratio: 26 % (ref 24–39)
T4, Total: 5.2 ug/dL (ref 4.5–12.0)
TSH: 2.2 u[IU]/mL (ref 0.450–4.500)

## 2022-07-07 LAB — T4, FREE: Free T4: 1.02 ng/dL (ref 0.82–1.77)

## 2022-07-08 NOTE — Anesthesia Preprocedure Evaluation (Signed)
Anesthesia Evaluation  Patient identified by MRN, date of birth, ID band Patient awake    Reviewed: Allergy & Precautions, NPO status , Patient's Chart, lab work & pertinent test results  History of Anesthesia Complications Negative for: history of anesthetic complications  Airway Mallampati: III  TM Distance: >3 FB Neck ROM: full    Dental no notable dental hx.    Pulmonary neg pulmonary ROS,    Pulmonary exam normal        Cardiovascular hypertension, Pt. on medications +CHF (Grade 1 DD)  Normal cardiovascular exam+ dysrhythmias (LBBB) + Valvular Problems/Murmurs   Normal LHC 06/2021  ECHO 02/2022: Normal left ventricular systolic function. She has grade 1 diastolic dysfunction Mild mitral regurgitation mild aortic insufficiency   Neuro/Psych negative neurological ROS  negative psych ROS   GI/Hepatic Neg liver ROS, GERD  Controlled and Medicated,  Endo/Other  diabetes, Type 2, Insulin Dependent, Oral Hypoglycemic Agents  Renal/GU CRFRenal disease     Musculoskeletal  (+) Arthritis ,   Abdominal (+) + obese,   Peds  Hematology negative hematology ROS (+)   Anesthesia Other Findings Past Medical History: No date: Arthritis No date: Dyslipidemia No date: Heart disease 2011: Heart murmur 2011: Left bundle branch block No date: Type II or unspecified type diabetes mellitus without  mention of complication, not stated as uncontrolled No date: Unspecified essential hypertension  Past Surgical History: 06/30/2021: LEFT HEART CATH AND CORONARY ANGIOGRAPHY; N/A     Comment:  Procedure: LEFT HEART CATH AND CORONARY ANGIOGRAPHY;                Surgeon: Troy Sine, MD;  Location: [facility]               [facility];  Service: Cardiovascular;  Laterality: N/A; 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; Left 1962: TONSILLECTOMY  BMI    Body Mass Index: 30.85 kg/m      Reproductive/Obstetrics negative OB ROS                             Anesthesia Physical Anesthesia Plan  ASA: 2  Anesthesia Plan: MAC   Post-op Pain Management: Minimal or no pain anticipated   Induction: Intravenous  PONV Risk Score and Plan: Treatment may vary due to age or medical condition  Airway Management Planned: Natural Airway and Nasal Cannula  Additional Equipment:   Intra-op Plan:   Post-operative Plan:   Informed Consent: I have reviewed the patients History and Physical, chart, labs and discussed the procedure including the risks, benefits and alternatives for the proposed anesthesia with the patient or authorized representative who has indicated his/her understanding and acceptance.       Plan Discussed with: CRNA  Anesthesia Plan Comments:        Anesthesia Quick Evaluation

## 2022-07-11 ENCOUNTER — Encounter
Admission: RE | Disposition: A | Discharge status: Home / Self Care | Payer: Self-pay | Source: Home / Self Care | Attending: Ophthalmology

## 2022-07-11 ENCOUNTER — Ambulatory Visit: Payer: Medicare HMO | Admitting: Anesthesiology

## 2022-07-11 ENCOUNTER — Other Ambulatory Visit: Payer: Self-pay

## 2022-07-11 ENCOUNTER — Ambulatory Visit
Admission: RE | Admit: 2022-07-11 | Discharge: 2022-07-11 | Disposition: A | Discharge status: Home / Self Care | Payer: Medicare HMO | Attending: Ophthalmology | Admitting: Ophthalmology

## 2022-07-11 ENCOUNTER — Ambulatory Visit (AMBULATORY_SURGERY_CENTER): Payer: Medicare HMO | Admitting: Anesthesiology

## 2022-07-11 ENCOUNTER — Encounter: Payer: Self-pay | Admitting: Ophthalmology

## 2022-07-11 DIAGNOSIS — K219 Gastro-esophageal reflux disease without esophagitis: Secondary | ICD-10-CM | POA: Insufficient documentation

## 2022-07-11 DIAGNOSIS — Z7984 Long term (current) use of oral hypoglycemic drugs: Secondary | ICD-10-CM | POA: Diagnosis not present

## 2022-07-11 DIAGNOSIS — I110 Hypertensive heart disease with heart failure: Secondary | ICD-10-CM | POA: Diagnosis not present

## 2022-07-11 DIAGNOSIS — Z6831 Body mass index (BMI) 31.0-31.9, adult: Secondary | ICD-10-CM | POA: Insufficient documentation

## 2022-07-11 DIAGNOSIS — I509 Heart failure, unspecified: Secondary | ICD-10-CM

## 2022-07-11 DIAGNOSIS — E669 Obesity, unspecified: Secondary | ICD-10-CM | POA: Insufficient documentation

## 2022-07-11 DIAGNOSIS — E1136 Type 2 diabetes mellitus with diabetic cataract: Secondary | ICD-10-CM | POA: Diagnosis not present

## 2022-07-11 DIAGNOSIS — H2512 Age-related nuclear cataract, left eye: Secondary | ICD-10-CM | POA: Diagnosis not present

## 2022-07-11 DIAGNOSIS — Z794 Long term (current) use of insulin: Secondary | ICD-10-CM | POA: Insufficient documentation

## 2022-07-11 DIAGNOSIS — I130 Hypertensive heart and chronic kidney disease with heart failure and stage 1 through stage 4 chronic kidney disease, or unspecified chronic kidney disease: Secondary | ICD-10-CM

## 2022-07-11 DIAGNOSIS — I447 Left bundle-branch block, unspecified: Secondary | ICD-10-CM | POA: Insufficient documentation

## 2022-07-11 DIAGNOSIS — E1122 Type 2 diabetes mellitus with diabetic chronic kidney disease: Secondary | ICD-10-CM

## 2022-07-11 DIAGNOSIS — I5030 Unspecified diastolic (congestive) heart failure: Secondary | ICD-10-CM | POA: Insufficient documentation

## 2022-07-11 DIAGNOSIS — E1169 Type 2 diabetes mellitus with other specified complication: Secondary | ICD-10-CM

## 2022-07-11 HISTORY — PX: CATARACT EXTRACTION W/PHACO: SHX586

## 2022-07-11 LAB — GLUCOSE, CAPILLARY: Glucose-Capillary: 83 mg/dL (ref 70–99)

## 2022-07-11 SURGERY — PHACOEMULSIFICATION, CATARACT, WITH IOL INSERTION
Anesthesia: Monitor Anesthesia Care | Site: Eye | Laterality: Left

## 2022-07-11 MED ORDER — SIGHTPATH DOSE#1 BSS IO SOLN
INTRAOCULAR | Status: DC | PRN
  Administered 2022-07-11: 15 mL

## 2022-07-11 MED ORDER — BRIMONIDINE TARTRATE-TIMOLOL 0.2-0.5 % OP SOLN
OPHTHALMIC | Status: DC | PRN
  Administered 2022-07-11: 1 [drp] via OPHTHALMIC

## 2022-07-11 MED ORDER — FENTANYL CITRATE (PF) 100 MCG/2ML IJ SOLN
INTRAMUSCULAR | Status: DC | PRN
  Administered 2022-07-11: 50 ug via INTRAVENOUS

## 2022-07-11 MED ORDER — ARMC OPHTHALMIC DILATING DROPS
1.0000 | OPHTHALMIC | Status: DC | PRN
  Administered 2022-07-11 (×3): 1 via OPHTHALMIC

## 2022-07-11 MED ORDER — SIGHTPATH DOSE#1 NA CHONDROIT SULF-NA HYALURON 40-17 MG/ML IO SOLN
INTRAOCULAR | Status: DC | PRN
  Administered 2022-07-11: 1 mL via INTRAOCULAR

## 2022-07-11 MED ORDER — SIGHTPATH DOSE#1 BSS IO SOLN
INTRAOCULAR | Status: DC | PRN
  Administered 2022-07-11: 46 mL via OPHTHALMIC

## 2022-07-11 MED ORDER — MOXIFLOXACIN HCL 0.5 % OP SOLN
OPHTHALMIC | Status: DC | PRN
  Administered 2022-07-11: 0.2 mL via OPHTHALMIC

## 2022-07-11 MED ORDER — TETRACAINE HCL 0.5 % OP SOLN
1.0000 [drp] | OPHTHALMIC | Status: DC | PRN
  Administered 2022-07-11 (×3): 1 [drp] via OPHTHALMIC

## 2022-07-11 MED ORDER — SIGHTPATH DOSE#1 BSS IO SOLN
INTRAOCULAR | Status: DC | PRN
  Administered 2022-07-11: 1 mL

## 2022-07-11 MED ORDER — MIDAZOLAM HCL 2 MG/2ML IJ SOLN
INTRAMUSCULAR | Status: DC | PRN
  Administered 2022-07-11: 1 mg via INTRAVENOUS

## 2022-07-11 SURGICAL SUPPLY — 16 items
CANNULA ANT/CHMB 27G (MISCELLANEOUS) IMPLANT
CANNULA ANT/CHMB 27GA (MISCELLANEOUS) IMPLANT
CATARACT SUITE SIGHTPATH (MISCELLANEOUS) ×2 IMPLANT
FEE CATARACT SUITE SIGHTPATH (MISCELLANEOUS) ×1 IMPLANT
GLOVE SURG ENC TEXT LTX SZ8 (GLOVE) ×2 IMPLANT
GLOVE SURG TRIUMPH 8.0 PF LTX (GLOVE) ×2 IMPLANT
LENS IOL TECNIS EYHANCE 17.5 (Intraocular Lens) ×1 IMPLANT
NDL FILTER BLUNT 18X1 1/2 (NEEDLE) ×1 IMPLANT
NEEDLE FILTER BLUNT 18X 1/2SAF (NEEDLE) ×1
NEEDLE FILTER BLUNT 18X1 1/2 (NEEDLE) ×1 IMPLANT
PACK VIT ANT 23G (MISCELLANEOUS) IMPLANT
RING MALYGIN (MISCELLANEOUS) IMPLANT
SUT ETHILON 10-0 CS-B-6CS-B-6 (SUTURE)
SUTURE EHLN 10-0 CS-B-6CS-B-6 (SUTURE) IMPLANT
SYR 3ML LL SCALE MARK (SYRINGE) ×2 IMPLANT
WATER STERILE IRR 250ML POUR (IV SOLUTION) ×2 IMPLANT

## 2022-07-11 NOTE — Op Note (Signed)
PREOPERATIVE DIAGNOSIS:  Nuclear sclerotic cataract of the left eye.   POSTOPERATIVE DIAGNOSIS:  Nuclear sclerotic cataract of the left eye.   OPERATIVE PROCEDURE:ORPROCALL@   SURGEON:  Samantha Robson, MD.   ANESTHESIA:  Anesthesiologist: Iran Ouch, MD CRNA: Ester Rink, CRNA  1.      Managed anesthesia care. 2.     0.75m of Shugarcaine was instilled following the paracentesis   COMPLICATIONS:  None.   TECHNIQUE:   Stop and chop   DESCRIPTION OF PROCEDURE:  The patient was examined and consented in the preoperative holding area where the aforementioned topical anesthesia was applied to the left eye and then brought back to the Operating Room where the left eye was prepped and draped in the usual sterile ophthalmic fashion and a lid speculum was placed. A paracentesis was created with the side port blade and the anterior chamber was filled with viscoelastic. A near clear corneal incision was performed with the steel keratome. A continuous curvilinear capsulorrhexis was performed with a cystotome followed by the capsulorrhexis forceps. Hydrodissection and hydrodelineation were carried out with BSS on a blunt cannula. The lens was removed in a stop and chop  technique and the remaining cortical material was removed with the irrigation-aspiration handpiece. The capsular bag was inflated with viscoelastic and the Technis [id number] lens was placed in the capsular bag without complication. The remaining viscoelastic was removed from the eye with the irrigation-aspiration handpiece. The wounds were hydrated. The anterior chamber was flushed with BSS and the eye was inflated to physiologic pressure. 0.131mVigamox was placed in the anterior chamber. The wounds were found to be water tight. The eye was dressed with Combigan. The patient was given protective glasses to wear throughout the day and a shield with which to sleep tonight. The patient was also given drops with which to begin a drop  regimen today and will follow-up with me in one day. Implant Name Type Inv. Item Serial No. Manufacturer Lot No. LRB No. Used Action  LENS IOL TECNIS EYHANCE 17.5 - [id number] Lens LENS IOL TECNIS EYHANCE 17.5 [id number]  Left 1 Implanted    Procedure(s) with comments: CATARACT EXTRACTION PHACO AND INTRAOCULAR LENS PLACEMENT (IOC) LEFT  DIABETIC (Left) - 3.60 0:22.7  Electronically signed: WiBirder [name]/15/2023 10:16 AM

## 2022-07-11 NOTE — H&P (Signed)
[facility]   Primary Care Physician:  Lorrene Reid, PA-C Ophthalmologist: Dr. George Ina  Pre-Procedure History & Physical: HPI:  Samantha [name] is a 66 y.o. female here for cataract surgery.   Past Medical History:  Diagnosis Date   Arthritis    Dyslipidemia    Heart disease    Heart murmur 2011   Left bundle branch block 2011   Type II or unspecified type diabetes mellitus without mention of complication, not stated as uncontrolled    Unspecified essential hypertension     Past Surgical History:  Procedure Laterality Date   KNEE ARTHROSCOPY Right 2017   LEFT HEART CATH AND CORONARY ANGIOGRAPHY N/A 06/30/2021   Procedure: LEFT HEART CATH AND CORONARY ANGIOGRAPHY;  Surgeon: Troy Sine, MD;  Location: [facility];  Service: Cardiovascular;  Laterality: N/A;   TONSILLECTOMY  1962    Prior to Admission medications   Medication Sig Start Date End Date Taking? Authorizing Provider  aspirin 81 MG tablet Take 81 mg by mouth every morning.    Yes [provider]  azelastine (ASTELIN) 0.1 % nasal spray Place 1 spray into both nostrils daily as needed for allergies.    Yes [provider]  budesonide-formoterol (SYMBICORT) 80-4.5 MCG/ACT inhaler Inhale 2 puffs into the lungs 2 (two) times daily. 10/29/18  Yes Collene Gobble, MD  Calcium Citrate-Vitamin D (CALCIUM + D PO) Take 1 tablet by mouth every morning.    Yes [provider]  cetirizine (ZYRTEC) 10 MG tablet Take 10 mg by mouth daily.   Yes [provider]  diclofenac (VOLTAREN) 75 MG EC tablet Take 1 tablet (75 mg total) by mouth 2 (two) times daily. 04/12/22  Yes Abonza, Maritza, PA-C  diclofenac Sodium (VOLTAREN) 1 % GEL Apply 2 g topically 4 (four) times daily. 07/13/21  Yes Prosperi, Christian H, PA-C  FLUoxetine (PROZAC) 40 MG capsule Take 1 capsule (40 mg total) by mouth daily. 05/25/22  Yes Abonza, Maritza, PA-C  fluticasone (FLONASE) 50 MCG/ACT nasal spray Place 2  sprays into both nostrils daily. 11/12/20  Yes [name], PA-C  gabapentin (NEURONTIN) 300 MG capsule TAKE 1 CAPSULE BY MOUTH IN THE MORNING AND 2 IN THE EVENING 04/26/22  Yes Abonza, Maritza, PA-C  hydrochlorothiazide (MICROZIDE) 12.5 MG capsule Take 1 capsule by mouth once daily 06/27/22  Yes Abonza, Maritza, PA-C  Insulin Degludec FlexTouch 100 UNIT/ML SOPN INJECT 12 UNITS SUBCUTANEOUSLY ONCE DAILY 06/08/22  Yes Abonza, Maritza, PA-C  ipratropium-albuterol (DUONEB) 0.5-2.5 (3) MG/3ML SOLN Inhale 3 mLs into the lungs daily as needed (wheezing).  06/25/17  Yes [provider]  lovastatin (MEVACOR) 20 MG tablet TAKE 1 TABLET BY MOUTH AT BEDTIME 05/10/22  Yes Abonza, Maritza, PA-C  metFORMIN (GLUCOPHAGE) 1000 MG tablet TAKE 1 TABLET BY MOUTH TWICE DAILY WITH A MEAL 05/10/22  Yes Abonza, Maritza, PA-C  methocarbamol (ROBAXIN) 500 MG tablet Take 1 tablet (500 mg total) by mouth every 8 (eight) hours as needed for muscle spasms. 03/14/22  Yes Abonza, Maritza, PA-C  nitroGLYCERIN (NITROSTAT) 0.4 MG SL tablet Place 1 tablet (0.4 mg total) under the tongue every 5 (five) minutes as needed for chest pain. 06/29/21  Yes Nahser, Wonda Cheng, MD  Omega-3 Fatty Acids (FISH OIL) 1000 MG CAPS Take 1,000 mg by mouth every morning.    Yes [provider]  pantoprazole (PROTONIX) 40 MG tablet Take 1 tablet (40 mg total) by mouth daily. 07/26/21  Yes Abonza, Maritza, PA-C  valsartan (DIOVAN) 160 MG  tablet Take 1 tablet by mouth once daily 05/10/22  Yes Abonza, Maritza, PA-C  verapamil (CALAN-SR) 240 MG CR tablet TAKE 1 TABLET BY MOUTH ONCE DAILY IN THE MORNING 02/09/22  Yes Abonza, Maritza, PA-C  amoxicillin-clavulanate (AUGMENTIN) 875-125 MG tablet Take 1 tablet by mouth 2 (two) times daily. Patient not taking: Reported on 07/05/2022 06/14/22   Lorrene Reid, PA-C  dextromethorphan-guaiFENesin (MUCINEX DM) 30-600 MG 12hr tablet Take 1 tablet by mouth 2 (two) times daily. Patient not taking: Reported on 07/05/2022  06/14/22   Lorrene Reid, PA-C  methylPREDNISolone (MEDROL DOSEPAK) 4 MG TBPK tablet Take as directed Patient not taking: Reported on 07/05/2022 06/15/22   Abonza, Maritza, PA-C  OZEMPIC, 1 MG/DOSE, 4 MG/3ML SOPN INJECT 0.75 MLS (1 MG TOTAL) INTO THE SKIN ONCE A WEEK (USE ONLY ONCE LOWER DOSES ARE FINISHED) 04/21/21   Lorrene Reid, PA-C    Allergies as of 05/18/2022 - Review Complete 04/12/2022  Allergen Reaction Noted   Iohexol Anaphylaxis 08/24/2009   Iodinated contrast media Other (See Comments) 11/17/2019   Jardiance [empagliflozin] Rash 02/23/2020    Family History  Problem Relation Age of Onset   Diabetes Mother    Hypertension Mother    CAD Mother    Hypertension Father    Diabetes Father    CAD Father    Diabetes Brother    Hyperlipidemia Brother    Hypertension Brother    CAD Brother    Diabetes Sister    Hypertension Sister     Social History   Socioeconomic History   Marital status: Married    Spouse name: Not on file   Number of children: 1   Years of education: Not on file   Highest education level: Not on file  Occupational History   Not on file  Tobacco Use   Smoking status: Never   Smokeless tobacco: Never  Vaping Use   Vaping Use: Never used  Substance and Sexual Activity   Alcohol use: No   Drug use: No   Sexual activity: Not Currently  Other Topics Concern   Not on file  Social History Narrative   Not on file   Social Determinants of Health   Financial Resource Strain: Low Risk  (02/14/2022)   Overall Financial Resource Strain (CARDIA)    Difficulty of Paying Living Expenses: Not very hard  Food Insecurity: No Food Insecurity (02/14/2022)   Hunger Vital Sign    Worried About Running Out of Food in the Last Year: Never true    Ran Out of Food in the Last Year: Never true  Transportation Needs: No Transportation Needs (02/14/2022)   PRAPARE - Hydrologist (Medical): No    Lack of Transportation (Non-Medical):  No  Physical Activity: Insufficiently Active (02/14/2022)   Exercise Vital Sign    Days of Exercise per Week: 2 days    Minutes of Exercise per Session: 20 min  Stress: No Stress Concern Present (02/14/2022)   [place]    Feeling of Stress : Not at all  Social Connections: Moderately Integrated (02/14/2022)   Social Connection and Isolation Panel [NHANES]    Frequency of Communication with Friends and Family: More than three times a week    Frequency of Social Gatherings with Friends and Family: Three times a week    Attends Religious Services: More than 4 times per year    Active Member of Clubs or Organizations: No    Attends  Club or Organization Meetings: Never    Marital Status: Married  Human resources officer Violence: Not At Risk (02/14/2022)   Humiliation, Afraid, Rape, and Kick questionnaire    Fear of Current or Ex-Partner: No    Emotionally Abused: No    Physically Abused: No    Sexually Abused: No    Review of Systems: See HPI, otherwise negative ROS  Physical Exam: BP (!) 157/65   Pulse 88   Temp 97.7 F (36.5 C)   Resp 20   Ht '5\' 10"'$  (1.778 m)   Wt 99.8 kg   SpO2 97%   BMI 31.57 kg/m  General:   Alert, cooperative in NAD Head:  Normocephalic and atraumatic. Respiratory:  Normal work of breathing. Cardiovascular:  RRR  Impression/Plan: Samantha [name] is here for cataract surgery.  Risks, benefits, limitations, and alternatives regarding cataract surgery have been reviewed with the patient.  Questions have been answered.  All parties agreeable.   Birder Robson, MD  07/11/2022, 9:50 AM

## 2022-07-11 NOTE — Anesthesia Postprocedure Evaluation (Signed)
Anesthesia Post Note  Patient: Samantha [name]  Procedure(s) Performed: CATARACT EXTRACTION PHACO AND INTRAOCULAR LENS PLACEMENT (IOC) LEFT  DIABETIC (Left: Eye)     Patient location during evaluation: PACU Anesthesia Type: MAC Level of consciousness: awake and alert Pain management: pain level controlled Vital Signs Assessment: post-procedure vital signs reviewed and stable Respiratory status: spontaneous breathing, nonlabored ventilation and respiratory function stable Cardiovascular status: blood pressure returned to baseline and stable Postop Assessment: no apparent nausea or vomiting Anesthetic complications: no   There were no known notable events for this encounter.  [place] Ouch

## 2022-07-11 NOTE — Transfer of Care (Signed)
Immediate Anesthesia Transfer of Care Note  Patient: Samantha [name]  Procedure(s) Performed: CATARACT EXTRACTION PHACO AND INTRAOCULAR LENS PLACEMENT ([facility]) LEFT  DIABETIC (Left: Eye)  Patient Location: PACU  Anesthesia Type: MAC  Level of Consciousness: awake, alert  and patient cooperative  Airway and Oxygen Therapy: Patient Spontanous Breathing and Patient connected to supplemental oxygen  Post-op Assessment: Post-op Vital signs reviewed, Patient's Cardiovascular Status Stable, Respiratory Function Stable, Patent Airway and No signs of Nausea or vomiting  Post-op Vital Signs: Reviewed and stable  Complications: There were no known notable events for this encounter.

## 2022-07-12 ENCOUNTER — Encounter: Payer: Self-pay | Admitting: Ophthalmology

## 2022-07-12 ENCOUNTER — Other Ambulatory Visit: Payer: Self-pay

## 2022-07-12 LAB — GLUCOSE, CAPILLARY: Glucose-Capillary: 86 mg/dL (ref 70–99)

## 2022-07-17 DIAGNOSIS — H2511 Age-related nuclear cataract, right eye: Secondary | ICD-10-CM | POA: Diagnosis not present

## 2022-07-20 ENCOUNTER — Other Ambulatory Visit: Payer: Self-pay | Admitting: Physician Assistant

## 2022-07-20 DIAGNOSIS — M1990 Unspecified osteoarthritis, unspecified site: Secondary | ICD-10-CM

## 2022-07-24 NOTE — Discharge Instructions (Signed)

## 2022-07-25 ENCOUNTER — Ambulatory Visit: Payer: Medicare HMO | Admitting: Anesthesiology

## 2022-07-25 ENCOUNTER — Ambulatory Visit (AMBULATORY_SURGERY_CENTER): Payer: Medicare HMO | Admitting: Anesthesiology

## 2022-07-25 ENCOUNTER — Other Ambulatory Visit: Payer: Self-pay

## 2022-07-25 ENCOUNTER — Ambulatory Visit
Admission: RE | Admit: 2022-07-25 | Discharge: 2022-07-25 | Disposition: A | Discharge status: Home / Self Care | Payer: Medicare HMO | Attending: Ophthalmology | Admitting: Ophthalmology

## 2022-07-25 ENCOUNTER — Encounter
Admission: RE | Disposition: A | Discharge status: Home / Self Care | Payer: Self-pay | Source: Home / Self Care | Attending: Ophthalmology

## 2022-07-25 ENCOUNTER — Encounter: Payer: Self-pay | Admitting: Ophthalmology

## 2022-07-25 DIAGNOSIS — I209 Angina pectoris, unspecified: Secondary | ICD-10-CM | POA: Diagnosis not present

## 2022-07-25 DIAGNOSIS — I10 Essential (primary) hypertension: Secondary | ICD-10-CM | POA: Insufficient documentation

## 2022-07-25 DIAGNOSIS — E119 Type 2 diabetes mellitus without complications: Secondary | ICD-10-CM | POA: Diagnosis not present

## 2022-07-25 DIAGNOSIS — E1136 Type 2 diabetes mellitus with diabetic cataract: Secondary | ICD-10-CM | POA: Insufficient documentation

## 2022-07-25 DIAGNOSIS — K219 Gastro-esophageal reflux disease without esophagitis: Secondary | ICD-10-CM | POA: Diagnosis not present

## 2022-07-25 DIAGNOSIS — H2511 Age-related nuclear cataract, right eye: Secondary | ICD-10-CM | POA: Diagnosis not present

## 2022-07-25 HISTORY — PX: CATARACT EXTRACTION W/PHACO: SHX586

## 2022-07-25 LAB — GLUCOSE, CAPILLARY
Glucose-Capillary: 101 mg/dL — ABNORMAL HIGH (ref 70–99)
Glucose-Capillary: 110 mg/dL — ABNORMAL HIGH (ref 70–99)

## 2022-07-25 SURGERY — PHACOEMULSIFICATION, CATARACT, WITH IOL INSERTION
Anesthesia: Monitor Anesthesia Care | Site: Eye | Laterality: Right

## 2022-07-25 MED ORDER — TETRACAINE HCL 0.5 % OP SOLN
1.0000 [drp] | OPHTHALMIC | Status: DC | PRN
  Administered 2022-07-25 (×3): 1 [drp] via OPHTHALMIC

## 2022-07-25 MED ORDER — SIGHTPATH DOSE#1 BSS IO SOLN
INTRAOCULAR | Status: DC | PRN
  Administered 2022-07-25: 15 mL via INTRAOCULAR

## 2022-07-25 MED ORDER — SIGHTPATH DOSE#1 BSS IO SOLN
INTRAOCULAR | Status: DC | PRN
  Administered 2022-07-25: 46 mL via OPHTHALMIC

## 2022-07-25 MED ORDER — MOXIFLOXACIN HCL 0.5 % OP SOLN
OPHTHALMIC | Status: DC | PRN
  Administered 2022-07-25: 0.2 mL via OPHTHALMIC

## 2022-07-25 MED ORDER — MIDAZOLAM HCL 2 MG/2ML IJ SOLN
INTRAMUSCULAR | Status: DC | PRN
  Administered 2022-07-25: 1 mg via INTRAVENOUS

## 2022-07-25 MED ORDER — FENTANYL CITRATE (PF) 100 MCG/2ML IJ SOLN
INTRAMUSCULAR | Status: DC | PRN
  Administered 2022-07-25: 50 ug via INTRAVENOUS

## 2022-07-25 MED ORDER — SIGHTPATH DOSE#1 NA CHONDROIT SULF-NA HYALURON 40-17 MG/ML IO SOLN
INTRAOCULAR | Status: DC | PRN
  Administered 2022-07-25: 1 mL via INTRAOCULAR

## 2022-07-25 MED ORDER — FENTANYL CITRATE PF 50 MCG/ML IJ SOSY: 25.0000 ug | PREFILLED_SYRINGE | INTRAMUSCULAR | Status: DC | PRN

## 2022-07-25 MED ORDER — LACTATED RINGERS IV SOLN: INTRAVENOUS | Status: DC

## 2022-07-25 MED ORDER — ARMC OPHTHALMIC DILATING DROPS
1.0000 | OPHTHALMIC | Status: DC | PRN
  Administered 2022-07-25 (×3): 1 via OPHTHALMIC

## 2022-07-25 MED ORDER — BRIMONIDINE TARTRATE-TIMOLOL 0.2-0.5 % OP SOLN
OPHTHALMIC | Status: DC | PRN
  Administered 2022-07-25: 1 [drp] via OPHTHALMIC

## 2022-07-25 MED ORDER — SIGHTPATH DOSE#1 BSS IO SOLN
INTRAOCULAR | Status: DC | PRN
  Administered 2022-07-25: 2 mL

## 2022-07-25 MED ORDER — ONDANSETRON HCL 4 MG/2ML IJ SOLN: 4.0000 mg | Freq: Once | INTRAMUSCULAR | Status: DC | PRN

## 2022-07-25 SURGICAL SUPPLY — 12 items
CANNULA ANT/CHMB 27G (MISCELLANEOUS) IMPLANT
CANNULA ANT/CHMB 27GA (MISCELLANEOUS) IMPLANT
CATARACT SUITE SIGHTPATH (MISCELLANEOUS) ×1 IMPLANT
FEE CATARACT SUITE SIGHTPATH (MISCELLANEOUS) ×1 IMPLANT
GLOVE SURG ENC TEXT LTX SZ8 (GLOVE) ×1 IMPLANT
GLOVE SURG TRIUMPH 8.0 PF LTX (GLOVE) ×1 IMPLANT
LENS IOL TECNIS EYHANCE 18.0 (Intraocular Lens) IMPLANT
NDL FILTER BLUNT 18X1 1/2 (NEEDLE) ×1 IMPLANT
NEEDLE FILTER BLUNT 18X 1/2SAF (NEEDLE) ×1
NEEDLE FILTER BLUNT 18X1 1/2 (NEEDLE) ×1 IMPLANT
SYR 3ML LL SCALE MARK (SYRINGE) ×1 IMPLANT
WATER STERILE IRR 250ML POUR (IV SOLUTION) ×1 IMPLANT

## 2022-07-25 NOTE — Op Note (Signed)
PREOPERATIVE DIAGNOSIS:  Nuclear sclerotic cataract of the right eye.   POSTOPERATIVE DIAGNOSIS:  Cataract   OPERATIVE PROCEDURE:ORPROCALL@   SURGEON:  Birder Robson, MD.   ANESTHESIA:  Anesthesiologist: Molli Barrows, MD CRNA: Ester Rink, CRNA  1.      Managed anesthesia care. 2.      0.16m of Shugarcaine was instilled in the eye following the paracentesis.   COMPLICATIONS:  None.   TECHNIQUE:   Stop and chop   DESCRIPTION OF PROCEDURE:  The patient was examined and consented in the preoperative holding area where the aforementioned topical anesthesia was applied to the right eye and then brought back to the Operating Room where the right eye was prepped and draped in the usual sterile ophthalmic fashion and a lid speculum was placed. A paracentesis was created with the side port blade and the anterior chamber was filled with viscoelastic. A near clear corneal incision was performed with the steel keratome. A continuous curvilinear capsulorrhexis was performed with a cystotome followed by the capsulorrhexis forceps. Hydrodissection and hydrodelineation were carried out with BSS on a blunt cannula. The lens was removed in a stop and chop  technique and the remaining cortical material was removed with the irrigation-aspiration handpiece. The capsular bag was inflated with viscoelastic and the Technis ZCB00  lens was placed in the capsular bag without complication. The remaining viscoelastic was removed from the eye with the irrigation-aspiration handpiece. The wounds were hydrated. The anterior chamber was flushed with BSS and the eye was inflated to physiologic pressure. 0.122mof Vigamox was placed in the anterior chamber. The wounds were found to be water tight. The eye was dressed with Combigan. The patient was given protective glasses to wear throughout the day and a shield with which to sleep tonight. The patient was also given drops with which to begin a drop regimen today and will  follow-up with me in one day. Implant Name Type Inv. Item Serial No. Manufacturer Lot No. LRB No. Used Action  LENS IOL TECNIS EYHANCE 18.0 - [id number] Lens LENS IOL TECNIS EYHANCE 18.0 [id number]  Right 1 Implanted   Procedure(s): CATARACT EXTRACTION PHACO AND INTRAOCULAR LENS PLACEMENT (IOC) RIGHT DIABETIC 5.12 00:33.0 (Right)  Electronically signed: WiBirder Robson/29/2023 7:49 AM

## 2022-07-25 NOTE — Addendum Note (Signed)
Addendum  created 07/25/22 0936 by Ester Rink, CRNA   Intraprocedure Event edited

## 2022-07-25 NOTE — Anesthesia Postprocedure Evaluation (Signed)
Anesthesia Post Note  Patient: Samantha [name]  Procedure(s) Performed: CATARACT EXTRACTION PHACO AND INTRAOCULAR LENS PLACEMENT (IOC) RIGHT DIABETIC 5.12 00:33.0 (Right: Eye)     Patient location during evaluation: PACU Anesthesia Type: MAC Level of consciousness: awake and alert Pain management: pain level controlled Vital Signs Assessment: post-procedure vital signs reviewed and stable Respiratory status: spontaneous breathing, nonlabored ventilation, respiratory function stable and patient connected to nasal cannula oxygen Cardiovascular status: stable and blood pressure returned to baseline Postop Assessment: no apparent nausea or vomiting Anesthetic complications: no   There were no known notable events for this encounter.  Molli Barrows

## 2022-07-25 NOTE — H&P (Signed)
[facility]   Primary Care Physician:  Lorrene Reid, PA-C Ophthalmologist: Dr. George Ina  Pre-Procedure History & Physical: HPI:  Samantha [name] is a 66 y.o. female here for cataract surgery.   Past Medical History:  Diagnosis Date   Arthritis    Dyslipidemia    Heart disease    Heart murmur 2011   Left bundle branch block 2011   Type II or unspecified type diabetes mellitus without mention of complication, not stated as uncontrolled    Unspecified essential hypertension     Past Surgical History:  Procedure Laterality Date   CATARACT EXTRACTION W/PHACO Left 07/11/2022   Procedure: CATARACT EXTRACTION PHACO AND INTRAOCULAR LENS PLACEMENT ([place]) LEFT  DIABETIC;  Surgeon: Birder Robson, MD;  Location: [place];  Service: Ophthalmology;  Laterality: Left;  3.60 0:22.7   KNEE ARTHROSCOPY Right 2017   LEFT HEART CATH AND CORONARY ANGIOGRAPHY N/A 06/30/2021   Procedure: LEFT HEART CATH AND CORONARY ANGIOGRAPHY;  Surgeon: Troy Sine, MD;  Location: [facility];  Service: Cardiovascular;  Laterality: N/A;   TONSILLECTOMY  1962    Prior to Admission medications   Medication Sig Start Date End Date Taking? Authorizing Provider  aspirin 81 MG tablet Take 81 mg by mouth every morning.    Yes [provider]  budesonide-formoterol (SYMBICORT) 80-4.5 MCG/ACT inhaler Inhale 2 puffs into the lungs 2 (two) times daily. 10/29/18  Yes Collene Gobble, MD  Calcium Citrate-Vitamin D (CALCIUM + D PO) Take 1 tablet by mouth every morning.    Yes [provider]  cetirizine (ZYRTEC) 10 MG tablet Take 10 mg by mouth daily.   Yes [provider]  diclofenac (VOLTAREN) 75 MG EC tablet Take 1 tablet by mouth twice daily 07/21/22  Yes Abonza, Maritza, PA-C  FLUoxetine (PROZAC) 40 MG capsule Take 1 capsule (40 mg total) by mouth daily. 05/25/22  Yes Abonza, Maritza, PA-C  fluticasone (FLONASE) 50 MCG/ACT nasal spray Place 2 sprays into both  nostrils daily. 11/12/20  Yes [name], PA-C  gabapentin (NEURONTIN) 300 MG capsule TAKE 1 CAPSULE BY MOUTH IN THE MORNING AND 2 IN THE EVENING 04/26/22  Yes Abonza, Maritza, PA-C  hydrochlorothiazide (MICROZIDE) 12.5 MG capsule Take 1 capsule by mouth once daily 06/27/22  Yes Abonza, Maritza, PA-C  ipratropium-albuterol (DUONEB) 0.5-2.5 (3) MG/3ML SOLN Inhale 3 mLs into the lungs daily as needed (wheezing).  06/25/17  Yes [provider]  lovastatin (MEVACOR) 20 MG tablet TAKE 1 TABLET BY MOUTH AT BEDTIME 05/10/22  Yes Abonza, Maritza, PA-C  metFORMIN (GLUCOPHAGE) 1000 MG tablet TAKE 1 TABLET BY MOUTH TWICE DAILY WITH A MEAL 05/10/22  Yes Abonza, Maritza, PA-C  methocarbamol (ROBAXIN) 500 MG tablet Take 1 tablet (500 mg total) by mouth every 8 (eight) hours as needed for muscle spasms. 03/14/22  Yes Abonza, Herb Grays, PA-C  Omega-3 Fatty Acids (FISH OIL) 1000 MG CAPS Take 1,000 mg by mouth every morning.    Yes [provider]  pantoprazole (PROTONIX) 40 MG tablet Take 1 tablet (40 mg total) by mouth daily. 07/26/21  Yes Lorrene Reid, PA-C  valsartan (DIOVAN) 160 MG tablet Take 1 tablet by mouth once daily 05/10/22  Yes Abonza, Maritza, PA-C  verapamil (CALAN-SR) 240 MG CR tablet TAKE 1 TABLET BY MOUTH ONCE DAILY IN THE MORNING 02/09/22  Yes Abonza, Maritza, PA-C  amoxicillin-clavulanate (AUGMENTIN) 875-125 MG tablet Take 1 tablet by mouth 2 (two) times daily. Patient not taking: Reported on 07/05/2022 06/14/22   Lorrene Reid,  PA-C  azelastine (ASTELIN) 0.1 % nasal spray Place 1 spray into both nostrils daily as needed for allergies.     [provider]  dextromethorphan-guaiFENesin (MUCINEX DM) 30-600 MG 12hr tablet Take 1 tablet by mouth 2 (two) times daily. Patient not taking: Reported on 07/05/2022 06/14/22   Lorrene Reid, PA-C  diclofenac Sodium (VOLTAREN) 1 % GEL Apply 2 g topically 4 (four) times daily. 07/13/21   Prosperi, Christian H, PA-C  Insulin Degludec FlexTouch  100 UNIT/ML SOPN INJECT 12 UNITS SUBCUTANEOUSLY ONCE DAILY 06/08/22   Lorrene Reid, PA-C  methylPREDNISolone (MEDROL DOSEPAK) 4 MG TBPK tablet Take as directed Patient not taking: Reported on 07/05/2022 06/15/22   Lorrene Reid, PA-C  nitroGLYCERIN (NITROSTAT) 0.4 MG SL tablet Place 1 tablet (0.4 mg total) under the tongue every 5 (five) minutes as needed for chest pain. 06/29/21   Nahser, Wonda Cheng, MD  OZEMPIC, 1 MG/DOSE, 4 MG/3ML SOPN INJECT 0.75 MLS (1 MG TOTAL) INTO THE SKIN ONCE A WEEK (USE ONLY ONCE LOWER DOSES ARE FINISHED) 04/21/21   Lorrene Reid, PA-C    Allergies as of 05/18/2022 - Review Complete 04/12/2022  Allergen Reaction Noted   Iohexol Anaphylaxis 08/24/2009   Iodinated contrast media Other (See Comments) 11/17/2019   Jardiance [empagliflozin] Rash 02/23/2020    Family History  Problem Relation Age of Onset   Diabetes Mother    Hypertension Mother    CAD Mother    Hypertension Father    Diabetes Father    CAD Father    Diabetes Brother    Hyperlipidemia Brother    Hypertension Brother    CAD Brother    Diabetes Sister    Hypertension Sister     Social History   Socioeconomic History   Marital status: Married    Spouse name: Not on file   Number of children: 1   Years of education: Not on file   Highest education level: Not on file  Occupational History   Not on file  Tobacco Use   Smoking status: Never   Smokeless tobacco: Never  Vaping Use   Vaping Use: Never used  Substance and Sexual Activity   Alcohol use: No   Drug use: No   Sexual activity: Not Currently  Other Topics Concern   Not on file  Social History Narrative   Not on file   Social Determinants of Health   Financial Resource Strain: Low Risk  (02/14/2022)   Overall Financial Resource Strain (CARDIA)    Difficulty of Paying Living Expenses: Not very hard  Food Insecurity: No Food Insecurity (02/14/2022)   Hunger Vital Sign    Worried About Running Out of Food in the Last Year:  Never true    Ran Out of Food in the Last Year: Never true  Transportation Needs: No Transportation Needs (02/14/2022)   PRAPARE - Hydrologist (Medical): No    Lack of Transportation (Non-Medical): No  Physical Activity: Insufficiently Active (02/14/2022)   Exercise Vital Sign    Days of Exercise per Week: 2 days    Minutes of Exercise per Session: 20 min  Stress: No Stress Concern Present (02/14/2022)   [place]    Feeling of Stress : Not at all  Social Connections: Moderately Integrated (02/14/2022)   Social Connection and Isolation Panel [NHANES]    Frequency of Communication with Friends and Family: More than three times a week    Frequency of Social  Gatherings with Friends and Family: Three times a week    Attends Religious Services: More than 4 times per year    Active Member of Clubs or Organizations: No    Attends Archivist Meetings: Never    Marital Status: Married  Human resources officer Violence: Not At Risk (02/14/2022)   Humiliation, Afraid, Rape, and Kick questionnaire    Fear of Current or Ex-Partner: No    Emotionally Abused: No    Physically Abused: No    Sexually Abused: No    Review of Systems: See HPI, otherwise negative ROS  Physical Exam: BP (!) 161/60   Pulse 74   Temp (!) 97.3 F (36.3 C) (Temporal)   Wt 98.9 kg   SpO2 94%   BMI 31.28 kg/m  General:   Alert, cooperative in NAD Head:  Normocephalic and atraumatic. Respiratory:  Normal work of breathing. Cardiovascular:  RRR  Impression/Plan: Samantha [name] is here for cataract surgery.  Risks, benefits, limitations, and alternatives regarding cataract surgery have been reviewed with the patient.  Questions have been answered.  All parties agreeable.   Birder Robson, MD  07/25/2022, 7:19 AM

## 2022-07-25 NOTE — Transfer of Care (Signed)
Immediate Anesthesia Transfer of Care Note  Patient: Samantha [name]  Procedure(s) Performed: CATARACT EXTRACTION PHACO AND INTRAOCULAR LENS PLACEMENT (IOC) RIGHT DIABETIC 5.12 00:33.0 (Right: Eye)  Patient Location: PACU  Anesthesia Type: MAC  Level of Consciousness: awake, alert  and patient cooperative  Airway and Oxygen Therapy: Patient Spontanous Breathing and Patient connected to supplemental oxygen  Post-op Assessment: Post-op Vital signs reviewed, Patient's Cardiovascular Status Stable, Respiratory Function Stable, Patent Airway and No signs of Nausea or vomiting  Post-op Vital Signs: Reviewed and stable  Complications: There were no known notable events for this encounter.

## 2022-07-25 NOTE — Anesthesia Preprocedure Evaluation (Signed)
Anesthesia Evaluation  Patient identified by MRN, date of birth, ID band Patient awake    Reviewed: Allergy & Precautions, H&P , NPO status , Patient's Chart, lab work & pertinent test results, reviewed documented beta blocker date and time   Airway Mallampati: II  TM Distance: >3 FB Neck ROM: full    Dental no notable dental hx. (+) Teeth Intact   Pulmonary shortness of breath and with exertion,    Pulmonary exam normal breath sounds clear to auscultation       Cardiovascular Exercise Tolerance: Poor hypertension, + angina with exertion + dysrhythmias + Valvular Problems/Murmurs  Rhythm:regular Rate:Normal     Neuro/Psych PSYCHIATRIC DISORDERS  Neuromuscular disease    GI/Hepatic Neg liver ROS, GERD  Medicated,  Endo/Other  negative endocrine ROSdiabetes  Renal/GU      Musculoskeletal   Abdominal   Peds  Hematology negative hematology ROS (+)   Anesthesia Other Findings   Reproductive/Obstetrics negative OB ROS                             Anesthesia Physical Anesthesia Plan  ASA: 3  Anesthesia Plan: MAC   Post-op Pain Management:    Induction:   PONV Risk Score and Plan:   Airway Management Planned:   Additional Equipment:   Intra-op Plan:   Post-operative Plan:   Informed Consent: I have reviewed the patients History and Physical, chart, labs and discussed the procedure including the risks, benefits and alternatives for the proposed anesthesia with the patient or authorized representative who has indicated his/her understanding and acceptance.       Plan Discussed with: CRNA  Anesthesia Plan Comments:         Anesthesia Quick Evaluation

## 2022-07-26 ENCOUNTER — Encounter: Payer: Self-pay | Admitting: Ophthalmology

## 2022-08-03 ENCOUNTER — Ambulatory Visit
Admission: RE | Admit: 2022-08-03 | Discharge: 2022-08-03 | Disposition: A | Discharge status: Home / Self Care | Payer: Medicare HMO | Source: Ambulatory Visit | Attending: Physician Assistant | Admitting: Physician Assistant

## 2022-08-03 DIAGNOSIS — E2839 Other primary ovarian failure: Secondary | ICD-10-CM

## 2022-08-03 DIAGNOSIS — Z1231 Encounter for screening mammogram for malignant neoplasm of breast: Secondary | ICD-10-CM | POA: Diagnosis not present

## 2022-08-03 DIAGNOSIS — Z780 Asymptomatic menopausal state: Secondary | ICD-10-CM | POA: Diagnosis not present

## 2022-08-03 DIAGNOSIS — M85852 Other specified disorders of bone density and structure, left thigh: Secondary | ICD-10-CM | POA: Diagnosis not present

## 2022-08-14 ENCOUNTER — Other Ambulatory Visit: Payer: Self-pay | Admitting: Physician Assistant

## 2022-08-18 DIAGNOSIS — Z0100 Encounter for examination of eyes and vision without abnormal findings: Secondary | ICD-10-CM | POA: Diagnosis not present

## 2022-08-28 ENCOUNTER — Other Ambulatory Visit: Payer: Self-pay | Admitting: Physician Assistant

## 2022-08-28 DIAGNOSIS — E1169 Type 2 diabetes mellitus with other specified complication: Secondary | ICD-10-CM

## 2022-08-31 DIAGNOSIS — Z961 Presence of intraocular lens: Secondary | ICD-10-CM | POA: Diagnosis not present

## 2022-09-06 DIAGNOSIS — M170 Bilateral primary osteoarthritis of knee: Secondary | ICD-10-CM | POA: Diagnosis not present

## 2022-09-25 ENCOUNTER — Ambulatory Visit (INDEPENDENT_AMBULATORY_CARE_PROVIDER_SITE_OTHER): Payer: Medicare HMO | Admitting: Physician Assistant

## 2022-09-25 ENCOUNTER — Encounter: Payer: Self-pay | Admitting: Physician Assistant

## 2022-09-25 VITALS — BP 115/69 | HR 76 | Ht 70.0 in | Wt 217.0 lb

## 2022-09-25 DIAGNOSIS — E1140 Type 2 diabetes mellitus with diabetic neuropathy, unspecified: Secondary | ICD-10-CM | POA: Diagnosis not present

## 2022-09-25 DIAGNOSIS — E1169 Type 2 diabetes mellitus with other specified complication: Secondary | ICD-10-CM

## 2022-09-25 DIAGNOSIS — Z794 Long term (current) use of insulin: Secondary | ICD-10-CM

## 2022-09-25 DIAGNOSIS — F39 Unspecified mood [affective] disorder: Secondary | ICD-10-CM

## 2022-09-25 DIAGNOSIS — E785 Hyperlipidemia, unspecified: Secondary | ICD-10-CM

## 2022-09-25 DIAGNOSIS — M792 Neuralgia and neuritis, unspecified: Secondary | ICD-10-CM

## 2022-09-25 LAB — POCT GLYCOSYLATED HEMOGLOBIN (HGB A1C): HbA1c POC (<> result, manual entry): 6.2 % (ref 4.0–5.6)

## 2022-09-25 NOTE — Assessment & Plan Note (Signed)
-  A1c today 6.2, controlled and at goal <7.0. Will continue with Metformin 1000 mg twice daily, Ozempic 1 mg once a week and insulin degludec 12 units daily. Continue with ambulatory glucose monitoring and low carbohydrate/glucose diet. Unable to collect urine microalbumin today, recommend collecting at follow-up visit.

## 2022-09-25 NOTE — Assessment & Plan Note (Signed)
-  Improved. PHQ-9 score of 1. Continue with Prozac 40 mg daily.

## 2022-09-25 NOTE — Assessment & Plan Note (Signed)
>>  ASSESSMENT AND PLAN FOR DIABETES WRITTEN ON 09/25/2022 10:36 AM BY ABONZA, MARITZA, PA-C  -A1c today 6.2, controlled and at goal <7.0. Will continue with Metformin 1000 mg twice daily, Ozempic 1 mg once a week and insulin degludec 12 units daily. Continue with ambulatory glucose monitoring and low carbohydrate/glucose diet. Unable to collect urine microalbumin today, recommend collecting at follow-up visit.

## 2022-09-25 NOTE — Assessment & Plan Note (Signed)
-  Controlled. Last LDL 56. Continue current medication regimen.

## 2022-09-25 NOTE — Progress Notes (Signed)
Established patient visit   Patient: Samantha [name]   DOB: 1956-08-11   66 y.o. Female  MRN: [id number] Visit Date: 09/25/2022  Chief Complaint  Patient presents with   Follow-up   Subjective    HPI  Patient presents for chronic follow-up visit.  Diabetes mellitus: Pt denies increased urination or thirst. Pt reports medication compliance. No hypoglycemic events. Checking glucose at home. FBS range from 110s-120s. Denies diet changes. Continues to limit sugar intake.   Neuropathy: Patient on gabapentin to help with neuropathic pain.  HLD: Pt taking lovastatin 20 mg daily. No changes with her diet.  Mood: Patient reports increased dose of Prozac has helped with feeling less depressed. Tolerating medication without issues.      09/25/2022   10:16 AM 06/14/2022   11:22 AM 05/25/2022   10:10 AM 04/12/2022    1:57 PM 03/14/2022    3:27 PM  Depression screen PHQ 2/9  Decreased Interest 0 0 0 1 1  Down, Depressed, Hopeless 0 0 1 0 0  PHQ - 2 Score 0 0 _0 Altered sleeping 0 0 2 0 0  Tired, decreased energy 1 0 _1 Change in appetite 0 0 1 1 0  Feeling bad or failure about yourself  0 0 1 0 0  Trouble concentrating 0 0 2 0 1  Moving slowly or fidgety/restless 0 0 2 0 0  Suicidal thoughts 0 0 0 0 0  PHQ-9 Score 1 0 _2 Difficult doing work/chores  Not difficult at all Somewhat difficult Not difficult at all Somewhat difficult      09/25/2022   10:17 AM 06/14/2022   11:22 AM 05/25/2022   10:10 AM 04/12/2022    1:58 PM  GAD 7 : Generalized Anxiety Score  Nervous, Anxious, on Edge 0 0 0 0  Control/stop worrying 0 0 0 0  Worry too much - different things 1 0 2 0  Trouble relaxing 0 0 0 0  Restless 0 0 0 0  Easily annoyed or irritable 0 0 0 1  Afraid - awful might happen 0 0 0 0  Total GAD 7 Score 1 0 2 1  Anxiety Difficulty  Not difficult at all Somewhat difficult Not difficult at all       Medications: Outpatient Medications Prior to Visit  Medication Sig    aspirin 81 MG tablet Take 81 mg by mouth every morning.    azelastine (ASTELIN) 0.1 % nasal spray Place 1 spray into both nostrils daily as needed for allergies.    budesonide-formoterol (SYMBICORT) 80-4.5 MCG/ACT inhaler Inhale 2 puffs into the lungs 2 (two) times daily.   Calcium Citrate-Vitamin D (CALCIUM + D PO) Take 1 tablet by mouth every morning.    cetirizine (ZYRTEC) 10 MG tablet Take 10 mg by mouth daily.   diclofenac Sodium (VOLTAREN) 1 % GEL Apply 2 g topically 4 (four) times daily.   FLUoxetine (PROZAC) 40 MG capsule Take 1 capsule (40 mg total) by mouth daily.   fluticasone (FLONASE) 50 MCG/ACT nasal spray Place 2 sprays into both nostrils daily.   gabapentin (NEURONTIN) 300 MG capsule TAKE 1 CAPSULE BY MOUTH IN THE MORNING AND 2 IN THE EVENING   hydrochlorothiazide (MICROZIDE) 12.5 MG capsule Take 1 capsule by mouth once daily   Insulin Degludec FlexTouch 100 UNIT/ML SOPN INJECT 12 UNITS SUBCUTANEOUSLY ONCE DAILY   ipratropium-albuterol (DUONEB) 0.5-2.5 (3) MG/3ML SOLN Inhale 3 mLs into the lungs daily as needed (wheezing).  lovastatin (MEVACOR) 20 MG tablet TAKE 1 TABLET BY MOUTH AT BEDTIME   metFORMIN (GLUCOPHAGE) 1000 MG tablet TAKE 1 TABLET BY MOUTH TWICE DAILY WITH A MEAL   methocarbamol (ROBAXIN) 500 MG tablet Take 1 tablet (500 mg total) by mouth every 8 (eight) hours as needed for muscle spasms.   nitroGLYCERIN (NITROSTAT) 0.4 MG SL tablet Place 1 tablet (0.4 mg total) under the tongue every 5 (five) minutes as needed for chest pain.   Omega-3 Fatty Acids (FISH OIL) 1000 MG CAPS Take 1,000 mg by mouth every morning.    OZEMPIC, 1 MG/DOSE, 4 MG/3ML SOPN INJECT 0.75 MLS (1 MG TOTAL) INTO THE SKIN ONCE A WEEK (USE ONLY ONCE LOWER DOSES ARE FINISHED)   pantoprazole (PROTONIX) 40 MG tablet Take 1 tablet (40 mg total) by mouth daily.   valsartan (DIOVAN) 160 MG tablet Take 1 tablet by mouth once daily   verapamil (CALAN-SR) 240 MG CR tablet TAKE 1 TABLET BY MOUTH ONCE DAILY  IN THE MORNING   [DISCONTINUED] amoxicillin-clavulanate (AUGMENTIN) 875-125 MG tablet Take 1 tablet by mouth 2 (two) times daily. (Patient not taking: Reported on 07/05/2022)   [DISCONTINUED] dextromethorphan-guaiFENesin (MUCINEX DM) 30-600 MG 12hr tablet Take 1 tablet by mouth 2 (two) times daily. (Patient not taking: Reported on 07/05/2022)   [DISCONTINUED] diclofenac (VOLTAREN) 75 MG EC tablet Take 1 tablet by mouth twice daily   [DISCONTINUED] methylPREDNISolone (MEDROL DOSEPAK) 4 MG TBPK tablet Take as directed (Patient not taking: Reported on 07/05/2022)   Facility-Administered Medications Prior to Visit  Medication Dose Route Frequency Provider   nitroGLYCERIN (NITROSTAT) SL tablet 0.4 mg  0.4 mg Sublingual Q5 min PRN Ishaaq Penna, PA-C    Review of Systems Review of Systems:  A fourteen system review of systems was performed and found to be positive as per HPI.  Last CBC Lab Results  Component Value Date   WBC 8.2 05/18/2022   HGB 11.6 05/18/2022   HCT 34.7 05/18/2022   MCV 84 05/18/2022   MCH 28.0 05/18/2022   RDW 12.0 05/18/2022   PLT 328 51/12/5850   Last metabolic panel Lab Results  Component Value Date   GLUCOSE 190 (H) 05/18/2022   NA 139 05/18/2022   K 4.4 05/18/2022   CL 99 05/18/2022   CO2 23 05/18/2022   BUN 24 05/18/2022   CREATININE 1.30 (H) 05/18/2022   EGFR 45 (L) 05/18/2022   CALCIUM 9.8 05/18/2022   PHOS 3.2 05/12/2010   PROT 6.6 05/18/2022   ALBUMIN 4.2 05/18/2022   LABGLOB 2.4 05/18/2022   AGRATIO 1.8 05/18/2022   BILITOT 0.3 05/18/2022   ALKPHOS 68 05/18/2022   AST 20 05/18/2022   ALT 17 05/18/2022   ANIONGAP 10 08/01/2021   Last lipids Lab Results  Component Value Date   CHOL 138 05/18/2022   HDL 58 05/18/2022   LDLCALC 56 05/18/2022   TRIG 143 05/18/2022   CHOLHDL 2.4 05/18/2022   Last hemoglobin A1c Lab Results  Component Value Date   HGBA1C 6.2 09/25/2022   Last thyroid functions Lab Results  Component Value Date   TSH  2.200 07/06/2022   T3TOTAL 97 07/06/2022   T4TOTAL 5.2 07/06/2022       Objective    BP 115/69   Pulse 76   Ht _0  (1.778 m)   Wt 217 lb (98.4 kg)   SpO2 96%   BMI 31.14 kg/m  BP Readings from Last 3 Encounters:  09/25/22 115/69  07/25/22 (!) 145/61  07/11/22 137/67  Wt Readings from Last 3 Encounters:  09/25/22 217 lb (98.4 kg)  07/25/22 218 lb (98.9 kg)  07/11/22 220 lb (99.8 kg)    Physical Exam  General:  Pleasant and cooperative, appropriate for stated age.  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 Cardiac:  RRR, S1 S2 Respiratory: CTA B/L  Vascular:  Ext warm, no cyanosis apprec.; cap RF less 2 sec. Psych:  No HI/SI, judgement and insight good, Euthymic mood. Full Affect.   Results for orders placed or performed in visit on 09/25/22  POCT glycosylated hemoglobin (Hb A1C)  Result Value Ref Range   Hemoglobin A1C     HbA1c POC (<> result, manual entry) 6.2 4.0 - 5.6 %   HbA1c, POC (prediabetic range)     HbA1c, POC (controlled diabetic range)      Assessment & Plan      Problem List Items Addressed This Visit       Endocrine   Diabetes ([place]) - Primary    -A1c today 6.2, controlled and at goal <7.0. Will continue with Metformin 1000 mg twice daily, Ozempic 1 mg once a week and insulin degludec 12 units daily. Continue with ambulatory glucose monitoring and low carbohydrate/glucose diet. Unable to collect urine microalbumin today, recommend collecting at follow-up visit.      Relevant Orders   POCT glycosylated hemoglobin (Hb A1C) (Completed)   Hyperlipidemia associated with type 2 diabetes mellitus ([place])    -Controlled. Last LDL 56. Continue current medication regimen.       Relevant Orders   POCT glycosylated hemoglobin (Hb A1C) (Completed)     Other   Mood  disorder ([place])    -Improved. PHQ-9 score of 1. Continue with Prozac 40 mg daily.       Other Visit Diagnoses     Neuropathic pain          Neuropathic pain: -Stable. Recommend to continue with Gabapentin 300 mg as prescribed. Patient also has hx of cervical radiculopathy.   Return in about 4 months (around 01/25/2023) for DM, HTN, mood, med management.        Lorrene Reid, PA-C  [facility] at [facility] [phone] (phone) [phone] (fax)  [place]

## 2022-09-25 NOTE — Patient Instructions (Signed)
Diabetes Mellitus and Skin Care Diabetes, also called diabetes mellitus, can lead to skin problems. If blood sugar (glucose) is not well controlled, it can cause problems over time. These problems include: Damage to nerves. This can affect your ability to feel wounds. This means you may not notice small skin injuries that could lead to bigger problems. This can also decrease the amount that you sweat, causing dry skin. Damage to blood vessels. The lack of blood flow can cause skin to break down. It can also slow healing time, which can lead to infections. Areas of skin that become thick or discolored. Common skin conditions There are certain skin conditions that often affect people with diabetes. These include: Dry skin. Thin skin. The skin on the feet may get thinner, break more easily, and heal more slowly than normal. Skin infections from bacteria. These include: Styes. These are infections near the eyelid. Boils. These are bumps filled with pus. Infected hair follicles. Infections of the skin around the nails. Fungal skin infections. These are most common in areas where skin rubs together, such as in the armpits or under the breasts. Common skin changes Diabetes can also cause the skin to change. You may develop: Dark, velvety markings on your skin. These may appear on your face, neck, armpits, inner thighs, and groin. Red, raised, scar-like tissue that may itch, feel painful, or become a wound. Blisters on your feet, toes, hands, or fingers. Thick, wax-like areas of skin. In most cases, these occur on the hands, forehead, or toes. Brown or red, ring-shaped or half-ring-shaped patches of skin on the ears or fingers. Pea-shaped, yellow bumps that may be itchy and have a red ring around them. This may affect your arms, feet, buttocks, and the top of your hands. Round, discolored patches of tan skin that do not hurt or itch. These may look like age spots. Supplies needed: Mild soap or  gentle skin cleanser. Lotion. How to care for dry, itchy skin Frequent high glucose levels can cause skin to become itchy. Poor blood circulation and skin infections can make dry skin worse. If you have dry, itchy skin: Avoid very hot showers and baths. Use mild soap and gentle skin cleansers. Do not use soap that is perfumed, harsh, or that dries your skin. Moisturizing soaps may help. Put on moisturizing lotion as soon as you finish bathing. Do not scratch dry skin. Scratching can expose skin to infection. If you have a rash or if your skin is very itchy, contact your health care provider. Skin that is red or covered in a rash may be a sign of an allergic reaction. Very itchy skin may mean that you need help to manage your diabetes better. You may also need treatment for an infection. General tips Most skin problems can be prevented or treated easily if caught early. Talk with your health care provider if you have any concerns. General tips include: Check your skin every day for cuts, bruises, redness, blisters, or sores, especially on your feet. If you cannot see the bottom of your feet, use a mirror or ask someone for help. Tell your health care provider if you have any of these injuries and if they are healing slowly. Keep your skin clean and dry. Do not use hot water. Moisturize your skin to prevent chapping. Keep your blood glucose levels within target range. Follow these instructions at home:  Take over-the-counter and prescription medicines only as told by your health care provider. This includes all diabetes medicines  you are taking. Schedule a foot exam with your health care provider once a year. During the exam, the structure and skin of your feet will be checked for problems. Make sure that your health care provider does a visual foot exam at every visit. If you get a skin injury, such as a cut, blister, or sore, check the area every day for signs of infection. Check for: Redness,  swelling, or pain. Fluid or blood. Warmth. Pus or a bad smell. Do not use any products that contain nicotine or tobacco. These products include cigarettes, chewing tobacco, and vaping devices, such as e-cigarettes. If you need help quitting, ask your health care provider. Where to find more information American Diabetes Association: diabetes.org Association of Diabetes Care & Education Specialists: diabeteseducator.org Contact a health care provider if: You get a cut or sore, especially on your feet. You have signs of infection after a skin injury. You have itchy skin that turns red or develops a rash. You have discolored areas of skin. You have places on your skin that change. They may thicken or appear shiny. This information is not intended to replace advice given to you by your health care provider. Make sure you discuss any questions you have with your health care provider. Document Revised: 05/17/2022 Document Reviewed: 05/17/2022 Elsevier Patient Education  [place].

## 2022-10-02 ENCOUNTER — Telehealth: Payer: Self-pay

## 2022-10-02 NOTE — Telephone Encounter (Signed)
I advised pt that the 2 samples of Ozempic is available for her to pick up.  FYI

## 2022-10-09 ENCOUNTER — Ambulatory Visit (INDEPENDENT_AMBULATORY_CARE_PROVIDER_SITE_OTHER): Payer: Medicare HMO | Admitting: Physician Assistant

## 2022-10-09 ENCOUNTER — Encounter: Payer: Self-pay | Admitting: Physician Assistant

## 2022-10-09 VITALS — BP 163/75 | HR 86 | Temp 97.8°F | Resp 20 | Ht 70.0 in | Wt 213.0 lb

## 2022-10-09 DIAGNOSIS — J329 Chronic sinusitis, unspecified: Secondary | ICD-10-CM | POA: Diagnosis not present

## 2022-10-09 LAB — POC INFLUENZA A&B (BINAX/QUICKVUE)
Influenza A, POC: NEGATIVE
Influenza B, POC: NEGATIVE

## 2022-10-09 MED ORDER — AZITHROMYCIN 250 MG PO TABS: ORAL_TABLET | ORAL | 0 refills | Status: AC

## 2022-10-09 MED ORDER — PREDNISONE 20 MG PO TABS: ORAL_TABLET | ORAL | 0 refills | Status: DC

## 2022-10-09 NOTE — Progress Notes (Signed)
Established patient acute visit   Patient: Samantha [name]   DOB: 1956-06-05   66 y.o. Female  MRN: [id number] Visit Date: 10/09/2022  Chief Complaint  Patient presents with   Sinusitis   Subjective    HPI  Patient presents with c/o bilateral earache, sinus pressure and pain, unilateral headache, runny nose, congestion and chills.  Yesterday started with not feeling well. Feeling worse today. No fever, chest pain, cough or diarrhea. Also beginning to have a sore throat. Has been drinking fluids. Reports has taken Tylenol 1500 mg which did not provide pain relief. Denies sick contact exposures.     Medications: Outpatient Medications Prior to Visit  Medication Sig   aspirin 81 MG tablet Take 81 mg by mouth every morning.    azelastine (ASTELIN) 0.1 % nasal spray Place 1 spray into both nostrils daily as needed for allergies.    budesonide-formoterol (SYMBICORT) 80-4.5 MCG/ACT inhaler Inhale 2 puffs into the lungs 2 (two) times daily.   Calcium Citrate-Vitamin D (CALCIUM + D PO) Take 1 tablet by mouth every morning.    cetirizine (ZYRTEC) 10 MG tablet Take 10 mg by mouth daily.   FLUoxetine (PROZAC) 40 MG capsule Take 1 capsule (40 mg total) by mouth daily.   fluticasone (FLONASE) 50 MCG/ACT nasal spray Place 2 sprays into both nostrils daily.   gabapentin (NEURONTIN) 300 MG capsule TAKE 1 CAPSULE BY MOUTH IN THE MORNING AND 2 IN THE EVENING   hydrochlorothiazide (MICROZIDE) 12.5 MG capsule Take 1 capsule by mouth once daily   Insulin Degludec FlexTouch 100 UNIT/ML SOPN INJECT 12 UNITS SUBCUTANEOUSLY ONCE DAILY   ipratropium-albuterol (DUONEB) 0.5-2.5 (3) MG/3ML SOLN Inhale 3 mLs into the lungs daily as needed (wheezing).    lovastatin (MEVACOR) 20 MG tablet TAKE 1 TABLET BY MOUTH AT BEDTIME   metFORMIN (GLUCOPHAGE) 1000 MG tablet TAKE 1 TABLET BY MOUTH TWICE DAILY WITH A MEAL   methocarbamol (ROBAXIN) 500 MG tablet Take 1 tablet (500 mg total) by mouth every 8 (eight) hours as  needed for muscle spasms.   nitroGLYCERIN (NITROSTAT) 0.4 MG SL tablet Place 1 tablet (0.4 mg total) under the tongue every 5 (five) minutes as needed for chest pain.   Omega-3 Fatty Acids (FISH OIL) 1000 MG CAPS Take 1,000 mg by mouth every morning.    OZEMPIC, 1 MG/DOSE, 4 MG/3ML SOPN INJECT 0.75 MLS (1 MG TOTAL) INTO THE SKIN ONCE A WEEK (USE ONLY ONCE LOWER DOSES ARE FINISHED)   pantoprazole (PROTONIX) 40 MG tablet Take 1 tablet (40 mg total) by mouth daily.   valsartan (DIOVAN) 160 MG tablet Take 1 tablet by mouth once daily   verapamil (CALAN-SR) 240 MG CR tablet TAKE 1 TABLET BY MOUTH ONCE DAILY IN THE MORNING   diclofenac Sodium (VOLTAREN) 1 % GEL Apply 2 g topically 4 (four) times daily. (Patient not taking: Reported on 10/09/2022)   Facility-Administered Medications Prior to Visit  Medication Dose Route Frequency Provider   nitroGLYCERIN (NITROSTAT) SL tablet 0.4 mg  0.4 mg Sublingual Q5 min PRN Nazaret Chea, PA-C    Review of Systems     Objective    BP (!) 163/75 (BP Location: Left Arm, Patient Position: Sitting, Cuff Size: Large)   Pulse 86   Temp 97.8 F (36.6 C) (Oral)   Resp 20   Ht '5\' 10"'$  (1.778 m)   Wt 213 lb (96.6 kg)   SpO2 97%   BMI 30.56 kg/m    Physical Exam  General:  ill-appearing,  non-diaphoretic,  appropriate for stated age.  Neuro:  Alert and oriented,  extra-ocular muscles intact  HEENT:  Normocephalic, atraumatic, PERRL, sinus pressure of frontal and maxillary sinus, slight fluid of left TM without swelling or redness, mild redness of right canal (chronic perforation) without swelling, clear nasal drainage, mild erythema of posterior oropharynx without exudates, neck supple, +anterior cervical adenopathy  Skin:  no gross rash, warm, pink. Cardiac:  RRR, S1 S2 Respiratory: CTA B/L w/o wheezing, rhonchi or crackles. Vascular:  Ext warm, no cyanosis apprec.; cap RF less 2 sec. Psych:  No HI/SI, judgement and insight good, Euthymic mood. Full  Affect.   No results found for any visits on 10/09/22.  Assessment & Plan     Flu collected and negative. Unable to perform COVID PCR, advised patient to perform rapid antigen test at home that she has and notify the office if positive. Patient does have hx of recurrent sinusitis, due to worsening and severity of symptoms will start antibiotic therapy with Zpack and prednisone taper. Discussed to continue fluids, rest and Tylenol 1000 mg every 6-8 hrs as needed for pain relief. Follow-up if symptoms fail to improve or worsen.   Return if symptoms worsen or fail to improve.        Lorrene Reid, PA-C  [facility] at [facility] [phone] (phone) [phone] (fax)  [place]

## 2022-10-16 ENCOUNTER — Other Ambulatory Visit: Payer: Self-pay | Admitting: Physician Assistant

## 2022-10-16 DIAGNOSIS — K219 Gastro-esophageal reflux disease without esophagitis: Secondary | ICD-10-CM

## 2022-11-07 ENCOUNTER — Other Ambulatory Visit: Payer: Self-pay | Admitting: Nurse Practitioner

## 2022-11-07 ENCOUNTER — Telehealth: Payer: Self-pay

## 2022-11-07 DIAGNOSIS — M1990 Unspecified osteoarthritis, unspecified site: Secondary | ICD-10-CM

## 2022-11-07 MED ORDER — DICLOFENAC SODIUM 75 MG PO TBEC: 75.0000 mg | DELAYED_RELEASE_TABLET | Freq: Two times a day (BID) | ORAL | 1 refills | Status: DC | PRN

## 2022-11-07 NOTE — Telephone Encounter (Signed)
Pt is requesting a New Rx for: diclofenac (VOLTAREN) 75 MG EC tablet   Pharmacy: [facility], [place]    Pt states that she advised Baxter Flattery at last visit that she is now taking the TAB instead of the Gel.   LOV 10/09/22 ROV 01/25/23

## 2022-11-07 NOTE — Telephone Encounter (Signed)
I have sent new prescription for this to [facility]

## 2022-12-04 ENCOUNTER — Other Ambulatory Visit: Payer: Self-pay

## 2022-12-04 DIAGNOSIS — E1169 Type 2 diabetes mellitus with other specified complication: Secondary | ICD-10-CM

## 2022-12-04 MED ORDER — VERAPAMIL HCL ER 240 MG PO TBCR: 240.0000 mg | EXTENDED_RELEASE_TABLET | Freq: Every morning | ORAL | 0 refills | Status: DC

## 2022-12-04 MED ORDER — METFORMIN HCL 1000 MG PO TABS: 1000.0000 mg | ORAL_TABLET | Freq: Two times a day (BID) | ORAL | 0 refills | Status: DC

## 2022-12-04 MED ORDER — VALSARTAN 160 MG PO TABS: 160.0000 mg | ORAL_TABLET | Freq: Every day | ORAL | 0 refills | Status: DC

## 2022-12-04 MED ORDER — LOVASTATIN 20 MG PO TABS: 20.0000 mg | ORAL_TABLET | Freq: Every day | ORAL | 0 refills | Status: DC

## 2022-12-04 MED ORDER — GABAPENTIN 300 MG PO CAPS: ORAL_CAPSULE | ORAL | 0 refills | Status: DC

## 2022-12-06 DIAGNOSIS — M170 Bilateral primary osteoarthritis of knee: Secondary | ICD-10-CM | POA: Diagnosis not present

## 2022-12-13 ENCOUNTER — Telehealth: Payer: Self-pay

## 2022-12-13 NOTE — Patient Instructions (Signed)
Visit Information  Thank you for taking time to visit with me today. Please don't hesitate to contact me if I can be of assistance to you.   Following are the goals we discussed today:   Goals Addressed             This Visit's Progress    COMPLETED: Care Coordination Activities-no follow up required       Care Coordination Interventions: Advised patient to Annual Wellness exam. Discussed [facility] services and support. Assessed SDOH. Advised to discuss with primary care physician if services needed in the future.          If you are experiencing a Mental Health or [place] or need someone to talk to, please call the Suicide and Crisis Lifeline: 988   Patient verbalizes understanding of instructions and care plan provided today and agrees to view in [place]. Active MyChart status and patient understanding of how to access instructions and care plan via MyChart confirmed with patient.     No further follow up required: decline  Jone Baseman, RN, MSN [facility] Care Management Coordinator Direct Line [phone]

## 2022-12-13 NOTE — Patient Outreach (Signed)
  Care Coordination   Initial Visit Note   12/13/2022 Name: Samantha [name] MRN: [id number] DOB: 1956/09/08  Samantha [name] is a 67 y.o. year old female who sees Samantha [name], [place] for primary care. I spoke with  Eliberto Ivory by phone today.  What matters to the patients health and wellness today?  none    Goals Addressed             This Visit's Progress    COMPLETED: Care Coordination Activities-no follow up required       Care Coordination Interventions: Advised patient to Annual Wellness exam. Discussed [facility] services and support. Assessed SDOH. Advised to discuss with primary care physician if services needed in the future.        SDOH assessments and interventions completed:  Yes  SDOH Interventions Today    Flowsheet Row Most Recent Value  SDOH Interventions   Food Insecurity Interventions Intervention Not Indicated  Housing Interventions Intervention Not Indicated        Care Coordination Interventions:  Yes, provided   Follow up plan: No further intervention required.   Encounter Outcome:  Pt. Visit Completed   Jone Baseman, RN, MSN [place] [facility] Care Management Coordinator Direct Line [phone]

## 2022-12-17 ENCOUNTER — Emergency Department (HOSPITAL_COMMUNITY)
Admission: EM | Admit: 2022-12-17 | Discharge: 2022-12-17 | Disposition: A | Discharge status: Home / Self Care | Payer: Medicare HMO | Attending: Emergency Medicine | Admitting: Emergency Medicine

## 2022-12-17 ENCOUNTER — Emergency Department (HOSPITAL_COMMUNITY): Payer: Medicare HMO

## 2022-12-17 ENCOUNTER — Encounter (HOSPITAL_COMMUNITY): Payer: Self-pay

## 2022-12-17 DIAGNOSIS — Z79899 Other long term (current) drug therapy: Secondary | ICD-10-CM | POA: Diagnosis not present

## 2022-12-17 DIAGNOSIS — R110 Nausea: Secondary | ICD-10-CM | POA: Insufficient documentation

## 2022-12-17 DIAGNOSIS — I700 Atherosclerosis of aorta: Secondary | ICD-10-CM | POA: Insufficient documentation

## 2022-12-17 DIAGNOSIS — R0789 Other chest pain: Secondary | ICD-10-CM | POA: Diagnosis not present

## 2022-12-17 DIAGNOSIS — Z7982 Long term (current) use of aspirin: Secondary | ICD-10-CM | POA: Insufficient documentation

## 2022-12-17 DIAGNOSIS — K5730 Diverticulosis of large intestine without perforation or abscess without bleeding: Secondary | ICD-10-CM | POA: Diagnosis not present

## 2022-12-17 DIAGNOSIS — R0682 Tachypnea, not elsewhere classified: Secondary | ICD-10-CM | POA: Diagnosis not present

## 2022-12-17 DIAGNOSIS — R079 Chest pain, unspecified: Secondary | ICD-10-CM | POA: Insufficient documentation

## 2022-12-17 DIAGNOSIS — R0602 Shortness of breath: Secondary | ICD-10-CM | POA: Diagnosis not present

## 2022-12-17 DIAGNOSIS — R072 Precordial pain: Secondary | ICD-10-CM | POA: Diagnosis not present

## 2022-12-17 LAB — CBC
HCT: 42.4 % (ref 36.0–46.0)
Hemoglobin: 13.6 g/dL (ref 12.0–15.0)
MCH: 28 pg (ref 26.0–34.0)
MCHC: 32.1 g/dL (ref 30.0–36.0)
MCV: 87.4 fL (ref 80.0–100.0)
Platelets: 403 10*3/uL — ABNORMAL HIGH (ref 150–400)
RBC: 4.85 MIL/uL (ref 3.87–5.11)
RDW: 13.8 % (ref 11.5–15.5)
WBC: 12.4 10*3/uL — ABNORMAL HIGH (ref 4.0–10.5)
nRBC: 0 % (ref 0.0–0.2)

## 2022-12-17 LAB — HEPATIC FUNCTION PANEL
ALT: 18 U/L (ref 0–44)
AST: 20 U/L (ref 15–41)
Albumin: 4 g/dL (ref 3.5–5.0)
Alkaline Phosphatase: 64 U/L (ref 38–126)
Bilirubin, Direct: 0.1 mg/dL (ref 0.0–0.2)
Indirect Bilirubin: 0.3 mg/dL (ref 0.3–0.9)
Total Bilirubin: 0.4 mg/dL (ref 0.3–1.2)
Total Protein: 8.2 g/dL — ABNORMAL HIGH (ref 6.5–8.1)

## 2022-12-17 LAB — I-STAT CHEM 8, ED
BUN: 22 mg/dL (ref 8–23)
Calcium, Ion: 1.13 mmol/L — ABNORMAL LOW (ref 1.15–1.40)
Chloride: 101 mmol/L (ref 98–111)
Creatinine, Ser: 1.1 mg/dL — ABNORMAL HIGH (ref 0.44–1.00)
Glucose, Bld: 142 mg/dL — ABNORMAL HIGH (ref 70–99)
HCT: 39 % (ref 36.0–46.0)
Hemoglobin: 13.3 g/dL (ref 12.0–15.0)
Potassium: 3.8 mmol/L (ref 3.5–5.1)
Sodium: 137 mmol/L (ref 135–145)
TCO2: 24 mmol/L (ref 22–32)

## 2022-12-17 LAB — BASIC METABOLIC PANEL
Anion gap: 9 (ref 5–15)
BUN: 25 mg/dL — ABNORMAL HIGH (ref 8–23)
CO2: 25 mmol/L (ref 22–32)
Calcium: 9.3 mg/dL (ref 8.9–10.3)
Chloride: 99 mmol/L (ref 98–111)
Creatinine, Ser: 1.13 mg/dL — ABNORMAL HIGH (ref 0.44–1.00)
GFR, Estimated: 53 mL/min — ABNORMAL LOW (ref >60)
Glucose, Bld: 146 mg/dL — ABNORMAL HIGH (ref 70–99)
Potassium: 3.8 mmol/L (ref 3.5–5.1)
Sodium: 133 mmol/L — ABNORMAL LOW (ref 135–145)

## 2022-12-17 LAB — TYPE AND SCREEN
ABO/RH(D): A POS
Antibody Screen: NEGATIVE

## 2022-12-17 LAB — D-DIMER, QUANTITATIVE: D-Dimer, Quant: <0.27 ug/mL-FEU (ref 0.00–0.50)

## 2022-12-17 LAB — TROPONIN I (HIGH SENSITIVITY)
Troponin I (High Sensitivity): 7 ng/L (ref <18)
Troponin I (High Sensitivity): 7 ng/L (ref <18)

## 2022-12-17 LAB — CBG MONITORING, ED: Glucose-Capillary: 125 mg/dL — ABNORMAL HIGH (ref 70–99)

## 2022-12-17 LAB — LIPASE, BLOOD: Lipase: 38 U/L (ref 11–51)

## 2022-12-17 LAB — LACTIC ACID, PLASMA: Lactic Acid, Venous: 1.5 mmol/L (ref 0.5–1.9)

## 2022-12-17 MED ORDER — FENTANYL CITRATE PF 50 MCG/ML IJ SOSY
50.0000 ug | PREFILLED_SYRINGE | Freq: Once | INTRAMUSCULAR | Status: AC
  Administered 2022-12-17: 50 ug via INTRAVENOUS
  Filled 2022-12-17: qty 1

## 2022-12-17 MED ORDER — ONDANSETRON HCL 4 MG/2ML IJ SOLN
4.0000 mg | Freq: Once | INTRAMUSCULAR | Status: AC
  Administered 2022-12-17: 4 mg via INTRAVENOUS
  Filled 2022-12-17: qty 2

## 2022-12-17 NOTE — ED Triage Notes (Signed)
Pt c/o two days of midsternal CP radiating to her back. Pt tachypneic in triage, c/o SOB, nausea.

## 2022-12-17 NOTE — ED Provider Notes (Signed)
[facility] Provider Note   CSN: [id number] Arrival date & time: 12/17/22  1032     History  Chief Complaint  Patient presents with   Chest Pain    BRIASIA FLINDERS is a 67 y.o. female.  67 year old female presents with 2 days of midsternal chest pain radiating to her back.  Patient states that initially symptoms wax and wane although not any persistent.  They have been associated with shortness of breath as well is nausea.  Denies any syncope or near syncope.  No fever cough or congestion.  No prior history of same.  No treatment use prior to arrival.  Denies any numbness or tingling to her arms.       Home Medications Prior to Admission medications   Medication Sig Start Date End Date Taking? Authorizing Provider  aspirin 81 MG tablet Take 81 mg by mouth every morning.     [provider]  azelastine (ASTELIN) 0.1 % nasal spray Place 1 spray into both nostrils daily as needed for allergies.     [provider]  budesonide-formoterol (SYMBICORT) 80-4.5 MCG/ACT inhaler Inhale 2 puffs into the lungs 2 (two) times daily. 10/29/18   Collene Gobble, MD  Calcium Citrate-Vitamin D (CALCIUM + D PO) Take 1 tablet by mouth every morning.     [provider]  cetirizine (ZYRTEC) 10 MG tablet Take 10 mg by mouth daily.    [provider]  diclofenac (VOLTAREN) 75 MG EC tablet Take 1 tablet (75 mg total) by mouth 2 (two) times daily as needed. 11/07/22   Ronnell Freshwater, NP  diclofenac Sodium (VOLTAREN) 1 % GEL Apply 2 g topically 4 (four) times daily. Patient not taking: Reported on 10/09/2022 07/13/21   Prosperi, Christian H, PA-C  FLUoxetine (PROZAC) 40 MG capsule Take 1 capsule (40 mg total) by mouth daily. 05/25/22   Abonza, Herb Grays, PA-C  fluticasone (FLONASE) 50 MCG/ACT nasal spray Place 2 sprays into both nostrils daily. 11/12/20   [name], PA-C  gabapentin (NEURONTIN) 300 MG capsule TAKE 1  CAPSULE BY MOUTH IN THE MORNING AND 2 IN THE EVENING 12/04/22   Ronnell Freshwater, NP  hydrochlorothiazide (MICROZIDE) 12.5 MG capsule Take 1 capsule by mouth once daily 10/16/22   Ronnell Freshwater, NP  Insulin Degludec FlexTouch 100 UNIT/ML SOPN INJECT 12 UNITS SUBCUTANEOUSLY ONCE DAILY 06/08/22   Abonza, Maritza, PA-C  ipratropium-albuterol (DUONEB) 0.5-2.5 (3) MG/3ML SOLN Inhale 3 mLs into the lungs daily as needed (wheezing).  06/25/17   [provider]  lovastatin (MEVACOR) 20 MG tablet Take 1 tablet (20 mg total) by mouth at bedtime. 12/04/22   Ronnell Freshwater, NP  metFORMIN (GLUCOPHAGE) 1000 MG tablet Take 1 tablet (1,000 mg total) by mouth 2 (two) times daily with a meal. 12/04/22   Boscia, Greer Ee, NP  methocarbamol (ROBAXIN) 500 MG tablet Take 1 tablet (500 mg total) by mouth every 8 (eight) hours as needed for muscle spasms. 03/14/22   Lorrene Reid, PA-C  nitroGLYCERIN (NITROSTAT) 0.4 MG SL tablet Place 1 tablet (0.4 mg total) under the tongue every 5 (five) minutes as needed for chest pain. 06/29/21   Nahser, Wonda Cheng, MD  Omega-3 Fatty Acids (FISH OIL) 1000 MG CAPS Take 1,000 mg by mouth every morning.     [provider]  OZEMPIC, 1 MG/DOSE, 4 MG/3ML SOPN INJECT 0.75 MLS (1 MG TOTAL) INTO THE SKIN ONCE A WEEK (USE ONLY ONCE LOWER DOSES  ARE FINISHED) 04/21/21   Lorrene Reid, PA-C  pantoprazole (PROTONIX) 40 MG tablet Take 1 tablet by mouth once daily 10/16/22   Ronnell Freshwater, NP  predniSONE (DELTASONE) 20 MG tablet Take 2 tablets by mouth x 2 days, 1 tablets x 2 days, 0.5 tablet x 2 days 10/09/22   Lorrene Reid, PA-C  valsartan (DIOVAN) 160 MG tablet Take 1 tablet (160 mg total) by mouth daily. 12/04/22   Ronnell Freshwater, NP  verapamil (CALAN-SR) 240 MG CR tablet Take 1 tablet (240 mg total) by mouth every morning. 12/04/22   Ronnell Freshwater, NP      Allergies    Iohexol, Iodinated contrast media, and Jardiance [empagliflozin]    Review of Systems   Review of  Systems  All other systems reviewed and are negative.   Physical Exam Updated Vital Signs BP (!) 140/103 (BP Location: Left Arm)   Pulse 72   Temp 98.5 F (36.9 C) (Oral)   Resp 16   SpO2 100%  Physical Exam Vitals and nursing note reviewed.  Constitutional:      General: She is not in acute distress.    Appearance: Normal appearance. She is well-developed. She is not toxic-appearing.  HENT:     Head: Normocephalic and atraumatic.  Eyes:     General: Lids are normal.     Conjunctiva/sclera: Conjunctivae normal.     Pupils: Pupils are equal, round, and reactive to light.  Neck:     Thyroid: No thyroid mass.     Trachea: No tracheal deviation.  Cardiovascular:     Rate and Rhythm: Normal rate and regular rhythm.     Heart sounds: Normal heart sounds. No murmur heard.    No gallop.  Pulmonary:     Effort: Pulmonary effort is normal. No respiratory distress.     Breath sounds: Normal breath sounds. No stridor. No decreased breath sounds, wheezing, rhonchi or rales.  Abdominal:     General: There is no distension.     Palpations: Abdomen is soft.     Tenderness: There is no abdominal tenderness. There is no rebound.  Musculoskeletal:        General: No tenderness. Normal range of motion.     Cervical back: Normal range of motion and neck supple.  Skin:    General: Skin is warm and dry.     Findings: No abrasion or rash.  Neurological:     Mental Status: She is alert and oriented to person, place, and time. Mental status is at baseline.     GCS: GCS eye subscore is 4. GCS verbal subscore is 5. GCS motor subscore is 6.     Cranial Nerves: No cranial nerve deficit.     Sensory: No sensory deficit.     Motor: Motor function is intact.  Psychiatric:        Attention and Perception: Attention normal.        Speech: Speech normal.        Behavior: Behavior normal.     ED Results / Procedures / Treatments   Labs (all labs ordered are listed, but only abnormal results are  displayed) Labs Reviewed  BASIC METABOLIC PANEL  CBC  LACTIC ACID, PLASMA  LACTIC ACID, PLASMA  LIPASE, BLOOD  HEPATIC FUNCTION PANEL  I-STAT CHEM 8, ED  TYPE AND SCREEN  TROPONIN I (HIGH SENSITIVITY)    EKG EKG Interpretation  Date/Time:  Sunday December 17 2022 10:45:41 EST Ventricular Rate:  78 PR Interval:  136 QRS Duration: 127 QT Interval:  405 QTC Calculation: 462 R Axis:   213 Text Interpretation: Right and left arm electrode reversal, interpretation assumes no reversal Sinus rhythm Left atrial enlargement Consider left ventricular hypertrophy Probable lateral infarct, age indeterminate Baseline wander in lead(s) V4 V5 No significant change since last tracing Confirmed by Lacretia Leigh ([id number]) on 12/17/2022 10:48:30 AM  Radiology No results found.  Procedures Procedures    Medications Ordered in ED Medications  ondansetron (ZOFRAN) injection 4 mg (4 mg Intravenous Given 12/17/22 1058)    ED Course/ Medical Decision Making/ A&P                             Medical Decision Making Amount and/or Complexity of Data Reviewed Labs: ordered. Radiology: ordered.  Risk Prescription drug management.   Patient is EKG per interpretation shows normal sinus rhythm.  Unchanged from prior to prior studies.  Patient complaining of having chest pain going to her back.  Concern for possible aortic dissection.  Patient has contrast dye allergy and could not have IV contrast.  Discussed with radiologist on-call and recommended noncontrast CT of chest and abdomen.  That was performed and per his review and my review interpretation did not show any signs of acute dissection.  Concern for possible ACS and EKG without signs of ischemia.  Troponins x 2 were negative.  Possibility of DVT and D-dimer also negative.  Patient medicated with fentanyl and feels much better.  Sent in for possible abdominal etiology and no evidence of pancreatitis.  LFTs are normal.  Repeat abdominal exam is  benign.  Unclear etiology for symptoms but vital signs are stable.  At this plan patient will be discharged and return precautions given.  CRITICAL CARE Performed by: Leota Jacobsen Total critical care time: 45 minutes Critical care time was exclusive of separately billable procedures and treating other patients. Critical care was necessary to treat or prevent imminent or life-threatening deterioration. Critical care was time spent personally by me on the following activities: development of treatment plan with patient and/or surrogate as well as nursing, discussions with consultants, evaluation of patient's response to treatment, examination of patient, obtaining history from patient or surrogate, ordering and performing treatments and interventions, ordering and review of laboratory studies, ordering and review of radiographic studies, pulse oximetry and re-evaluation of patient's condition.         Final Clinical Impression(s) / ED Diagnoses Final diagnoses:  None    Rx / DC Orders ED Discharge Orders     None         Lacretia Leigh, MD 12/17/22 1435

## 2022-12-21 ENCOUNTER — Telehealth: Payer: Self-pay

## 2022-12-21 NOTE — Telephone Encounter (Signed)
        Patient  visited [facility] on 1/21     Telephone encounter attempt :  1st  A HIPAA compliant voice message was left requesting a return call.  Instructed patient to call back     [facility] [employer]  [phone] [address], [place], [place] [address] Phone: [phone] Email: [email or web address]

## 2022-12-22 ENCOUNTER — Encounter (HOSPITAL_COMMUNITY): Payer: Self-pay

## 2022-12-22 ENCOUNTER — Emergency Department (HOSPITAL_COMMUNITY)
Admission: EM | Admit: 2022-12-22 | Discharge: 2022-12-22 | Disposition: A | Discharge status: Home / Self Care | Payer: Medicare HMO | Attending: Emergency Medicine | Admitting: Emergency Medicine

## 2022-12-22 ENCOUNTER — Emergency Department (HOSPITAL_COMMUNITY): Payer: Medicare HMO

## 2022-12-22 ENCOUNTER — Ambulatory Visit
Admission: EM | Admit: 2022-12-22 | Discharge: 2022-12-22 | Disposition: A | Discharge status: Home / Self Care | Payer: Medicare HMO | Attending: Physician Assistant | Admitting: Physician Assistant

## 2022-12-22 DIAGNOSIS — R1032 Left lower quadrant pain: Secondary | ICD-10-CM

## 2022-12-22 DIAGNOSIS — Z794 Long term (current) use of insulin: Secondary | ICD-10-CM | POA: Diagnosis not present

## 2022-12-22 DIAGNOSIS — E860 Dehydration: Secondary | ICD-10-CM

## 2022-12-22 DIAGNOSIS — R9431 Abnormal electrocardiogram [ECG] [EKG]: Secondary | ICD-10-CM | POA: Diagnosis not present

## 2022-12-22 DIAGNOSIS — R1084 Generalized abdominal pain: Secondary | ICD-10-CM | POA: Diagnosis not present

## 2022-12-22 DIAGNOSIS — Z7984 Long term (current) use of oral hypoglycemic drugs: Secondary | ICD-10-CM | POA: Diagnosis not present

## 2022-12-22 DIAGNOSIS — Z7982 Long term (current) use of aspirin: Secondary | ICD-10-CM | POA: Diagnosis not present

## 2022-12-22 DIAGNOSIS — I9589 Other hypotension: Secondary | ICD-10-CM | POA: Diagnosis not present

## 2022-12-22 DIAGNOSIS — R112 Nausea with vomiting, unspecified: Secondary | ICD-10-CM

## 2022-12-22 DIAGNOSIS — E119 Type 2 diabetes mellitus without complications: Secondary | ICD-10-CM | POA: Diagnosis not present

## 2022-12-22 DIAGNOSIS — I700 Atherosclerosis of aorta: Secondary | ICD-10-CM | POA: Diagnosis not present

## 2022-12-22 DIAGNOSIS — R1030 Lower abdominal pain, unspecified: Secondary | ICD-10-CM | POA: Diagnosis not present

## 2022-12-22 DIAGNOSIS — I10 Essential (primary) hypertension: Secondary | ICD-10-CM | POA: Diagnosis not present

## 2022-12-22 DIAGNOSIS — Z79899 Other long term (current) drug therapy: Secondary | ICD-10-CM | POA: Insufficient documentation

## 2022-12-22 DIAGNOSIS — R42 Dizziness and giddiness: Secondary | ICD-10-CM | POA: Diagnosis not present

## 2022-12-22 DIAGNOSIS — R1012 Left upper quadrant pain: Secondary | ICD-10-CM

## 2022-12-22 DIAGNOSIS — E861 Hypovolemia: Secondary | ICD-10-CM

## 2022-12-22 DIAGNOSIS — R109 Unspecified abdominal pain: Secondary | ICD-10-CM | POA: Diagnosis not present

## 2022-12-22 DIAGNOSIS — K529 Noninfective gastroenteritis and colitis, unspecified: Secondary | ICD-10-CM | POA: Insufficient documentation

## 2022-12-22 DIAGNOSIS — R197 Diarrhea, unspecified: Secondary | ICD-10-CM | POA: Diagnosis not present

## 2022-12-22 DIAGNOSIS — R110 Nausea: Secondary | ICD-10-CM | POA: Diagnosis not present

## 2022-12-22 LAB — CBC WITH DIFFERENTIAL/PLATELET
Abs Immature Granulocytes: 0.07 10*3/uL (ref 0.00–0.07)
Basophils Absolute: 0.1 10*3/uL (ref 0.0–0.1)
Basophils Relative: 0 %
Eosinophils Absolute: 0.1 10*3/uL (ref 0.0–0.5)
Eosinophils Relative: 0 %
HCT: 38.5 % (ref 36.0–46.0)
Hemoglobin: 12.4 g/dL (ref 12.0–15.0)
Immature Granulocytes: 0 %
Lymphocytes Relative: 9 %
Lymphs Abs: 1.9 10*3/uL (ref 0.7–4.0)
MCH: 28.4 pg (ref 26.0–34.0)
MCHC: 32.2 g/dL (ref 30.0–36.0)
MCV: 88.1 fL (ref 80.0–100.0)
Monocytes Absolute: 1.3 10*3/uL — ABNORMAL HIGH (ref 0.1–1.0)
Monocytes Relative: 6 %
Neutro Abs: 17.1 10*3/uL — ABNORMAL HIGH (ref 1.7–7.7)
Neutrophils Relative %: 85 %
Platelets: 338 10*3/uL (ref 150–400)
RBC: 4.37 MIL/uL (ref 3.87–5.11)
RDW: 13.9 % (ref 11.5–15.5)
WBC: 20.5 10*3/uL — ABNORMAL HIGH (ref 4.0–10.5)
nRBC: 0 % (ref 0.0–0.2)

## 2022-12-22 LAB — COMPREHENSIVE METABOLIC PANEL
ALT: 23 U/L (ref 0–44)
AST: 16 U/L (ref 15–41)
Albumin: 3.6 g/dL (ref 3.5–5.0)
Alkaline Phosphatase: 63 U/L (ref 38–126)
Anion gap: 12 (ref 5–15)
BUN: 28 mg/dL — ABNORMAL HIGH (ref 8–23)
CO2: 24 mmol/L (ref 22–32)
Calcium: 9.2 mg/dL (ref 8.9–10.3)
Chloride: 101 mmol/L (ref 98–111)
Creatinine, Ser: 1.03 mg/dL — ABNORMAL HIGH (ref 0.44–1.00)
GFR, Estimated: 59 mL/min — ABNORMAL LOW (ref >60)
Glucose, Bld: 155 mg/dL — ABNORMAL HIGH (ref 70–99)
Potassium: 3.7 mmol/L (ref 3.5–5.1)
Sodium: 137 mmol/L (ref 135–145)
Total Bilirubin: 0.7 mg/dL (ref 0.3–1.2)
Total Protein: 7 g/dL (ref 6.5–8.1)

## 2022-12-22 LAB — LIPASE, BLOOD: Lipase: 33 U/L (ref 11–51)

## 2022-12-22 MED ORDER — ONDANSETRON 4 MG PO TBDP
4.0000 mg | ORAL_TABLET | Freq: Once | ORAL | Status: AC
  Administered 2022-12-22: 4 mg via ORAL
  Filled 2022-12-22: qty 1

## 2022-12-22 MED ORDER — SODIUM CHLORIDE 0.9 % IV BOLUS
1000.0000 mL | Freq: Once | INTRAVENOUS | Status: AC
  Administered 2022-12-22: 1000 mL via INTRAVENOUS

## 2022-12-22 MED ORDER — ONDANSETRON HCL 4 MG/2ML IJ SOLN
4.0000 mg | Freq: Once | INTRAMUSCULAR | Status: AC
  Administered 2022-12-22: 4 mg via INTRAVENOUS
  Filled 2022-12-22: qty 2

## 2022-12-22 MED ORDER — ONDANSETRON 4 MG PO TBDP
4.0000 mg | ORAL_TABLET | Freq: Once | ORAL | Status: AC
  Administered 2022-12-22: 4 mg via ORAL

## 2022-12-22 MED ORDER — ONDANSETRON 4 MG PO TBDP: 4.0000 mg | ORAL_TABLET | Freq: Three times a day (TID) | ORAL | 0 refills | Status: DC | PRN

## 2022-12-22 MED ORDER — SODIUM CHLORIDE 0.9 % IV SOLN: INTRAVENOUS | Status: DC

## 2022-12-22 MED ORDER — SODIUM CHLORIDE 0.9 % IV BOLUS: 1000.0000 mL | Freq: Once | INTRAVENOUS | Status: DC

## 2022-12-22 MED ORDER — CIPROFLOXACIN HCL 500 MG PO TABS
500.0000 mg | ORAL_TABLET | Freq: Once | ORAL | Status: AC
  Administered 2022-12-22: 500 mg via ORAL
  Filled 2022-12-22: qty 1

## 2022-12-22 MED ORDER — LOPERAMIDE HCL 2 MG PO CAPS: 2.0000 mg | ORAL_CAPSULE | Freq: Four times a day (QID) | ORAL | 0 refills | Status: DC | PRN

## 2022-12-22 MED ORDER — CIPROFLOXACIN HCL 500 MG PO TABS: 500.0000 mg | ORAL_TABLET | Freq: Two times a day (BID) | ORAL | 0 refills | Status: DC

## 2022-12-22 NOTE — ED Provider Notes (Signed)
[name]    CSN: [id number] Arrival date & time: 12/22/22  0920      History   Chief Complaint Chief Complaint  Patient presents with   Abdominal Pain   Flank Pain   Shortness of Breath    HPI Samantha [name] is a 67 y.o. female.   67 year old female presents with vomiting, abdominal pain, fatigue.  Patient indicates for the past week she has been having persistent, progressive, and constant vomiting.  She indicates that when she does have episodes of emesis that she is only bringing up clear fluids.  Patient indicates for the past week she has been having abdominal pain that has been left upper quadrant, constant, and left lower quadrant, that has been constant, described as severe, on a scale of 1-10 and she indicates the pain is a 10.  Patient indicates that she is not able to tolerate fluids due to vomiting, she has not been able to eat any foods.  She indicates she has also had some back pain associated in the flank area, shortness of breath without wheezing, and also most recently some upper respiratory congestion.  Patient indicates that she has fatigue, lethargy, and weakness due to having the constant vomiting.  She also indicates that she has been having dizziness, lightheaded when she stands up, and headaches on a regular basis for the past couple days.  She indicates she has not had fever or chills, she does indicates she was seen at [facility] on 12/17/2022 and had chest, abdominal, and pelvic CT scan.  Patient indicates that she was advised that studies were normal and released back home.  She indicates that her symptoms have continued and worsened since then.  He indicates that her last bowel movement was 3 to 4 days ago and was formed.  She denies having diarrhea.   Abdominal Pain Associated symptoms: shortness of breath   Flank Pain Associated symptoms include abdominal pain and shortness of breath.  Shortness of Breath Associated symptoms: abdominal  pain     Past Medical History:  Diagnosis Date   Arthritis    Dyslipidemia    Heart disease    Heart murmur 2011   Left bundle branch block 2011   Type II or unspecified type diabetes mellitus without mention of complication, not stated as uncontrolled    Unspecified essential hypertension     Patient Active Problem List   Diagnosis Date Noted   Left foot pain 02/20/2022   Neuroma of foot 02/20/2022   Unstable angina ([place])    Arthritis 01/14/2021   Bowel incontinence 01/14/2021   ETD (eustachian tube dysfunction) 01/14/2021   Left lower quadrant pain 01/14/2021   Personal history of colonic polyps 01/14/2021   Screening for malignant neoplasm of colon 01/14/2021   Hyperlipidemia associated with type 2 diabetes mellitus ([place]) 02/23/2020   Microalbuminuria due to type 2 diabetes mellitus ([place]) 02/23/2020   Type 2 diabetes mellitus with morbid obesity ([place]) 02/23/2020   B12 deficiency 02/23/2020   Vitamin D deficiency 02/23/2020   Family history of heart disease 02/23/2020   Family history of diabetes mellitus (DM) 02/23/2020   Family history of colon cancer-grandmother 02/23/2020   Family history of mixed hyperlipidemia 02/23/2020   Family history of hypertension 02/23/2020   Mood disorder ([place]) 02/23/2020   Degenerative disc disease, lumbar 07/19/2018   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   Spondylosis of lumbar spine 07/19/2018   Lumbar radiculopathy 07/05/2018   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 02/21/2018   Eustachian tube dysfunction, bilateral 02/21/2018   Recurrent sinusitis 02/21/2018   Tympanic membrane retraction, left 02/21/2018   GERD (gastroesophageal reflux disease) 10/24/2017   Cough 08/14/2017   Dyspnea 08/14/2017   Pulmonary nodule, right 08/14/2017   Back pain 07/12/2017   Obesity (BMI 30-39.9) 06/11/2017   LBBB (left bundle branch block) 08/10/2016   Leukocytosis 08/10/2016   Hypertension  associated with diabetes ([place]) 08/10/2016   Pain in the chest 08/09/2016   Diabetes ([place]) 11/20/2015    Past Surgical History:  Procedure Laterality Date   CATARACT EXTRACTION W/PHACO Left 07/11/2022   Procedure: CATARACT EXTRACTION PHACO AND INTRAOCULAR LENS PLACEMENT ([place]) LEFT  DIABETIC;  Surgeon: Birder Robson, MD;  Location: [facility];  Service: Ophthalmology;  Laterality: Left;  3.60 0:22.7   CATARACT EXTRACTION W/PHACO Right 07/25/2022   Procedure: CATARACT EXTRACTION PHACO AND INTRAOCULAR LENS PLACEMENT ([facility]) RIGHT DIABETIC 5.12 00:33.0;  Surgeon: Birder Robson, MD;  Location: [facility];  Service: Ophthalmology;  Laterality: Right;   KNEE ARTHROSCOPY Right 2017   LEFT HEART CATH AND CORONARY ANGIOGRAPHY N/A 06/30/2021   Procedure: LEFT HEART CATH AND CORONARY ANGIOGRAPHY;  Surgeon: Troy Sine, MD;  Location: [facility];  Service: Cardiovascular;  Laterality: N/A;   TONSILLECTOMY  1962    OB History   No obstetric history on file.      Home Medications    Prior to Admission medications   Medication Sig Start Date End Date Taking? Authorizing Provider  aspirin 81 MG tablet Take 81 mg by mouth every morning.     [provider]  azelastine (ASTELIN) 0.1 % nasal spray Place 1 spray into both nostrils daily as needed for allergies.     [provider]  budesonide-formoterol (SYMBICORT) 80-4.5 MCG/ACT inhaler Inhale 2 puffs into the lungs 2 (two) times daily. 10/29/18   Collene Gobble, MD  Calcium Citrate-Vitamin D (CALCIUM + D PO) Take 1 tablet by mouth every morning.     [provider]  cetirizine (ZYRTEC) 10 MG tablet Take 10 mg by mouth daily.    [provider]  diclofenac (VOLTAREN) 75 MG EC tablet Take 1 tablet (75 mg total) by mouth 2 (two) times daily as needed. 11/07/22   Ronnell Freshwater, NP  diclofenac Sodium (VOLTAREN) 1 % GEL Apply 2 g topically 4 (four) times daily. Patient not taking:  Reported on 10/09/2022 07/13/21   Prosperi, Christian H, PA-C  FLUoxetine (PROZAC) 40 MG capsule Take 1 capsule (40 mg total) by mouth daily. 05/25/22   Abonza, Herb Grays, PA-C  fluticasone (FLONASE) 50 MCG/ACT nasal spray Place 2 sprays into both nostrils daily. 11/12/20   [name], PA-C  gabapentin (NEURONTIN) 300 MG capsule TAKE 1 CAPSULE BY MOUTH IN THE MORNING AND 2 IN THE EVENING 12/04/22   Ronnell Freshwater, NP  hydrochlorothiazide (MICROZIDE) 12.5 MG capsule Take 1 capsule by mouth once daily 10/16/22   Ronnell Freshwater, NP  Insulin Degludec FlexTouch 100 UNIT/ML SOPN INJECT 12 UNITS SUBCUTANEOUSLY ONCE DAILY 06/08/22   Abonza, Maritza, PA-C  ipratropium-albuterol (DUONEB) 0.5-2.5 (3) MG/3ML SOLN Inhale 3 mLs into the lungs daily as needed (wheezing).  06/25/17   [provider]  lovastatin (MEVACOR) 20 MG tablet Take 1 tablet (20 mg total) by mouth at bedtime. 12/04/22   Ronnell Freshwater, NP  metFORMIN (GLUCOPHAGE) 1000 MG tablet Take 1 tablet (1,000 mg total) by mouth 2 (two) times daily with  a meal. 12/04/22   Boscia, Greer Ee, NP  methocarbamol (ROBAXIN) 500 MG tablet Take 1 tablet (500 mg total) by mouth every 8 (eight) hours as needed for muscle spasms. 03/14/22   Lorrene Reid, PA-C  nitroGLYCERIN (NITROSTAT) 0.4 MG SL tablet Place 1 tablet (0.4 mg total) under the tongue every 5 (five) minutes as needed for chest pain. 06/29/21   Nahser, Wonda Cheng, MD  Omega-3 Fatty Acids (FISH OIL) 1000 MG CAPS Take 1,000 mg by mouth every morning.     [provider]  OZEMPIC, 1 MG/DOSE, 4 MG/3ML SOPN INJECT 0.75 MLS (1 MG TOTAL) INTO THE SKIN ONCE A WEEK (USE ONLY ONCE LOWER DOSES ARE FINISHED) 04/21/21   Lorrene Reid, PA-C  pantoprazole (PROTONIX) 40 MG tablet Take 1 tablet by mouth once daily 10/16/22   Ronnell Freshwater, NP  predniSONE (DELTASONE) 20 MG tablet Take 2 tablets by mouth x 2 days, 1 tablets x 2 days, 0.5 tablet x 2 days 10/09/22   Lorrene Reid, PA-C  valsartan (DIOVAN)  160 MG tablet Take 1 tablet (160 mg total) by mouth daily. 12/04/22   Ronnell Freshwater, NP  verapamil (CALAN-SR) 240 MG CR tablet Take 1 tablet (240 mg total) by mouth every morning. 12/04/22   Ronnell Freshwater, NP    Family History Family History  Problem Relation Age of Onset   Diabetes Mother    Hypertension Mother    CAD Mother    Hypertension Father    Diabetes Father    CAD Father    Diabetes Brother    Hyperlipidemia Brother    Hypertension Brother    CAD Brother    Diabetes Sister    Hypertension Sister     Social History Social History   Tobacco Use   Smoking status: Never   Smokeless tobacco: Never  Vaping Use   Vaping Use: Never used  Substance Use Topics   Alcohol use: No   Drug use: No     Allergies   Iohexol, Iodinated contrast media, and Jardiance [empagliflozin]   Review of Systems Review of Systems  Respiratory:  Positive for shortness of breath.   Gastrointestinal:  Positive for abdominal pain.  Genitourinary:  Positive for flank pain.     Physical Exam Triage Vital Signs ED Triage Vitals  Enc Vitals Group     BP --      Pulse Rate 12/22/22 1042 (!) 102     Resp 12/22/22 1042 20     Temp 12/22/22 1042 98.5 F (36.9 C)     Temp Source 12/22/22 1042 Oral     SpO2 12/22/22 1042 95 %     Weight --      Height --      Head Circumference --      Peak Flow --      Pain Score 12/22/22 1044 10     Pain Loc --      Pain Edu? --      Excl. in [place]? --    No data found.  Updated Vital Signs Pulse (S) (!) 102   Temp 98.5 F (36.9 C) (Oral)   Resp 20   SpO2 95%   Visual Acuity Right Eye Distance:   Left Eye Distance:   Bilateral Distance:    Right Eye Near:   Left Eye Near:    Bilateral Near:     Physical Exam Constitutional:      Appearance: She is ill-appearing (pale, tachypnea, vomiting in exam  room).  HENT:     Right Ear: Tympanic membrane and ear canal normal.     Left Ear: Tympanic membrane and ear canal normal.      Mouth/Throat:     Mouth: Mucous membranes are pale and dry.     Pharynx: Oropharynx is clear.  Cardiovascular:     Rate and Rhythm: Normal rate and regular rhythm.     Heart sounds: Normal heart sounds.  Pulmonary:     Effort: Pulmonary effort is normal.     Breath sounds: Normal breath sounds and air entry. No wheezing, rhonchi or rales.  Abdominal:     General: Abdomen is flat. Bowel sounds are increased.     Palpations: Abdomen is soft.     Tenderness: There is abdominal tenderness in the left upper quadrant and left lower quadrant. There is guarding.    Neurological:     Mental Status: She is alert.      UC Treatments / Results  Labs (all labs ordered are listed, but only abnormal results are displayed) Labs Reviewed - No data to display  EKG   Radiology No results found.  Procedures Procedures (including critical care time)  Medications Ordered in UC Medications  sodium chloride 0.9 % bolus 1,000 mL (has no administration in time range)  ondansetron (ZOFRAN-ODT) disintegrating tablet 4 mg (4 mg Oral Given 12/22/22 1114)    Initial Impression / Assessment and Plan / UC Course  I have reviewed the triage vital signs and the nursing notes.  Pertinent labs & imaging results that were available during my care of the patient were reviewed by me and considered in my medical decision making (see chart for details).    Plan: The diagnosis we treated with the following: 1.  Nausea and vomiting: A.  Zofran 4 mg given in the office today. 2.  Dehydration: A.  IV fluids-normal saline-unable to start due to dehydration. 3.  Left upper quadrant abdominal pain: A.  Zofran 4 mg given in the office today to help decrease vomiting and abdominal discomfort. 4.  Hypotension: A.  IV fluids-normal saline-unable to start due to dehydration. 5.  Patient transported to ER by EMS for higher level care. Final Clinical Impressions(s) / UC Diagnoses   Final diagnoses:  Left upper  quadrant abdominal pain  Nausea and vomiting, unspecified vomiting type  Dehydration  Hypotension due to hypovolemia     Discharge Instructions      Patient is transferred to the emergency room via EMS for higher level of care.     ED Prescriptions   None    PDMP not reviewed this encounter.   Nyoka Lint, PA-C 12/22/22 1123

## 2022-12-22 NOTE — ED Provider Notes (Addendum)
[place] [facility]MENT AT [facility] Provider Note   CSN: [id number] Arrival date & time: 12/22/22  1200     History  Chief Complaint  Patient presents with   Abdominal Pain    Samantha [name] is a 67 y.o. female.  Patient sent in from urgent care.  Patient states she has had 1 week history of left-sided abdominal pain associated with vomiting dry heaves occasional diarrhea but no diarrhea recently.  Says that she has not been able to really take anything significantly orally.  And feels dehydrated.  They noted urgent care that she had some tenderness on her abdomen sent in for concerns for diverticulitis because patient known to have diverticulosis.  Patient was seen in the emergency department on January 21 but it was really just for chest pain at that time.  Although according to patient she did have the belly pain and at that time as well.  Patient's past medical history type 2 diabetes hyperlipidemia hypertension left bundle branch block.  Left heart catheterization in 2022.  Patient is never used tobacco products.       Home Medications Prior to Admission medications   Medication Sig Start Date End Date Taking? Authorizing Provider  aspirin 81 MG tablet Take 81 mg by mouth every morning.     [provider]  azelastine (ASTELIN) 0.1 % nasal spray Place 1 spray into both nostrils daily as needed for allergies.     [provider]  budesonide-formoterol (SYMBICORT) 80-4.5 MCG/ACT inhaler Inhale 2 puffs into the lungs 2 (two) times daily. 10/29/18   Collene Gobble, MD  Calcium Citrate-Vitamin D (CALCIUM + D PO) Take 1 tablet by mouth every morning.     [provider]  cetirizine (ZYRTEC) 10 MG tablet Take 10 mg by mouth daily.    [provider]  diclofenac (VOLTAREN) 75 MG EC tablet Take 1 tablet (75 mg total) by mouth 2 (two) times daily as needed. 11/07/22   Ronnell Freshwater, NP  diclofenac Sodium (VOLTAREN) 1 % GEL  Apply 2 g topically 4 (four) times daily. Patient not taking: Reported on 10/09/2022 07/13/21   Prosperi, Christian H, PA-C  FLUoxetine (PROZAC) 40 MG capsule Take 1 capsule (40 mg total) by mouth daily. 05/25/22   Abonza, Herb Grays, PA-C  fluticasone (FLONASE) 50 MCG/ACT nasal spray Place 2 sprays into both nostrils daily. 11/12/20   [name], PA-C  gabapentin (NEURONTIN) 300 MG capsule TAKE 1 CAPSULE BY MOUTH IN THE MORNING AND 2 IN THE EVENING 12/04/22   Ronnell Freshwater, NP  hydrochlorothiazide (MICROZIDE) 12.5 MG capsule Take 1 capsule by mouth once daily 10/16/22   Ronnell Freshwater, NP  Insulin Degludec FlexTouch 100 UNIT/ML SOPN INJECT 12 UNITS SUBCUTANEOUSLY ONCE DAILY 06/08/22   Abonza, Maritza, PA-C  ipratropium-albuterol (DUONEB) 0.5-2.5 (3) MG/3ML SOLN Inhale 3 mLs into the lungs daily as needed (wheezing).  06/25/17   [provider]  lovastatin (MEVACOR) 20 MG tablet Take 1 tablet (20 mg total) by mouth at bedtime. 12/04/22   Ronnell Freshwater, NP  metFORMIN (GLUCOPHAGE) 1000 MG tablet Take 1 tablet (1,000 mg total) by mouth 2 (two) times daily with a meal. 12/04/22   Boscia, Greer Ee, NP  methocarbamol (ROBAXIN) 500 MG tablet Take 1 tablet (500 mg total) by mouth every 8 (eight) hours as needed for muscle spasms. 03/14/22   Lorrene Reid, PA-C  nitroGLYCERIN (NITROSTAT) 0.4 MG SL tablet Place 1 tablet (0.4 mg total) under the  tongue every 5 (five) minutes as needed for chest pain. 06/29/21   Nahser, Wonda Cheng, MD  Omega-3 Fatty Acids (FISH OIL) 1000 MG CAPS Take 1,000 mg by mouth every morning.     [provider]  OZEMPIC, 1 MG/DOSE, 4 MG/3ML SOPN INJECT 0.75 MLS (1 MG TOTAL) INTO THE SKIN ONCE A WEEK (USE ONLY ONCE LOWER DOSES ARE FINISHED) 04/21/21   Lorrene Reid, PA-C  pantoprazole (PROTONIX) 40 MG tablet Take 1 tablet by mouth once daily 10/16/22   Ronnell Freshwater, NP  predniSONE (DELTASONE) 20 MG tablet Take 2 tablets by mouth x 2 days, 1 tablets x 2 days, 0.5 tablet  x 2 days 10/09/22   Lorrene Reid, PA-C  valsartan (DIOVAN) 160 MG tablet Take 1 tablet (160 mg total) by mouth daily. 12/04/22   Ronnell Freshwater, NP  verapamil (CALAN-SR) 240 MG CR tablet Take 1 tablet (240 mg total) by mouth every morning. 12/04/22   Ronnell Freshwater, NP      Allergies    Iohexol, Iodinated contrast media, and Jardiance [empagliflozin]    Review of Systems   Review of Systems  Constitutional:  Negative for chills and fever.  HENT:  Negative for ear pain and sore throat.   Eyes:  Negative for pain and visual disturbance.  Respiratory:  Negative for cough and shortness of breath.   Cardiovascular:  Negative for chest pain and palpitations.  Gastrointestinal:  Positive for abdominal pain, diarrhea, nausea and vomiting. Negative for anal bleeding and blood in stool.  Genitourinary:  Negative for dysuria and hematuria.  Musculoskeletal:  Negative for arthralgias and back pain.  Skin:  Negative for color change and rash.  Neurological:  Negative for seizures and syncope.  All other systems reviewed and are negative.   Physical Exam Updated Vital Signs BP (!) 150/69   Pulse 91   Temp 97.9 F (36.6 C)   Resp 19   SpO2 93%  Physical Exam Vitals and nursing note reviewed.  Constitutional:      General: She is not in acute distress.    Appearance: She is well-developed.  HENT:     Head: Normocephalic and atraumatic.     Mouth/Throat:     Mouth: Mucous membranes are dry.  Eyes:     Conjunctiva/sclera: Conjunctivae normal.  Cardiovascular:     Rate and Rhythm: Normal rate and regular rhythm.     Heart sounds: No murmur heard. Pulmonary:     Effort: Pulmonary effort is normal. No respiratory distress.     Breath sounds: Normal breath sounds.  Abdominal:     General: There is no distension.     Palpations: Abdomen is soft.     Tenderness: There is no abdominal tenderness. There is no guarding.  Musculoskeletal:        General: No swelling.     Cervical back:  Normal range of motion and neck supple.  Skin:    General: Skin is warm and dry.     Capillary Refill: Capillary refill takes less than 2 seconds.  Neurological:     Mental Status: She is alert.  Psychiatric:        Mood and Affect: Mood normal.     ED Results / Procedures / Treatments   Labs (all labs ordered are listed, but only abnormal results are displayed) Labs Reviewed  COMPREHENSIVE METABOLIC PANEL - Abnormal; Notable for the following components:      Result Value   Glucose, Bld 155 (*)  BUN 28 (*)    Creatinine, Ser 1.03 (*)    GFR, Estimated 59 (*)    All other components within normal limits  CBC WITH DIFFERENTIAL/PLATELET - Abnormal; Notable for the following components:   WBC 20.5 (*)    Neutro Abs 17.1 (*)    Monocytes Absolute 1.3 (*)    All other components within normal limits  LIPASE, BLOOD  URINALYSIS, ROUTINE W REFLEX MICROSCOPIC    EKG EKG Interpretation  Date/Time:  Friday December 22 2022 12:34:27 EST Ventricular Rate:  90 PR Interval:  127 QRS Duration: 133 QT Interval:  394 QTC Calculation: 483 R Axis:   -23 Text Interpretation: Sinus rhythm Left bundle branch block No significant change since last tracing Confirmed by Fredia Sorrow [phone]) on 12/22/2022 6:20:11 PM  Radiology CT ABDOMEN PELVIS WO CONTRAST  Result Date: 12/22/2022 CLINICAL DATA:  Abdominal pain, acute, nonlocalized EXAM: CT ABDOMEN AND PELVIS WITHOUT CONTRAST TECHNIQUE: Multidetector CT imaging of the abdomen and pelvis was performed following the standard protocol without IV contrast. RADIATION DOSE REDUCTION: This exam was performed according to the departmental dose-optimization program which includes automated exposure control, adjustment of the mA and/or kV according to patient size and/or use of iterative reconstruction technique. COMPARISON:  December 17, 2022, August 01, 2021, July 30, 2019 FINDINGS: Evaluation is limited by lack of IV contrast. Lower chest:  Unchanged elevation of the LEFT hemidiaphragm. Hepatobiliary: Unremarkable noncontrast appearance of the liver and gallbladder. No extrahepatic biliary ductal dilation. Pancreas: No peripancreatic fat stranding. Spleen: The visualized plane is unremarkable. Portions are outside of the field of view. Adrenals/Urinary Tract: No new suspicious findings within the adrenal glands. No hydronephrosis. No obstructing nephrolithiasis. Bladder is unremarkable. Stomach/Bowel: No definitive evidence of obstruction. There are a few prominent loops of small bowel in the lower mid abdomen without discrete transition point. Loop of small bowel measures approximately 3 cm in maximum diameter, nonspecific. Appendix is normal. Scattered diverticulosis of the sigmoid colon without evidence of acute diverticulitis. Visualized stomach is unremarkable. Vascular/Lymphatic: Atherosclerotic calcifications of the nonaneurysmal abdominal aorta. No new suspicious lymphadenopathy. There are several densely calcified lymph nodes which likely reflect the sequela of prior granulomatous infection. Reproductive: Uterus and bilateral adnexa are unremarkable. Other: No free air or free fluid. Musculoskeletal: Degenerative changes of the lumbar spine. IMPRESSION: 1. There are a few prominent loops of small bowel in the lower mid abdomen without discrete transition point. Findings are nonspecific but could reflect a low-grade enteritis. 2. Colonic diverticulosis without evidence of acute diverticulitis. Aortic Atherosclerosis (ICD10-I70.0). Electronically Signed   By: Valentino Saxon M.D.   On: 12/22/2022 15:17    Procedures Procedures    Medications Ordered in ED Medications  0.9 %  sodium chloride infusion ( Intravenous New Bag/Given 12/22/22 1901)  ondansetron (ZOFRAN-ODT) disintegrating tablet 4 mg (4 mg Oral Given 12/22/22 1228)  sodium chloride 0.9 % bolus 1,000 mL (1,000 mLs Intravenous New Bag/Given 12/22/22 1858)  ondansetron (ZOFRAN)  injection 4 mg (4 mg Intravenous Given 12/22/22 1858)  ciprofloxacin (CIPRO) tablet 500 mg (500 mg Oral Given 12/22/22 2306)  ondansetron (ZOFRAN) injection 4 mg (4 mg Intravenous Given 12/22/22 2306)    ED Course/ Medical Decision Making/ A&P                             Medical Decision Making Risk Prescription drug management.   Patient seen in triage.  And labs ordered.  Complete metabolic panel significant for  creatinine elevation at 1.03 BUN at 28 for GFR 59.  Anion gap normal.  Electrolytes normal.  Blood sugar 155.  Lipase normal at 33.  Liver function test normal.  CBC white blood count is 20.5.  It was 12.5 a week ago.  He hemoglobin 12.4 platelets are normal.  Differential just an elevation absolute neutrophils and slight elevation of absolute monocytes.  CT scan of the abdomen with a few prominent loops of small bowel in the lower mid abdomen no discrete transition point.  Findings are nonspecific but could represent a low-grade enteritis.  There is colonic diverticulosis without evidence of acute diverticulitis.  Patient's enteritis history seems feasible but there has not been a lot of diarrhea.  With the white count being 25,000.  Will probably consider treating with Cipro antibiotic and follow-up with primary care provider.  Will give patient IV fluids will give additional Zofran.  No further vomiting no diarrhea here.  Temp was 97.9 so no fevers.  Blood pressures have been good 159/68.  Not tachycardic.  Patient will probably be treated with Cipro antinausea medicine like Zofran ODT and a prescription for Imodium A-D if the diarrhea starts.  And close follow-up with primary.  Patient will get her first dose of Cipro here.  Patient unable to provide urine sample.  We did do a bladder scan she does have 200 cc of urine in her bladder.  Persistent will treat with Cipro and feel it is necessary to necessarily wait improve whether she is getting urinary tract infection or not.   Because of concerned about enteritis.     Final Clinical Impression(s) / ED Diagnoses Final diagnoses:  Left lower quadrant abdominal pain  Nausea and vomiting, unspecified vomiting type  Diarrhea, unspecified type  Enteritis    Rx / DC Orders ED Discharge Orders     None         Fredia Sorrow, MD 12/22/22 Yevette Edwards    Fredia Sorrow, MD 12/22/22 Yevette Edwards    Fredia Sorrow, MD 12/22/22 2321

## 2022-12-22 NOTE — ED Notes (Signed)
Patient is being discharged from the Urgent Care and sent to the Emergency Department via EMS . Per Jeneen Rinks, [place], patient is in need of higher level of care due to dehydration. Patient is aware and verbalizes understanding of plan of care.  Vitals:   12/22/22 1042  Pulse: (S) (!) 102  Resp: 20  Temp: 98.5 F (36.9 C)  SpO2: 95%

## 2022-12-22 NOTE — ED Triage Notes (Signed)
Pt presents with c/o left side upper and lower abdominal pain with vomiting for approx one week. EMS all reports pt c/o some dizziness at urgent care and some rebound tenderness on her abdomen. Pt is alert and oriented.

## 2022-12-22 NOTE — ED Provider Triage Note (Signed)
Emergency Medicine Provider Triage Evaluation Note  Samantha [name] , a 67 y.o. female  was evaluated in triage.  Pt complains of left-sided abdominal pain, upper and lower.  Patient was seen here on 1/21 for same complaint.  Patient had CT abdomen pelvis performed, CT chest both unremarkable.  Patient was discharged home.  Patient returned to urgent care today for same complaint.  Patient was sent to ED for further evaluation.  The patient is complaining of continued left lower quadrant and left upper quadrant abdominal pain that she believes is attributed to her diverticulitis.  Patient denies any blood in stool, fevers, diarrhea.  Patient reports last bowel movement 3 to 4 days ago.  Patient denies dysuria, flank pain.  Patient denies chest pain.  At urgent care patient was provided Zofran for nausea and vomiting.  The patient attempted to receive IV fluids however unable to establish IV line per chart review.  Patient is here for further evaluation.  Review of Systems  Positive:  Negative:   Physical Exam  SpO2 95%  Gen:   Awake, no distress   Resp:  Normal effort  MSK:   Moves extremities without difficulty  Other:  No rebound or guarding however patient does complain of left-sided abdominal pain  Medical Decision Making  Medically screening exam initiated at 12:17 PM.  Appropriate orders placed.  Samantha [name] was informed that the remainder of the evaluation will be completed by another provider, this initial triage assessment does not replace that evaluation, and the importance of remaining in the ED until their evaluation is complete.     Azucena Cecil, PA-C 12/22/22 1220

## 2022-12-22 NOTE — ED Triage Notes (Addendum)
Patient presents to UC continued flank pain, SOB, and abdominal pain.  Seen 01/21 at the ED. Pt states she now believes she has sinus infection. Has not taken anything for symptom relief. Unable to tolerate fluids.

## 2022-12-22 NOTE — Discharge Instructions (Addendum)
Patient is transferred to the emergency room via EMS for higher level of care.

## 2022-12-22 NOTE — Discharge Instructions (Addendum)
Take the Zofran every 8 hours to help prevent vomiting.  Take the Cipro as directed 2 times a day for the next 7 days.  Take Imodium A-D as needed for development of any diarrhea.  Make an appointment follow-up with your doctor.  Return for any new or worse symptoms or any persistent or recurrent vomiting.  CT scan seem to be consistent with inflammation of the small intestines this could be viral or this could be bacterial if it is bacterial the Cipro should help.

## 2022-12-28 ENCOUNTER — Ambulatory Visit (INDEPENDENT_AMBULATORY_CARE_PROVIDER_SITE_OTHER): Payer: Medicare HMO | Admitting: Nurse Practitioner

## 2022-12-28 ENCOUNTER — Encounter: Payer: Self-pay | Admitting: Nurse Practitioner

## 2022-12-28 VITALS — BP 141/82 | HR 66 | Resp 18 | Ht 70.0 in | Wt 210.0 lb

## 2022-12-28 DIAGNOSIS — K529 Noninfective gastroenteritis and colitis, unspecified: Secondary | ICD-10-CM | POA: Diagnosis not present

## 2022-12-28 DIAGNOSIS — J329 Chronic sinusitis, unspecified: Secondary | ICD-10-CM | POA: Diagnosis not present

## 2022-12-28 DIAGNOSIS — D72828 Other elevated white blood cell count: Secondary | ICD-10-CM

## 2022-12-28 MED ORDER — AZITHROMYCIN 500 MG PO TABS: 500.0000 mg | ORAL_TABLET | Freq: Every day | ORAL | 0 refills | Status: DC

## 2022-12-28 MED ORDER — METHYLPREDNISOLONE 4 MG PO TBPK: ORAL_TABLET | ORAL | 0 refills | Status: DC

## 2022-12-28 NOTE — Progress Notes (Signed)
Established patient visit   Patient: Samantha [name]   DOB: 1955/12/08   67 y.o. Female  MRN: [id number] Visit Date: 12/28/2022   Chief Complaint  Patient presents with   Follow-up    ED   URI    Bilateral ear pain, sinus pressure   Subjective    HPI HPI     Follow-up    Additional comments: ED        URI    Additional comments: Bilateral ear pain, sinus pressure      Last edited by Gemma Payor, CMA on 12/28/2022  3:13 PM.      Follow up from ER visit - was sent from Urgent care to ER  -left lower quadrant pain  -CT of abdomen and pelvis showed: IMPRESSION: 1. There are a few prominent loops of small bowel in the lower mid abdomen without discrete transition point. Findings are nonspecific but could reflect a low-grade enteritis. 2. Colonic diverticulosis without evidence of acute diverticulitis. Aortic Atherosclerosis  -WBC was significant for elevation 20.5  -initially treated with cipro in ER.  --feels some better.  --continues to have abdominal pain with diarrhea, though not quite as bad as it was.  -having nasal congestion, headache, cough, mild sore throat.   Medications: Outpatient Medications Prior to Visit  Medication Sig   aspirin 81 MG tablet Take 81 mg by mouth every morning.    azelastine (ASTELIN) 0.1 % nasal spray Place 1 spray into both nostrils daily as needed for allergies.    budesonide-formoterol (SYMBICORT) 80-4.5 MCG/ACT inhaler Inhale 2 puffs into the lungs 2 (two) times daily.   Calcium Citrate-Vitamin D (CALCIUM + D PO) Take 1 tablet by mouth every morning.    cetirizine (ZYRTEC) 10 MG tablet Take 10 mg by mouth daily.   ciprofloxacin (CIPRO) 500 MG tablet Take 1 tablet (500 mg total) by mouth 2 (two) times daily.   diclofenac (VOLTAREN) 75 MG EC tablet Take 1 tablet (75 mg total) by mouth 2 (two) times daily as needed.   diclofenac Sodium (VOLTAREN) 1 % GEL Apply 2 g topically 4 (four) times daily.   FLUoxetine (PROZAC) 40 MG  capsule Take 1 capsule (40 mg total) by mouth daily.   fluticasone (FLONASE) 50 MCG/ACT nasal spray Place 2 sprays into both nostrils daily.   gabapentin (NEURONTIN) 300 MG capsule TAKE 1 CAPSULE BY MOUTH IN THE MORNING AND 2 IN THE EVENING   hydrochlorothiazide (MICROZIDE) 12.5 MG capsule Take 1 capsule by mouth once daily   ipratropium-albuterol (DUONEB) 0.5-2.5 (3) MG/3ML SOLN Inhale 3 mLs into the lungs daily as needed (wheezing).    loperamide (IMODIUM) 2 MG capsule Take 1 capsule (2 mg total) by mouth 4 (four) times daily as needed for diarrhea or loose stools.   lovastatin (MEVACOR) 20 MG tablet Take 1 tablet (20 mg total) by mouth at bedtime.   metFORMIN (GLUCOPHAGE) 1000 MG tablet Take 1 tablet (1,000 mg total) by mouth 2 (two) times daily with a meal.   methocarbamol (ROBAXIN) 500 MG tablet Take 1 tablet (500 mg total) by mouth every 8 (eight) hours as needed for muscle spasms.   nitroGLYCERIN (NITROSTAT) 0.4 MG SL tablet Place 1 tablet (0.4 mg total) under the tongue every 5 (five) minutes as needed for chest pain.   Omega-3 Fatty Acids (FISH OIL) 1000 MG CAPS Take 1,000 mg by mouth every morning.    ondansetron (ZOFRAN-ODT) 4 MG disintegrating tablet Take 1 tablet (4 mg total) by mouth every  8 (eight) hours as needed for nausea or vomiting.   OZEMPIC, 1 MG/DOSE, 4 MG/3ML SOPN INJECT 0.75 MLS (1 MG TOTAL) INTO THE SKIN ONCE A WEEK (USE ONLY ONCE LOWER DOSES ARE FINISHED)   pantoprazole (PROTONIX) 40 MG tablet Take 1 tablet by mouth once daily   valsartan (DIOVAN) 160 MG tablet Take 1 tablet (160 mg total) by mouth daily.   verapamil (CALAN-SR) 240 MG CR tablet Take 1 tablet (240 mg total) by mouth every morning.   [DISCONTINUED] Insulin Degludec FlexTouch 100 UNIT/ML SOPN INJECT 12 UNITS SUBCUTANEOUSLY ONCE DAILY   [DISCONTINUED] predniSONE (DELTASONE) 20 MG tablet Take 2 tablets by mouth x 2 days, 1 tablets x 2 days, 0.5 tablet x 2 days   Facility-Administered Medications Prior to  Visit  Medication Dose Route Frequency Provider   nitroGLYCERIN (NITROSTAT) SL tablet 0.4 mg  0.4 mg Sublingual Q5 min PRN Lorrene Reid, PA-C    Review of Systems See HPI    Last CBC Lab Results  Component Value Date   WBC 10.1 01/16/2023   HGB 11.4 01/16/2023   HCT 35.4 01/16/2023   MCV 87 01/16/2023   MCH 28.1 01/16/2023   RDW 13.4 01/16/2023   PLT 279 0000000   Last metabolic panel Lab Results  Component Value Date   GLUCOSE 155 (H) 12/22/2022   NA 137 12/22/2022   K 3.7 12/22/2022   CL 101 12/22/2022   CO2 24 12/22/2022   BUN 28 (H) 12/22/2022   CREATININE 1.03 (H) 12/22/2022   GFRNONAA 59 (L) 12/22/2022   CALCIUM 9.2 12/22/2022   PHOS 3.2 05/12/2010   PROT 7.0 12/22/2022   ALBUMIN 3.6 12/22/2022   LABGLOB 2.4 05/18/2022   AGRATIO 1.8 05/18/2022   BILITOT 0.7 12/22/2022   ALKPHOS 63 12/22/2022   AST 16 12/22/2022   ALT 23 12/22/2022   ANIONGAP 12 12/22/2022   Last lipids Lab Results  Component Value Date   CHOL 138 05/18/2022   HDL 58 05/18/2022   LDLCALC 56 05/18/2022   TRIG 143 05/18/2022   CHOLHDL 2.4 05/18/2022   Last hemoglobin A1c Lab Results  Component Value Date   HGBA1C 6.2 09/25/2022   Last thyroid functions Lab Results  Component Value Date   TSH 2.200 07/06/2022   T3TOTAL 97 07/06/2022   T4TOTAL 5.2 07/06/2022   Last vitamin D Lab Results  Component Value Date   VD25OH 36.2 09/13/2021       Objective     Today's Vitals   12/28/22 1514  BP: (Abnormal) 141/82  Pulse: 66  Resp: 18  SpO2: 96%  Weight: 210 lb (95.3 kg)  Height: 5' 10"$  (1.778 m)   Body mass index is 30.13 kg/m.  BP Readings from Last 3 Encounters:  12/28/22 (Abnormal) 141/82  12/22/22 (Abnormal) 150/69  12/17/22 139/66    Wt Readings from Last 3 Encounters:  12/28/22 210 lb (95.3 kg)  10/09/22 213 lb (96.6 kg)  09/25/22 217 lb (98.4 kg)    Physical Exam Vitals and nursing note reviewed.  Constitutional:      Appearance: Normal  appearance. She is well-developed. She is ill-appearing.  HENT:     Head: Normocephalic and atraumatic.     Right Ear: Tympanic membrane is erythematous and bulging.     Left Ear: Tympanic membrane is erythematous and bulging.     Nose: Congestion present.     Right Turbinates: Swollen.     Left Turbinates: Swollen.     Right Sinus: Maxillary sinus tenderness  and frontal sinus tenderness present.     Left Sinus: Maxillary sinus tenderness and frontal sinus tenderness present.     Mouth/Throat:     Mouth: Mucous membranes are moist.     Pharynx: Oropharynx is clear. Posterior oropharyngeal erythema present.  Eyes:     Extraocular Movements: Extraocular movements intact.     Conjunctiva/sclera: Conjunctivae normal.     Pupils: Pupils are equal, round, and reactive to light.  Cardiovascular:     Rate and Rhythm: Normal rate and regular rhythm.     Pulses: Normal pulses.     Heart sounds: Normal heart sounds.  Pulmonary:     Effort: Pulmonary effort is normal.     Breath sounds: Normal breath sounds.  Abdominal:     General: Bowel sounds are normal. There is distension.     Palpations: Abdomen is soft. There is no mass.     Tenderness: There is abdominal tenderness. There is guarding. There is no right CVA tenderness, left CVA tenderness or rebound.     Hernia: No hernia is present.  Musculoskeletal:        General: Normal range of motion.     Cervical back: Normal range of motion and neck supple.  Lymphadenopathy:     Cervical: Cervical adenopathy present.  Skin:    General: Skin is warm and dry.     Capillary Refill: Capillary refill takes less than 2 seconds.  Neurological:     General: No focal deficit present.     Mental Status: She is alert and oriented to person, place, and time.  Psychiatric:        Mood and Affect: Mood normal.        Behavior: Behavior normal.        Thought Content: Thought content normal.        Judgment: Judgment normal.      Assessment & Plan     1. Recurrent sinusitis Start z-pack. Take as directed for 5 days. Add medrol taper. Take as directed for 6 days. Rest and increase fluids. Continue using OTC medication to control symptoms.   - azithromycin (ZITHROMAX) 500 MG tablet; Take 1 tablet (500 mg total) by mouth daily.  Dispense: 5 tablet; Refill: 0 - methylPREDNISolone (MEDROL) 4 MG TBPK tablet; Take by mouth as directed for 6 days  Dispense: 21 tablet; Refill: 0  2. Gastroenteritis Start z-pack. Take as directed for 5 days. Add medrol taper. Take as directed for 6 days. - azithromycin (ZITHROMAX) 500 MG tablet; Take 1 tablet (500 mg total) by mouth daily.  Dispense: 5 tablet; Refill: 0 - methylPREDNISolone (MEDROL) 4 MG TBPK tablet; Take by mouth as directed for 6 days  Dispense: 21 tablet; Refill: 0  3. Other elevated white blood cell (WBC) count WBC very elevated in ER. Will recheck this after completion of z-pack.  - CBC w/Diff; Future   Problem List Items Addressed This Visit       Respiratory   Recurrent sinusitis - Primary   Relevant Medications   azithromycin (ZITHROMAX) 500 MG tablet   methylPREDNISolone (MEDROL) 4 MG TBPK tablet     Digestive   Gastroenteritis   Relevant Medications   azithromycin (ZITHROMAX) 500 MG tablet   methylPREDNISolone (MEDROL) 4 MG TBPK tablet     Other   Leukocytosis   Relevant Orders   CBC w/Diff (Completed)     Return for as scheduled, check CBC a week prior to visit .  Ronnell Freshwater, NP  [facility] [phone] (phone) [phone] (fax)  [place]

## 2023-01-03 DIAGNOSIS — M170 Bilateral primary osteoarthritis of knee: Secondary | ICD-10-CM | POA: Diagnosis not present

## 2023-01-04 DIAGNOSIS — H35033 Hypertensive retinopathy, bilateral: Secondary | ICD-10-CM | POA: Diagnosis not present

## 2023-01-04 DIAGNOSIS — H5319 Other subjective visual disturbances: Secondary | ICD-10-CM | POA: Diagnosis not present

## 2023-01-04 DIAGNOSIS — H43813 Vitreous degeneration, bilateral: Secondary | ICD-10-CM | POA: Diagnosis not present

## 2023-01-04 DIAGNOSIS — H5713 Ocular pain, bilateral: Secondary | ICD-10-CM | POA: Diagnosis not present

## 2023-01-04 LAB — HM DIABETES EYE EXAM

## 2023-01-15 ENCOUNTER — Other Ambulatory Visit: Payer: Self-pay

## 2023-01-15 DIAGNOSIS — Z794 Long term (current) use of insulin: Secondary | ICD-10-CM

## 2023-01-15 MED ORDER — INSULIN DEGLUDEC FLEXTOUCH 100 UNIT/ML ~~LOC~~ SOPN: PEN_INJECTOR | SUBCUTANEOUS | 0 refills | Status: DC

## 2023-01-16 ENCOUNTER — Other Ambulatory Visit: Payer: Medicare HMO

## 2023-01-16 DIAGNOSIS — D72828 Other elevated white blood cell count: Secondary | ICD-10-CM

## 2023-01-17 LAB — CBC WITH DIFFERENTIAL/PLATELET
Basophils Absolute: 0.1 10*3/uL (ref 0.0–0.2)
Basos: 1 %
EOS (ABSOLUTE): 0.2 10*3/uL (ref 0.0–0.4)
Eos: 2 %
Hematocrit: 35.4 % (ref 34.0–46.6)
Hemoglobin: 11.4 g/dL (ref 11.1–15.9)
Immature Grans (Abs): 0 10*3/uL (ref 0.0–0.1)
Immature Granulocytes: 0 %
Lymphocytes Absolute: 3.6 10*3/uL — ABNORMAL HIGH (ref 0.7–3.1)
Lymphs: 35 %
MCH: 28.1 pg (ref 26.6–33.0)
MCHC: 32.2 g/dL (ref 31.5–35.7)
MCV: 87 fL (ref 79–97)
Monocytes Absolute: 0.7 10*3/uL (ref 0.1–0.9)
Monocytes: 7 %
Neutrophils Absolute: 5.5 10*3/uL (ref 1.4–7.0)
Neutrophils: 55 %
Platelets: 279 10*3/uL (ref 150–450)
RBC: 4.05 x10E6/uL (ref 3.77–5.28)
RDW: 13.4 % (ref 11.7–15.4)
WBC: 10.1 10*3/uL (ref 3.4–10.8)

## 2023-01-22 DIAGNOSIS — K529 Noninfective gastroenteritis and colitis, unspecified: Secondary | ICD-10-CM | POA: Insufficient documentation

## 2023-01-24 NOTE — Progress Notes (Signed)
Established patient visit   Patient: Samantha [name]   DOB: [birth year]   67 y.o. Female  MRN: [id number] Visit Date: 01/25/2023   Chief Complaint  Patient presents with   Medical Management of Chronic Issues   Subjective    HPI  Follow up  -DM 2 --HgbA1c 6.4 today  --microalbumin normal.  -hypertension  -mildly elevated at start of visit.  --sees cardiology   She denies chest pain, chest pressure, or shortness of breath. She denies headaches or visual disturbances. She denies abdominal pain, nausea, vomiting, or changes in bowel or bladder habits.     Medications: Outpatient Medications Prior to Visit  Medication Sig   aspirin 81 MG tablet Take 81 mg by mouth every morning.    azelastine (ASTELIN) 0.1 % nasal spray Place 1 spray into both nostrils daily as needed for allergies.    azithromycin (ZITHROMAX) 500 MG tablet Take 1 tablet (500 mg total) by mouth daily.   budesonide-formoterol (SYMBICORT) 80-4.5 MCG/ACT inhaler Inhale 2 puffs into the lungs 2 (two) times daily.   Calcium Citrate-Vitamin D (CALCIUM + D PO) Take 1 tablet by mouth every morning.    cetirizine (ZYRTEC) 10 MG tablet Take 10 mg by mouth daily.   ciprofloxacin (CIPRO) 500 MG tablet Take 1 tablet (500 mg total) by mouth 2 (two) times daily.   diclofenac Sodium (VOLTAREN) 1 % GEL Apply 2 g topically 4 (four) times daily.   FLUoxetine (PROZAC) 40 MG capsule Take 1 capsule (40 mg total) by mouth daily.   fluticasone (FLONASE) 50 MCG/ACT nasal spray Place 2 sprays into both nostrils daily.   gabapentin (NEURONTIN) 300 MG capsule TAKE 1 CAPSULE BY MOUTH IN THE MORNING AND 2 IN THE EVENING   ipratropium-albuterol (DUONEB) 0.5-2.5 (3) MG/3ML SOLN Inhale 3 mLs into the lungs daily as needed (wheezing).    lovastatin (MEVACOR) 20 MG tablet Take 1 tablet (20 mg total) by mouth at bedtime.   metFORMIN (GLUCOPHAGE) 1000 MG tablet Take 1 tablet (1,000 mg total) by mouth 2 (two) times daily with a meal.    methocarbamol (ROBAXIN) 500 MG tablet Take 1 tablet (500 mg total) by mouth every 8 (eight) hours as needed for muscle spasms.   nitroGLYCERIN (NITROSTAT) 0.4 MG SL tablet Place 1 tablet (0.4 mg total) under the tongue every 5 (five) minutes as needed for chest pain.   Omega-3 Fatty Acids (FISH OIL) 1000 MG CAPS Take 1,000 mg by mouth every morning.    OZEMPIC, 1 MG/DOSE, 4 MG/3ML SOPN INJECT 0.75 MLS (1 MG TOTAL) INTO THE SKIN ONCE A WEEK (USE ONLY ONCE LOWER DOSES ARE FINISHED)   pantoprazole (PROTONIX) 40 MG tablet Take 1 tablet by mouth once daily   valsartan (DIOVAN) 160 MG tablet Take 1 tablet (160 mg total) by mouth daily.   verapamil (CALAN-SR) 240 MG CR tablet Take 1 tablet (240 mg total) by mouth every morning.   [DISCONTINUED] diclofenac (VOLTAREN) 75 MG EC tablet Take 1 tablet (75 mg total) by mouth 2 (two) times daily as needed.   [DISCONTINUED] hydrochlorothiazide (MICROZIDE) 12.5 MG capsule Take 1 capsule by mouth once daily   [DISCONTINUED] Insulin Degludec FlexTouch 100 UNIT/ML SOPN INJECT 12 UNITS SUBCUTANEOUSLY ONCE DAILY   [DISCONTINUED] loperamide (IMODIUM) 2 MG capsule Take 1 capsule (2 mg total) by mouth 4 (four) times daily as needed for diarrhea or loose stools.   [DISCONTINUED] methylPREDNISolone (MEDROL) 4 MG TBPK tablet Take by mouth as directed for 6 days   [DISCONTINUED] ondansetron (  ZOFRAN-ODT) 4 MG disintegrating tablet Take 1 tablet (4 mg total) by mouth every 8 (eight) hours as needed for nausea or vomiting.   Facility-Administered Medications Prior to Visit  Medication Dose Route Frequency Provider   nitroGLYCERIN (NITROSTAT) SL tablet 0.4 mg  0.4 mg Sublingual Q5 min PRN Lorrene Reid, PA-C    Review of Systems See HPI    Last CBC Lab Results  Component Value Date   WBC 10.1 01/16/2023   HGB 11.4 01/16/2023   HCT 35.4 01/16/2023   MCV 87 01/16/2023   MCH 28.1 01/16/2023   RDW 13.4 01/16/2023   PLT 279 0000000   Last metabolic panel Lab  Results  Component Value Date   GLUCOSE 155 (H) 12/22/2022   NA 137 12/22/2022   K 3.7 12/22/2022   CL 101 12/22/2022   CO2 24 12/22/2022   BUN 28 (H) 12/22/2022   CREATININE 1.03 (H) 12/22/2022   GFRNONAA 59 (L) 12/22/2022   CALCIUM 9.2 12/22/2022   PHOS 3.2 05/12/2010   PROT 7.0 12/22/2022   ALBUMIN 3.6 12/22/2022   LABGLOB 2.4 05/18/2022   AGRATIO 1.8 05/18/2022   BILITOT 0.7 12/22/2022   ALKPHOS 63 12/22/2022   AST 16 12/22/2022   ALT 23 12/22/2022   ANIONGAP 12 12/22/2022   Last lipids Lab Results  Component Value Date   CHOL 138 05/18/2022   HDL 58 05/18/2022   LDLCALC 56 05/18/2022   TRIG 143 05/18/2022   CHOLHDL 2.4 05/18/2022   Last hemoglobin A1c Lab Results  Component Value Date   HGBA1C 6.4 01/25/2023   Last thyroid functions Lab Results  Component Value Date   TSH 2.200 07/06/2022   T3TOTAL 97 07/06/2022   T4TOTAL 5.2 07/06/2022   Last vitamin D Lab Results  Component Value Date   VD25OH 36.2 09/13/2021       Objective     Today's Vitals   01/25/23 0905 01/25/23 0924  BP: (Abnormal) 141/71 139/83  Pulse: 75   SpO2: 96%   Weight: 204 lb 1.9 oz (92.6 kg)   Height: 5\' 10"  (1.778 m)    Body mass index is 29.29 kg/m.  BP Readings from Last 3 Encounters:  01/25/23 139/83  12/28/22 (Abnormal) 141/82  12/22/22 (Abnormal) 150/69    Wt Readings from Last 3 Encounters:  01/25/23 204 lb 1.9 oz (92.6 kg)  12/28/22 210 lb (95.3 kg)  10/09/22 213 lb (96.6 kg)    Physical Exam Vitals and nursing note reviewed.  Constitutional:      Appearance: Normal appearance. She is well-developed.  HENT:     Head: Normocephalic and atraumatic.     Nose: Nose normal.     Mouth/Throat:     Mouth: Mucous membranes are moist.     Pharynx: Oropharynx is clear.  Eyes:     Extraocular Movements: Extraocular movements intact.     Conjunctiva/sclera: Conjunctivae normal.     Pupils: Pupils are equal, round, and reactive to light.  Neck:     Vascular: No  carotid bruit.  Cardiovascular:     Rate and Rhythm: Normal rate and regular rhythm.     Pulses: Normal pulses.     Heart sounds: Normal heart sounds.  Pulmonary:     Effort: Pulmonary effort is normal.     Breath sounds: Normal breath sounds.  Abdominal:     Palpations: Abdomen is soft.  Musculoskeletal:        General: Normal range of motion.     Cervical back: Normal  range of motion and neck supple.  Lymphadenopathy:     Cervical: No cervical adenopathy.  Skin:    General: Skin is warm and dry.     Capillary Refill: Capillary refill takes less than 2 seconds.  Neurological:     General: No focal deficit present.     Mental Status: She is alert and oriented to person, place, and time.  Psychiatric:        Mood and Affect: Mood normal.        Behavior: Behavior normal.        Thought Content: Thought content normal.        Judgment: Judgment normal.     Results for orders placed or performed in visit on 01/25/23  POCT glycosylated hemoglobin (Hb A1C)  Result Value Ref Range   Hemoglobin A1C     HbA1c POC (<> result, manual entry) 6.4 4.0 - 5.6 %   HbA1c, POC (prediabetic range)     HbA1c, POC (controlled diabetic range)    POCT UA - Microalbumin  Result Value Ref Range   Microalbumin Ur, POC 30 mg/L   Creatinine, POC 200 mg/dL   Albumin/Creatinine Ratio, Urine, POC 30     Assessment & Plan    Type 2 diabetes mellitus with diabetic neuropathy, with long-term current use of insulin (HCC) -     POCT glycosylated hemoglobin (Hb A1C) -     POCT UA - Microalbumin  Hypertension associated with diabetes ([place])  Hyperlipidemia associated with type 2 diabetes mellitus (HCC) -     POCT glycosylated hemoglobin (Hb A1C) -     POCT UA - Microalbumin    Problem List Items Addressed This Visit       Cardiovascular and Mediastinum   Hypertension associated with diabetes ([place])     Endocrine   Diabetes ([place]) - Primary   Relevant Orders   POCT glycosylated hemoglobin (Hb  A1C) (Completed)   POCT UA - Microalbumin (Completed)   Hyperlipidemia associated with type 2 diabetes mellitus ([place])   Relevant Orders   POCT glycosylated hemoglobin (Hb A1C) (Completed)   POCT UA - Microalbumin (Completed)     Return in about 3 months (around 04/25/2023) for diabetes with HgbA1c check - needs MWV at next possible time .      Medication Samples have been provided to the patient.  Drug name: Ozempic       Strength: 0.5mg         Qty: 2 boxes   LOT: [id number]  and   [id number] .Date: 05/26/2024  Dosing instructions: inject 0.5 mg into the skin once weekly   The patient has been instructed regarding the correct time, dose, and frequency of taking this medication, including desired effects and most common side effects.   Ronnell Freshwater 8:24 PM 02/18/2023     Ronnell Freshwater, NP  [facility] [phone] (phone) [phone] (fax)  [place]

## 2023-01-25 ENCOUNTER — Encounter: Payer: Self-pay | Admitting: Nurse Practitioner

## 2023-01-25 ENCOUNTER — Ambulatory Visit (INDEPENDENT_AMBULATORY_CARE_PROVIDER_SITE_OTHER): Payer: Medicare HMO | Admitting: Nurse Practitioner

## 2023-01-25 VITALS — BP 139/83 | HR 75 | Ht 70.0 in | Wt 204.1 lb

## 2023-01-25 DIAGNOSIS — E785 Hyperlipidemia, unspecified: Secondary | ICD-10-CM

## 2023-01-25 DIAGNOSIS — E1169 Type 2 diabetes mellitus with other specified complication: Secondary | ICD-10-CM | POA: Diagnosis not present

## 2023-01-25 DIAGNOSIS — Z794 Long term (current) use of insulin: Secondary | ICD-10-CM

## 2023-01-25 DIAGNOSIS — E1159 Type 2 diabetes mellitus with other circulatory complications: Secondary | ICD-10-CM | POA: Diagnosis not present

## 2023-01-25 DIAGNOSIS — I152 Hypertension secondary to endocrine disorders: Secondary | ICD-10-CM

## 2023-01-25 DIAGNOSIS — E1140 Type 2 diabetes mellitus with diabetic neuropathy, unspecified: Secondary | ICD-10-CM | POA: Diagnosis not present

## 2023-01-25 LAB — POCT GLYCOSYLATED HEMOGLOBIN (HGB A1C): HbA1c POC (<> result, manual entry): 6.4 % (ref 4.0–5.6)

## 2023-01-25 LAB — POCT UA - MICROALBUMIN
Albumin/Creatinine Ratio, Urine, POC: 30
Creatinine, POC: 200 mg/dL
Microalbumin Ur, POC: 30 mg/L

## 2023-01-27 ENCOUNTER — Other Ambulatory Visit: Payer: Self-pay | Admitting: Nurse Practitioner

## 2023-01-27 DIAGNOSIS — M1990 Unspecified osteoarthritis, unspecified site: Secondary | ICD-10-CM

## 2023-02-08 DIAGNOSIS — H47021 Hemorrhage in optic nerve sheath, right eye: Secondary | ICD-10-CM | POA: Diagnosis not present

## 2023-02-08 DIAGNOSIS — H04123 Dry eye syndrome of bilateral lacrimal glands: Secondary | ICD-10-CM | POA: Diagnosis not present

## 2023-02-08 DIAGNOSIS — H5713 Ocular pain, bilateral: Secondary | ICD-10-CM | POA: Diagnosis not present

## 2023-02-08 DIAGNOSIS — H35033 Hypertensive retinopathy, bilateral: Secondary | ICD-10-CM | POA: Diagnosis not present

## 2023-02-12 ENCOUNTER — Other Ambulatory Visit: Payer: Self-pay

## 2023-02-12 DIAGNOSIS — E1140 Type 2 diabetes mellitus with diabetic neuropathy, unspecified: Secondary | ICD-10-CM

## 2023-02-12 MED ORDER — INSULIN DEGLUDEC FLEXTOUCH 100 UNIT/ML ~~LOC~~ SOPN: PEN_INJECTOR | SUBCUTANEOUS | 0 refills | Status: DC

## 2023-02-18 NOTE — Assessment & Plan Note (Signed)
Stable. Continue bp medication as prescribed

## 2023-02-18 NOTE — Assessment & Plan Note (Signed)
-  Controlled. Last LDL 56. Continue current medication regimen.  

## 2023-02-18 NOTE — Assessment & Plan Note (Signed)
>>  ASSESSMENT AND PLAN FOR DIABETES WRITTEN ON 02/18/2023  8:27 PM BY BOSCIA, HEATHER E, NP  HgbA1c 6.4 today with normal urine microalbumin. Continue diabetic medication as prescribed. Reassess in 3 months

## 2023-02-18 NOTE — Assessment & Plan Note (Signed)
HgbA1c 6.4 today with normal urine microalbumin. Continue diabetic medication as prescribed. Reassess in 3 months

## 2023-02-26 ENCOUNTER — Encounter: Payer: Self-pay | Admitting: Family Medicine

## 2023-02-26 ENCOUNTER — Ambulatory Visit (INDEPENDENT_AMBULATORY_CARE_PROVIDER_SITE_OTHER): Payer: Medicare HMO | Admitting: Family Medicine

## 2023-02-26 VITALS — BP 131/78 | HR 70 | Resp 18 | Ht 70.0 in | Wt 215.0 lb

## 2023-02-26 DIAGNOSIS — Z0000 Encounter for general adult medical examination without abnormal findings: Secondary | ICD-10-CM

## 2023-02-26 DIAGNOSIS — E785 Hyperlipidemia, unspecified: Secondary | ICD-10-CM | POA: Diagnosis not present

## 2023-02-26 DIAGNOSIS — H6993 Unspecified Eustachian tube disorder, bilateral: Secondary | ICD-10-CM

## 2023-02-26 DIAGNOSIS — E1169 Type 2 diabetes mellitus with other specified complication: Secondary | ICD-10-CM | POA: Diagnosis not present

## 2023-02-26 DIAGNOSIS — E1159 Type 2 diabetes mellitus with other circulatory complications: Secondary | ICD-10-CM

## 2023-02-26 DIAGNOSIS — Z1159 Encounter for screening for other viral diseases: Secondary | ICD-10-CM

## 2023-02-26 DIAGNOSIS — I152 Hypertension secondary to endocrine disorders: Secondary | ICD-10-CM

## 2023-02-26 NOTE — Patient Instructions (Signed)
Please schedule an appointment for your fasting blood work lab appointment about 1 week before your next follow-up appointment on 04/25/2023.  It was nice to meet you today!

## 2023-02-26 NOTE — Progress Notes (Signed)
Subjective:   Samantha [name] is a 66 y.o. female who presents for Medicare Annual (Subsequent) preventive examination.  Review of Systems    Review of Systems  Musculoskeletal:  Positive for back pain and joint pain.  All other systems reviewed and are negative.   Cardiac Risk Factors include: none     Objective:    Today's Vitals   02/26/23 1603 02/26/23 1605  BP: (!) 144/72 131/78  Pulse: 70   Resp: 18   SpO2: 96%   Weight: 215 lb (97.5 kg)   Height: 5\' 10"  (1.778 m)   PainSc: 4  4   PainLoc: Knee    Body mass index is 30.85 kg/m.     02/26/2023    4:11 PM 12/22/2022   12:08 PM 12/17/2022   10:39 AM 07/25/2022    6:30 AM 08/01/2021    8:44 AM 06/30/2021    8:41 AM 08/10/2019    5:18 PM  Advanced Directives  Does Patient Have a Medical Advance Directive? No No No No No No No  Would patient like information on creating a medical advance directive? No - Patient declined No - Patient declined  No - Patient declined No - Patient declined No - Patient declined No - Patient declined    Current Medications (verified) Outpatient Encounter Medications as of 02/26/2023  Medication Sig   aspirin 81 MG tablet Take 81 mg by mouth every morning.    azelastine (ASTELIN) 0.1 % nasal spray Place 1 spray into both nostrils daily as needed for allergies.    budesonide-formoterol (SYMBICORT) 80-4.5 MCG/ACT inhaler Inhale 2 puffs into the lungs 2 (two) times daily.   Calcium Citrate-Vitamin D (CALCIUM + D PO) Take 1 tablet by mouth every morning.    cetirizine (ZYRTEC) 10 MG tablet Take 10 mg by mouth daily.   diclofenac (VOLTAREN) 75 MG EC tablet Take 1 tablet by mouth twice daily as needed   diclofenac Sodium (VOLTAREN) 1 % GEL Apply 2 g topically 4 (four) times daily.   FLUoxetine (PROZAC) 40 MG capsule Take 1 capsule (40 mg total) by mouth daily.   fluticasone (FLONASE) 50 MCG/ACT nasal spray Place 2 sprays into both nostrils daily.   gabapentin (NEURONTIN) 300 MG capsule TAKE 1  CAPSULE BY MOUTH IN THE MORNING AND 2 IN THE EVENING   hydrochlorothiazide (MICROZIDE) 12.5 MG capsule Take 1 capsule by mouth once daily   Insulin Degludec FlexTouch 100 UNIT/ML SOPN INJECT 12 UNITS SUBCUTANEOUSLY ONCE DAILY   ipratropium-albuterol (DUONEB) 0.5-2.5 (3) MG/3ML SOLN Inhale 3 mLs into the lungs daily as needed (wheezing).    lovastatin (MEVACOR) 20 MG tablet Take 1 tablet (20 mg total) by mouth at bedtime.   metFORMIN (GLUCOPHAGE) 1000 MG tablet Take 1 tablet (1,000 mg total) by mouth 2 (two) times daily with a meal.   methocarbamol (ROBAXIN) 500 MG tablet Take 1 tablet (500 mg total) by mouth every 8 (eight) hours as needed for muscle spasms.   nitroGLYCERIN (NITROSTAT) 0.4 MG SL tablet Place 1 tablet (0.4 mg total) under the tongue every 5 (five) minutes as needed for chest pain.   Omega-3 Fatty Acids (FISH OIL) 1000 MG CAPS Take 1,000 mg by mouth every morning.    OZEMPIC, 1 MG/DOSE, 4 MG/3ML SOPN INJECT 0.75 MLS (1 MG TOTAL) INTO THE SKIN ONCE A WEEK (USE ONLY ONCE LOWER DOSES ARE FINISHED)   pantoprazole (PROTONIX) 40 MG tablet Take 1 tablet by mouth once daily   valsartan (DIOVAN) 160 MG  tablet Take 1 tablet (160 mg total) by mouth daily.   verapamil (CALAN-SR) 240 MG CR tablet Take 1 tablet (240 mg total) by mouth every morning.   [DISCONTINUED] azithromycin (ZITHROMAX) 500 MG tablet Take 1 tablet (500 mg total) by mouth daily.   [DISCONTINUED] ciprofloxacin (CIPRO) 500 MG tablet Take 1 tablet (500 mg total) by mouth 2 (two) times daily.   Facility-Administered Encounter Medications as of 02/26/2023  Medication   nitroGLYCERIN (NITROSTAT) SL tablet 0.4 mg    Allergies (verified) Iohexol, Iodinated contrast media, and Jardiance [empagliflozin]   History: Past Medical History:  Diagnosis Date   Arthritis    Dyslipidemia    Heart disease    Heart murmur 2011   Left bundle branch block 2011   Type II or unspecified type diabetes mellitus without mention of  complication, not stated as uncontrolled    Unspecified essential hypertension    Past Surgical History:  Procedure Laterality Date   CATARACT EXTRACTION W/PHACO Left 07/11/2022   Procedure: CATARACT EXTRACTION PHACO AND INTRAOCULAR LENS PLACEMENT ([place]) LEFT  DIABETIC;  Surgeon: Birder Robson, MD;  Location: [facility];  Service: Ophthalmology;  Laterality: Left;  3.60 0:22.7   CATARACT EXTRACTION W/PHACO Right 07/25/2022   Procedure: CATARACT EXTRACTION PHACO AND INTRAOCULAR LENS PLACEMENT ([facility]) RIGHT DIABETIC 5.12 00:33.0;  Surgeon: Birder Robson, MD;  Location: [facility];  Service: Ophthalmology;  Laterality: Right;   KNEE ARTHROSCOPY Right 2017   LEFT HEART CATH AND CORONARY ANGIOGRAPHY N/A 06/30/2021   Procedure: LEFT HEART CATH AND CORONARY ANGIOGRAPHY;  Surgeon: Troy Sine, MD;  Location: [facility];  Service: Cardiovascular;  Laterality: N/A;   TONSILLECTOMY  1962   Family History  Problem Relation Age of Onset   Diabetes Mother    Hypertension Mother    CAD Mother    Hypertension Father    Diabetes Father    CAD Father    Diabetes Brother    Hyperlipidemia Brother    Hypertension Brother    CAD Brother    Diabetes Sister    Hypertension Sister    Social History   Socioeconomic History   Marital status: Married    Spouse name: Not on file   Number of children: 1   Years of education: Not on file   Highest education level: Not on file  Occupational History   Not on file  Tobacco Use   Smoking status: Never    Passive exposure: Never   Smokeless tobacco: Never  Vaping Use   Vaping Use: Never used  Substance and Sexual Activity   Alcohol use: No   Drug use: No   Sexual activity: Not Currently  Other Topics Concern   Not on file  Social History Narrative   Not on file   Social Determinants of Health   Financial Resource Strain: Low Risk  (02/26/2023)   Overall Financial Resource Strain (CARDIA)    Difficulty of Paying  Living Expenses: Not hard at all  Food Insecurity: No Food Insecurity (02/26/2023)   Hunger Vital Sign    Worried About Running Out of Food in the Last Year: Never true    Ran Out of Food in the Last Year: Never true  Transportation Needs: No Transportation Needs (02/26/2023)   PRAPARE - Hydrologist (Medical): No    Lack of Transportation (Non-Medical): No  Physical Activity: Insufficiently Active (02/26/2023)   Exercise Vital Sign    Days of Exercise per Week: 2  days    Minutes of Exercise per Session: 10 min  Stress: No Stress Concern Present (02/26/2023)   [place]    Feeling of Stress : Not at all  Social Connections: Moderately Isolated (02/26/2023)   Social Connection and Isolation Panel [NHANES]    Frequency of Communication with Friends and Family: More than three times a week    Frequency of Social Gatherings with Friends and Family: More than three times a week    Attends Religious Services: Never    Marine scientist or Organizations: No    Attends Music therapist: Never    Marital Status: Married    Tobacco Counseling Counseling given: Not Answered   Clinical Intake:  Pre-visit preparation completed: Yes  Pain : 0-10 Pain Score: 4  Pain Location: Knee (hip and knee) Pain Orientation: Right Pain Descriptors / Indicators: Dull, Constant Pain Onset: More than a month ago Pain Frequency: Constant Pain Relieving Factors: rest, ibuprofen Effect of Pain on Daily Activities: walking for long periods of time  Pain Relieving Factors: rest, ibuprofen  BMI - recorded: 30.85 Nutritional Status: BMI > 30  Obese Nutritional Risks: None Diabetes: Yes CBG done?: No Did pt. bring in CBG monitor from home?: No  How often do you need to have someone help you when you read instructions, pamphlets, or other written materials from your doctor or pharmacy?: 1 -  Never  Diabetic? Yes  Interpreter Needed?: No  Information entered by :: Leanord Asal, [place]   Activities of Daily Living    02/26/2023    4:08 PM 09/25/2022   10:17 AM  In your present state of health, do you have any difficulty performing the following activities:  Hearing? 0 0  Vision? 1 1  Difficulty concentrating or making decisions? 1 0  Comment Remembering   Walking or climbing stairs? 1 1  Comment arthritis pain in hip, knee and lower back   Dressing or bathing? 0 0  Doing errands, shopping? 0 0  Preparing Food and eating ? N   Using the Toilet? N   In the past six months, have you accidently leaked urine? N   Do you have problems with loss of bowel control? N   Managing your Medications? N   Managing your Finances? N   Housekeeping or managing your Housekeeping? N     Patient Care Team: Velva Harman, PA as PCP - General (Family Medicine) Nahser, Wonda Cheng, MD as PCP - Cardiology (Cardiology) Nahser, Wonda Cheng, MD as Consulting Physician (Cardiology) Collene Gobble, MD as Consulting Physician (Pulmonary Disease) [facility] Carol Ada, MD as Consulting Physician (Gastroenterology) [facility], [place] (Optometry) [facility] Clyde Canterbury, MD as Referring Physician (Otolaryngology)  Indicate any recent Medical Services you may have received from other than [facility] providers in the past year (date may be approximate).     Assessment:   This is a routine wellness examination for [place].  Hearing/Vision screen Hearing Screening - Comments:: Followed by [facility] Vision Screening - Comments:: Followed by [facility]   Dietary issues and exercise activities discussed: Current Exercise Habits: The patient does not participate in regular exercise at present, Exercise limited by: orthopedic condition(s)   Goals Addressed   None   Depression Screen    02/26/2023    4:11 PM 01/25/2023    9:07 AM  12/13/2022   10:04 AM 09/25/2022   10:16 AM  06/14/2022   11:22 AM 05/25/2022   10:10 AM 04/12/2022    1:57 PM  PHQ 2/9 Scores  PHQ - 2 Score 1 1 0 0 0 1 1  PHQ- 9 Score 5 5  1  0 12 3    Fall Risk    02/26/2023    4:07 PM 06/14/2022   11:22 AM 05/25/2022   10:10 AM 04/12/2022    1:57 PM 03/14/2022    3:26 PM  [place] in the past year? 1 0 1 1 1   Number falls in past yr:  0 1 1 1   Injury with Fall? 0 0 1 1 1   Risk for fall due to : Impaired balance/gait;Impaired mobility No Fall Risks History of fall(s) Impaired balance/gait;Impaired mobility;History of fall(s) History of fall(s);Impaired balance/gait  Follow up  Falls evaluation completed Falls evaluation completed Falls evaluation completed Falls evaluation completed    FALL RISK PREVENTION PERTAINING TO THE HOME:  Any stairs in or around the home? Yes  If so, are there any without handrails? No  Home free of loose throw rugs in walkways, pet beds, electrical cords, etc? Yes  Adequate lighting in your home to reduce risk of falls? Yes   ASSISTIVE DEVICES UTILIZED TO PREVENT FALLS:  Life alert? No  Use of a cane, walker or w/c? No  Grab bars in the bathroom? No  Shower chair or bench in shower? No  Elevated toilet seat or a handicapped toilet? No   TIMED UP AND GO:  Was the test performed? Yes .  Length of time to ambulate 10 feet: 10-15 sec.   Gait steady and fast without use of assistive device  Cognitive Function:        02/26/2023    4:07 PM 02/14/2022   11:17 AM  6CIT Screen  What Year? 0 points 0 points  What month? 0 points 0 points  What time? 0 points 0 points  Count back from 20 0 points 0 points  Months in reverse 0 points 0 points  Repeat phrase 0 points 0 points  Total Score 0 points 0 points    Immunizations Immunization History  Administered Date(s) Administered   Fluad Quad(high Dose 65+) 09/13/2020, 09/26/2021, 09/25/2022   Influenza Inj Mdck Quad Pf 08/21/2016, 09/03/2018   Influenza  Split 08/12/2014, 11/12/2015   Influenza, High Dose Seasonal PF 09/04/2019   Influenza, Seasonal, Injecte, Preservative Fre 08/12/2014, 11/12/2015, 08/21/2016, 08/31/2017   Influenza,inj,quad, With Preservative 08/21/2016   Influenza-Unspecified 08/13/2013   Moderna Sars-Covid-2 Vaccination 01/14/2020, 02/11/2020, 10/16/2020, 09/25/2022   PNEUMOCOCCAL CONJUGATE-20 01/25/2023   Pneumococcal Polysaccharide-23 11/27/2010   Pneumococcal-Unspecified 11/27/2010   Tdap 08/07/2012, 09/25/2022   Zoster Recombinat (Shingrix) 06/27/2018, 09/03/2018    TDAP status: Up to date  Flu Vaccine status: Up to date  Pneumococcal vaccine status: Up to date  Covid-19 vaccine status: Information provided on how to obtain vaccines.   Qualifies for Shingles Vaccine? Yes   Zostavax completed No   Shingrix Completed?: Yes  Screening Tests Health Maintenance  Topic Date Due   Hepatitis C Screening  Never done   FOOT EXAM  05/23/2022   COVID-19 Vaccine (5 - 2023-24 season) 07/29/2023 (Originally 11/20/2022)   INFLUENZA VACCINE  06/28/2023   HEMOGLOBIN A1C  07/26/2023   Diabetic kidney evaluation - eGFR measurement  12/23/2023   OPHTHALMOLOGY EXAM  01/05/2024   Diabetic kidney evaluation - Urine ACR  01/25/2024   Medicare Annual Wellness (AWV)  02/26/2024   MAMMOGRAM  08/03/2024  COLONOSCOPY (Pts 45-50yrs Insurance coverage will need to be confirmed)  12/03/2029   DTaP/Tdap/Td (3 - Td or Tdap) 09/25/2032   Pneumonia Vaccine 63+ Years old  Completed   DEXA SCAN  Completed   Zoster Vaccines- Shingrix  Completed   HPV VACCINES  Aged Out    Health Maintenance  Health Maintenance Due  Topic Date Due   Hepatitis C Screening  Never done   FOOT EXAM  05/23/2022    Colorectal cancer screening: Type of screening: Colonoscopy. Completed 12/04/2019. Repeat every 10 years  Mammogram status: Completed 08/03/2022. Repeat every year  Bone Density status: Completed 08/03/2022. Results reflect: Bone  density results: OSTEOPENIA. Repeat every 2 years.  Lung Cancer Screening: (Low Dose CT Chest recommended if Age 70-80 years, 30 pack-year currently smoking OR have quit w/in 15years.) does not qualify.   Additional Screening:  Hepatitis C Screening: does not qualify.  Vision Screening: Recommended annual ophthalmology exams for early detection of glaucoma and other disorders of the eye. Is the patient up to date with their annual eye exam?  Yes  Who is the provider or what is the name of the office in which the patient attends annual eye exams? [facility]  If pt is not established with a provider, would they like to be referred to a provider to establish care?  Established .   Dental Screening: Recommended annual dental exams for proper oral hygiene  Community Resource Referral / Chronic Care Management: CRR required this visit?  No   CCM required this visit?  No      Plan:    Patient up-to-date on all immunizations.  Up-to-date on mammogram, colonoscopy, bone marrow density.  Aged out of Pap smears.  Will repeat lab work at next follow-up appointment scheduled for 04/25/2023.  I have personally reviewed and noted the following in the patient's chart:   Medical and social history Use of alcohol, tobacco or illicit drugs  Current medications and supplements including opioid prescriptions. Patient is not currently taking opioid prescriptions. Functional ability and status Nutritional status Physical activity Advanced directives List of other physicians Hospitalizations, surgeries, and ER visits in previous 12 months Vitals Screenings to include cognitive, depression, and falls Referrals and appointments  In addition, I have reviewed and discussed with patient certain preventive protocols, quality metrics, and best practice recommendations. A written personalized care plan for preventive services as well as general preventive health recommendations were provided to  patient.    Return in 8 weeks (on 04/25/2023) for follow-up, fasting blood work 1 week before.  Velva Harman, PA   02/26/2023   Nurse Notes: Face to face 20 min

## 2023-02-27 ENCOUNTER — Other Ambulatory Visit: Payer: Self-pay

## 2023-02-27 DIAGNOSIS — F39 Unspecified mood [affective] disorder: Secondary | ICD-10-CM

## 2023-02-27 MED ORDER — FLUOXETINE HCL 40 MG PO CAPS: 40.0000 mg | ORAL_CAPSULE | Freq: Every day | ORAL | 1 refills | Status: DC

## 2023-03-07 DIAGNOSIS — M545 Low back pain, unspecified: Secondary | ICD-10-CM | POA: Diagnosis not present

## 2023-03-07 DIAGNOSIS — M170 Bilateral primary osteoarthritis of knee: Secondary | ICD-10-CM | POA: Diagnosis not present

## 2023-03-16 ENCOUNTER — Other Ambulatory Visit: Payer: Self-pay | Admitting: Nurse Practitioner

## 2023-03-16 DIAGNOSIS — E1169 Type 2 diabetes mellitus with other specified complication: Secondary | ICD-10-CM

## 2023-03-26 DIAGNOSIS — H5713 Ocular pain, bilateral: Secondary | ICD-10-CM | POA: Diagnosis not present

## 2023-03-26 DIAGNOSIS — H40013 Open angle with borderline findings, low risk, bilateral: Secondary | ICD-10-CM | POA: Diagnosis not present

## 2023-03-26 DIAGNOSIS — H04123 Dry eye syndrome of bilateral lacrimal glands: Secondary | ICD-10-CM | POA: Diagnosis not present

## 2023-03-26 LAB — HM DIABETES EYE EXAM

## 2023-04-04 DIAGNOSIS — M170 Bilateral primary osteoarthritis of knee: Secondary | ICD-10-CM | POA: Diagnosis not present

## 2023-04-13 ENCOUNTER — Other Ambulatory Visit: Payer: Medicare HMO

## 2023-04-13 DIAGNOSIS — Z1159 Encounter for screening for other viral diseases: Secondary | ICD-10-CM

## 2023-04-13 DIAGNOSIS — I152 Hypertension secondary to endocrine disorders: Secondary | ICD-10-CM | POA: Diagnosis not present

## 2023-04-13 DIAGNOSIS — E785 Hyperlipidemia, unspecified: Secondary | ICD-10-CM | POA: Diagnosis not present

## 2023-04-13 DIAGNOSIS — E1159 Type 2 diabetes mellitus with other circulatory complications: Secondary | ICD-10-CM | POA: Diagnosis not present

## 2023-04-13 DIAGNOSIS — E1169 Type 2 diabetes mellitus with other specified complication: Secondary | ICD-10-CM | POA: Diagnosis not present

## 2023-04-14 LAB — HEMOGLOBIN A1C
Est. average glucose Bld gHb Est-mCnc: 143 mg/dL
Hgb A1c MFr Bld: 6.6 % — ABNORMAL HIGH (ref 4.8–5.6)

## 2023-04-14 LAB — COMPREHENSIVE METABOLIC PANEL
ALT: 29 IU/L (ref 0–32)
AST: 18 IU/L (ref 0–40)
Albumin/Globulin Ratio: 1.5 (ref 1.2–2.2)
Albumin: 3.8 g/dL — ABNORMAL LOW (ref 3.9–4.9)
Alkaline Phosphatase: 65 IU/L (ref 44–121)
BUN/Creatinine Ratio: 25 (ref 12–28)
BUN: 34 mg/dL — ABNORMAL HIGH (ref 8–27)
Bilirubin Total: 0.3 mg/dL (ref 0.0–1.2)
CO2: 25 mmol/L (ref 20–29)
Calcium: 8.6 mg/dL — ABNORMAL LOW (ref 8.7–10.3)
Chloride: 102 mmol/L (ref 96–106)
Creatinine, Ser: 1.34 mg/dL — ABNORMAL HIGH (ref 0.57–1.00)
Globulin, Total: 2.6 g/dL (ref 1.5–4.5)
Glucose: 85 mg/dL (ref 70–99)
Potassium: 4.7 mmol/L (ref 3.5–5.2)
Sodium: 140 mmol/L (ref 134–144)
Total Protein: 6.4 g/dL (ref 6.0–8.5)
eGFR: 43 mL/min/{1.73_m2} — ABNORMAL LOW (ref >59)

## 2023-04-14 LAB — CBC WITH DIFFERENTIAL/PLATELET
Basophils Absolute: 0.1 10*3/uL (ref 0.0–0.2)
Basos: 1 %
EOS (ABSOLUTE): 0.2 10*3/uL (ref 0.0–0.4)
Eos: 2 %
Hematocrit: 34.7 % (ref 34.0–46.6)
Hemoglobin: 11.1 g/dL (ref 11.1–15.9)
Immature Grans (Abs): 0 10*3/uL (ref 0.0–0.1)
Immature Granulocytes: 0 %
Lymphocytes Absolute: 2.8 10*3/uL (ref 0.7–3.1)
Lymphs: 30 %
MCH: 27.6 pg (ref 26.6–33.0)
MCHC: 32 g/dL (ref 31.5–35.7)
MCV: 86 fL (ref 79–97)
Monocytes Absolute: 0.6 10*3/uL (ref 0.1–0.9)
Monocytes: 6 %
Neutrophils Absolute: 5.8 10*3/uL (ref 1.4–7.0)
Neutrophils: 61 %
Platelets: 278 10*3/uL (ref 150–450)
RBC: 4.02 x10E6/uL (ref 3.77–5.28)
RDW: 14.3 % (ref 11.7–15.4)
WBC: 9.5 10*3/uL (ref 3.4–10.8)

## 2023-04-14 LAB — LIPID PANEL
Chol/HDL Ratio: 1.9 ratio (ref 0.0–4.4)
Cholesterol, Total: 143 mg/dL (ref 100–199)
HDL: 75 mg/dL (ref >39)
LDL Chol Calc (NIH): 54 mg/dL (ref 0–99)
Triglycerides: 67 mg/dL (ref 0–149)
VLDL Cholesterol Cal: 14 mg/dL (ref 5–40)

## 2023-04-14 LAB — HEPATITIS C ANTIBODY: Hep C Virus Ab: NONREACTIVE

## 2023-04-19 DIAGNOSIS — E119 Type 2 diabetes mellitus without complications: Secondary | ICD-10-CM | POA: Diagnosis not present

## 2023-04-19 DIAGNOSIS — Z961 Presence of intraocular lens: Secondary | ICD-10-CM | POA: Diagnosis not present

## 2023-04-19 DIAGNOSIS — D3131 Benign neoplasm of right choroid: Secondary | ICD-10-CM | POA: Diagnosis not present

## 2023-04-19 DIAGNOSIS — H43813 Vitreous degeneration, bilateral: Secondary | ICD-10-CM | POA: Diagnosis not present

## 2023-04-19 DIAGNOSIS — Z0100 Encounter for examination of eyes and vision without abnormal findings: Secondary | ICD-10-CM | POA: Diagnosis not present

## 2023-04-19 LAB — HM DIABETES EYE EXAM

## 2023-04-20 ENCOUNTER — Other Ambulatory Visit: Payer: Self-pay | Admitting: Nurse Practitioner

## 2023-04-25 ENCOUNTER — Encounter: Payer: Self-pay | Admitting: Family Medicine

## 2023-04-25 ENCOUNTER — Ambulatory Visit (INDEPENDENT_AMBULATORY_CARE_PROVIDER_SITE_OTHER): Payer: Medicare HMO | Admitting: Family Medicine

## 2023-04-25 VITALS — BP 121/75 | HR 70 | Resp 18 | Ht 70.0 in | Wt 217.0 lb

## 2023-04-25 DIAGNOSIS — E1159 Type 2 diabetes mellitus with other circulatory complications: Secondary | ICD-10-CM | POA: Diagnosis not present

## 2023-04-25 DIAGNOSIS — E785 Hyperlipidemia, unspecified: Secondary | ICD-10-CM | POA: Diagnosis not present

## 2023-04-25 DIAGNOSIS — Z794 Long term (current) use of insulin: Secondary | ICD-10-CM | POA: Diagnosis not present

## 2023-04-25 DIAGNOSIS — E1140 Type 2 diabetes mellitus with diabetic neuropathy, unspecified: Secondary | ICD-10-CM | POA: Diagnosis not present

## 2023-04-25 DIAGNOSIS — I152 Hypertension secondary to endocrine disorders: Secondary | ICD-10-CM

## 2023-04-25 DIAGNOSIS — E1169 Type 2 diabetes mellitus with other specified complication: Secondary | ICD-10-CM | POA: Diagnosis not present

## 2023-04-25 MED ORDER — SEMAGLUTIDE(0.25 OR 0.5MG/DOS) 2 MG/3ML ~~LOC~~ SOPN: 0.5000 mg | PEN_INJECTOR | SUBCUTANEOUS | 0 refills | Status: DC

## 2023-04-25 MED ORDER — INSULIN DEGLUDEC FLEXTOUCH 100 UNIT/ML ~~LOC~~ SOPN: PEN_INJECTOR | SUBCUTANEOUS | 0 refills | Status: DC

## 2023-04-25 MED ORDER — OZEMPIC (0.25 OR 0.5 MG/DOSE) 2 MG/3ML ~~LOC~~ SOPN: 0.2500 mg | PEN_INJECTOR | SUBCUTANEOUS | 0 refills | Status: DC

## 2023-04-25 NOTE — Assessment & Plan Note (Signed)
Last lipid panel: LDL 54, HDL 75, triglycerides 67.  At goal.  Continue lovastatin 20 mg daily.  Will continue to monitor.

## 2023-04-25 NOTE — Assessment & Plan Note (Addendum)
A1c increased slightly from 6.4 to 6.6.  At last appointment, patient received samples of starting dose of Ozempic.  Advised patient to sign up for Ozempic co-pay card and provided a coupon card to get her the first month for $0.  Sent 1 month supply for 0.25 mg weekly injection to be filled immediately, sent to 71-month supply for 0.5 mg weekly injection to be filled in 4 weeks.  If Ozempic is too expensive, we may consider adding glipizide to her regimen.  Continue metformin 1000 mg twice daily and insulin degludec.

## 2023-04-25 NOTE — Assessment & Plan Note (Signed)
BP goal <130/80.  Stable.  Continue hydrochlorothiazide 12.5 mg daily, valsartan 160 mg daily, verapamil 240 mg daily.  Will continue to monitor.

## 2023-04-25 NOTE — Progress Notes (Signed)
Established Patient Office Visit  Subjective   Patient ID: Samantha [name], female    DOB: [birth year]  Age: 67 y.o. MRN: [id number]  Chief Complaint  Patient presents with   Diabetes    Diabetes   Samantha [name] is a 67 y.o. female presenting today for follow up of hypertension, hyperlipidemia, diabetes. Hypertension: Patient here for follow-up of elevated blood pressure.  Pt denies chest pain, SOB, dizziness, edema, syncope, fatigue or heart palpitations. Taking hydrochlorothiazide, valsartan, verapamil, reports excellent compliance with treatment. Denies side effects. Hyperlipidemia: tolerating lovastatin well with no myalgias or significant side effects.  The 10-year ASCVD risk score (Arnett DK, et al., 2019) is: 15.7% Diabetes: denies hypoglycemic events, wounds or sores that are not healing well, increased thirst or urination. Taking insulin degludec and metformin as prescribed without any side effects.  At last visit got a sample of Ozempic and tolerated it well, but she did run out after 1 month.  She would like to try Ozempic again, but it is too expensive even after insurance has been applied.   ROS Negative unless otherwise noted in HPI   Objective:     BP 121/75 (BP Location: Left Arm, Patient Position: Sitting, Cuff Size: Large)   Pulse 70   Resp 18   Ht 5\' 10"  (1.778 m)   Wt 217 lb (98.4 kg)   SpO2 96%   BMI 31.14 kg/m   Physical Exam Constitutional:      General: She is not in acute distress.    Appearance: Normal appearance.  HENT:     Head: Normocephalic and atraumatic.  Cardiovascular:     Rate and Rhythm: Normal rate and regular rhythm.     Pulses: Normal pulses.     Heart sounds: No murmur heard.    No friction rub. No gallop.  Pulmonary:     Effort: Pulmonary effort is normal. No respiratory distress.     Breath sounds: No wheezing, rhonchi or rales.  Musculoskeletal:        General: Swelling (Bilateral leg swelling, L>R, no pitting  edema) present.  Skin:    General: Skin is warm and dry.  Neurological:     Mental Status: She is alert and oriented to person, place, and time.    Diabetic Foot Form - Detailed   Diabetic Foot Exam - detailed Diabetic Foot exam was performed with the following findings: Yes 04/25/2023  2:34 PM  Visual Foot Exam completed.: Yes  Can the patient see the bottom of their feet?: Yes Are the shoes appropriate in style and fit?: Yes Is there swelling or and abnormal foot shape?: No Is there a claw toe deformity?: No Is there elevated skin temparature?: No Is there foot or ankle muscle weakness?: No Normal Range of Motion: Yes Pulse Foot Exam completed.: Yes   Right posterior Tibialias: Present Left posterior Tibialias: Present   Right Dorsalis Pedis: Present Left Dorsalis Pedis: Present  Sensory Foot Exam Completed.: Yes Semmes-Weinstein Monofilament Test R Site 1-Great Toe: Pos L Site 1-Great Toe: Neg          Assessment & Plan:  Type 2 diabetes mellitus with diabetic neuropathy, with long-term current use of insulin (HCC) Assessment & Plan: A1c increased slightly from 6.4 to 6.6.  At last appointment, patient received samples of starting dose of Ozempic.  Advised patient to sign up for Ozempic co-pay card and provided a coupon card to get her the first month for $0.  Sent 1 month supply for  0.25 mg weekly injection to be filled immediately, sent to 81-month supply for 0.5 mg weekly injection to be filled in 4 weeks.  If Ozempic is too expensive, we may consider adding glipizide to her regimen.  Continue metformin 1000 mg twice daily and insulin degludec.  Orders: -     Insulin Degludec FlexTouch; INJECT 12 UNITS SUBCUTANEOUSLY ONCE DAILY  Dispense: 15 mL; Refill: 0 -     Ozempic (0.25 or 0.5 MG/DOSE); Inject 0.25 mg into the skin once a week.  Dispense: 3 mL; Refill: 0 -     Semaglutide(0.25 or 0.5MG /DOS); Inject 0.5 mg into the skin once a week.  Dispense: 9 mL; Refill:  0  Hypertension associated with diabetes (HCC) Assessment & Plan: BP goal <130/80.  Stable.  Continue hydrochlorothiazide 12.5 mg daily, valsartan 160 mg daily, verapamil 240 mg daily.  Will continue to monitor.   Hyperlipidemia associated with type 2 diabetes mellitus (HCC) Assessment & Plan: Last lipid panel: LDL 54, HDL 75, triglycerides 67.  At goal.  Continue lovastatin 20 mg daily.  Will continue to monitor.     Return in about 3 months (around 07/26/2023) for follow-up for HTN, HLD, DM, fasting blood work 1 week before.    Samantha Quitter, PA

## 2023-04-26 ENCOUNTER — Encounter: Payer: Self-pay | Admitting: Family Medicine

## 2023-05-19 ENCOUNTER — Other Ambulatory Visit: Payer: Self-pay | Admitting: Nurse Practitioner

## 2023-05-19 DIAGNOSIS — M1990 Unspecified osteoarthritis, unspecified site: Secondary | ICD-10-CM

## 2023-05-30 ENCOUNTER — Other Ambulatory Visit: Payer: Self-pay | Admitting: Nurse Practitioner

## 2023-06-04 ENCOUNTER — Encounter (HOSPITAL_COMMUNITY): Payer: Self-pay

## 2023-06-04 ENCOUNTER — Ambulatory Visit (HOSPITAL_COMMUNITY)
Admission: EM | Admit: 2023-06-04 | Discharge: 2023-06-04 | Disposition: A | Discharge status: Home / Self Care | Payer: Medicare HMO | Attending: Internal Medicine | Admitting: Internal Medicine

## 2023-06-04 DIAGNOSIS — J0100 Acute maxillary sinusitis, unspecified: Secondary | ICD-10-CM

## 2023-06-04 MED ORDER — AMOXICILLIN-POT CLAVULANATE 875-125 MG PO TABS: 1.0000 | ORAL_TABLET | Freq: Two times a day (BID) | ORAL | 0 refills | Status: DC

## 2023-06-04 MED ORDER — GUAIFENESIN ER 600 MG PO TB12: 600.0000 mg | ORAL_TABLET | Freq: Two times a day (BID) | ORAL | 0 refills | Status: DC

## 2023-06-04 MED ORDER — BENZONATATE 100 MG PO CAPS: 100.0000 mg | ORAL_CAPSULE | Freq: Three times a day (TID) | ORAL | 0 refills | Status: DC | PRN

## 2023-06-04 NOTE — ED Triage Notes (Signed)
Pt presents to the office for sinus pain and pressure. Pt reports a cough x 3 days.

## 2023-06-04 NOTE — Discharge Instructions (Addendum)
Saline nasal spray will help with sinus congestion Please take antibiotics as recommended and please complete the course of antibiotics Please take cough medicine and Mucinex as directed. Please maintain adequate hydration If you have worsening symptoms please return to urgent care to be reevaluated

## 2023-06-06 DIAGNOSIS — M170 Bilateral primary osteoarthritis of knee: Secondary | ICD-10-CM | POA: Diagnosis not present

## 2023-06-07 NOTE — ED Provider Notes (Signed)
[facility]    CSN: [id number] Arrival date & time: 06/04/23  1530      History   Chief Complaint Chief Complaint  Patient presents with   Facial Pain   Headache   Cough    HPI Samantha [name] is a 67 y.o. female comes to the urgent care with facial pain, headache, nasal congestion and thick nasal discharge of 8 days duration.  Patient's symptoms started with clear rhinorrhea and has worsened over the course of the week.  Patient says the symptoms have worsened over the past 3 days.  Nasal discharge is thick and clear.  Patient denies any fever or chills.  She has pain around her eyes especially on the left side.  The headache is throbbing, mild with no known relieving factors.  She has been using nasal spray and over-the-counter medication to help with her symptoms but she has not seen much improvement.  Patient has cough which sounds wet but denies any sputum production.  No shortness of breath or wheezing.  Patient has postnasal drainage.  She has a history of seasonal allergies.  No nausea, vomiting or diarrhea.  No chest pain. HPI  Past Medical History:  Diagnosis Date   Arthritis    Dyslipidemia    Heart disease    Heart murmur 2011   Left bundle branch block 2011   Type II or unspecified type diabetes mellitus without mention of complication, not stated as uncontrolled    Unspecified essential hypertension     Patient Active Problem List   Diagnosis Date Noted   Gastroenteritis 01/22/2023   Neuroma of foot 02/20/2022   Unstable angina (HCC)    Arthritis 01/14/2021   Personal history of colonic polyps 01/14/2021   Hyperlipidemia associated with type 2 diabetes mellitus (HCC) 02/23/2020   Microalbuminuria due to type 2 diabetes mellitus (HCC) 02/23/2020   B12 deficiency 02/23/2020   Vitamin D deficiency 02/23/2020   Mood disorder (HCC) 02/23/2020   Degenerative disc disease, lumbar 07/19/2018   Spinal stenosis of lumbar region with neurogenic  claudication 07/19/2018   Spondylosis of lumbar spine 07/19/2018   Lumbar radiculopathy 07/05/2018   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/21/2018   Eustachian tube dysfunction, bilateral 02/21/2018   Recurrent sinusitis 02/21/2018   GERD (gastroesophageal reflux disease) 10/24/2017   Pulmonary nodule, right 08/14/2017   Obesity (BMI 30-39.9) 06/11/2017   LBBB (left bundle branch block) 08/10/2016   Leukocytosis 08/10/2016   Hypertension associated with diabetes (HCC) 08/10/2016   Type 2 diabetes mellitus with diabetic neuropathy, with long-term current use of insulin (HCC) 11/20/2015    Past Surgical History:  Procedure Laterality Date   CATARACT EXTRACTION W/PHACO Left 07/11/2022   Procedure: CATARACT EXTRACTION PHACO AND INTRAOCULAR LENS PLACEMENT (IOC) LEFT  DIABETIC;  Surgeon: Galen Manila, MD;  Location: [facility];  Service: Ophthalmology;  Laterality: Left;  3.60 0:22.7   CATARACT EXTRACTION W/PHACO Right 07/25/2022   Procedure: CATARACT EXTRACTION PHACO AND INTRAOCULAR LENS PLACEMENT (IOC) RIGHT DIABETIC 5.12 00:33.0;  Surgeon: Galen Manila, MD;  Location: [facility];  Service: Ophthalmology;  Laterality: Right;   KNEE ARTHROSCOPY Right 2017   LEFT HEART CATH AND CORONARY ANGIOGRAPHY N/A 06/30/2021   Procedure: LEFT HEART CATH AND CORONARY ANGIOGRAPHY;  Surgeon: Lennette Bihari, MD;  Location: [facility];  Service: Cardiovascular;  Laterality: N/A;   TONSILLECTOMY  1962    OB History   No obstetric history on file.  Home Medications    Prior to Admission medications   Medication Sig Start Date End Date Taking? Authorizing Provider  amoxicillin-clavulanate (AUGMENTIN) 875-125 MG tablet Take 1 tablet by mouth every 12 (twelve) hours. 06/04/23  Yes Jetaun Colbath, Britta Mccreedy, MD  aspirin 81 MG tablet Take 81 mg by mouth every morning.    Yes [provider]  benzonatate (TESSALON) 100 MG capsule Take 1 capsule (100  mg total) by mouth 3 (three) times daily as needed for cough. 06/04/23  Yes Esbeydi Manago, Britta Mccreedy, MD  Calcium Citrate-Vitamin D (CALCIUM + D PO) Take 1 tablet by mouth every morning.    Yes [provider]  cetirizine (ZYRTEC) 10 MG tablet Take 10 mg by mouth daily.   Yes [provider]  FLUoxetine (PROZAC) 40 MG capsule Take 1 capsule (40 mg total) by mouth daily. 02/27/23  Yes Saralyn Pilar A, PA  gabapentin (NEURONTIN) 300 MG capsule TAKE 1 CAPSULE BY MOUTH IN THE MORNING AND  2  CAPSULES  IN  THE  EVENING 03/19/23  Yes Saralyn Pilar A, PA  guaiFENesin (MUCINEX) 600 MG 12 hr tablet Take 1 tablet (600 mg total) by mouth 2 (two) times daily. 06/04/23  Yes Ramona Slinger, Britta Mccreedy, MD  hydrochlorothiazide (MICROZIDE) 12.5 MG capsule Take 1 capsule by mouth once daily 05/30/23   Saralyn Pilar A, PA  Insulin Degludec FlexTouch 100 UNIT/ML SOPN INJECT 12 UNITS SUBCUTANEOUSLY ONCE DAILY 04/25/23  Yes Saralyn Pilar A, PA  ipratropium-albuterol (DUONEB) 0.5-2.5 (3) MG/3ML SOLN Inhale 3 mLs into the lungs daily as needed (wheezing).  06/25/17  Yes [provider]  lovastatin (MEVACOR) 20 MG tablet TAKE 1 TABLET BY MOUTH AT BEDTIME 03/19/23  Yes Saralyn Pilar A, PA  metFORMIN (GLUCOPHAGE) 1000 MG tablet TAKE 1 TABLET BY MOUTH TWICE DAILY WITH  A  MEAL 03/19/23  Yes Saralyn Pilar A, PA  methocarbamol (ROBAXIN) 500 MG tablet Take 1 tablet (500 mg total) by mouth every 8 (eight) hours as needed for muscle spasms. 03/14/22  Yes Abonza, Maritza, PA-C  pantoprazole (PROTONIX) 40 MG tablet Take 1 tablet by mouth once daily 10/16/22  Yes Boscia, Heather E, NP  Semaglutide,0.25 or 0.5MG /DOS, (OZEMPIC, 0.25 OR 0.5 MG/DOSE,) 2 MG/3ML SOPN Inject 0.25 mg into the skin once a week. 04/25/23  Yes Melida Quitter, PA  valsartan (DIOVAN) 160 MG tablet Take 1 tablet by mouth once daily 03/19/23  Yes Edstrom, Morgan A, PA  azelastine (ASTELIN) 0.1 % nasal spray Place 1 spray into both nostrils daily as needed  for allergies.     [provider]  budesonide-formoterol (SYMBICORT) 80-4.5 MCG/ACT inhaler Inhale 2 puffs into the lungs 2 (two) times daily. 10/29/18   Leslye Peer, MD  diclofenac (VOLTAREN) 75 MG EC tablet Take 1 tablet by mouth twice daily as needed 05/21/23   Saralyn Pilar A, PA  diclofenac Sodium (VOLTAREN) 1 % GEL Apply 2 g topically 4 (four) times daily. 07/13/21   Prosperi, Christian H, PA-C  fluticasone (FLONASE) 50 MCG/ACT nasal spray Place 2 sprays into both nostrils daily. 11/12/20   Wallis Bamberg, PA-C  nitroGLYCERIN (NITROSTAT) 0.4 MG SL tablet Place 1 tablet (0.4 mg total) under the tongue every 5 (five) minutes as needed for chest pain. 06/29/21   Nahser, Deloris Ping, MD  Omega-3 Fatty Acids (FISH OIL) 1000 MG CAPS Take 1,000 mg by mouth every morning.     [provider]  Semaglutide,0.25 or 0.5MG /DOS, 2 MG/3ML SOPN Inject 0.5 mg into the skin  once a week. 05/24/23   Melida Quitter, PA  verapamil (CALAN-SR) 240 MG CR tablet TAKE 1 TABLET BY MOUTH IN THE MORNING 04/20/23   Melida Quitter, PA    Family History Family History  Problem Relation Age of Onset   Diabetes Mother    Hypertension Mother    CAD Mother    Hypertension Father    Diabetes Father    CAD Father    Diabetes Brother    Hyperlipidemia Brother    Hypertension Brother    CAD Brother    Diabetes Sister    Hypertension Sister     Social History Social History   Tobacco Use   Smoking status: Never    Passive exposure: Never   Smokeless tobacco: Never  Vaping Use   Vaping status: Never Used  Substance Use Topics   Alcohol use: No   Drug use: No     Allergies   Iohexol, Iodinated contrast media, and Jardiance [empagliflozin]   Review of Systems Review of Systems As per HPI  Physical Exam Triage Vital Signs ED Triage Vitals [06/04/23 1651]  Encounter Vitals Group     BP (!) 142/96     Systolic BP Percentile      Diastolic BP Percentile      Pulse Rate 82     Resp  18     Temp 97.7 F (36.5 C)     Temp Source Oral     SpO2 98 %     Weight      Height      Head Circumference      Peak Flow      Pain Score      Pain Loc      Pain Education      Exclude from Growth Chart    No data found.  Updated Vital Signs BP (!) 142/96 (BP Location: Left Arm)   Pulse 82   Temp 97.7 F (36.5 C) (Oral)   Resp 18   SpO2 98%   Visual Acuity Right Eye Distance:   Left Eye Distance:   Bilateral Distance:    Right Eye Near:   Left Eye Near:    Bilateral Near:     Physical Exam Vitals and nursing note reviewed.  Constitutional:      General: She is not in acute distress.    Appearance: She is well-developed. She is ill-appearing.  Eyes:     Extraocular Movements: Extraocular movements intact.     Pupils: Pupils are equal, round, and reactive to light.  Cardiovascular:     Rate and Rhythm: Normal rate and regular rhythm.     Heart sounds: Normal heart sounds.  Pulmonary:     Effort: Pulmonary effort is normal.     Breath sounds: Normal breath sounds.  Abdominal:     General: Bowel sounds are normal.     Palpations: Abdomen is soft.  Musculoskeletal:     Cervical back: Normal range of motion.  Lymphadenopathy:     Cervical: No cervical adenopathy.  Neurological:     Mental Status: She is alert.     GCS: GCS motor subscore is 6.      UC Treatments / Results  Labs (all labs ordered are listed, but only abnormal results are displayed) Labs Reviewed - No data to display  EKG   Radiology No results found.  Procedures Procedures (including critical care time)  Medications Ordered in UC Medications - No data to display  Initial  Impression / Assessment and Plan / UC Course  I have reviewed the triage vital signs and the nursing notes.  Pertinent labs & imaging results that were available during my care of the patient were reviewed by me and considered in my medical decision making (see chart for details).     1.  Acute  bacterial sinusitis (duration of symptoms about 8 days): Augmentin twice daily for 7 days Mucinex twice daily to help with thick nasal secretions Saline nasal spray recommended Tylenol or ibuprofen as needed for pain and/or fever Tessalon Perles as needed for cough Return precautions given Final Clinical Impressions(s) / UC Diagnoses   Final diagnoses:  Acute non-recurrent maxillary sinusitis     Discharge Instructions      Saline nasal spray will help with sinus congestion Please take antibiotics as recommended and please complete the course of antibiotics Please take cough medicine and Mucinex as directed. Please maintain adequate hydration If you have worsening symptoms please return to urgent care to be reevaluated   ED Prescriptions     Medication Sig Dispense Auth. Provider   amoxicillin-clavulanate (AUGMENTIN) 875-125 MG tablet Take 1 tablet by mouth every 12 (twelve) hours. 14 tablet Velecia Ovitt, Britta Mccreedy, MD   guaiFENesin (MUCINEX) 600 MG 12 hr tablet Take 1 tablet (600 mg total) by mouth 2 (two) times daily. 20 tablet Mishawn Hemann, Britta Mccreedy, MD   benzonatate (TESSALON) 100 MG capsule Take 1 capsule (100 mg total) by mouth 3 (three) times daily as needed for cough. 30 capsule Ruchy Wildrick, Britta Mccreedy, MD      PDMP not reviewed this encounter.   Merrilee Jansky, MD 06/07/23 [phone]

## 2023-06-21 ENCOUNTER — Other Ambulatory Visit: Payer: Self-pay | Admitting: Family Medicine

## 2023-06-29 DIAGNOSIS — J329 Chronic sinusitis, unspecified: Secondary | ICD-10-CM | POA: Diagnosis not present

## 2023-06-29 DIAGNOSIS — H7201 Central perforation of tympanic membrane, right ear: Secondary | ICD-10-CM | POA: Diagnosis not present

## 2023-06-29 DIAGNOSIS — R519 Headache, unspecified: Secondary | ICD-10-CM | POA: Diagnosis not present

## 2023-07-02 ENCOUNTER — Other Ambulatory Visit: Payer: Self-pay | Admitting: Family Medicine

## 2023-07-02 DIAGNOSIS — M1990 Unspecified osteoarthritis, unspecified site: Secondary | ICD-10-CM

## 2023-07-09 ENCOUNTER — Other Ambulatory Visit: Payer: Self-pay | Admitting: Family Medicine

## 2023-07-16 ENCOUNTER — Other Ambulatory Visit: Payer: Self-pay | Admitting: Family Medicine

## 2023-07-16 DIAGNOSIS — E1169 Type 2 diabetes mellitus with other specified complication: Secondary | ICD-10-CM

## 2023-07-24 ENCOUNTER — Other Ambulatory Visit: Payer: Self-pay | Admitting: Family Medicine

## 2023-07-24 DIAGNOSIS — E1159 Type 2 diabetes mellitus with other circulatory complications: Secondary | ICD-10-CM

## 2023-07-24 DIAGNOSIS — E1140 Type 2 diabetes mellitus with diabetic neuropathy, unspecified: Secondary | ICD-10-CM

## 2023-07-25 ENCOUNTER — Other Ambulatory Visit: Payer: Medicare HMO

## 2023-07-25 DIAGNOSIS — Z794 Long term (current) use of insulin: Secondary | ICD-10-CM | POA: Diagnosis not present

## 2023-07-25 DIAGNOSIS — E1140 Type 2 diabetes mellitus with diabetic neuropathy, unspecified: Secondary | ICD-10-CM | POA: Diagnosis not present

## 2023-07-25 DIAGNOSIS — E1159 Type 2 diabetes mellitus with other circulatory complications: Secondary | ICD-10-CM

## 2023-07-25 DIAGNOSIS — I152 Hypertension secondary to endocrine disorders: Secondary | ICD-10-CM | POA: Diagnosis not present

## 2023-07-26 LAB — COMPREHENSIVE METABOLIC PANEL
ALT: 16 IU/L (ref 0–32)
AST: 13 IU/L (ref 0–40)
Albumin: 4.1 g/dL (ref 3.9–4.9)
Alkaline Phosphatase: 70 IU/L (ref 44–121)
BUN/Creatinine Ratio: 23 (ref 12–28)
BUN: 29 mg/dL — ABNORMAL HIGH (ref 8–27)
Bilirubin Total: 0.3 mg/dL (ref 0.0–1.2)
CO2: 23 mmol/L (ref 20–29)
Calcium: 7.7 mg/dL — ABNORMAL LOW (ref 8.7–10.3)
Chloride: 98 mmol/L (ref 96–106)
Creatinine, Ser: 1.28 mg/dL — ABNORMAL HIGH (ref 0.57–1.00)
Globulin, Total: 2.4 g/dL (ref 1.5–4.5)
Glucose: 87 mg/dL (ref 70–99)
Potassium: 4.6 mmol/L (ref 3.5–5.2)
Sodium: 138 mmol/L (ref 134–144)
Total Protein: 6.5 g/dL (ref 6.0–8.5)
eGFR: 45 mL/min/{1.73_m2} — ABNORMAL LOW (ref >59)

## 2023-07-26 LAB — HEMOGLOBIN A1C
Est. average glucose Bld gHb Est-mCnc: 157 mg/dL
Hgb A1c MFr Bld: 7.1 % — ABNORMAL HIGH (ref 4.8–5.6)

## 2023-08-01 ENCOUNTER — Encounter: Payer: Self-pay | Admitting: Family Medicine

## 2023-08-01 ENCOUNTER — Ambulatory Visit: Payer: Medicare HMO | Admitting: Family Medicine

## 2023-08-01 ENCOUNTER — Other Ambulatory Visit: Payer: Self-pay | Admitting: Family Medicine

## 2023-08-01 VITALS — BP 125/76 | HR 88 | Resp 18 | Ht 70.0 in | Wt 215.0 lb

## 2023-08-01 DIAGNOSIS — I152 Hypertension secondary to endocrine disorders: Secondary | ICD-10-CM

## 2023-08-01 DIAGNOSIS — E1169 Type 2 diabetes mellitus with other specified complication: Secondary | ICD-10-CM

## 2023-08-01 DIAGNOSIS — M1990 Unspecified osteoarthritis, unspecified site: Secondary | ICD-10-CM

## 2023-08-01 DIAGNOSIS — F39 Unspecified mood [affective] disorder: Secondary | ICD-10-CM | POA: Diagnosis not present

## 2023-08-01 DIAGNOSIS — J0140 Acute pansinusitis, unspecified: Secondary | ICD-10-CM | POA: Diagnosis not present

## 2023-08-01 DIAGNOSIS — E785 Hyperlipidemia, unspecified: Secondary | ICD-10-CM

## 2023-08-01 DIAGNOSIS — E1159 Type 2 diabetes mellitus with other circulatory complications: Secondary | ICD-10-CM

## 2023-08-01 DIAGNOSIS — Z794 Long term (current) use of insulin: Secondary | ICD-10-CM | POA: Diagnosis not present

## 2023-08-01 DIAGNOSIS — E1140 Type 2 diabetes mellitus with diabetic neuropathy, unspecified: Secondary | ICD-10-CM

## 2023-08-01 MED ORDER — SEMAGLUTIDE(0.25 OR 0.5MG/DOS) 2 MG/3ML ~~LOC~~ SOPN
0.5000 mg | PEN_INJECTOR | SUBCUTANEOUS | 0 refills | Status: DC
Start: 2023-08-01 — End: 2023-10-15

## 2023-08-01 MED ORDER — LOVASTATIN 20 MG PO TABS
20.0000 mg | ORAL_TABLET | Freq: Every day | ORAL | 0 refills | Status: DC
Start: 2023-08-01 — End: 2024-01-31

## 2023-08-01 MED ORDER — HYDROCHLOROTHIAZIDE 12.5 MG PO CAPS
12.5000 mg | ORAL_CAPSULE | Freq: Every day | ORAL | 0 refills | Status: DC
Start: 2023-08-01 — End: 2023-09-06

## 2023-08-01 MED ORDER — AMOXICILLIN-POT CLAVULANATE 875-125 MG PO TABS
1.0000 | ORAL_TABLET | Freq: Two times a day (BID) | ORAL | 0 refills | Status: DC
Start: 2023-08-01 — End: 2023-10-07

## 2023-08-01 MED ORDER — VERAPAMIL HCL ER 240 MG PO TBCR
240.0000 mg | EXTENDED_RELEASE_TABLET | Freq: Every morning | ORAL | 0 refills | Status: DC
Start: 2023-08-01 — End: 2024-03-05

## 2023-08-01 MED ORDER — FLUOXETINE HCL 40 MG PO CAPS: 40.0000 mg | ORAL_CAPSULE | Freq: Every day | ORAL | 1 refills | Status: DC

## 2023-08-01 MED ORDER — VALSARTAN 160 MG PO TABS
160.0000 mg | ORAL_TABLET | Freq: Every day | ORAL | 0 refills | Status: DC
Start: 2023-08-01 — End: 2023-10-15

## 2023-08-01 NOTE — Assessment & Plan Note (Signed)
Last lipid panel: LDL 54, HDL 75, triglycerides 67.  At goal.  Continue lovastatin 20 mg daily.  Will continue to monitor.

## 2023-08-01 NOTE — Assessment & Plan Note (Signed)
BP goal <130/80.  Stable.  Continue hydrochlorothiazide 12.5 mg daily, valsartan 160 mg daily, verapamil 240 mg daily.  Will continue to monitor.

## 2023-08-01 NOTE — Progress Notes (Signed)
Established Patient Office Visit  Subjective   Patient ID: Samantha [name], female    DOB: 1956/08/26  Age: 67 y.o. MRN: [id number]  Chief Complaint  Patient presents with   Diabetes   Hyperlipidemia   Hypertension    HPI Samantha [name] is a 67 y.o. female presenting today for follow up of hypertension, hyperlipidemia, diabetes.  She also complains of 8/10 pain in her sinuses and ears.  About 2 weeks ago, she started getting ear fullness, runny nose, headache.  She continues to have rhinorrhea, sinus pain, nasal congestion, sore throat.  She has been taking Tylenol and ibuprofen over-the-counter without relief. Hypertension: Patient here for follow-up of elevated blood pressure.  Pt denies chest pain, SOB, dizziness, edema, syncope, fatigue or heart palpitations. Taking hydrochlorothiazide, valsartan, verapamil, reports excellent compliance with treatment. Denies side effects. Hyperlipidemia: tolerating lovastatin well with no myalgias or significant side effects.  The 10-year ASCVD risk score (Arnett DK, et al., 2019) is: 15.7% Diabetes: denies hypoglycemic events, wounds or sores that are not healing well, increased thirst or urination. Taking insulin degludec and metformin as prescribed without any side effects.    ROS Negative unless otherwise noted in HPI   Objective:     BP 125/76 (BP Location: Left Arm, Patient Position: Sitting, Cuff Size: Normal)   Pulse 88   Resp 18   Ht 5\' 10"  (1.778 m)   Wt 215 lb (97.5 kg)   SpO2 95%   BMI 30.85 kg/m   Physical Exam Constitutional:      General: She is not in acute distress.    Appearance: Normal appearance.  HENT:     Head: Normocephalic and atraumatic.  Cardiovascular:     Rate and Rhythm: Normal rate and regular rhythm.     Heart sounds: Murmur heard.     No friction rub. No gallop.  Pulmonary:     Effort: Pulmonary effort is normal. No respiratory distress.     Breath sounds: No wheezing, rhonchi or rales.   Skin:    General: Skin is warm and dry.  Neurological:     Mental Status: She is alert and oriented to person, place, and time.       08/01/2023    2:26 PM 04/25/2023    2:27 PM 02/26/2023    4:11 PM  Depression screen PHQ 2/9  Decreased Interest 3 0 1  Down, Depressed, Hopeless 2 0 0  PHQ - 2 Score 5 0 1  Altered sleeping 3 1 1   Tired, decreased energy 3 2 2   Change in appetite 3 1 0  Feeling bad or failure about yourself  2 0 0  Trouble concentrating 2 1 1   Moving slowly or fidgety/restless 2 0 0  Suicidal thoughts 0 0 0  PHQ-9 Score 20 5 5   Difficult doing work/chores Very difficult Somewhat difficult Not difficult at all      08/01/2023    2:27 PM 04/25/2023    2:27 PM 02/26/2023    4:12 PM 01/25/2023    9:08 AM  GAD 7 : Generalized Anxiety Score  Nervous, Anxious, on Edge 2 1 0 0  Control/stop worrying 2 0 0 0  Worry too much - different things 2 1 0 1  Trouble relaxing 3 1 1  0  Restless 2 1 1 1   Easily annoyed or irritable 2 0 0 0  Afraid - awful might happen 2 0  0  Total GAD 7 Score 15 4  2   Anxiety  Difficulty Very difficult Somewhat difficult Not difficult at all     Assessment & Plan:  Hypertension associated with diabetes (HCC) Assessment & Plan: BP goal <130/80.  Stable.  Continue hydrochlorothiazide 12.5 mg daily, valsartan 160 mg daily, verapamil 240 mg daily.  Will continue to monitor.  Orders: -     hydroCHLOROthiazide; Take 1 capsule (12.5 mg total) by mouth daily.  Dispense: 90 capsule; Refill: 0 -     Valsartan; Take 1 tablet (160 mg total) by mouth daily.  Dispense: 90 tablet; Refill: 0 -     Verapamil HCl ER; Take 1 tablet (240 mg total) by mouth every morning.  Dispense: 90 tablet; Refill: 0  Hyperlipidemia associated with type 2 diabetes mellitus (HCC) Assessment & Plan: Last lipid panel: LDL 54, HDL 75, triglycerides 67.  At goal.  Continue lovastatin 20 mg daily.  Will continue to monitor.  Orders: -     Lovastatin; Take 1 tablet (20 mg total)  by mouth at bedtime.  Dispense: 90 tablet; Refill: 0  Type 2 diabetes mellitus with diabetic neuropathy, with long-term current use of insulin (HCC) Assessment & Plan: Tolerating Ozempic well, she has run out of the 0.5 mg dose and will need a refill.  For now, continue Ozempic 0.5 mg weekly.  Continue metformin 1000 mg twice daily and insulin degludec.  Will continue to monitor.  If A1c remains elevated at next appointment, increase Ozempic.   Orders: -     Semaglutide(0.25 or 0.5MG /DOS); Inject 0.5 mg into the skin once a week.  Dispense: 9 mL; Refill: 0  Mood disorder (HCC) Assessment & Plan: PHQ-9 score 20, GAD-7 score 15.  Patient states that Prozac is still working very well for her, but she has had a decline in mental health due to her illness.  Continue Prozac 40 mg daily, will continue to monitor.  Orders: -     FLUoxetine HCl; Take 1 capsule (40 mg total) by mouth daily.  Dispense: 90 capsule; Refill: 1  Acute non-recurrent pansinusitis -     Amoxicillin-Pot Clavulanate; Take 1 tablet by mouth 2 (two) times daily.  Dispense: 20 tablet; Refill: 0  Will treat bacterial rhinosinusitis with course of Augmentin.  Advised to continue her over-the-counter Tylenol for 1-2 days to alleviate pain and inflammation as well.  Return in about 3 months (around 10/31/2023) for follow-up for HTN, HLD, DM, POC A1C at visit.    Melida Quitter, PA

## 2023-08-01 NOTE — Patient Instructions (Signed)
We can do your flu shot at your next appointment, or you can go to the pharmacy if you want it earlier!

## 2023-08-01 NOTE — Assessment & Plan Note (Signed)
PHQ-9 score 20, GAD-7 score 15.  Patient states that Prozac is still working very well for her, but she has had a decline in mental health due to her illness.  Continue Prozac 40 mg daily, will continue to monitor.

## 2023-08-01 NOTE — Assessment & Plan Note (Signed)
Tolerating Ozempic well, she has run out of the 0.5 mg dose and will need a refill.  For now, continue Ozempic 0.5 mg weekly.  Continue metformin 1000 mg twice daily and insulin degludec.  Will continue to monitor.  If A1c remains elevated at next appointment, increase Ozempic.

## 2023-09-05 DIAGNOSIS — M170 Bilateral primary osteoarthritis of knee: Secondary | ICD-10-CM | POA: Diagnosis not present

## 2023-09-06 ENCOUNTER — Other Ambulatory Visit: Payer: Self-pay | Admitting: Family Medicine

## 2023-09-06 DIAGNOSIS — M1990 Unspecified osteoarthritis, unspecified site: Secondary | ICD-10-CM

## 2023-09-06 DIAGNOSIS — I152 Hypertension secondary to endocrine disorders: Secondary | ICD-10-CM

## 2023-09-27 ENCOUNTER — Encounter: Payer: Self-pay | Admitting: Optometry

## 2023-09-27 DIAGNOSIS — H532 Diplopia: Secondary | ICD-10-CM | POA: Diagnosis not present

## 2023-09-27 DIAGNOSIS — H40013 Open angle with borderline findings, low risk, bilateral: Secondary | ICD-10-CM | POA: Diagnosis not present

## 2023-10-01 ENCOUNTER — Telehealth: Payer: Self-pay | Admitting: *Deleted

## 2023-10-01 NOTE — Telephone Encounter (Signed)
As long as they are routine labs and not specialty labs, that should be fine

## 2023-10-01 NOTE — Telephone Encounter (Signed)
Contacted pt and the labs they are needing are below.   ACHR binding ACHR blocking ACHR modulating [phone] CBC with Diff Antimusk antibody [id number] TSH profile   Please advise if these can be done or should she get orders from eye doctor and take to [facility] facility.

## 2023-10-01 NOTE — Telephone Encounter (Signed)
I would advise that they get orders from the eye doctor and go to a [facility] facility

## 2023-10-01 NOTE — Telephone Encounter (Signed)
Pts husband calling to set up lab appointment for patient.  He said his eye doctor is wanting patient to have labs done.  Routing to provider to be sure she is ok with ordering these. He has the list of labs

## 2023-10-01 NOTE — Telephone Encounter (Signed)
Pt informed of below.  

## 2023-10-04 DIAGNOSIS — H5713 Ocular pain, bilateral: Secondary | ICD-10-CM | POA: Diagnosis not present

## 2023-10-04 DIAGNOSIS — H532 Diplopia: Secondary | ICD-10-CM | POA: Diagnosis not present

## 2023-10-05 ENCOUNTER — Ambulatory Visit
Admission: EM | Admit: 2023-10-05 | Discharge: 2023-10-05 | Disposition: A | Discharge status: Home / Self Care | Payer: Medicare HMO | Attending: Internal Medicine | Admitting: Internal Medicine

## 2023-10-05 DIAGNOSIS — J0140 Acute pansinusitis, unspecified: Secondary | ICD-10-CM | POA: Diagnosis not present

## 2023-10-05 MED ORDER — BENZONATATE 100 MG PO CAPS: 100.0000 mg | ORAL_CAPSULE | Freq: Three times a day (TID) | ORAL | 0 refills | Status: DC | PRN

## 2023-10-05 MED ORDER — DOXYCYCLINE HYCLATE 100 MG PO CAPS
100.0000 mg | ORAL_CAPSULE | Freq: Two times a day (BID) | ORAL | 0 refills | Status: AC
Start: 2023-10-05 — End: 2023-10-12

## 2023-10-05 NOTE — Discharge Instructions (Signed)
I have prescribed you an antibiotic and a cough medication.  Please take with food to avoid stomach upset.  Follow-up if any symptoms persist or worsen.

## 2023-10-05 NOTE — ED Triage Notes (Signed)
"  I am having a lot of sinus pain/pressure". "A lot of congestion". No runny nose. Some cough. No sob. NO fever.

## 2023-10-05 NOTE — ED Provider Notes (Addendum)
[name]    CSN: [id number] Arrival date & time: 10/05/23  1228      History   Chief Complaint Chief Complaint  Patient presents with   Sinus Problem    HPI NGA Samantha [name] is a 67 y.o. female.   Patient presents with nasal congestion, sinus pain and pressure, cough that started about 2 weeks ago.  Reports this is the first time she has been seen since started.  Reports that she has significant sinus pressure in her face as well as thick nasal drainage.  Denies any fever, chest pain, shortness of breath.  Cough is dry.  Denies any known sick contacts.  She has been taking over-the-counter cold and flu medication as well as ibuprofen and Tylenol with minimal improvement.   Sinus Problem    Past Medical History:  Diagnosis Date   Arthritis    Dyslipidemia    Heart disease    Heart murmur 2011   Left bundle branch block 2011   Type II or unspecified type diabetes mellitus without mention of complication, not stated as uncontrolled    Unspecified essential hypertension     Patient Active Problem List   Diagnosis Date Noted   Gastroenteritis 01/22/2023   Neuroma of foot 02/20/2022   Unstable angina (HCC)    Arthritis 01/14/2021   History of colonic polyps 01/14/2021   Hyperlipidemia associated with type 2 diabetes mellitus (HCC) 02/23/2020   Microalbuminuria due to type 2 diabetes mellitus (HCC) 02/23/2020   B12 deficiency 02/23/2020   Vitamin D deficiency 02/23/2020   Mood disorder (HCC) 02/23/2020   Degenerative disc disease, lumbar 07/19/2018   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   Spondylosis of lumbar spine 07/19/2018   Lumbar radiculopathy 07/05/2018   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/21/2018   Eustachian tube dysfunction, bilateral 02/21/2018   Recurrent sinusitis 02/21/2018   GERD (gastroesophageal reflux disease) 10/24/2017   Pulmonary nodule, right 08/14/2017   Obesity (BMI  30-39.9) 06/11/2017   LBBB (left bundle branch block) 08/10/2016   Leukocytosis 08/10/2016   Hypertension associated with diabetes (HCC) 08/10/2016   Type 2 diabetes mellitus with diabetic neuropathy, with long-term current use of insulin (HCC) 11/20/2015    Past Surgical History:  Procedure Laterality Date   CATARACT EXTRACTION W/PHACO Left 07/11/2022   Procedure: CATARACT EXTRACTION PHACO AND INTRAOCULAR LENS PLACEMENT (IOC) LEFT  DIABETIC;  Surgeon: Galen Manila, MD;  Location: [facility];  Service: Ophthalmology;  Laterality: Left;  3.60 0:22.7   CATARACT EXTRACTION W/PHACO Right 07/25/2022   Procedure: CATARACT EXTRACTION PHACO AND INTRAOCULAR LENS PLACEMENT (IOC) RIGHT DIABETIC 5.12 00:33.0;  Surgeon: Galen Manila, MD;  Location: [facility];  Service: Ophthalmology;  Laterality: Right;   KNEE ARTHROSCOPY Right 2017   LEFT HEART CATH AND CORONARY ANGIOGRAPHY N/A 06/30/2021   Procedure: LEFT HEART CATH AND CORONARY ANGIOGRAPHY;  Surgeon: Lennette Bihari, MD;  Location: [facility];  Service: Cardiovascular;  Laterality: N/A;   TONSILLECTOMY  1962    OB History   No obstetric history on file.      Home Medications    Prior to Admission medications   Medication Sig Start Date End Date Taking? Authorizing Provider  benzonatate (TESSALON) 100 MG capsule Take 1 capsule (100 mg total) by mouth every 8 (eight) hours as needed for cough. 10/05/23  Yes Melburn Treiber, Rolly Salter E, FNP  doxycycline (VIBRAMYCIN) 100 MG capsule Take 1 capsule (100 mg total) by mouth 2 (  two) times daily for 7 days. 10/05/23 10/12/23 Yes Izaya Netherton, Acie Fredrickson, FNP  amoxicillin-clavulanate (AUGMENTIN) 875-125 MG tablet Take 1 tablet by mouth 2 (two) times daily. 08/01/23   Melida Quitter, PA  aspirin 81 MG tablet Take 81 mg by mouth every morning.     [provider]  azelastine (ASTELIN) 0.1 % nasal spray Place 1 spray into both nostrils daily as needed for allergies.     [provider]  budesonide-formoterol (SYMBICORT) 80-4.5 MCG/ACT inhaler Inhale 2 puffs into the lungs 2 (two) times daily. 10/29/18   Leslye Peer, MD  Calcium Citrate-Vitamin D (CALCIUM + D PO) Take 1 tablet by mouth every morning.     [provider]  cetirizine (ZYRTEC) 10 MG tablet Take 10 mg by mouth daily.    [provider]  diclofenac (VOLTAREN) 75 MG EC tablet Take 1 tablet by mouth twice daily as needed 09/06/23   Saralyn Pilar A, PA  diclofenac Sodium (VOLTAREN) 1 % GEL Apply 2 g topically 4 (four) times daily. 07/13/21   Prosperi, Christian H, PA-C  FLUoxetine (PROZAC) 40 MG capsule Take 1 capsule (40 mg total) by mouth daily. 08/01/23   Melida Quitter, PA  fluticasone (FLONASE) 50 MCG/ACT nasal spray Place 2 sprays into both nostrils daily. 11/12/20   Wallis Bamberg, PA-C  gabapentin (NEURONTIN) 300 MG capsule TAKE 1 CAPSULE BY MOUTH IN THE MORNING AND 2 IN THE EVENING 07/02/23   Saralyn Pilar A, PA  guaiFENesin (MUCINEX) 600 MG 12 hr tablet Take 1 tablet (600 mg total) by mouth 2 (two) times daily. 06/04/23   Merrilee Jansky, MD  hydrochlorothiazide (MICROZIDE) 12.5 MG capsule Take 1 capsule by mouth once daily 09/06/23   Saralyn Pilar A, PA  Insulin Degludec FlexTouch 100 UNIT/ML SOPN INJECT 12 UNITS SUBCUTANEOUSLY ONCE DAILY 04/25/23   Saralyn Pilar A, PA  ipratropium-albuterol (DUONEB) 0.5-2.5 (3) MG/3ML SOLN Inhale 3 mLs into the lungs daily as needed (wheezing).  06/25/17   [provider]  lovastatin (MEVACOR) 20 MG tablet Take 1 tablet (20 mg total) by mouth at bedtime. 08/01/23   Melida Quitter, PA  metFORMIN (GLUCOPHAGE) 1000 MG tablet TAKE 1 TABLET BY MOUTH TWICE DAILY WITH A MEAL 07/16/23   Saralyn Pilar A, PA  methocarbamol (ROBAXIN) 500 MG tablet Take 1 tablet (500 mg total) by mouth every 8 (eight) hours as needed for muscle spasms. 03/14/22   Mayer Masker, PA-C  nitroGLYCERIN (NITROSTAT) 0.4 MG SL tablet Place 1 tablet (0.4 mg total)  under the tongue every 5 (five) minutes as needed for chest pain. 06/29/21   Nahser, Deloris Ping, MD  Omega-3 Fatty Acids (FISH OIL) 1000 MG CAPS Take 1,000 mg by mouth every morning.     [provider]  pantoprazole (PROTONIX) 40 MG tablet Take 1 tablet by mouth once daily 10/16/22   Carlean Jews, NP  Semaglutide,0.25 or 0.5MG /DOS, 2 MG/3ML SOPN Inject 0.5 mg into the skin once a week. 08/01/23   Melida Quitter, PA  valsartan (DIOVAN) 160 MG tablet Take 1 tablet (160 mg total) by mouth daily. 08/01/23   Melida Quitter, PA  verapamil (CALAN-SR) 240 MG CR tablet Take 1 tablet (240 mg total) by mouth every morning. 08/01/23   Melida Quitter, PA    Family History Family History  Problem Relation Age of Onset   Diabetes Mother    Hypertension Mother    CAD Mother    Hypertension Father  Diabetes Father    CAD Father    Diabetes Brother    Hyperlipidemia Brother    Hypertension Brother    CAD Brother    Diabetes Sister    Hypertension Sister     Social History Social History   Tobacco Use   Smoking status: Never    Passive exposure: Never   Smokeless tobacco: Never  Vaping Use   Vaping status: Never Used  Substance Use Topics   Alcohol use: No   Drug use: No     Allergies   Iohexol, Iodinated contrast media, and Jardiance [empagliflozin]   Review of Systems Review of Systems Per HPI  Physical Exam Triage Vital Signs ED Triage Vitals  Encounter Vitals Group     BP 10/05/23 1333 (!) 143/75     Systolic BP Percentile --      Diastolic BP Percentile --      Pulse Rate 10/05/23 1333 68     Resp 10/05/23 1333 18     Temp 10/05/23 1333 (!) 97.3 F (36.3 C)     Temp Source 10/05/23 1333 Oral     SpO2 10/05/23 1333 99 %     Weight 10/05/23 1331 214 lb 15.2 oz (97.5 kg)     Height 10/05/23 1331 5\' 10"  (1.778 m)     Head Circumference --      Peak Flow --      Pain Score 10/05/23 1328 10     Pain Loc --      Pain Education --      Exclude from Growth  Chart --    No data found.  Updated Vital Signs BP (!) 143/75 (BP Location: Left Arm) Comment: "My BP is usually not that high, you can check it later if you want".  Pulse 68   Temp (!) 97.3 F (36.3 C) (Oral)   Resp 18   Ht 5\' 10"  (1.778 m)   Wt 214 lb 15.2 oz (97.5 kg)   SpO2 99%   BMI 30.84 kg/m   Visual Acuity Right Eye Distance:   Left Eye Distance:   Bilateral Distance:    Right Eye Near:   Left Eye Near:    Bilateral Near:     Physical Exam Constitutional:      General: She is not in acute distress.    Appearance: Normal appearance. She is not toxic-appearing or diaphoretic.  HENT:     Head: Normocephalic and atraumatic.     Right Ear: Tympanic membrane and ear canal normal.     Left Ear: Tympanic membrane and ear canal normal.     Nose: Congestion present.     Mouth/Throat:     Mouth: Mucous membranes are moist.     Pharynx: No posterior oropharyngeal erythema.  Eyes:     Extraocular Movements: Extraocular movements intact.     Conjunctiva/sclera: Conjunctivae normal.     Pupils: Pupils are equal, round, and reactive to light.  Cardiovascular:     Rate and Rhythm: Normal rate and regular rhythm.     Pulses: Normal pulses.     Heart sounds: Normal heart sounds.  Pulmonary:     Effort: Pulmonary effort is normal. No respiratory distress.     Breath sounds: Normal breath sounds. No stridor. No wheezing, rhonchi or rales.  Abdominal:     General: Abdomen is flat. Bowel sounds are normal.     Palpations: Abdomen is soft.  Musculoskeletal:        General: Normal range  of motion.     Cervical back: Normal range of motion.  Skin:    General: Skin is warm and dry.  Neurological:     General: No focal deficit present.     Mental Status: She is alert and oriented to person, place, and time. Mental status is at baseline.  Psychiatric:        Mood and Affect: Mood normal.        Behavior: Behavior normal.      UC Treatments / Results  Labs (all labs  ordered are listed, but only abnormal results are displayed) Labs Reviewed - No data to display  EKG   Radiology No results found.  Procedures Procedures (including critical care time)  Medications Ordered in UC Medications - No data to display  Initial Impression / Assessment and Plan / UC Course  I have reviewed the triage vital signs and the nursing notes.  Pertinent labs & imaging results that were available during my care of the patient were reviewed by me and considered in my medical decision making (see chart for details).     Suspect pansinusitis given duration of symptoms.  Suggested chest x-ray but patient declined this.  Will treat with doxycycline for upper respiratory infection given patient has had multiple doses of Augmentin over the past few months with last dose being approximately 2 months ago.  Benzonatate prescribed to take as needed for cough. Suggested chest x-ray but patient declined.  Advised to follow-up if any symptoms persist or worsen.  Patient verbalized understanding and was agreeable with plan. Final Clinical Impressions(s) / UC Diagnoses   Final diagnoses:  Acute pansinusitis, recurrence not specified     Discharge Instructions      I have prescribed you an antibiotic and a cough medication.  Please take with food to avoid stomach upset.  Follow-up if any symptoms persist or worsen.    ED Prescriptions     Medication Sig Dispense Auth. Provider   doxycycline (VIBRAMYCIN) 100 MG capsule Take 1 capsule (100 mg total) by mouth 2 (two) times daily for 7 days. 14 capsule [place], [place] E, [place]   benzonatate (TESSALON) 100 MG capsule Take 1 capsule (100 mg total) by mouth every 8 (eight) hours as needed for cough. 21 capsule [place], Acie Fredrickson, [place]      PDMP not reviewed this encounter.   Gustavus Bryant, [place] 10/05/23 1428    Gustavus Bryant, [place] 10/05/23 1429

## 2023-10-07 ENCOUNTER — Observation Stay (HOSPITAL_COMMUNITY)
Admission: EM | Admit: 2023-10-07 | Discharge: 2023-10-08 | Disposition: A | Discharge status: Home / Self Care | Payer: Medicare HMO | Attending: Internal Medicine | Admitting: Internal Medicine

## 2023-10-07 ENCOUNTER — Emergency Department (HOSPITAL_COMMUNITY): Payer: Medicare HMO

## 2023-10-07 ENCOUNTER — Encounter (HOSPITAL_COMMUNITY): Payer: Self-pay

## 2023-10-07 DIAGNOSIS — Z79899 Other long term (current) drug therapy: Secondary | ICD-10-CM | POA: Insufficient documentation

## 2023-10-07 DIAGNOSIS — S0990XA Unspecified injury of head, initial encounter: Secondary | ICD-10-CM | POA: Diagnosis not present

## 2023-10-07 DIAGNOSIS — R231 Pallor: Secondary | ICD-10-CM | POA: Diagnosis not present

## 2023-10-07 DIAGNOSIS — Z7901 Long term (current) use of anticoagulants: Secondary | ICD-10-CM | POA: Diagnosis not present

## 2023-10-07 DIAGNOSIS — I10 Essential (primary) hypertension: Secondary | ICD-10-CM | POA: Diagnosis present

## 2023-10-07 DIAGNOSIS — Z794 Long term (current) use of insulin: Secondary | ICD-10-CM | POA: Insufficient documentation

## 2023-10-07 DIAGNOSIS — J0191 Acute recurrent sinusitis, unspecified: Secondary | ICD-10-CM | POA: Diagnosis not present

## 2023-10-07 DIAGNOSIS — I152 Hypertension secondary to endocrine disorders: Secondary | ICD-10-CM

## 2023-10-07 DIAGNOSIS — J329 Chronic sinusitis, unspecified: Secondary | ICD-10-CM | POA: Diagnosis not present

## 2023-10-07 DIAGNOSIS — I119 Hypertensive heart disease without heart failure: Secondary | ICD-10-CM | POA: Insufficient documentation

## 2023-10-07 DIAGNOSIS — R55 Syncope and collapse: Principal | ICD-10-CM | POA: Insufficient documentation

## 2023-10-07 DIAGNOSIS — N179 Acute kidney failure, unspecified: Secondary | ICD-10-CM | POA: Diagnosis not present

## 2023-10-07 DIAGNOSIS — E1140 Type 2 diabetes mellitus with diabetic neuropathy, unspecified: Secondary | ICD-10-CM | POA: Insufficient documentation

## 2023-10-07 DIAGNOSIS — E1159 Type 2 diabetes mellitus with other circulatory complications: Secondary | ICD-10-CM | POA: Insufficient documentation

## 2023-10-07 DIAGNOSIS — R9389 Abnormal findings on diagnostic imaging of other specified body structures: Secondary | ICD-10-CM | POA: Diagnosis not present

## 2023-10-07 DIAGNOSIS — R918 Other nonspecific abnormal finding of lung field: Secondary | ICD-10-CM | POA: Diagnosis not present

## 2023-10-07 DIAGNOSIS — I959 Hypotension, unspecified: Secondary | ICD-10-CM | POA: Diagnosis not present

## 2023-10-07 LAB — CBG MONITORING, ED: Glucose-Capillary: 166 mg/dL — ABNORMAL HIGH (ref 70–99)

## 2023-10-07 LAB — BASIC METABOLIC PANEL
Anion gap: 13 (ref 5–15)
BUN: 37 mg/dL — ABNORMAL HIGH (ref 8–23)
CO2: 21 mmol/L — ABNORMAL LOW (ref 22–32)
Calcium: 8.7 mg/dL — ABNORMAL LOW (ref 8.9–10.3)
Chloride: 107 mmol/L (ref 98–111)
Creatinine, Ser: 1.88 mg/dL — ABNORMAL HIGH (ref 0.44–1.00)
GFR, Estimated: 29 mL/min — ABNORMAL LOW (ref >60)
Glucose, Bld: 139 mg/dL — ABNORMAL HIGH (ref 70–99)
Potassium: 4.5 mmol/L (ref 3.5–5.1)
Sodium: 141 mmol/L (ref 135–145)

## 2023-10-07 LAB — CBC
HCT: 36.4 % (ref 36.0–46.0)
Hemoglobin: 11 g/dL — ABNORMAL LOW (ref 12.0–15.0)
MCH: 27.4 pg (ref 26.0–34.0)
MCHC: 30.2 g/dL (ref 30.0–36.0)
MCV: 90.8 fL (ref 80.0–100.0)
Platelets: 328 10*3/uL (ref 150–400)
RBC: 4.01 MIL/uL (ref 3.87–5.11)
RDW: 13.4 % (ref 11.5–15.5)
WBC: 12.8 10*3/uL — ABNORMAL HIGH (ref 4.0–10.5)
nRBC: 0 % (ref 0.0–0.2)

## 2023-10-07 LAB — TROPONIN I (HIGH SENSITIVITY): Troponin I (High Sensitivity): 5 ng/L (ref <18)

## 2023-10-07 MED ORDER — LACTATED RINGERS IV BOLUS
1000.0000 mL | Freq: Once | INTRAVENOUS | Status: AC
  Administered 2023-10-07: 1000 mL via INTRAVENOUS

## 2023-10-07 MED ORDER — DOXYCYCLINE HYCLATE 100 MG PO TABS
100.0000 mg | ORAL_TABLET | Freq: Two times a day (BID) | ORAL | Status: DC
  Administered 2023-10-07 – 2023-10-08 (×2): 100 mg via ORAL
  Filled 2023-10-07 (×2): qty 1

## 2023-10-07 MED ORDER — LACTATED RINGERS IV SOLN: INTRAVENOUS | Status: AC

## 2023-10-07 MED ORDER — ONDANSETRON HCL 4 MG/2ML IJ SOLN: 4.0000 mg | Freq: Four times a day (QID) | INTRAMUSCULAR | Status: DC | PRN

## 2023-10-07 MED ORDER — ACETAMINOPHEN 325 MG PO TABS
650.0000 mg | ORAL_TABLET | Freq: Four times a day (QID) | ORAL | Status: DC | PRN
  Administered 2023-10-07 – 2023-10-08 (×3): 650 mg via ORAL
  Filled 2023-10-07 (×3): qty 2

## 2023-10-07 MED ORDER — ACETAMINOPHEN 325 MG PO TABS
650.0000 mg | ORAL_TABLET | Freq: Once | ORAL | Status: AC
  Administered 2023-10-07: 650 mg via ORAL
  Filled 2023-10-07: qty 2

## 2023-10-07 MED ORDER — INSULIN ASPART 100 UNIT/ML IJ SOLN
0.0000 [IU] | Freq: Three times a day (TID) | INTRAMUSCULAR | Status: DC
  Administered 2023-10-08: 2 [IU] via SUBCUTANEOUS

## 2023-10-07 MED ORDER — ASPIRIN 81 MG PO CHEW
324.0000 mg | CHEWABLE_TABLET | Freq: Once | ORAL | Status: AC
  Administered 2023-10-07: 324 mg via ORAL
  Filled 2023-10-07: qty 4

## 2023-10-07 MED ORDER — ONDANSETRON HCL 4 MG PO TABS: 4.0000 mg | ORAL_TABLET | Freq: Four times a day (QID) | ORAL | Status: DC | PRN

## 2023-10-07 MED ORDER — ENOXAPARIN SODIUM 40 MG/0.4ML IJ SOSY
40.0000 mg | PREFILLED_SYRINGE | INTRAMUSCULAR | Status: DC
  Administered 2023-10-08: 40 mg via SUBCUTANEOUS
  Filled 2023-10-07: qty 0.4

## 2023-10-07 MED ORDER — BENZONATATE 100 MG PO CAPS: 100.0000 mg | ORAL_CAPSULE | Freq: Three times a day (TID) | ORAL | Status: DC | PRN

## 2023-10-07 MED ORDER — SODIUM CHLORIDE 0.9% FLUSH
3.0000 mL | Freq: Two times a day (BID) | INTRAVENOUS | Status: DC
  Administered 2023-10-08: 3 mL via INTRAVENOUS

## 2023-10-07 MED ORDER — ACETAMINOPHEN 650 MG RE SUPP: 650.0000 mg | Freq: Four times a day (QID) | RECTAL | Status: DC | PRN

## 2023-10-07 NOTE — Assessment & Plan Note (Addendum)
Sinusitis / URI symptoms for past 2 weeks. Apparently h/o frequent recurrent sinusitis with multiple PCP every year for this. Will continue doxycycline started for this x2 days ago Continue tessalon PRN CXR today is neg for PNA findings. Is a bid odd though that CT head today was negative for any sinus findings... Looks like she saw Dr. Jenne Pane a few years back for recurrent sinus issues.  He notes long history of eustachian tube dysfunction. Probably should be seeing Dr. Jenne Pane still as sinus issues continue to be ongoing. Anyone worked her up for an immunodeficiency?  Dont think that either of these work ups are really appropriate for inpatient though.

## 2023-10-07 NOTE — Assessment & Plan Note (Addendum)
Suspect most likely pre-renal due to exertion + recent illness (sinus infection). IVF Strict intake and output Hold hydrochlorothiazide and ARB Repeat BMP in AM

## 2023-10-07 NOTE — ED Triage Notes (Signed)
Pt was cleaning out the house of a recently deceased family member, sitting on the hood of a car, she passed out and was caught by family, was passed out for around 1 to 2 minutes. Pt is otherwise stable but did have orthostatic vital signs.   NS   Medic vitals   64hr 100% 3l  183 bgl 18rr  20g left ac

## 2023-10-07 NOTE — ED Provider Notes (Signed)
[name] EMERG[facility]Y DEPARTMENT AT [facility] Provider Note   CSN: [id number] Arrival date & time: 10/07/23  1535     History  Chief Complaint  Patient presents with   Near Syncope    Samantha [name] is a 67 y.o. female.   Near Syncope     Pt states she was sitting outside on the front of a car.  SHe was talking to family.  Pt states the next thing she knew she passed out.  She did not have any warning.  She was not feeling well.  Pt was told that she helped to the ground.  Pt is complaing of a headache and a jaw pain.  No fever or chills.  NO vomiting or diarrhea.  Pt has been having sinus congestion recently and was diagnosed with doxycycline 2 days ago.  Home Medications Prior to Admission medications   Medication Sig Start Date End Date Taking? Authorizing Provider  amoxicillin-clavulanate (AUGMENTIN) 875-125 MG tablet Take 1 tablet by mouth 2 (two) times daily. 08/01/23   Melida Quitter, PA  aspirin 81 MG tablet Take 81 mg by mouth every morning.     [provider]  azelastine (ASTELIN) 0.1 % nasal spray Place 1 spray into both nostrils daily as needed for allergies.     [provider]  benzonatate (TESSALON) 100 MG capsule Take 1 capsule (100 mg total) by mouth every 8 (eight) hours as needed for cough. 10/05/23   Gustavus Bryant, FNP  budesonide-formoterol (SYMBICORT) 80-4.5 MCG/ACT inhaler Inhale 2 puffs into the lungs 2 (two) times daily. 10/29/18   Leslye Peer, MD  Calcium Citrate-Vitamin D (CALCIUM + D PO) Take 1 tablet by mouth every morning.     [provider]  cetirizine (ZYRTEC) 10 MG tablet Take 10 mg by mouth daily.    [provider]  diclofenac (VOLTAREN) 75 MG EC tablet Take 1 tablet by mouth twice daily as needed 09/06/23   Saralyn Pilar A, PA  diclofenac Sodium (VOLTAREN) 1 % GEL Apply 2 g topically 4 (four) times daily. 07/13/21   Prosperi, Christian H, PA-C  doxycycline (VIBRAMYCIN) 100 MG capsule  Take 1 capsule (100 mg total) by mouth 2 (two) times daily for 7 days. 10/05/23 10/12/23  Gustavus Bryant, FNP  FLUoxetine (PROZAC) 40 MG capsule Take 1 capsule (40 mg total) by mouth daily. 08/01/23   Melida Quitter, PA  fluticasone (FLONASE) 50 MCG/ACT nasal spray Place 2 sprays into both nostrils daily. 11/12/20   Wallis Bamberg, PA-C  gabapentin (NEURONTIN) 300 MG capsule TAKE 1 CAPSULE BY MOUTH IN THE MORNING AND 2 IN THE EVENING 07/02/23   Saralyn Pilar A, PA  guaiFENesin (MUCINEX) 600 MG 12 hr tablet Take 1 tablet (600 mg total) by mouth 2 (two) times daily. 06/04/23   Merrilee Jansky, MD  hydrochlorothiazide (MICROZIDE) 12.5 MG capsule Take 1 capsule by mouth once daily 09/06/23   Saralyn Pilar A, PA  Insulin Degludec FlexTouch 100 UNIT/ML SOPN INJECT 12 UNITS SUBCUTANEOUSLY ONCE DAILY 04/25/23   Saralyn Pilar A, PA  ipratropium-albuterol (DUONEB) 0.5-2.5 (3) MG/3ML SOLN Inhale 3 mLs into the lungs daily as needed (wheezing).  06/25/17   [provider]  lovastatin (MEVACOR) 20 MG tablet Take 1 tablet (20 mg total) by mouth at bedtime. 08/01/23   Melida Quitter, PA  metFORMIN (GLUCOPHAGE) 1000 MG tablet TAKE 1 TABLET BY MOUTH TWICE DAILY WITH A MEAL 07/16/23   Melida Quitter, PA  methocarbamol (ROBAXIN) 500 MG tablet Take 1 tablet (500 mg total) by mouth every 8 (eight) hours as needed for muscle spasms. 03/14/22   Mayer Masker, PA-C  nitroGLYCERIN (NITROSTAT) 0.4 MG SL tablet Place 1 tablet (0.4 mg total) under the tongue every 5 (five) minutes as needed for chest pain. 06/29/21   Nahser, Deloris Ping, MD  Omega-3 Fatty Acids (FISH OIL) 1000 MG CAPS Take 1,000 mg by mouth every morning.     [provider]  pantoprazole (PROTONIX) 40 MG tablet Take 1 tablet by mouth once daily 10/16/22   Carlean Jews, NP  Semaglutide,0.25 or 0.5MG /DOS, 2 MG/3ML SOPN Inject 0.5 mg into the skin once a week. 08/01/23   Melida Quitter, PA  valsartan (DIOVAN) 160 MG tablet Take 1 tablet (160  mg total) by mouth daily. 08/01/23   Melida Quitter, PA  verapamil (CALAN-SR) 240 MG CR tablet Take 1 tablet (240 mg total) by mouth every morning. 08/01/23   Melida Quitter, PA      Allergies    Iohexol, Iodinated contrast media, and Jardiance [empagliflozin]    Review of Systems   Review of Systems  Cardiovascular:  Positive for near-syncope.    Physical Exam Updated Vital Signs BP (!) 135/48 (BP Location: Left Arm)   Pulse (!) 57   Temp 98.9 F (37.2 C) (Oral)   Resp 17   SpO2 97%  Physical Exam Vitals and nursing note reviewed.  Constitutional:      Appearance: She is well-developed. She is ill-appearing.  HENT:     Head: Normocephalic and atraumatic.     Right Ear: External ear normal.     Left Ear: External ear normal.  Eyes:     General: No scleral icterus.       Right eye: No discharge.        Left eye: No discharge.     Conjunctiva/sclera: Conjunctivae normal.  Neck:     Trachea: No tracheal deviation.  Cardiovascular:     Rate and Rhythm: Normal rate and regular rhythm.  Pulmonary:     Effort: Pulmonary effort is normal. No respiratory distress.     Breath sounds: Normal breath sounds. No stridor. No wheezing or rales.  Abdominal:     General: Bowel sounds are normal. There is no distension.     Palpations: Abdomen is soft.     Tenderness: There is no abdominal tenderness. There is no guarding or rebound.  Musculoskeletal:        General: No tenderness or deformity.     Cervical back: Neck supple.  Skin:    General: Skin is warm and dry.     Findings: No rash.  Neurological:     General: No focal deficit present.     Mental Status: She is alert.     Cranial Nerves: No cranial nerve deficit, dysarthria or facial asymmetry.     Sensory: No sensory deficit.     Motor: No abnormal muscle tone or seizure activity.     Coordination: Coordination normal.     Comments: Able to lift both arms and legs off the bed, no facial droop, normal speech   Psychiatric:        Mood and Affect: Mood normal.     ED Results / Procedures / Treatments   Labs (all labs ordered are listed, but only abnormal results are displayed) Labs Reviewed  BASIC METABOLIC PANEL - Abnormal; Notable for the following components:  Result Value   CO2 21 (*)    Glucose, Bld 139 (*)    BUN 37 (*)    Creatinine, Ser 1.88 (*)    Calcium 8.7 (*)    GFR, Estimated 29 (*)    All other components within normal limits  CBC - Abnormal; Notable for the following components:   WBC 12.8 (*)    Hemoglobin 11.0 (*)    All other components within normal limits  CBG MONITORING, ED - Abnormal; Notable for the following components:   Glucose-Capillary 166 (*)    All other components within normal limits  URINALYSIS, ROUTINE W REFLEX MICROSCOPIC  CBG MONITORING, ED  TROPONIN I (HIGH SENSITIVITY)  TROPONIN I (HIGH SENSITIVITY)    EKG EKG Interpretation Date/Time:  Sunday October 07 2023 15:41:26 EST Ventricular Rate:  66 PR Interval:  159 QRS Duration:  135 QT Interval:  438 QTC Calculation: 459 R Axis:   25  Text Interpretation: Sinus rhythm Left bundle branch block No significant change since last tracing Confirmed by Linwood Dibbles [phone]) on 10/07/2023 3:46:33 PM  Radiology DG Chest Portable 1 View  Result Date: 10/07/2023 CLINICAL DATA:  Near-syncope EXAM: PORTABLE CHEST 1 VIEW COMPARISON:  Chest radiograph dated December 17, 2022. FINDINGS: The heart size and mediastinal contours are unchanged. Similar chronic elevation of the left hemidiaphragm with associated basilar atelectasis/scarring. No focal consolidation, pneumothorax, or sizable pleural effusion. No acute osseous abnormality. IMPRESSION: No acute cardiopulmonary findings. Similar chronic elevation of the left hemidiaphragm with left basilar atelectasis/scarring. Electronically Signed   By: Hart Robinsons M.D.   On: 10/07/2023 17:17   CT Head Wo Contrast  Result Date: 10/07/2023 CLINICAL  DATA:  Head trauma, syncope EXAM: CT HEAD WITHOUT CONTRAST CT CERVICAL SPINE WITHOUT CONTRAST TECHNIQUE: Multidetector CT imaging of the head and cervical spine was performed following the standard protocol without intravenous contrast. Multiplanar CT image reconstructions of the cervical spine were also generated. RADIATION DOSE REDUCTION: This exam was performed according to the departmental dose-optimization program which includes automated exposure control, adjustment of the mA and/or kV according to patient size and/or use of iterative reconstruction technique. COMPARISON:  03/27/2022 FINDINGS: CT HEAD FINDINGS Brain: No evidence of acute infarction, hemorrhage, hydrocephalus, extra-axial collection or mass lesion/mass effect. Vascular: No hyperdense vessel or unexpected calcification. Skull: Normal. Negative for fracture or focal lesion. Sinuses/Orbits: No acute finding. Other: None. CT CERVICAL SPINE FINDINGS Alignment: Normal. Skull base and vertebrae: No acute fracture. No primary bone lesion or focal pathologic process. Soft tissues and spinal canal: No prevertebral fluid or swelling. No visible canal hematoma. Disc levels: Mild disc space height loss and osteophytosis of C5-C7 with otherwise intact disc spaces. Upper chest: Negative. Other: None. IMPRESSION: 1. No acute intracranial pathology. 2. No fracture or static subluxation of the cervical spine. 3. Mild disc space height loss and osteophytosis of C5-C7 with otherwise intact disc spaces. Electronically Signed   By: Jearld Lesch M.D.   On: 10/07/2023 16:40   CT Cervical Spine Wo Contrast  Result Date: 10/07/2023 CLINICAL DATA:  Head trauma, syncope EXAM: CT HEAD WITHOUT CONTRAST CT CERVICAL SPINE WITHOUT CONTRAST TECHNIQUE: Multidetector CT imaging of the head and cervical spine was performed following the standard protocol without intravenous contrast. Multiplanar CT image reconstructions of the cervical spine were also generated. RADIATION  DOSE REDUCTION: This exam was performed according to the departmental dose-optimization program which includes automated exposure control, adjustment of the mA and/or kV according to patient size and/or use of iterative reconstruction  technique. COMPARISON:  03/27/2022 FINDINGS: CT HEAD FINDINGS Brain: No evidence of acute infarction, hemorrhage, hydrocephalus, extra-axial collection or mass lesion/mass effect. Vascular: No hyperdense vessel or unexpected calcification. Skull: Normal. Negative for fracture or focal lesion. Sinuses/Orbits: No acute finding. Other: None. CT CERVICAL SPINE FINDINGS Alignment: Normal. Skull base and vertebrae: No acute fracture. No primary bone lesion or focal pathologic process. Soft tissues and spinal canal: No prevertebral fluid or swelling. No visible canal hematoma. Disc levels: Mild disc space height loss and osteophytosis of C5-C7 with otherwise intact disc spaces. Upper chest: Negative. Other: None. IMPRESSION: 1. No acute intracranial pathology. 2. No fracture or static subluxation of the cervical spine. 3. Mild disc space height loss and osteophytosis of C5-C7 with otherwise intact disc spaces. Electronically Signed   By: Jearld Lesch M.D.   On: 10/07/2023 16:40    Procedures Procedures    Medications Ordered in ED Medications  insulin aspart (novoLOG) injection 0-9 Units (has no administration in time range)  aspirin chewable tablet 324 mg (324 mg Oral Given 10/07/23 1612)  acetaminophen (TYLENOL) tablet 650 mg (650 mg Oral Given 10/07/23 1742)  lactated ringers bolus 1,000 mL (1,000 mLs Intravenous New Bag/Given 10/07/23 1922)    ED Course/ Medical Decision Making/ A&P Clinical Course as of 10/07/23 1939  Sun Oct 07, 2023  1725 Basic metabolic panel(!) Creatinine increased compared to previous. [JK]  1725 CBC(!) CBC shows leukocytosis, hemoglobin stable [JK]  1725 Head CT C-spine CT chest x-ray without acute abnormalities [JK]  1901 Basic metabolic  panel(!) [JK]  1938 Case reviewed with Dr. Julian Reil [JK]    Clinical Course User Index [JK] Linwood Dibbles, MD                                 Medical Decision Making Differential diagnosis includes but not limited to cardiac dysrhythmia, cerebral hemorrhage, anemia, dehydration  Problems Addressed: Syncope and collapse: acute illness or injury that poses a threat to life or bodily functions  Amount and/or Complexity of Data Reviewed Labs: ordered. Decision-making details documented in ED Course. Radiology: ordered.  Risk OTC drugs. Decision regarding hospitalization.   Patient presented to the ED for evaluation of a syncopal episode.  Patient has had some URI symptoms recently.  Patient was getting treatment for sinusitis.  In the ED she was complaining of headache.  ED workup did show mild leukocytosis.  Neck is supple full range of motion doubt meningitis.  CT scan of her head and neck did not show any signs of cerebral hemorrhage or acute injury.  Patient's metabolic panel does show an elevated BUN and creatinine.  I think it is possible her symptoms may have been related to dehydration but considering her age I cardiac etiology is a concern.  I will consult the medical service for admission overnight observation.        Final Clinical Impression(s) / ED Diagnoses Final diagnoses:  Syncope and collapse    Rx / DC Orders ED Discharge Orders     None         Linwood Dibbles, MD 10/07/23 1939

## 2023-10-07 NOTE — H&P (Signed)
History and Physical    Patient: Samantha [name] ION:[id number] DOB: 1956-02-26 DOA: 10/07/2023 DOS: the patient was seen and examined on 10/07/2023 PCP: Melida Quitter, PA  Patient coming from: Home  Chief Complaint:  Chief Complaint  Patient presents with   Near Syncope   HPI: Samantha [name] is a 67 y.o. female with medical history significant of DM2, minimal valvular cardiac dz (normal EF, grade 1 DD, mild MR, mild AI), HTN, frequent recurrent sinus infections and long history of eustachian tube dysfunction.  Pt with URI symptoms for past couple of weeks.  Felt she was having another sinus infection.  Saw UC x2 days ago, started on doxycycline.  Today not feeling well, cleaning out home of recently deceased family member.  Sitting on front of a car outside house talking to family, had sudden onset syncope without warning.  Has headache and jaw pain.  No fever no chills, no vomiting, no diarrhea.   Review of Systems: As mentioned in the history of present illness. All other systems reviewed and are negative. Past Medical History:  Diagnosis Date   Arthritis    Dyslipidemia    Heart disease    Heart murmur 2011   Left bundle branch block 2011   Type II or unspecified type diabetes mellitus without mention of complication, not stated as uncontrolled    Unspecified essential hypertension    Past Surgical History:  Procedure Laterality Date   CATARACT EXTRACTION W/PHACO Left 07/11/2022   Procedure: CATARACT EXTRACTION PHACO AND INTRAOCULAR LENS PLACEMENT (IOC) LEFT  DIABETIC;  Surgeon: Galen Manila, MD;  Location: [facility];  Service: Ophthalmology;  Laterality: Left;  3.60 0:22.7   CATARACT EXTRACTION W/PHACO Right 07/25/2022   Procedure: CATARACT EXTRACTION PHACO AND INTRAOCULAR LENS PLACEMENT (IOC) RIGHT DIABETIC 5.12 00:33.0;  Surgeon: Galen Manila, MD;  Location: [facility];  Service: Ophthalmology;  Laterality: Right;   KNEE  ARTHROSCOPY Right 2017   LEFT HEART CATH AND CORONARY ANGIOGRAPHY N/A 06/30/2021   Procedure: LEFT HEART CATH AND CORONARY ANGIOGRAPHY;  Surgeon: Lennette Bihari, MD;  Location: [facility];  Service: Cardiovascular;  Laterality: N/A;   TONSILLECTOMY  1962   Social History:  reports that she has never smoked. She has never been exposed to tobacco smoke. She has never used smokeless tobacco. She reports that she does not drink alcohol and does not use drugs.  Allergies  Allergen Reactions   Iohexol Anaphylaxis     Code: SOB, Desc: respiratory distress-ASM, Onset Date: 24401027    Iodinated Contrast Media Other (See Comments)   Jardiance [Empagliflozin] Rash    Family History  Problem Relation Age of Onset   Diabetes Mother    Hypertension Mother    CAD Mother    Hypertension Father    Diabetes Father    CAD Father    Diabetes Brother    Hyperlipidemia Brother    Hypertension Brother    CAD Brother    Diabetes Sister    Hypertension Sister     Prior to Admission medications   Medication Sig Start Date End Date Taking? Authorizing Provider  aspirin 81 MG tablet Take 81 mg by mouth every morning.     [provider]  azelastine (ASTELIN) 0.1 % nasal spray Place 1 spray into both nostrils daily as needed for allergies.     [provider]  benzonatate (TESSALON) 100 MG capsule Take 1 capsule (100 mg total) by mouth every 8 (eight) hours as needed  for cough. 10/05/23   Gustavus Bryant, FNP  budesonide-formoterol (SYMBICORT) 80-4.5 MCG/ACT inhaler Inhale 2 puffs into the lungs 2 (two) times daily. 10/29/18   Leslye Peer, MD  Calcium Citrate-Vitamin D (CALCIUM + D PO) Take 1 tablet by mouth every morning.     [provider]  cetirizine (ZYRTEC) 10 MG tablet Take 10 mg by mouth daily.    [provider]  diclofenac (VOLTAREN) 75 MG EC tablet Take 1 tablet by mouth twice daily as needed 09/06/23   Saralyn Pilar A, PA  diclofenac Sodium  (VOLTAREN) 1 % GEL Apply 2 g topically 4 (four) times daily. 07/13/21   Prosperi, Christian H, PA-C  doxycycline (VIBRAMYCIN) 100 MG capsule Take 1 capsule (100 mg total) by mouth 2 (two) times daily for 7 days. 10/05/23 10/12/23  Gustavus Bryant, FNP  FLUoxetine (PROZAC) 40 MG capsule Take 1 capsule (40 mg total) by mouth daily. 08/01/23   Melida Quitter, PA  fluticasone (FLONASE) 50 MCG/ACT nasal spray Place 2 sprays into both nostrils daily. 11/12/20   Wallis Bamberg, PA-C  gabapentin (NEURONTIN) 300 MG capsule TAKE 1 CAPSULE BY MOUTH IN THE MORNING AND 2 IN THE EVENING 07/02/23   Saralyn Pilar A, PA  guaiFENesin (MUCINEX) 600 MG 12 hr tablet Take 1 tablet (600 mg total) by mouth 2 (two) times daily. 06/04/23   Merrilee Jansky, MD  hydrochlorothiazide (MICROZIDE) 12.5 MG capsule Take 1 capsule by mouth once daily 09/06/23   Saralyn Pilar A, PA  Insulin Degludec FlexTouch 100 UNIT/ML SOPN INJECT 12 UNITS SUBCUTANEOUSLY ONCE DAILY 04/25/23   Saralyn Pilar A, PA  ipratropium-albuterol (DUONEB) 0.5-2.5 (3) MG/3ML SOLN Inhale 3 mLs into the lungs daily as needed (wheezing).  06/25/17   [provider]  lovastatin (MEVACOR) 20 MG tablet Take 1 tablet (20 mg total) by mouth at bedtime. 08/01/23   Melida Quitter, PA  metFORMIN (GLUCOPHAGE) 1000 MG tablet TAKE 1 TABLET BY MOUTH TWICE DAILY WITH A MEAL 07/16/23   Saralyn Pilar A, PA  methocarbamol (ROBAXIN) 500 MG tablet Take 1 tablet (500 mg total) by mouth every 8 (eight) hours as needed for muscle spasms. 03/14/22   Mayer Masker, PA-C  nitroGLYCERIN (NITROSTAT) 0.4 MG SL tablet Place 1 tablet (0.4 mg total) under the tongue every 5 (five) minutes as needed for chest pain. 06/29/21   Nahser, Deloris Ping, MD  Omega-3 Fatty Acids (FISH OIL) 1000 MG CAPS Take 1,000 mg by mouth every morning.     [provider]  pantoprazole (PROTONIX) 40 MG tablet Take 1 tablet by mouth once daily 10/16/22   Carlean Jews, NP  Semaglutide,0.25 or 0.5MG /DOS,  2 MG/3ML SOPN Inject 0.5 mg into the skin once a week. 08/01/23   Melida Quitter, PA  valsartan (DIOVAN) 160 MG tablet Take 1 tablet (160 mg total) by mouth daily. 08/01/23   Melida Quitter, PA  verapamil (CALAN-SR) 240 MG CR tablet Take 1 tablet (240 mg total) by mouth every morning. 08/01/23   Melida Quitter, PA    Physical Exam: Vitals:   10/07/23 1545 10/07/23 1845 10/07/23 1915  BP: (!) 126/49  (!) 135/48  Pulse: 68 60 (!) 57  Resp: 18 15 17   Temp: 98.2 F (36.8 C)  98.9 F (37.2 C)  TempSrc:   Oral  SpO2: 100% 100% 97%   Constitutional: NAD, calm, comfortable Respiratory: clear to auscultation bilaterally, no wheezing, no crackles. Normal respiratory effort. No accessory muscle use.  Cardiovascular: Regular rate and rhythm, no murmurs / rubs / gallops. No extremity edema. 2+ pedal pulses. No carotid bruits.  Abdomen: no tenderness, no masses palpated. No hepatosplenomegaly. Bowel sounds positive.  Neurologic: CN 2-12 grossly intact. Sensation intact, DTR normal. Strength 5/5 in all 4.  Psychiatric: Normal judgment and insight. Alert and oriented x 3. Normal mood.   Data Reviewed:    Labs on Admission: I have personally reviewed following labs and imaging studies  CBC: Recent Labs  Lab 10/07/23 1541  WBC 12.8*  HGB 11.0*  HCT 36.4  MCV 90.8  PLT 328   Basic Metabolic Panel: Recent Labs  Lab 10/07/23 1541  NA 141  K 4.5  CL 107  CO2 21*  GLUCOSE 139*  BUN 37*  CREATININE 1.88*  CALCIUM 8.7*   GFR: Estimated Creatinine Clearance: 35.7 mL/min (A) (by C-G formula based on SCr of 1.88 mg/dL (H)). Liver Function Tests: No results for input(s): "AST", "ALT", "ALKPHOS", "BILITOT", "PROT", "ALBUMIN" in the last 168 hours. No results for input(s): "LIPASE", "AMYLASE" in the last 168 hours. No results for input(s): "AMMONIA" in the last 168 hours. Coagulation Profile: No results for input(s): "INR", "PROTIME" in the last 168 hours. Cardiac Enzymes: No results  for input(s): "CKTOTAL", "CKMB", "CKMBINDEX", "TROPONINI" in the last 168 hours. BNP (last 3 results) No results for input(s): "PROBNP" in the last 8760 hours. HbA1C: No results for input(s): "HGBA1C" in the last 72 hours. CBG: Recent Labs  Lab 10/07/23 1833  GLUCAP 166*   Lipid Profile: No results for input(s): "CHOL", "HDL", "LDLCALC", "TRIG", "CHOLHDL", "LDLDIRECT" in the last 72 hours. Thyroid Function Tests: No results for input(s): "TSH", "T4TOTAL", "FREET4", "T3FREE", "THYROIDAB" in the last 72 hours. Anemia Panel: No results for input(s): "VITAMINB12", "FOLATE", "FERRITIN", "TIBC", "IRON", "RETICCTPCT" in the last 72 hours. Urine analysis:    Component Value Date/Time   COLORURINE STRAW (A) 08/01/2021 1041   APPEARANCEUR CLEAR 08/01/2021 1041   LABSPEC 1.008 08/01/2021 1041   PHURINE 6.0 08/01/2021 1041   GLUCOSEU NEGATIVE 08/01/2021 1041   HGBUR NEGATIVE 08/01/2021 1041   BILIRUBINUR NEGATIVE 08/01/2021 1041   KETONESUR NEGATIVE 08/01/2021 1041   PROTEINUR NEGATIVE 08/01/2021 1041   UROBILINOGEN 0.2 08/24/2009 0903   NITRITE NEGATIVE 08/01/2021 1041   LEUKOCYTESUR NEGATIVE 08/01/2021 1041    Radiological Exams on Admission: DG Chest Portable 1 View  Result Date: 10/07/2023 CLINICAL DATA:  Near-syncope EXAM: PORTABLE CHEST 1 VIEW COMPARISON:  Chest radiograph dated December 17, 2022. FINDINGS: The heart size and mediastinal contours are unchanged. Similar chronic elevation of the left hemidiaphragm with associated basilar atelectasis/scarring. No focal consolidation, pneumothorax, or sizable pleural effusion. No acute osseous abnormality. IMPRESSION: No acute cardiopulmonary findings. Similar chronic elevation of the left hemidiaphragm with left basilar atelectasis/scarring. Electronically Signed   By: Hart Robinsons M.D.   On: 10/07/2023 17:17   CT Head Wo Contrast  Result Date: 10/07/2023 CLINICAL DATA:  Head trauma, syncope EXAM: CT HEAD WITHOUT CONTRAST CT  CERVICAL SPINE WITHOUT CONTRAST TECHNIQUE: Multidetector CT imaging of the head and cervical spine was performed following the standard protocol without intravenous contrast. Multiplanar CT image reconstructions of the cervical spine were also generated. RADIATION DOSE REDUCTION: This exam was performed according to the departmental dose-optimization program which includes automated exposure control, adjustment of the mA and/or kV according to patient size and/or use of iterative reconstruction technique. COMPARISON:  03/27/2022 FINDINGS: CT HEAD FINDINGS Brain: No evidence of acute infarction, hemorrhage, hydrocephalus, extra-axial collection or mass lesion/mass effect.  Vascular: No hyperdense vessel or unexpected calcification. Skull: Normal. Negative for fracture or focal lesion. Sinuses/Orbits: No acute finding. Other: None. CT CERVICAL SPINE FINDINGS Alignment: Normal. Skull base and vertebrae: No acute fracture. No primary bone lesion or focal pathologic process. Soft tissues and spinal canal: No prevertebral fluid or swelling. No visible canal hematoma. Disc levels: Mild disc space height loss and osteophytosis of C5-C7 with otherwise intact disc spaces. Upper chest: Negative. Other: None. IMPRESSION: 1. No acute intracranial pathology. 2. No fracture or static subluxation of the cervical spine. 3. Mild disc space height loss and osteophytosis of C5-C7 with otherwise intact disc spaces. Electronically Signed   By: Jearld Lesch M.D.   On: 10/07/2023 16:40   CT Cervical Spine Wo Contrast  Result Date: 10/07/2023 CLINICAL DATA:  Head trauma, syncope EXAM: CT HEAD WITHOUT CONTRAST CT CERVICAL SPINE WITHOUT CONTRAST TECHNIQUE: Multidetector CT imaging of the head and cervical spine was performed following the standard protocol without intravenous contrast. Multiplanar CT image reconstructions of the cervical spine were also generated. RADIATION DOSE REDUCTION: This exam was performed according to the  departmental dose-optimization program which includes automated exposure control, adjustment of the mA and/or kV according to patient size and/or use of iterative reconstruction technique. COMPARISON:  03/27/2022 FINDINGS: CT HEAD FINDINGS Brain: No evidence of acute infarction, hemorrhage, hydrocephalus, extra-axial collection or mass lesion/mass effect. Vascular: No hyperdense vessel or unexpected calcification. Skull: Normal. Negative for fracture or focal lesion. Sinuses/Orbits: No acute finding. Other: None. CT CERVICAL SPINE FINDINGS Alignment: Normal. Skull base and vertebrae: No acute fracture. No primary bone lesion or focal pathologic process. Soft tissues and spinal canal: No prevertebral fluid or swelling. No visible canal hematoma. Disc levels: Mild disc space height loss and osteophytosis of C5-C7 with otherwise intact disc spaces. Upper chest: Negative. Other: None. IMPRESSION: 1. No acute intracranial pathology. 2. No fracture or static subluxation of the cervical spine. 3. Mild disc space height loss and osteophytosis of C5-C7 with otherwise intact disc spaces. Electronically Signed   By: Jearld Lesch M.D.   On: 10/07/2023 16:40    EKG: Independently reviewed.   Assessment and Plan: * Syncope Possibly just due to dehydration with AKI in setting of recent illness (recurrent sinusitis / URI) Syncope pathway Tele monitor 2d echo  AKI (acute kidney injury) (HCC) Suspect most likely pre-renal due to exertion + recent illness (sinus infection). IVF Strict intake and output Hold hydrochlorothiazide and ARB Repeat BMP in AM  Recurrent sinusitis Sinusitis / URI symptoms for past 2 weeks. Apparently h/o frequent recurrent sinusitis with multiple PCP every year for this. Will continue doxycycline started for this x2 days ago Continue tessalon PRN CXR today is neg for PNA findings. Is a bid odd though that CT head today was negative for any sinus findings... Looks like she saw Dr.  Jenne Pane a few years back for recurrent sinus issues.  He notes long history of eustachian tube dysfunction. Probably should be seeing Dr. Jenne Pane still as sinus issues continue to be ongoing. Anyone worked her up for an immunodeficiency?  Type 2 diabetes mellitus with diabetic neuropathy, with long-term current use of insulin (HCC) Hold metformin due to AKI Sensitive SSI AC Half home long acting insulin once med rec completed  Hypertension associated with diabetes (HCC) Med rec pending Hold hydrochlorothiazide and ARB due to AKI      Advance Care Planning:   Code Status: Full Code  Consults: None  Family Communication: Family at bedside  Severity of Illness:  The appropriate patient status for this patient is OBSERVATION. Observation status is judged to be reasonable and necessary in order to provide the required intensity of service to ensure the patient's safety. The patient's presenting symptoms, physical exam findings, and initial radiographic and laboratory data in the context of their medical condition is felt to place them at decreased risk for further clinical deterioration. Furthermore, it is anticipated that the patient will be medically stable for discharge from the hospital within 2 midnights of admission.   Author: Hillary Bow., DO 10/07/2023 7:54 PM  For on call review [email or web address].

## 2023-10-07 NOTE — Assessment & Plan Note (Addendum)
Hold metformin due to AKI Sensitive SSI AC Half home long acting insulin once med rec completed

## 2023-10-07 NOTE — Assessment & Plan Note (Signed)
Med rec pending Hold hydrochlorothiazide and ARB due to AKI

## 2023-10-07 NOTE — Assessment & Plan Note (Signed)
Possibly just due to dehydration with AKI in setting of recent illness (recurrent sinusitis / URI) Syncope pathway Tele monitor 2d echo

## 2023-10-08 ENCOUNTER — Encounter (HOSPITAL_COMMUNITY): Payer: Self-pay | Admitting: Internal Medicine

## 2023-10-08 ENCOUNTER — Other Ambulatory Visit: Payer: Self-pay

## 2023-10-08 ENCOUNTER — Observation Stay (HOSPITAL_COMMUNITY): Payer: Medicare HMO

## 2023-10-08 DIAGNOSIS — I152 Hypertension secondary to endocrine disorders: Secondary | ICD-10-CM | POA: Diagnosis not present

## 2023-10-08 DIAGNOSIS — Z794 Long term (current) use of insulin: Secondary | ICD-10-CM | POA: Diagnosis not present

## 2023-10-08 DIAGNOSIS — N179 Acute kidney failure, unspecified: Secondary | ICD-10-CM | POA: Diagnosis not present

## 2023-10-08 DIAGNOSIS — R55 Syncope and collapse: Secondary | ICD-10-CM | POA: Diagnosis not present

## 2023-10-08 DIAGNOSIS — E1159 Type 2 diabetes mellitus with other circulatory complications: Secondary | ICD-10-CM | POA: Diagnosis not present

## 2023-10-08 DIAGNOSIS — E1140 Type 2 diabetes mellitus with diabetic neuropathy, unspecified: Secondary | ICD-10-CM | POA: Diagnosis not present

## 2023-10-08 LAB — URINALYSIS, ROUTINE W REFLEX MICROSCOPIC
Bilirubin Urine: NEGATIVE
Glucose, UA: 50 mg/dL — AB
Hgb urine dipstick: NEGATIVE
Ketones, ur: NEGATIVE mg/dL
Nitrite: NEGATIVE
Protein, ur: 30 mg/dL — AB
Specific Gravity, Urine: 1.021 (ref 1.005–1.030)
Squamous Epithelial / HPF: >50 /[HPF] (ref 0–5)
WBC, UA: >50 WBC/hpf (ref 0–5)
pH: 5 (ref 5.0–8.0)

## 2023-10-08 LAB — ECHOCARDIOGRAM COMPLETE
AR max vel: 2.53 cm2
AV Area VTI: 2.48 cm2
AV Area mean vel: 2.47 cm2
AV Mean grad: 6 mm[Hg]
AV Peak grad: 10.1 mm[Hg]
Ao pk vel: 1.59 m/s
Area-P 1/2: 3.85 cm2
Calc EF: 47.3 %
Height: 70 in
S' Lateral: 3.4 cm
Single Plane A2C EF: 40.2 %
Single Plane A4C EF: 54 %
Weight: 3400.38 [oz_av]

## 2023-10-08 LAB — CBC
HCT: 30.8 % — ABNORMAL LOW (ref 36.0–46.0)
Hemoglobin: 9.5 g/dL — ABNORMAL LOW (ref 12.0–15.0)
MCH: 27.5 pg (ref 26.0–34.0)
MCHC: 30.8 g/dL (ref 30.0–36.0)
MCV: 89 fL (ref 80.0–100.0)
Platelets: 280 10*3/uL (ref 150–400)
RBC: 3.46 MIL/uL — ABNORMAL LOW (ref 3.87–5.11)
RDW: 13.7 % (ref 11.5–15.5)
WBC: 10.9 10*3/uL — ABNORMAL HIGH (ref 4.0–10.5)
nRBC: 0 % (ref 0.0–0.2)

## 2023-10-08 LAB — BASIC METABOLIC PANEL
Anion gap: 5 (ref 5–15)
BUN: 35 mg/dL — ABNORMAL HIGH (ref 8–23)
CO2: 26 mmol/L (ref 22–32)
Calcium: 8.2 mg/dL — ABNORMAL LOW (ref 8.9–10.3)
Chloride: 106 mmol/L (ref 98–111)
Creatinine, Ser: 1.44 mg/dL — ABNORMAL HIGH (ref 0.44–1.00)
GFR, Estimated: 39 mL/min — ABNORMAL LOW (ref >60)
Glucose, Bld: 127 mg/dL — ABNORMAL HIGH (ref 70–99)
Potassium: 4.6 mmol/L (ref 3.5–5.1)
Sodium: 137 mmol/L (ref 135–145)

## 2023-10-08 LAB — TROPONIN I (HIGH SENSITIVITY): Troponin I (High Sensitivity): 6 ng/L (ref <18)

## 2023-10-08 LAB — GLUCOSE, CAPILLARY
Glucose-Capillary: 151 mg/dL — ABNORMAL HIGH (ref 70–99)
Glucose-Capillary: 85 mg/dL (ref 70–99)

## 2023-10-08 LAB — CBG MONITORING, ED: Glucose-Capillary: 117 mg/dL — ABNORMAL HIGH (ref 70–99)

## 2023-10-08 LAB — HIV ANTIBODY (ROUTINE TESTING W REFLEX): HIV Screen 4th Generation wRfx: NONREACTIVE

## 2023-10-08 MED ORDER — GABAPENTIN 300 MG PO CAPS: 300.0000 mg | ORAL_CAPSULE | Freq: Two times a day (BID) | ORAL | Status: DC

## 2023-10-08 MED ORDER — FOSFOMYCIN TROMETHAMINE 3 G PO PACK
3.0000 g | PACK | Freq: Once | ORAL | Status: AC
  Administered 2023-10-08: 3 g via ORAL
  Filled 2023-10-08: qty 3

## 2023-10-08 MED ORDER — PERFLUTREN LIPID MICROSPHERE
1.0000 mL | INTRAVENOUS | Status: AC | PRN
  Administered 2023-10-08: 2 mL via INTRAVENOUS

## 2023-10-08 MED ORDER — GABAPENTIN 300 MG PO CAPS
300.0000 mg | ORAL_CAPSULE | Freq: Every morning | ORAL | Status: DC
  Administered 2023-10-08: 300 mg via ORAL
  Filled 2023-10-08: qty 1

## 2023-10-08 MED ORDER — ENOXAPARIN SODIUM 60 MG/0.6ML IJ SOSY: 50.0000 mg | PREFILLED_SYRINGE | INTRAMUSCULAR | Status: DC

## 2023-10-08 MED ORDER — VERAPAMIL HCL ER 240 MG PO TBCR
240.0000 mg | EXTENDED_RELEASE_TABLET | Freq: Every morning | ORAL | Status: DC
  Administered 2023-10-08: 240 mg via ORAL
  Filled 2023-10-08: qty 1

## 2023-10-08 MED ORDER — PRAVASTATIN SODIUM 40 MG PO TABS: 20.0000 mg | ORAL_TABLET | Freq: Every day | ORAL | Status: DC

## 2023-10-08 MED ORDER — GABAPENTIN 300 MG PO CAPS: 600.0000 mg | ORAL_CAPSULE | Freq: Every day | ORAL | Status: DC

## 2023-10-08 MED ORDER — FLUOXETINE HCL 20 MG PO CAPS
40.0000 mg | ORAL_CAPSULE | Freq: Every day | ORAL | Status: DC
  Administered 2023-10-08: 40 mg via ORAL
  Filled 2023-10-08: qty 2

## 2023-10-08 NOTE — ED Notes (Signed)
ED TO INPATIENT HANDOFF REPORT  ED Nurse Name and Phone #: Forde Dandy and Marchelle Folks  [phone]  S Name/Age/Gender Samantha [name] 67 y.o. female Room/Bed: 018C/018C  Code Status   Code Status: Full Code  Home/SNF/Other Home Patient oriented to: self, place, time, and situation Is this baseline? Yes   Triage Complete: Triage complete  Chief Complaint Syncope [R55]  Triage Note Pt was cleaning out the house of a recently deceased family member, sitting on the hood of a car, she passed out and was caught by family, was passed out for around 1 to 2 minutes. Pt is otherwise stable but did have orthostatic vital signs.   NS   Medic vitals   64hr 100% 3l  183 bgl 18rr  20g left ac   Allergies Allergies  Allergen Reactions   Iohexol Anaphylaxis     Code: SOB, Desc: respiratory distress-ASM, Onset Date: [id number]    Iodinated Contrast Media Other (See Comments)   Jardiance [Empagliflozin] Rash    Level of Care/Admitting Diagnosis ED Disposition     ED Disposition  Admit   Condition  --   Comment  Hospital Area: [facility] [[id number]]  Level of Care: Telemetry Medical [104]  May place patient in observation at [facility] or [facility] if equivalent level of care is available:: No  Covid Evaluation: Asymptomatic - no recent exposure (last 10 days) testing not required  Diagnosis: Syncope [206001]  Admitting Physician: Hillary Bow [[id number]]  Attending Physician: Hillary Bow [[id number]]          B Medical/Surgery History Past Medical History:  Diagnosis Date   Arthritis    Dyslipidemia    Heart disease    Heart murmur 2011   Left bundle branch block 2011   Type II or unspecified type diabetes mellitus without mention of complication, not stated as uncontrolled    Unspecified essential hypertension    Past Surgical History:  Procedure Laterality Date   CATARACT EXTRACTION W/PHACO Left 07/11/2022   Procedure: CATARACT EXTRACTION  PHACO AND INTRAOCULAR LENS PLACEMENT (IOC) LEFT  DIABETIC;  Surgeon: Galen Manila, MD;  Location: [facility];  Service: Ophthalmology;  Laterality: Left;  3.60 0:22.7   CATARACT EXTRACTION W/PHACO Right 07/25/2022   Procedure: CATARACT EXTRACTION PHACO AND INTRAOCULAR LENS PLACEMENT (IOC) RIGHT DIABETIC 5.12 00:33.0;  Surgeon: Galen Manila, MD;  Location: [facility];  Service: Ophthalmology;  Laterality: Right;   KNEE ARTHROSCOPY Right 2017   LEFT HEART CATH AND CORONARY ANGIOGRAPHY N/A 06/30/2021   Procedure: LEFT HEART CATH AND CORONARY ANGIOGRAPHY;  Surgeon: Lennette Bihari, MD;  Location: [facility] INVASIVE CV [facility];  Service: Cardiovascular;  Laterality: N/A;   TONSILLECTOMY  1962     A IV Location/Drains/Wounds Patient Lines/Drains/Airways Status     Active Line/Drains/Airways     Name Placement date Placement time Site Days   Peripheral IV 10/07/23 20 G Anterior;Left Forearm 10/07/23  1608  Forearm  1            Intake/Output Last 24 hours No intake or output data in the 24 hours ending 10/08/23 0433  Labs/Imaging Results for orders placed or performed during the hospital encounter of 10/07/23 (from the past 48 hour(s))  Basic metabolic panel     Status: Abnormal   Collection Time: 10/07/23  3:41 PM  Result Value Ref Range   Sodium 141 135 - 145 mmol/L   Potassium 4.5 3.5 - 5.1 mmol/L   Chloride 107 98 - 111  mmol/L   CO2 21 (L) 22 - 32 mmol/L   Glucose, Bld 139 (H) 70 - 99 mg/dL    Comment: Glucose reference range applies only to samples taken after fasting for at least 8 hours.   BUN 37 (H) 8 - 23 mg/dL   Creatinine, Ser 1.02 (H) 0.44 - 1.00 mg/dL   Calcium 8.7 (L) 8.9 - 10.3 mg/dL   GFR, Estimated 29 (L) >60 mL/min    Comment: (NOTE) Calculated using the CKD-EPI Creatinine Equation (2021)    Anion gap 13 5 - 15    Comment: Performed at [facility], [address], [place], [place] [address]  CBC     Status: Abnormal   Collection Time:  10/07/23  3:41 PM  Result Value Ref Range   WBC 12.8 (H) 4.0 - 10.5 K/uL   RBC 4.01 3.87 - 5.11 MIL/uL   Hemoglobin 11.0 (L) 12.0 - 15.0 g/dL   HCT 64.4 03.4 - 74.2 %   MCV 90.8 80.0 - 100.0 fL   MCH 27.4 26.0 - 34.0 pg   MCHC 30.2 30.0 - 36.0 g/dL   RDW 59.5 63.8 - 75.6 %   Platelets 328 150 - 400 K/uL   nRBC 0.0 0.0 - 0.2 %    Comment: Performed at [facility], [address], [place], [place] [address]  Troponin I (High Sensitivity)     Status: None   Collection Time: 10/07/23  3:41 PM  Result Value Ref Range   Troponin I (High Sensitivity) 5 <18 ng/L    Comment: (NOTE) Elevated high sensitivity troponin I (hsTnI) values and significant  changes across serial measurements may suggest ACS but many other  chronic and acute conditions are known to elevate hsTnI results.  Refer to the "Links" section for chest pain algorithms and additional  guidance. Performed at [facility], [address], [place], [place] [address]   CBG monitoring, ED     Status: Abnormal   Collection Time: 10/07/23  6:33 PM  Result Value Ref Range   Glucose-Capillary 166 (H) 70 - 99 mg/dL    Comment: Glucose reference range applies only to samples taken after fasting for at least 8 hours.   Comment 1 Notify RN    Comment 2 Document in Chart   Urinalysis, Routine w reflex microscopic -Urine, Clean Catch     Status: Abnormal   Collection Time: 10/08/23 12:06 AM  Result Value Ref Range   Color, Urine YELLOW YELLOW   APPearance CLOUDY (A) CLEAR   Specific Gravity, Urine 1.021 1.005 - 1.030   pH 5.0 5.0 - 8.0   Glucose, UA 50 (A) NEGATIVE mg/dL   Hgb urine dipstick NEGATIVE NEGATIVE   Bilirubin Urine NEGATIVE NEGATIVE   Ketones, ur NEGATIVE NEGATIVE mg/dL   Protein, ur 30 (A) NEGATIVE mg/dL   Nitrite NEGATIVE NEGATIVE   Leukocytes,Ua LARGE (A) NEGATIVE   RBC / HPF 11-20 0 - 5 RBC/hpf   WBC, UA >50 0 - 5 WBC/hpf   Bacteria, UA MANY (A) NONE SEEN   Squamous Epithelial / HPF >50 0 - 5 /HPF    Mucus PRESENT    Hyaline Casts, UA PRESENT    Non Squamous Epithelial 0-5 (A) NONE SEEN    Comment: Performed at [facility], [address] [place], [place] [address]  HIV Antibody (routine testing w rflx)     Status: None   Collection Time: 10/08/23 12:06 AM  Result Value Ref  Range   HIV Screen 4th Generation wRfx Non Reactive Non Reactive    Comment: Performed at [facility], [address], [place], [place] [address]   DG Chest Portable 1 View  Result Date: 10/07/2023 CLINICAL DATA:  Near-syncope EXAM: PORTABLE CHEST 1 VIEW COMPARISON:  Chest radiograph dated December 17, 2022. FINDINGS: The heart size and mediastinal contours are unchanged. Similar chronic elevation of the left hemidiaphragm with associated basilar atelectasis/scarring. No focal consolidation, pneumothorax, or sizable pleural effusion. No acute osseous abnormality. IMPRESSION: No acute cardiopulmonary findings. Similar chronic elevation of the left hemidiaphragm with left basilar atelectasis/scarring. Electronically Signed   By: Hart Robinsons M.D.   On: 10/07/2023 17:17   CT Head Wo Contrast  Result Date: 10/07/2023 CLINICAL DATA:  Head trauma, syncope EXAM: CT HEAD WITHOUT CONTRAST CT CERVICAL SPINE WITHOUT CONTRAST TECHNIQUE: Multidetector CT imaging of the head and cervical spine was performed following the standard protocol without intravenous contrast. Multiplanar CT image reconstructions of the cervical spine were also generated. RADIATION DOSE REDUCTION: This exam was performed according to the departmental dose-optimization program which includes automated exposure control, adjustment of the mA and/or kV according to patient size and/or use of iterative reconstruction technique. COMPARISON:  03/27/2022 FINDINGS: CT HEAD FINDINGS Brain: No evidence of acute infarction, hemorrhage, hydrocephalus, extra-axial collection or mass lesion/mass effect. Vascular: No hyperdense vessel or unexpected  calcification. Skull: Normal. Negative for fracture or focal lesion. Sinuses/Orbits: No acute finding. Other: None. CT CERVICAL SPINE FINDINGS Alignment: Normal. Skull base and vertebrae: No acute fracture. No primary bone lesion or focal pathologic process. Soft tissues and spinal canal: No prevertebral fluid or swelling. No visible canal hematoma. Disc levels: Mild disc space height loss and osteophytosis of C5-C7 with otherwise intact disc spaces. Upper chest: Negative. Other: None. IMPRESSION: 1. No acute intracranial pathology. 2. No fracture or static subluxation of the cervical spine. 3. Mild disc space height loss and osteophytosis of C5-C7 with otherwise intact disc spaces. Electronically Signed   By: Jearld Lesch M.D.   On: 10/07/2023 16:40   CT Cervical Spine Wo Contrast  Result Date: 10/07/2023 CLINICAL DATA:  Head trauma, syncope EXAM: CT HEAD WITHOUT CONTRAST CT CERVICAL SPINE WITHOUT CONTRAST TECHNIQUE: Multidetector CT imaging of the head and cervical spine was performed following the standard protocol without intravenous contrast. Multiplanar CT image reconstructions of the cervical spine were also generated. RADIATION DOSE REDUCTION: This exam was performed according to the departmental dose-optimization program which includes automated exposure control, adjustment of the mA and/or kV according to patient size and/or use of iterative reconstruction technique. COMPARISON:  03/27/2022 FINDINGS: CT HEAD FINDINGS Brain: No evidence of acute infarction, hemorrhage, hydrocephalus, extra-axial collection or mass lesion/mass effect. Vascular: No hyperdense vessel or unexpected calcification. Skull: Normal. Negative for fracture or focal lesion. Sinuses/Orbits: No acute finding. Other: None. CT CERVICAL SPINE FINDINGS Alignment: Normal. Skull base and vertebrae: No acute fracture. No primary bone lesion or focal pathologic process. Soft tissues and spinal canal: No prevertebral fluid or swelling. No  visible canal hematoma. Disc levels: Mild disc space height loss and osteophytosis of C5-C7 with otherwise intact disc spaces. Upper chest: Negative. Other: None. IMPRESSION: 1. No acute intracranial pathology. 2. No fracture or static subluxation of the cervical spine. 3. Mild disc space height loss and osteophytosis of C5-C7 with otherwise intact disc spaces. Electronically Signed   By: Jearld Lesch M.D.   On: 10/07/2023 16:40    Pending Labs Unresulted Labs (From admission, onward)  Start     Ordered   10/08/23 0500  CBC  Tomorrow morning,   R        10/07/23 1939   10/08/23 0500  Basic metabolic panel  Tomorrow morning,   R        10/07/23 1939            Vitals/Pain Today's Vitals   10/08/23 0141 10/08/23 0200 10/08/23 0400 10/08/23 0431  BP: 129/68 (!) 140/58 126/63   Pulse: 60 (!) 54 (!) 55   Resp: 16 18 16    Temp: 97.8 F (36.6 C)   98 F (36.7 C)  TempSrc: Oral     SpO2: 98% 96% 96%   PainSc:        Isolation Precautions No active isolations  Medications Medications  insulin aspart (novoLOG) injection 0-9 Units (has no administration in time range)  sodium chloride flush (NS) 0.9 % injection 3 mL (0 mLs Intravenous Hold 10/07/23 2143)  enoxaparin (LOVENOX) injection 40 mg (has no administration in time range)  lactated ringers infusion ( Intravenous New Bag/Given 10/07/23 2147)  acetaminophen (TYLENOL) tablet 650 mg (650 mg Oral Given 10/07/23 2343)    Or  acetaminophen (TYLENOL) suppository 650 mg ( Rectal See Alternative 10/07/23 2343)  ondansetron (ZOFRAN) tablet 4 mg (has no administration in time range)    Or  ondansetron (ZOFRAN) injection 4 mg (has no administration in time range)  doxycycline (VIBRA-TABS) tablet 100 mg (100 mg Oral Given 10/07/23 2150)  benzonatate (TESSALON) capsule 100 mg (has no administration in time range)  aspirin chewable tablet 324 mg (324 mg Oral Given 10/07/23 1612)  acetaminophen (TYLENOL) tablet 650 mg (650 mg Oral  Given 10/07/23 1742)  lactated ringers bolus 1,000 mL (0 mLs Intravenous Stopped 10/07/23 2124)    Mobility walks     Focused Assessments Cardiac Assessment Handoff:  Cardiac Rhythm: Other (Comment), Normal sinus rhythm Lab Results  Component Value Date   CKTOTAL 171 05/11/2010   CKMB 3.5 05/11/2010   TROPONINI <0.03 07/13/2017   Lab Results  Component Value Date   DDIMER <0.27 12/17/2022   Does the Patient currently have chest pain? No    R Recommendations: See Admitting Provider Note  Report given to:   Additional Notes:

## 2023-10-08 NOTE — Care Management Obs Status (Signed)
MEDICARE OBSERVATION STATUS NOTIFICATION   Patient Details  Name: Samantha [name] MRN: [id number] Date of Birth: Jun 07, 1956   Medicare Observation Status Notification Given:  Yes    Ronny Bacon, RN 10/08/2023, 12:04 PM

## 2023-10-08 NOTE — Discharge Summary (Signed)
Physician Discharge Summary   Patient: Samantha [name] MRN: [id number] DOB: 10/13/1956  Admit date:     10/07/2023  Discharge date: 10/08/23  Discharge Physician: Arnetha Courser   PCP: Melida Quitter, PA   Recommendations at discharge:  Please obtain CBC and BMP on follow-up Follow-up with primary care provider within a week Follow-up with cardiology for ZIO monitor results in 2 weeks  Discharge Diagnoses: Principal Problem:   Syncope Active Problems:   AKI (acute kidney injury) (HCC)   Recurrent sinusitis   Hypertension associated with diabetes (HCC)   Type 2 diabetes mellitus with diabetic neuropathy, with long-term current use of insulin [facility])   Hospital Course: Taken from H&P.  Samantha [name] is a 67 y.o. female with medical history significant of DM2, minimal valvular cardiac dz (normal EF, grade 1 DD, mild MR, mild AI), HTN, frequent recurrent sinus infections and long history of eustachian tube dysfunction.   Pt with URI symptoms for past couple of weeks.  Felt she was having another sinus infection.  Saw UC x2 days ago, started on doxycycline.   Today not feeling well, cleaning out home of recently deceased family member.  Sitting on front of a car outside house talking to family, had sudden onset syncope without warning.  Vitals mostly stable on presentation, labs with leukocytosis at 12.8, BUN 37, creatinine 1.88 with baseline seems to be around 1.1-1.2, troponin negative. EKG with NSR, LBBB, T wave inversion in lateral leads but those seems chronic. CT head and cervical spine with no acute abnormality, mild disc space height loss and osteophyte phytosis of C5-C7.  CXR with no acute cardiopulmonary finding, chronic left hemidiaphragm elevation with left basilar atelectasis.  Patient was admitted under observation for syncope workup, echocardiogram ordered.  11/11: Vital stable with mildly elevated blood pressure, improving leukocytosis with decrease in  hemoglobin but all cell lines decreased, likely some dilutional effect.  Improving renal function with creatinine at 1.44.  Restarting home verapamil. Echocardiogram with low normal EF at 50 to 55%, no regional wall motion abnormalities, grade 1 diastolic dysfunction, mildly dilated left atrium. UA with leukocytosis and many bacteria, gave her 1 dose of fosfomycin.  Patient is already on doxycycline and she will complete the course which is recently prescribed by PCP.  Patient had syncopal episode without any warning.  No seizure-like activity.  No prior episodes.  She does has left bundle branch block which is chronic.  Discussed with cardiology and she will get a ZIO monitor in mail with instructions to apply.  She will follow-up with her cardiologist in 2 weeks for the results to rule out any abnormal arrhythmia.  Patient was also instructed to hold home valsartan for 2 more days due to concern of AKI although they are improving but still not at baseline.  She was given some IV fluid and instructed to keep herself well-hydrated.  Patient will continue the rest of her home medications and need to have a close follow-up with her providers for further management and recommendations.    Consultants: None Procedures performed: None Disposition: Home Diet recommendation:  Discharge Diet Orders (From admission, onward)     Start     Ordered   10/08/23 0000  Diet - low sodium heart healthy        10/08/23 1528           Cardiac and Carb modified diet DISCHARGE MEDICATION: Allergies as of 10/08/2023       Reactions   Iohexol Anaphylaxis  Code: SOB, Desc: respiratory distress-ASM, Onset Date: [id number]   Iodinated Contrast Media Other (See Comments)   Jardiance [empagliflozin] Rash        Medication List     STOP taking these medications    ibuprofen 200 MG tablet Commonly known as: ADVIL       TAKE these medications    aspirin 81 MG tablet Take 81 mg by mouth every  morning.   azelastine 0.1 % nasal spray Commonly known as: ASTELIN Place 1 spray into both nostrils daily as needed for allergies.   benzonatate 100 MG capsule Commonly known as: TESSALON Take 1 capsule (100 mg total) by mouth every 8 (eight) hours as needed for cough. What changed: reasons to take this   CALCIUM + D PO Take 1 tablet by mouth every morning.   cetirizine 10 MG tablet Commonly known as: ZYRTEC Take 10 mg by mouth daily.   diclofenac 75 MG EC tablet Commonly known as: VOLTAREN Take 1 tablet by mouth twice daily as needed   diclofenac Sodium 1 % Gel Commonly known as: Voltaren Apply 2 g topically 4 (four) times daily.   doxycycline 100 MG capsule Commonly known as: VIBRAMYCIN Take 1 capsule (100 mg total) by mouth 2 (two) times daily for 7 days.   Fish Oil 1000 MG Caps Take 1,000 mg by mouth every morning.   FLUoxetine 40 MG capsule Commonly known as: PROZAC Take 1 capsule (40 mg total) by mouth daily.   gabapentin 300 MG capsule Commonly known as: NEURONTIN TAKE 1 CAPSULE BY MOUTH IN THE MORNING AND 2 IN THE EVENING   hydrochlorothiazide 12.5 MG capsule Commonly known as: MICROZIDE Take 1 capsule by mouth once daily   Insulin Degludec FlexTouch 100 UNIT/ML Sopn INJECT 12 UNITS SUBCUTANEOUSLY ONCE DAILY What changed:  how much to take how to take this when to take this   lovastatin 20 MG tablet Commonly known as: MEVACOR Take 1 tablet (20 mg total) by mouth at bedtime. What changed: when to take this   metFORMIN 1000 MG tablet Commonly known as: GLUCOPHAGE TAKE 1 TABLET BY MOUTH TWICE DAILY WITH A MEAL   nitroGLYCERIN 0.4 MG SL tablet Commonly known as: NITROSTAT Place 1 tablet (0.4 mg total) under the tongue every 5 (five) minutes as needed for chest pain.   pantoprazole 40 MG tablet Commonly known as: PROTONIX Take 1 tablet by mouth once daily What changed: when to take this   Semaglutide(0.25 or 0.5MG /DOS) 2 MG/3ML Sopn Inject 0.5  mg into the skin once a week. What changed: additional instructions   valsartan 160 MG tablet Commonly known as: DIOVAN Take 1 tablet (160 mg total) by mouth daily. What changed: when to take this   verapamil 240 MG CR tablet Commonly known as: CALAN-SR Take 1 tablet (240 mg total) by mouth every morning. What changed: when to take this        Follow-up Information     Melida Quitter, PA. Schedule an appointment as soon as possible for a visit in 1 week(s).   Specialty: Family Medicine Contact information: [address] [place] [address] [phone]                Discharge Exam: Ceasar Mons Weights   10/08/23 0630  Weight: 96.4 kg   General.  Well-developed lady, in no acute distress. Pulmonary.  Lungs clear bilaterally, normal respiratory effort. CV.  Regular rate and rhythm, no JVD, rub or murmur. Abdomen.  Soft,  nontender, nondistended, BS positive. CNS.  Alert and oriented .  No focal neurologic deficit. Extremities.  No edema, no cyanosis, pulses intact and symmetrical. Psychiatry.  Judgment and insight appears normal.   Condition at discharge: stable  The results of significant diagnostics from this hospitalization (including imaging, microbiology, ancillary and laboratory) are listed below for reference.   Imaging Studies: ECHOCARDIOGRAM COMPLETE  Result Date: 10/08/2023    ECHOCARDIOGRAM REPORT   Patient Name:   Samantha [name] Date of Exam: 10/08/2023 Medical Rec #:  [id number]            Height:       70.0 in Accession #:    [id number]           Weight:       212.5 lb Date of Birth:  1956/11/16             BSA:          2.142 m Patient Age:    67 years             BP:           149/64 mmHg Patient Gender: F                    HR:           57 bpm. Exam Location:  Inpatient Procedure: 2D Echo, Cardiac Doppler, Color Doppler and Intracardiac            Opacification Agent Indications:    R55 Syncope  History:        Patient has prior  history of Echocardiogram examinations, most                 recent 03/23/2023. Abnormal ECG, Arrythmias:LBBB,                 Signs/Symptoms:Syncope; Risk Factors:Hypertension and Diabetes.  Sonographer:    Sheralyn Boatman RDCS Referring Phys: Hillary Bow  Sonographer Comments: Technically difficult study due to poor echo windows and patient is obese. Image acquisition challenging due to patient body habitus. Poor quality sound wave transmission, poor quality machine. IMPRESSIONS  1. Left ventricular ejection fraction, by estimation, is 50 to 55%. The left ventricle has low normal function. The left ventricle has no regional wall motion abnormalities but there is septal bounce consistent with LBBB. There is mild concentric left ventricular hypertrophy. Left ventricular diastolic parameters are consistent with Grade I diastolic dysfunction (impaired relaxation).  2. Right ventricular systolic function is normal. The right ventricular size is normal. There is normal pulmonary artery systolic pressure. The estimated right ventricular systolic pressure is 31.6 mmHg.  3. Left atrial size was mildly dilated.  4. The mitral valve is normal in structure. Trivial mitral valve regurgitation. No evidence of mitral stenosis.  5. The aortic valve is tricuspid. There is mild calcification of the aortic valve. Aortic valve regurgitation is trivial. No aortic stenosis is present.  6. The inferior vena cava is dilated in size with >50% respiratory variability, suggesting right atrial pressure of 8 mmHg. FINDINGS  Left Ventricle: Left ventricular ejection fraction, by estimation, is 50 to 55%. The left ventricle has low normal function. The left ventricle has no regional wall motion abnormalities. Definity contrast agent was given IV to delineate the left ventricular endocardial borders. The left ventricular internal cavity size was normal in size. There is mild concentric left ventricular hypertrophy. Left ventricular diastolic  parameters are consistent with Grade I diastolic dysfunction (impaired  relaxation). Right Ventricle: The right ventricular size is normal. No increase in right ventricular wall thickness. Right ventricular systolic function is normal. There is normal pulmonary artery systolic pressure. The tricuspid regurgitant velocity is 2.43 m/s, and  with an assumed right atrial pressure of 8 mmHg, the estimated right ventricular systolic pressure is 31.6 mmHg. Left Atrium: Left atrial size was mildly dilated. Right Atrium: Right atrial size was normal in size. Pericardium: There is no evidence of pericardial effusion. Mitral Valve: The mitral valve is normal in structure. Mild mitral annular calcification. Trivial mitral valve regurgitation. No evidence of mitral valve stenosis. Tricuspid Valve: The tricuspid valve is normal in structure. Tricuspid valve regurgitation is trivial. Aortic Valve: The aortic valve is tricuspid. There is mild calcification of the aortic valve. Aortic valve regurgitation is trivial. No aortic stenosis is present. Aortic valve mean gradient measures 6.0 mmHg. Aortic valve peak gradient measures 10.1 mmHg. Aortic valve area, by VTI measures 2.48 cm. Pulmonic Valve: The pulmonic valve was normal in structure. Pulmonic valve regurgitation is not visualized. Aorta: The aortic root is normal in size and structure. Venous: The inferior vena cava is dilated in size with greater than 50% respiratory variability, suggesting right atrial pressure of 8 mmHg. IAS/Shunts: No atrial level shunt detected by color flow Doppler.  LEFT VENTRICLE PLAX 2D LVIDd:         4.80 cm      Diastology LVIDs:         3.40 cm      LV e' medial:    3.92 cm/s LV PW:         1.30 cm      LV E/e' medial:  20.5 LV IVS:        1.00 cm      LV e' lateral:   9.03 cm/s LVOT diam:     2.00 cm      LV E/e' lateral: 8.9 LV SV:         97 LV SV Index:   45 LVOT Area:     3.14 cm  LV Volumes (MOD) LV vol d, MOD A2C: 146.0 ml LV vol d, MOD  A4C: 116.5 ml LV vol s, MOD A2C: 87.3 ml LV vol s, MOD A4C: 53.6 ml LV SV MOD A2C:     58.7 ml LV SV MOD A4C:     116.5 ml LV SV MOD BP:      63.4 ml RIGHT VENTRICLE             IVC RV S prime:     10.90 cm/s  IVC diam: 2.30 cm TAPSE (M-mode): 2.4 cm LEFT ATRIUM             Index        RIGHT ATRIUM          Index LA diam:        4.20 cm 1.96 cm/m   RA Area:     9.99 cm LA Vol (A2C):   48.5 ml 22.64 ml/m  RA Volume:   21.90 ml 10.22 ml/m LA Vol (A4C):   72.2 ml 33.71 ml/m LA Biplane Vol: 60.5 ml 28.25 ml/m  AORTIC VALVE AV Area (Vmax):    2.53 cm AV Area (Vmean):   2.47 cm AV Area (VTI):     2.48 cm AV Vmax:           159.00 cm/s AV Vmean:          111.000 cm/s AV VTI:  0.391 m AV Peak Grad:      10.1 mmHg AV Mean Grad:      6.0 mmHg LVOT Vmax:         128.00 cm/s LVOT Vmean:        87.200 cm/s LVOT VTI:          0.308 m LVOT/AV VTI ratio: 0.79  AORTA Ao Root diam: 3.00 cm Ao Asc diam:  3.40 cm MITRAL VALVE               TRICUSPID VALVE MV Area (PHT): 3.85 cm    TR Peak grad:   23.6 mmHg MV Decel Time: 197 msec    TR Vmax:        243.00 cm/s MV E velocity: 80.40 cm/s MV A velocity: 89.20 cm/s  SHUNTS MV E/A ratio:  0.90        Systemic VTI:  0.31 m                            Systemic Diam: 2.00 cm Dalton McleanMD Electronically signed by Wilfred Lacy Signature Date/Time: 10/08/2023/12:24:06 PM    Final    DG Chest Portable 1 View  Result Date: 10/07/2023 CLINICAL DATA:  Near-syncope EXAM: PORTABLE CHEST 1 VIEW COMPARISON:  Chest radiograph dated December 17, 2022. FINDINGS: The heart size and mediastinal contours are unchanged. Similar chronic elevation of the left hemidiaphragm with associated basilar atelectasis/scarring. No focal consolidation, pneumothorax, or sizable pleural effusion. No acute osseous abnormality. IMPRESSION: No acute cardiopulmonary findings. Similar chronic elevation of the left hemidiaphragm with left basilar atelectasis/scarring. Electronically Signed   By:  Hart Robinsons M.D.   On: 10/07/2023 17:17   CT Head Wo Contrast  Result Date: 10/07/2023 CLINICAL DATA:  Head trauma, syncope EXAM: CT HEAD WITHOUT CONTRAST CT CERVICAL SPINE WITHOUT CONTRAST TECHNIQUE: Multidetector CT imaging of the head and cervical spine was performed following the standard protocol without intravenous contrast. Multiplanar CT image reconstructions of the cervical spine were also generated. RADIATION DOSE REDUCTION: This exam was performed according to the departmental dose-optimization program which includes automated exposure control, adjustment of the mA and/or kV according to patient size and/or use of iterative reconstruction technique. COMPARISON:  03/27/2022 FINDINGS: CT HEAD FINDINGS Brain: No evidence of acute infarction, hemorrhage, hydrocephalus, extra-axial collection or mass lesion/mass effect. Vascular: No hyperdense vessel or unexpected calcification. Skull: Normal. Negative for fracture or focal lesion. Sinuses/Orbits: No acute finding. Other: None. CT CERVICAL SPINE FINDINGS Alignment: Normal. Skull base and vertebrae: No acute fracture. No primary bone lesion or focal pathologic process. Soft tissues and spinal canal: No prevertebral fluid or swelling. No visible canal hematoma. Disc levels: Mild disc space height loss and osteophytosis of C5-C7 with otherwise intact disc spaces. Upper chest: Negative. Other: None. IMPRESSION: 1. No acute intracranial pathology. 2. No fracture or static subluxation of the cervical spine. 3. Mild disc space height loss and osteophytosis of C5-C7 with otherwise intact disc spaces. Electronically Signed   By: Jearld Lesch M.D.   On: 10/07/2023 16:40   CT Cervical Spine Wo Contrast  Result Date: 10/07/2023 CLINICAL DATA:  Head trauma, syncope EXAM: CT HEAD WITHOUT CONTRAST CT CERVICAL SPINE WITHOUT CONTRAST TECHNIQUE: Multidetector CT imaging of the head and cervical spine was performed following the standard protocol without  intravenous contrast. Multiplanar CT image reconstructions of the cervical spine were also generated. RADIATION DOSE REDUCTION: This exam was performed according to the departmental dose-optimization program which includes  automated exposure control, adjustment of the mA and/or kV according to patient size and/or use of iterative reconstruction technique. COMPARISON:  03/27/2022 FINDINGS: CT HEAD FINDINGS Brain: No evidence of acute infarction, hemorrhage, hydrocephalus, extra-axial collection or mass lesion/mass effect. Vascular: No hyperdense vessel or unexpected calcification. Skull: Normal. Negative for fracture or focal lesion. Sinuses/Orbits: No acute finding. Other: None. CT CERVICAL SPINE FINDINGS Alignment: Normal. Skull base and vertebrae: No acute fracture. No primary bone lesion or focal pathologic process. Soft tissues and spinal canal: No prevertebral fluid or swelling. No visible canal hematoma. Disc levels: Mild disc space height loss and osteophytosis of C5-C7 with otherwise intact disc spaces. Upper chest: Negative. Other: None. IMPRESSION: 1. No acute intracranial pathology. 2. No fracture or static subluxation of the cervical spine. 3. Mild disc space height loss and osteophytosis of C5-C7 with otherwise intact disc spaces. Electronically Signed   By: Jearld Lesch M.D.   On: 10/07/2023 16:40    Microbiology: Results for orders placed or performed during the hospital encounter of 11/12/20  Culture, group A strep     Status: None   Collection Time: 11/12/20  7:25 PM   Specimen: Throat  Result Value Ref Range Status   Specimen Description THROAT  Final   Special Requests   Final    NONE Performed at [facility], [address] [place], [place] [address]    Culture FEW STREPTOCOCCUS,BETA HEMOLYTIC NOT GROUP A  Final   Report Status 11/17/2020 FINAL  Final    Labs: CBC: Recent Labs  Lab 10/07/23 1541 10/08/23 0442  WBC 12.8* 10.9*  HGB 11.0* 9.5*  HCT 36.4 30.8*  MCV  90.8 89.0  PLT 328 280   Basic Metabolic Panel: Recent Labs  Lab 10/07/23 1541 10/08/23 0442  NA 141 137  K 4.5 4.6  CL 107 106  CO2 21* 26  GLUCOSE 139* 127*  BUN 37* 35*  CREATININE 1.88* 1.44*  CALCIUM 8.7* 8.2*   Liver Function Tests: No results for input(s): "AST", "ALT", "ALKPHOS", "BILITOT", "PROT", "ALBUMIN" in the last 168 hours. CBG: Recent Labs  Lab 10/07/23 1833 10/08/23 0434 10/08/23 0640 10/08/23 1237  GLUCAP 166* 117* 151* 85    Discharge time spent: greater than 30 minutes.  This record has been created using Conservation officer, historic buildings. Errors have been sought and corrected,but may not always be located. Such creation errors do not reflect on the standard of care.   Signed: Arnetha Courser, MD [facility] 10/08/2023

## 2023-10-08 NOTE — Evaluation (Signed)
Physical Therapy Evaluation Patient Details Name: Samantha [name] MRN: [id number] DOB: [birth year] Today's Date: 10/08/2023  History of Present Illness  Pt is 67 yo presenting to [facility] following episode of syncope without warning. PMH: DM II, arthritis, dyslipidemia, heart disease, heart murmur, LBBB, HTN.  Clinical Impression  Pt is presenting slightly below baseline level of functioning. Pt reports no dizziness/lightheadedness during gait. Pt utlized IV pole during gait and discussed using quad cane at home which pt states she has available to alleviate pain. Pt was encouraged to see her orthopedic MD due to recent fall on the R knee with significant increase in pain. Pt lives with her spouse and is ind at baseline. Due to pt current functional status, home set up and available assistance at home no recommended skilled physical therapy services recommended on discharge at this time. Will continue to see in acute care hospital setting in order to ensure pt returns home safely with decreased risk for falls, injury and re-hospitalization. Pt tolerated treatment session well.         If plan is discharge home, recommend the following: Help with stairs or ramp for entrance           Functional Status Assessment Patient has had a recent decline in their functional status and demonstrates the ability to make significant improvements in function in a reasonable and predictable amount of time.     Precautions / Restrictions Precautions Precautions: Fall Restrictions Weight Bearing Restrictions: No      Mobility  Bed Mobility     General bed mobility comments: Received in recliner and returned to recliner at end of session.    Transfers Overall transfer level: Modified independent Equipment used: None       General transfer comment: Slow movement due to pain in the R knee.    Ambulation/Gait Ambulation/Gait assistance: Supervision Gait Distance (Feet): 200 Feet Assistive device:  IV Pole Gait Pattern/deviations: Antalgic, Decreased stride length, Decreased stance time - right, Decreased step length - left Gait velocity: decreased Gait velocity interpretation: 1.31 - 2.62 ft/sec, indicative of limited community ambulator   General Gait Details: Antalgic gait pattern due to pain in the R knee.  Stairs Stairs:  (discussed how pt navigates stairs at home. She has been using wall since she fell and hurt knee.)             Balance Overall balance assessment: Needs assistance   Sitting balance-Leahy Scale: Normal     Standing balance support: Single extremity supported, During functional activity Standing balance-Leahy Scale: Fair Standing balance comment: slightly off balance with dynamic activities due to recent injury to R knee.           Pertinent Vitals/Pain Pain Assessment Pain Assessment: 0-10 Pain Score: 5  Pain Location: head and knee Pain Descriptors / Indicators: Aching, Stabbing, Sharp Pain Intervention(s): Monitored during session, Limited activity within patient's tolerance    Home Living Family/patient expects to be discharged to:: Private residence Living Arrangements: Spouse/significant other   Type of Home: House Home Access: Stairs to enter Entrance Stairs-Rails: None Entrance Stairs-Number of Steps: 2 and then 1 with no rails pt has a wall to use for stability that she has been using since she fell on her R knee.   Home Layout: One level Home Equipment: Lift chair;Cane - quad      Prior Function Prior Level of Function : Independent/Modified Independent;Driving;History of Falls (last six months)  Mobility Comments: Pt reports that she has had 2 falls this year and one fall in the past 6 months. Pt states that she tripped on the carpet. Her daughter assisted her off the floor. ADLs Comments: Ind with ADL's and IADL's.     Extremity/Trunk Assessment   Upper Extremity Assessment Upper Extremity Assessment:  Overall WFL for tasks assessed    Lower Extremity Assessment Lower Extremity Assessment: Overall WFL for tasks assessed;RLE deficits/detail RLE Deficits / Details: Pain and bruising in the R knee following recent fall.    Cervical / Trunk Assessment Cervical / Trunk Assessment: Normal  Communication   Communication Communication: No apparent difficulties Cueing Techniques: Verbal cues  Cognition Arousal: Alert Behavior During Therapy: WFL for tasks assessed/performed Overall Cognitive Status: Within Functional Limits for tasks assessed      General Comments General comments (skin integrity, edema, etc.): bruising at the anterior R knee from recent fall. Pt states she tripped over carpet.        Assessment/Plan    PT Assessment Patient needs continued PT services  PT Problem List Decreased mobility;Decreased balance;Pain       PT Treatment Interventions DME instruction;Therapeutic exercise;Gait training;Balance training;Stair training;Functional mobility training;Therapeutic activities;Patient/family education    PT Goals (Current goals can be found in the Care Plan section)  Acute Rehab PT Goals Patient Stated Goal: To return home and see orthopedic MD about R Knee PT Goal Formulation: With patient Time For Goal Achievement: 10/22/23 Potential to Achieve Goals: Good    Frequency Min 1X/week        AM-PAC PT "6 Clicks" Mobility  Outcome Measure Help needed turning from your back to your side while in a flat bed without using bedrails?: None Help needed moving from lying on your back to sitting on the side of a flat bed without using bedrails?: None Help needed moving to and from a bed to a chair (including a wheelchair)?: None Help needed standing up from a chair using your arms (e.g., wheelchair or bedside chair)?: None Help needed to walk in hospital room?: A Little Help needed climbing 3-5 steps with a railing? : A Little 6 Click Score: 22    End of Session  Equipment Utilized During Treatment: Gait belt Activity Tolerance: Patient tolerated treatment well Patient left: in chair;with call bell/phone within reach;with chair alarm set Nurse Communication: Mobility status PT Visit Diagnosis: Other abnormalities of gait and mobility (R26.89);Pain Pain - Right/Left: Right Pain - part of body: Knee    Time: 0921-0946 PT Time Calculation (min) (ACUTE ONLY): 25 min   Charges:   PT Evaluation $PT Eval Low Complexity: 1 Low PT Treatments $Therapeutic Activity: 8-22 mins PT General Charges $$ ACUTE PT VISIT: 1 Visit         Harrel Carina, DPT, CLT  Acute Rehabilitation Services Office: [phone] (Secure chat preferred)   Samantha [name] 10/08/2023, 9:55 AM

## 2023-10-08 NOTE — Progress Notes (Signed)
Mobility Specialist Progress Note:   10/08/23 0850  Orthostatic Lying   BP- Lying 134/55  Pulse- Lying 61  Orthostatic Sitting  BP- Sitting 139/49  Pulse- Sitting 63  Orthostatic Standing at 0 minutes  BP- Standing at 0 minutes 131/58  Pulse- Standing at 0 minutes 70  Mobility  Activity Stood at bedside;Transferred from bed to chair;Ambulated with assistance in room;Ambulated with assistance in hallway  Level of Assistance Contact guard assist, steadying assist  Assistive Device Front wheel walker;Other (Comment) (IV pole)  Distance Ambulated (ft) 80 ft  Activity Response Tolerated well  Mobility Referral Yes  $Mobility charge 1 Mobility  Mobility Specialist Start Time (ACUTE ONLY) 0850  Mobility Specialist Stop Time (ACUTE ONLY) 0905  Mobility Specialist Time Calculation (min) (ACUTE ONLY) 15 min   Pt received in bed, agreeable to mobility session. Recorded BP above. Denies dizziness. Ambulated in hallway with IV pole, requested assistance to bathroom, void successful. Tolerated well, c/o R knee pain. Returned pt to room, agreeable to sitting up in chair, all needs met, alarm on, call bell in reach.   Feliciana Rossetti Mobility Specialist Please contact via Special educational needs teacher or  Rehab office at [phone]

## 2023-10-08 NOTE — Hospital Course (Addendum)
Taken from H&P.  Samantha [name] is a 67 y.o. female with medical history significant of DM2, minimal valvular cardiac dz (normal EF, grade 1 DD, mild MR, mild AI), HTN, frequent recurrent sinus infections and long history of eustachian tube dysfunction.   Pt with URI symptoms for past couple of weeks.  Felt she was having another sinus infection.  Saw UC x2 days ago, started on doxycycline.   Today not feeling well, cleaning out home of recently deceased family member.  Sitting on front of a car outside house talking to family, had sudden onset syncope without warning.  Vitals mostly stable on presentation, labs with leukocytosis at 12.8, BUN 37, creatinine 1.88 with baseline seems to be around 1.1-1.2, troponin negative. EKG with NSR, LBBB, T wave inversion in lateral leads but those seems chronic. CT head and cervical spine with no acute abnormality, mild disc space height loss and osteophyte phytosis of C5-C7.  CXR with no acute cardiopulmonary finding, chronic left hemidiaphragm elevation with left basilar atelectasis.  Patient was admitted under observation for syncope workup, echocardiogram ordered.  11/11: Vital stable with mildly elevated blood pressure, improving leukocytosis with decrease in hemoglobin but all cell lines decreased, likely some dilutional effect.  Improving renal function with creatinine at 1.44.  Restarting home verapamil. Echocardiogram with low normal EF at 50 to 55%, no regional wall motion abnormalities, grade 1 diastolic dysfunction, mildly dilated left atrium. UA with leukocytosis and many bacteria, gave her 1 dose of fosfomycin.  Patient is already on doxycycline and she will complete the course which is recently prescribed by PCP.  Patient had syncopal episode without any warning.  No seizure-like activity.  No prior episodes.  She does has left bundle branch block which is chronic.  Discussed with cardiology and she will get a ZIO monitor in mail with  instructions to apply.  She will follow-up with her cardiologist in 2 weeks for the results to rule out any abnormal arrhythmia.  Patient was also instructed to hold home valsartan for 2 more days due to concern of AKI although they are improving but still not at baseline.  She was given some IV fluid and instructed to keep herself well-hydrated.  Patient will continue the rest of her home medications and need to have a close follow-up with her providers for further management and recommendations.

## 2023-10-08 NOTE — Progress Notes (Signed)
  Echocardiogram 2D Echocardiogram has been performed.  Samantha [name] 10/08/2023, 11:10 AM

## 2023-10-12 ENCOUNTER — Other Ambulatory Visit: Payer: Self-pay | Admitting: Cardiology

## 2023-10-12 DIAGNOSIS — R55 Syncope and collapse: Secondary | ICD-10-CM

## 2023-10-12 DIAGNOSIS — I447 Left bundle-branch block, unspecified: Secondary | ICD-10-CM

## 2023-10-12 NOTE — Progress Notes (Signed)
Ordered 30 day monitor for evaluation of syncope. Monitor to be read by Dr. Elease Hashimoto

## 2023-10-15 ENCOUNTER — Other Ambulatory Visit: Payer: Self-pay | Admitting: Family Medicine

## 2023-10-15 DIAGNOSIS — M1990 Unspecified osteoarthritis, unspecified site: Secondary | ICD-10-CM

## 2023-10-15 DIAGNOSIS — E1140 Type 2 diabetes mellitus with diabetic neuropathy, unspecified: Secondary | ICD-10-CM

## 2023-10-15 DIAGNOSIS — E1159 Type 2 diabetes mellitus with other circulatory complications: Secondary | ICD-10-CM

## 2023-10-18 DIAGNOSIS — E1342 Other specified diabetes mellitus with diabetic polyneuropathy: Secondary | ICD-10-CM | POA: Diagnosis not present

## 2023-10-18 DIAGNOSIS — G43909 Migraine, unspecified, not intractable, without status migrainosus: Secondary | ICD-10-CM | POA: Diagnosis not present

## 2023-10-19 DIAGNOSIS — M170 Bilateral primary osteoarthritis of knee: Secondary | ICD-10-CM | POA: Diagnosis not present

## 2023-10-20 DIAGNOSIS — I447 Left bundle-branch block, unspecified: Secondary | ICD-10-CM

## 2023-10-20 DIAGNOSIS — R55 Syncope and collapse: Secondary | ICD-10-CM | POA: Diagnosis not present

## 2023-10-21 DIAGNOSIS — R55 Syncope and collapse: Secondary | ICD-10-CM | POA: Diagnosis not present

## 2023-10-22 DIAGNOSIS — H04123 Dry eye syndrome of bilateral lacrimal glands: Secondary | ICD-10-CM | POA: Diagnosis not present

## 2023-10-22 DIAGNOSIS — H5713 Ocular pain, bilateral: Secondary | ICD-10-CM | POA: Diagnosis not present

## 2023-10-22 DIAGNOSIS — H532 Diplopia: Secondary | ICD-10-CM | POA: Diagnosis not present

## 2023-10-23 DIAGNOSIS — K5790 Diverticulosis of intestine, part unspecified, without perforation or abscess without bleeding: Secondary | ICD-10-CM | POA: Insufficient documentation

## 2023-10-30 ENCOUNTER — Other Ambulatory Visit: Payer: Self-pay | Admitting: Family Medicine

## 2023-10-30 DIAGNOSIS — E1169 Type 2 diabetes mellitus with other specified complication: Secondary | ICD-10-CM

## 2023-10-31 ENCOUNTER — Encounter: Payer: Self-pay | Admitting: Family Medicine

## 2023-10-31 ENCOUNTER — Ambulatory Visit: Payer: Medicare HMO | Admitting: Family Medicine

## 2023-10-31 VITALS — BP 124/64 | HR 70 | Temp 97.5°F | Ht 70.0 in | Wt 220.8 lb

## 2023-10-31 DIAGNOSIS — E1140 Type 2 diabetes mellitus with diabetic neuropathy, unspecified: Secondary | ICD-10-CM | POA: Diagnosis not present

## 2023-10-31 DIAGNOSIS — S8001XD Contusion of right knee, subsequent encounter: Secondary | ICD-10-CM | POA: Diagnosis not present

## 2023-10-31 DIAGNOSIS — Z794 Long term (current) use of insulin: Secondary | ICD-10-CM | POA: Diagnosis not present

## 2023-10-31 DIAGNOSIS — Z23 Encounter for immunization: Secondary | ICD-10-CM

## 2023-10-31 DIAGNOSIS — Z7984 Long term (current) use of oral hypoglycemic drugs: Secondary | ICD-10-CM

## 2023-10-31 DIAGNOSIS — J309 Allergic rhinitis, unspecified: Secondary | ICD-10-CM

## 2023-10-31 DIAGNOSIS — E782 Mixed hyperlipidemia: Secondary | ICD-10-CM | POA: Diagnosis not present

## 2023-10-31 DIAGNOSIS — E538 Deficiency of other specified B group vitamins: Secondary | ICD-10-CM | POA: Diagnosis not present

## 2023-10-31 DIAGNOSIS — I10 Essential (primary) hypertension: Secondary | ICD-10-CM

## 2023-10-31 DIAGNOSIS — E559 Vitamin D deficiency, unspecified: Secondary | ICD-10-CM

## 2023-10-31 DIAGNOSIS — Z7985 Long-term (current) use of injectable non-insulin antidiabetic drugs: Secondary | ICD-10-CM

## 2023-10-31 MED ORDER — FLUTICASONE PROPIONATE 50 MCG/ACT NA SUSP: 2.0000 | Freq: Every day | NASAL | 6 refills | Status: AC

## 2023-10-31 NOTE — Assessment & Plan Note (Signed)
I suspect her periorbital pain may represent sinus pain. I recommend we start her on a steroid nasal spray and reassess this after 1 month of therapy.

## 2023-10-31 NOTE — Assessment & Plan Note (Signed)
Blood pressure is in good control. Continue HCTZ 12.5 mg daily, valsartan 160 mg daily, and verapamil 240 mg daily.

## 2023-10-31 NOTE — Assessment & Plan Note (Signed)
I will reassess lipids today. Continue lovastatin 20 mg daily.

## 2023-10-31 NOTE — Assessment & Plan Note (Signed)
I will reassess Vitamin D levels. Continue daily supplement.

## 2023-10-31 NOTE — Assessment & Plan Note (Signed)
Not currently on therapy. I will check her B12 level today.

## 2023-10-31 NOTE — Assessment & Plan Note (Signed)
I will check annual DM labs today. Continue metformin 1,000 mg bid, insulin degludec 14 units sq daily, and semaglutide (Ozempic) 0.25 mg weekly.

## 2023-10-31 NOTE — Progress Notes (Signed)
[facility] [address] [place] [address] Dept: [phone] Dept Fax: [phone]  Transfer of Care Office Visit  Subjective:    Patient ID: Samantha [name], female    DOB: [birth year], 67 y.o..   MRN: [id number]  Chief Complaint  Patient presents with   Establish Care    NP- establish care.  C/o RT eye pain    History of Present Illness:  Patient is in today to establish care. Ms. Navidad was born in [place]. She attended [employer], traingin in computers. 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 She has been married for 26 years. she has a daughter (48). She denies use of tobacco or alcohol.  Ms. Krolik has Type 2 diabetes diagnosed in 2016. This is complicated with microalbuminuria and neuropathy. She is managed on metformin 1,000 mg bid, insulin degludec 14 units sq daily, and semaglutide (Ozempic) 0.25 mg weekly. Her neuropathy is managed with gabapentin 300 mg 1 cap each morning and 2 caps each evening.  Ms. Frate has hypertension. She is managed on HCTZ 12.5 mg daily, valsartan 160 mg daily, and verapamil 240 mg daily.  Ms. Vanasten has hyperlipidemia. She is managed on lovastatin 20 mg daily.  Ms. Westerkamp has arthritis involving her lower spine, hips, and knees. She use both a Voltaren gel and periodic diclofenac (Voltaren) orally.  Ms. Vranich notes she had a fall on Nov. 1st. She bruised her right knee and this has not resolved.  Ms. Stelmack had an episode of syncope on Nov. 10th. She was seen in the ED. She had CT scans of the cervical spine and head which did not show any acute injury.  Ms. Vaziri notes she had a recent evaluation of pain in and around her right eye. She notes it is tender to palpation around the orbital rim and it feels like a sharp knife sticking in her eye at times. She has kept up with her eye exams and there is no history of glaucoma. She was  seen recently by Dr. Sherryll Burger who felt this might be due to migraines. He prescribed Imitrex. She has not found this to be helpful.   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   Spondylosis of lumbar spine 07/19/2018   Lumbar radiculopathy 07/05/2018   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/21/2018   Eustachian tube dysfunction, bilateral 02/21/2018   Recurrent sinusitis 02/21/2018   GERD (gastroesophageal reflux disease) 10/24/2017   Pulmonary nodule, right 08/14/2017   Obesity (BMI 30-39.9) 06/11/2017   LBBB (left bundle branch block) 08/10/2016   Leukocytosis 08/10/2016   Essential hypertension 08/10/2016   Type 2 diabetes mellitus with diabetic neuropathy, with long-term current use of insulin (HCC) 11/20/2015   Past Surgical History:  Procedure Laterality Date   CATARACT EXTRACTION W/PHACO Left 07/11/2022   Procedure: CATARACT EXTRACTION PHACO AND INTRAOCULAR LENS PLACEMENT (IOC) LEFT  DIABETIC;  Surgeon: Galen Manila, MD;  Location: [facility];  Service: Ophthalmology;  Laterality: Left;  3.60 0:22.7   CATARACT EXTRACTION W/PHACO Right 07/25/2022   Procedure: CATARACT EXTRACTION PHACO AND INTRAOCULAR LENS PLACEMENT (IOC) RIGHT DIABETIC 5.12 00:33.0;  Surgeon: Galen Manila, MD;  Location: [facility];  Service: Ophthalmology;  Laterality: Right;   KNEE  ARTHROSCOPY Right 2017   LEFT HEART CATH AND CORONARY ANGIOGRAPHY N/A 06/30/2021   Procedure: LEFT HEART CATH AND CORONARY ANGIOGRAPHY;  Surgeon: Lennette Bihari, MD;  Location: [facility] INVAS[facility] CV LAB;  Service: Cardiovascular;  Laterality: N/A;   MIDDLE EAR SURGERY Right    TONSILLECTOMY AND ADENOIDECTOMY  1962   Family History  Problem Relation Age of Onset   Diabetes Mother    Hypertension Mother    CAD Mother    Hypertension Father    Diabetes Father    CAD Father    Diabetes Sister    Hypertension Sister    Diabetes Brother    Hyperlipidemia Brother    Hypertension Brother    CAD Brother    Cancer Paternal Uncle        Brain   Cancer Paternal Grandmother        Colon   Kidney disease Paternal Grandfather    Outpatient Medications Prior to Visit  Medication Sig Dispense Refill   Calcium Citrate-Vitamin D (CALCIUM + D PO) Take 1 tablet by mouth every morning.      cetirizine (ZYRTEC) 10 MG tablet Take 10 mg by mouth daily.     diclofenac (VOLTAREN) 75 MG EC tablet Take 1 tablet by mouth twice daily as needed 60 tablet 0   diclofenac Sodium (VOLTAREN) 1 % GEL Apply 2 g topically 4 (four) times daily. 100 g 3   FLUoxetine (PROZAC) 40 MG capsule Take 1 capsule (40 mg total) by mouth daily. 90 capsule 1   gabapentin (NEURONTIN) 300 MG capsule TAKE 1 CAPSULE BY MOUTH IN THE MORNING AND 2 IN THE EVENING 270 capsule 0   hydrochlorothiazide (MICROZIDE) 12.5 MG capsule Take 1 capsule by mouth once daily 90 capsule 0   Insulin Degludec FlexTouch 100 UNIT/ML SOPN INJECT 12 UNITS SUBCUTANEOUSLY ONCE DAILY (Patient taking differently: Inject 14 Units into the skin at bedtime. INJECT 12 UNITS SUBCUTANEOUSLY ONCE DAILY) 15 mL 0   lovastatin (MEVACOR) 20 MG tablet Take 1 tablet (20 mg total) by mouth at bedtime. (Patient taking differently: Take 20 mg by mouth daily.) 90 tablet 0   metFORMIN (GLUCOPHAGE) 1000 MG tablet TAKE 1 TABLET BY MOUTH TWICE DAILY WITH A MEAL 180 tablet 0   nitroGLYCERIN  (NITROSTAT) 0.4 MG SL tablet Place 1 tablet (0.4 mg total) under the tongue every 5 (five) minutes as needed for chest pain. 30 tablet 12   pantoprazole (PROTONIX) 40 MG tablet Take 1 tablet by mouth once daily (Patient taking differently: Take 40 mg by mouth at bedtime.) 90 tablet 0   Semaglutide,0.25 or 0.5MG /DOS, (OZEMPIC, 0.25 OR 0.5 MG/DOSE,) 2 MG/3ML SOPN INJECT 1/4 (ONE-FOURTH) MG SUBCUTANEOUSLY  ONCE A WEEK 3 mL 0   SUMAtriptan (IMITREX) 50 MG tablet Take 50 mg by mouth.     valsartan (DIOVAN) 160 MG tablet Take 1 tablet by mouth once daily 90 tablet 0   verapamil (CALAN-SR) 240 MG CR tablet Take 1 tablet (240 mg total) by mouth every morning. (Patient taking differently: Take 240 mg by mouth at bedtime.) 90 tablet 0   aspirin 81 MG tablet Take 81 mg by  mouth every morning.      azelastine (ASTELIN) 0.1 % nasal spray Place 1 spray into both nostrils daily as needed for allergies.  (Patient not taking: Reported on 10/31/2023)     benzonatate (TESSALON) 100 MG capsule Take 1 capsule (100 mg total) by mouth every 8 (eight) hours as needed for cough. (Patient not taking: Reported on 10/31/2023) 21 capsule 0   Omega-3 Fatty Acids (FISH OIL) 1000 MG CAPS Take 1,000 mg by mouth every morning.  (Patient not taking: Reported on 10/31/2023)     No facility-administered medications prior to visit.   Allergies  Allergen Reactions   Iohexol Anaphylaxis     Code: SOB, Desc: respiratory distress-ASM, Onset Date: [id number]    Iodinated Contrast Media Other (See Comments)   Jardiance [Empagliflozin] Rash      Objective:   Today's Vitals   10/31/23 1352  BP: 124/64  Pulse: 70  Temp: (!) 97.5 F (36.4 C)  SpO2: 97%  Weight: 220 lb 12.8 oz (100.2 kg)  Height: 5\' 10"  (1.778 m)   Body mass index is 31.68 kg/m.   General: Well developed, well nourished. No acute distress. HEENT: Normocephalic, non-traumatic. PERRL, EOMI. Conjunctiva clear. There eis pain to palpation over the   frontal and medial  aspect of the eye. The nasal mucosa is swollen and pale.   Extremities: There eis a round bruise over the anteromedial aspect of the right knee. Psych: Alert and oriented. Normal mood and affect.  Health Maintenance Due  Topic Date Due   INFLUENZA VACCINE  06/28/2023     Assessment & Plan:   Problem List Items Addressed This Visit       Cardiovascular and Mediastinum   Essential hypertension - Primary    Blood pressure is in good control. Continue HCTZ 12.5 mg daily, valsartan 160 mg daily, and verapamil 240 mg daily.      Relevant Orders   Microalbumin / creatinine urine ratio   Basic metabolic panel     Respiratory   Allergic rhinitis    I suspect her periorbital pain may represent sinus pain. I recommend we start her on a steroid nasal spray and reassess this after 1 month of therapy.      Relevant Medications   fluticasone (FLONASE) 50 MCG/ACT nasal spray     Endocrine   Type 2 diabetes mellitus with diabetic neuropathy, with long-term current use of insulin (HCC) (Chronic)    I will check annual DM labs today. Continue metformin 1,000 mg bid, insulin degludec 14 units sq daily, and semaglutide (Ozempic) 0.25 mg weekly.      Relevant Orders   Lipid panel   Microalbumin / creatinine urine ratio   Basic metabolic panel   Hemoglobin A1c   Urinalysis, Routine w reflex microscopic     Other   B12 deficiency    Not currently on therapy. I will check her B12 level today.      Relevant Orders   Vitamin B12   Hyperlipidemia    I will reassess lipids today. Continue lovastatin 20 mg daily.      Relevant Orders   Lipid panel   Vitamin D deficiency    I will reassess Vitamin D levels. Continue daily supplement.      Relevant Orders   VITAMIN D 25 Hydroxy (Vit-D Deficiency, Fractures)   Other Visit Diagnoses     Contusion of right knee, subsequent encounter       Need for influenza vaccination  Relevant Orders   Flu Vaccine Trivalent High Dose (Fluad)  (Completed)       Return in about 3 months (around 01/29/2024) for Reassessment.   Loyola Mast, MD

## 2023-11-01 ENCOUNTER — Encounter: Payer: Self-pay | Admitting: Optometry

## 2023-11-01 ENCOUNTER — Ambulatory Visit: Payer: Medicare HMO | Admitting: Family Medicine

## 2023-11-01 ENCOUNTER — Other Ambulatory Visit: Payer: Self-pay | Admitting: Optometry

## 2023-11-01 ENCOUNTER — Encounter: Payer: Self-pay | Admitting: Family Medicine

## 2023-11-01 DIAGNOSIS — H532 Diplopia: Secondary | ICD-10-CM

## 2023-11-01 DIAGNOSIS — N1831 Chronic kidney disease, stage 3a: Secondary | ICD-10-CM | POA: Insufficient documentation

## 2023-11-01 DIAGNOSIS — E1122 Type 2 diabetes mellitus with diabetic chronic kidney disease: Secondary | ICD-10-CM | POA: Insufficient documentation

## 2023-11-01 LAB — LIPID PANEL
Cholesterol: 141 mg/dL (ref 0–200)
HDL: 59.4 mg/dL (ref >39.00)
LDL Cholesterol: 38 mg/dL (ref 0–99)
NonHDL: 82.01
Total CHOL/HDL Ratio: 2
Triglycerides: 221 mg/dL — ABNORMAL HIGH (ref 0.0–149.0)
VLDL: 44.2 mg/dL — ABNORMAL HIGH (ref 0.0–40.0)

## 2023-11-01 LAB — BASIC METABOLIC PANEL
BUN: 35 mg/dL — ABNORMAL HIGH (ref 6–23)
CO2: 24 meq/L (ref 19–32)
Calcium: 8.8 mg/dL (ref 8.4–10.5)
Chloride: 99 meq/L (ref 96–112)
Creatinine, Ser: 1.21 mg/dL — ABNORMAL HIGH (ref 0.40–1.20)
GFR: 45.68 mL/min — ABNORMAL LOW (ref >60.00)
Glucose, Bld: 103 mg/dL — ABNORMAL HIGH (ref 70–99)
Potassium: 4.9 meq/L (ref 3.5–5.1)
Sodium: 134 meq/L — ABNORMAL LOW (ref 135–145)

## 2023-11-01 LAB — MICROALBUMIN / CREATININE URINE RATIO
Creatinine,U: 212.7 mg/dL
Microalb Creat Ratio: 0.5 mg/g (ref 0.0–30.0)
Microalb, Ur: 1.1 mg/dL (ref 0.0–1.9)

## 2023-11-01 LAB — HEMOGLOBIN A1C: Hgb A1c MFr Bld: 7.1 % — ABNORMAL HIGH (ref 4.6–6.5)

## 2023-11-01 LAB — VITAMIN B12: Vitamin B-12: 228 pg/mL (ref 211–911)

## 2023-11-01 LAB — VITAMIN D 25 HYDROXY (VIT D DEFICIENCY, FRACTURES): VITD: 33.5 ng/mL (ref 30.00–100.00)

## 2023-11-01 MED ORDER — OZEMPIC (0.25 OR 0.5 MG/DOSE) 2 MG/3ML ~~LOC~~ SOPN: 0.5000 mg | PEN_INJECTOR | SUBCUTANEOUS | 5 refills | Status: DC

## 2023-11-01 NOTE — Addendum Note (Signed)
Addended by: Loyola Mast on: 11/01/2023 04:32 PM   Modules accepted: Orders

## 2023-11-03 ENCOUNTER — Ambulatory Visit
Admission: EM | Admit: 2023-11-03 | Discharge: 2023-11-03 | Disposition: A | Discharge status: Home / Self Care | Payer: Medicare HMO | Attending: Physician Assistant | Admitting: Physician Assistant

## 2023-11-03 DIAGNOSIS — J069 Acute upper respiratory infection, unspecified: Secondary | ICD-10-CM

## 2023-11-03 LAB — POC COVID19/FLU A&B COMBO
Covid Antigen, POC: NEGATIVE
Influenza A Antigen, POC: NEGATIVE
Influenza B Antigen, POC: NEGATIVE

## 2023-11-03 NOTE — ED Triage Notes (Signed)
"  My sinus's are hurting with my ears and throat hurting as well with Cough". My symptoms started "yesterday". No fever.

## 2023-11-03 NOTE — ED Provider Notes (Signed)
[name]    CSN: [id number] Arrival date & time: 11/03/23  6606      History   Chief Complaint Chief Complaint  Patient presents with   Cough   Sinus Problem   Otalgia    HPI Samantha [name] is a 67 y.o. female.   Patient here today for evaluation of sinus congestion and ear pain with throat soreness that started yesterday.  She has had some cough as well.  She denies any fever.  She does not report treatment for symptoms.  The history is provided by the patient.    Past Medical History:  Diagnosis Date   Arthritis    Dyslipidemia    Heart disease    Heart murmur 2011   Left bundle branch block 2011   Type II or unspecified type diabetes mellitus without mention of complication, not stated as uncontrolled    Unspecified essential hypertension     Patient Active Problem List   Diagnosis Date Noted   Stage 3a chronic kidney disease (CKD) (HCC) 11/01/2023   Type 2 diabetes mellitus with diabetic chronic kidney disease (HCC) 11/01/2023   Diabetic neuropathy (HCC) 10/31/2023   Allergic rhinitis 10/31/2023   Diverticulosis 10/23/2023   Syncope 10/07/2023   Neuroma of foot 02/20/2022   Unstable angina (HCC)    Arthritis 01/14/2021   History of colonic polyps 01/14/2021   Hyperlipidemia 02/23/2020   Microalbuminuria due to type 2 diabetes mellitus (HCC) 02/23/2020   B12 deficiency 02/23/2020   Vitamin D deficiency 02/23/2020   Generalized anxiety disorder 02/23/2020   Degenerative disc disease, lumbar 07/19/2018   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   Spondylosis of lumbar spine 07/19/2018   Lumbar radiculopathy 07/05/2018   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/21/2018   Eustachian tube dysfunction, bilateral 02/21/2018   Recurrent sinusitis 02/21/2018   GERD (gastroesophageal reflux disease) 10/24/2017   Pulmonary nodule, right 08/14/2017   Obesity (BMI 30-39.9) 06/11/2017   LBBB  (left bundle branch block) 08/10/2016   Leukocytosis 08/10/2016   Essential hypertension 08/10/2016   Type 2 diabetes mellitus with diabetic neuropathy, with long-term current use of insulin (HCC) 11/20/2015    Past Surgical History:  Procedure Laterality Date   CATARACT EXTRACTION W/PHACO Left 07/11/2022   Procedure: CATARACT EXTRACTION PHACO AND INTRAOCULAR LENS PLACEMENT (IOC) LEFT  DIABETIC;  Surgeon: Galen Manila, MD;  Location: [facility];  Service: Ophthalmology;  Laterality: Left;  3.60 0:22.7   CATARACT EXTRACTION W/PHACO Right 07/25/2022   Procedure: CATARACT EXTRACTION PHACO AND INTRAOCULAR LENS PLACEMENT (IOC) RIGHT DIABETIC 5.12 00:33.0;  Surgeon: Galen Manila, MD;  Location: [facility];  Service: Ophthalmology;  Laterality: Right;   KNEE ARTHROSCOPY Right 2017   LEFT HEART CATH AND CORONARY ANGIOGRAPHY N/A 06/30/2021   Procedure: LEFT HEART CATH AND CORONARY ANGIOGRAPHY;  Surgeon: Lennette Bihari, MD;  Location: [facility];  Service: Cardiovascular;  Laterality: N/A;   MIDDLE EAR SURGERY Right    TONSILLECTOMY AND ADENOIDECTOMY  1962    OB History   No obstetric history on file.      Home Medications    Prior to Admission medications   Medication Sig Start Date End Date Taking? Authorizing Provider  azelastine (ASTELIN) 0.1 % nasal spray Place 1 spray into both nostrils daily as needed for allergies.  Patient not taking: Reported on 10/31/2023    [provider]  Calcium Citrate-Vitamin D (CALCIUM + D PO) Take 1 tablet by  mouth every morning.     [provider]  cetirizine (ZYRTEC) 10 MG tablet Take 10 mg by mouth daily.    [provider]  diclofenac (VOLTAREN) 75 MG EC tablet Take 1 tablet by mouth twice daily as needed 10/15/23   Saralyn Pilar A, PA  diclofenac Sodium (VOLTAREN) 1 % GEL Apply 2 g topically 4 (four) times daily. 07/13/21   Prosperi, Christian H, PA-C  FLUoxetine (PROZAC) 40 MG capsule  Take 1 capsule (40 mg total) by mouth daily. 08/01/23   Melida Quitter, PA  fluticasone (FLONASE) 50 MCG/ACT nasal spray Place 2 sprays into both nostrils daily. 10/31/23   Loyola Mast, MD  gabapentin (NEURONTIN) 300 MG capsule TAKE 1 CAPSULE BY MOUTH IN THE MORNING AND 2 IN THE EVENING 10/31/23   Loyola Mast, MD  hydrochlorothiazide (MICROZIDE) 12.5 MG capsule Take 1 capsule by mouth once daily 09/06/23   Saralyn Pilar A, PA  Insulin Degludec FlexTouch 100 UNIT/ML SOPN INJECT 12 UNITS SUBCUTANEOUSLY ONCE DAILY Patient taking differently: Inject 14 Units into the skin at bedtime. INJECT 12 UNITS SUBCUTANEOUSLY ONCE DAILY 04/25/23   Saralyn Pilar A, PA  lovastatin (MEVACOR) 20 MG tablet Take 1 tablet (20 mg total) by mouth at bedtime. Patient taking differently: Take 20 mg by mouth daily. 08/01/23   Melida Quitter, PA  metFORMIN (GLUCOPHAGE) 1000 MG tablet TAKE 1 TABLET BY MOUTH TWICE DAILY WITH A MEAL 10/31/23   Loyola Mast, MD  nitroGLYCERIN (NITROSTAT) 0.4 MG SL tablet Place 1 tablet (0.4 mg total) under the tongue every 5 (five) minutes as needed for chest pain. 06/29/21   Nahser, Deloris Ping, MD  pantoprazole (PROTONIX) 40 MG tablet Take 1 tablet by mouth once daily Patient taking differently: Take 40 mg by mouth at bedtime. 10/16/22   Carlean Jews, NP  Semaglutide,0.25 or 0.5MG /DOS, (OZEMPIC, 0.25 OR 0.5 MG/DOSE,) 2 MG/3ML SOPN Inject 0.5 mg into the muscle once a week. 11/01/23   Loyola Mast, MD  SUMAtriptan (IMITREX) 50 MG tablet Take 50 mg by mouth. 10/18/23 10/17/24  [provider]  valsartan (DIOVAN) 160 MG tablet Take 1 tablet by mouth once daily 10/15/23   Saralyn Pilar A, PA  verapamil (CALAN-SR) 240 MG CR tablet Take 1 tablet (240 mg total) by mouth every morning. Patient taking differently: Take 240 mg by mouth at bedtime. 08/01/23   Melida Quitter, PA    Family History Family History  Problem Relation Age of Onset   Diabetes Mother    Hypertension  Mother    CAD Mother    Hypertension Father    Diabetes Father    CAD Father    Diabetes Sister    Hypertension Sister    Diabetes Brother    Hyperlipidemia Brother    Hypertension Brother    CAD Brother    Cancer Paternal Uncle        Brain   Cancer Paternal Grandmother        Colon   Kidney disease Paternal Grandfather     Social History Social History   Tobacco Use   Smoking status: Never    Passive exposure: Never   Smokeless tobacco: Never  Vaping Use   Vaping status: Never Used  Substance Use Topics   Alcohol use: No   Drug use: No     Allergies   Iohexol, Iodinated contrast media, and Jardiance [empagliflozin]   Review of Systems Review of Systems  Constitutional:  Negative for  chills and fever.  HENT:  Positive for congestion, ear pain, sinus pressure and sore throat.   Eyes:  Negative for discharge and redness.  Respiratory:  Positive for cough. Negative for shortness of breath and wheezing.   Gastrointestinal:  Negative for abdominal pain, diarrhea, nausea and vomiting.     Physical Exam Triage Vital Signs ED Triage Vitals  Encounter Vitals Group     BP      Systolic BP Percentile      Diastolic BP Percentile      Pulse      Resp      Temp      Temp src      SpO2      Weight      Height      Head Circumference      Peak Flow      Pain Score      Pain Loc      Pain Education      Exclude from Growth Chart    No data found.  Updated Vital Signs BP (!) 137/98 (BP Location: Right Arm) Comment: To recheck later during or after visit.  Pulse 82   Temp 97.7 F (36.5 C) (Oral)   Resp (!) 22   Ht 5\' 10"  (1.778 m)   Wt 220 lb 14.4 oz (100.2 kg)   SpO2 94%   BMI 31.70 kg/m   Visual Acuity Right Eye Distance:   Left Eye Distance:   Bilateral Distance:    Right Eye Near:   Left Eye Near:    Bilateral Near:     Physical Exam Vitals and nursing note reviewed.  Constitutional:      General: She is not in acute distress.     Appearance: Normal appearance. She is not ill-appearing.  HENT:     Head: Normocephalic and atraumatic.     Right Ear: Tympanic membrane normal.     Left Ear: Tympanic membrane normal.     Nose: Congestion present.     Mouth/Throat:     Mouth: Mucous membranes are moist.     Pharynx: No oropharyngeal exudate or posterior oropharyngeal erythema.  Eyes:     Conjunctiva/sclera: Conjunctivae normal.  Cardiovascular:     Rate and Rhythm: Normal rate and regular rhythm.     Heart sounds: Normal heart sounds. No murmur heard. Pulmonary:     Effort: Pulmonary effort is normal. No respiratory distress.     Breath sounds: Normal breath sounds. No wheezing, rhonchi or rales.  Skin:    General: Skin is warm and dry.  Neurological:     Mental Status: She is alert.  Psychiatric:        Mood and Affect: Mood normal.        Thought Content: Thought content normal.      UC Treatments / Results  Labs (all labs ordered are listed, but only abnormal results are displayed) Labs Reviewed  POC COVID19/FLU A&B COMBO    EKG   Radiology No results found.  Procedures Procedures (including critical care time)  Medications Ordered in UC Medications - No data to display  Initial Impression / Assessment and Plan / UC Course  I have reviewed the triage vital signs and the nursing notes.  Pertinent labs & imaging results that were available during my care of the patient were reviewed by me and considered in my medical decision making (see chart for details).    Suspect viral etiology of symptoms.  COVID and flu  screening in office were negative.  Encouraged follow-up if no gradual improvement over the next several days with any further concerns.  Patient frustrated that she did not receive antibiotics.  Discussed that given this is most likely a viral illness antibiotics are not necessary at this time but will consider if symptoms persist.  Final Clinical Impressions(s) / UC Diagnoses   Final  diagnoses:  Acute upper respiratory infection   Discharge Instructions   None    ED Prescriptions   None    PDMP not reviewed this encounter.   Tomi Bamberger, PA-C 11/03/23 1106

## 2023-11-03 NOTE — ED Notes (Signed)
"  Also, I just got my Flu vaccine on Wednesday".

## 2023-11-05 ENCOUNTER — Encounter: Payer: Self-pay | Admitting: Nurse Practitioner

## 2023-11-05 ENCOUNTER — Ambulatory Visit (INDEPENDENT_AMBULATORY_CARE_PROVIDER_SITE_OTHER): Payer: Medicare HMO | Admitting: Nurse Practitioner

## 2023-11-05 VITALS — BP 144/72 | HR 82 | Temp 98.1°F | Resp 18 | Ht 70.0 in | Wt 219.0 lb

## 2023-11-05 DIAGNOSIS — J209 Acute bronchitis, unspecified: Secondary | ICD-10-CM

## 2023-11-05 DIAGNOSIS — J00 Acute nasopharyngitis [common cold]: Secondary | ICD-10-CM

## 2023-11-05 MED ORDER — ALBUTEROL SULFATE HFA 108 (90 BASE) MCG/ACT IN AERS: 1.0000 | INHALATION_SPRAY | Freq: Four times a day (QID) | RESPIRATORY_TRACT | 0 refills | Status: AC | PRN

## 2023-11-05 MED ORDER — PREDNISONE 20 MG PO TABS: 40.0000 mg | ORAL_TABLET | Freq: Every day | ORAL | 0 refills | Status: DC

## 2023-11-05 MED ORDER — BENZONATATE 100 MG PO CAPS: 100.0000 mg | ORAL_CAPSULE | Freq: Three times a day (TID) | ORAL | 0 refills | Status: DC | PRN

## 2023-11-05 NOTE — Patient Instructions (Signed)
URI Instructions: Avoid decongestants if you have high blood pressure. Encourage adequate oral hydration. Use over-the-counter  cold" medicine  such as coricidin HBP cough and congestion.  Use mucinex DM or Robitussin  or delsym for cough without congestion  You can use plain "Tylenol" or "Advil" for fever, chills and achyness. Use cool mist humidifier at bedtime to help with nasal congestion and cough.  Cold/cough medications may have tylenol or ibuprofen or guaifenesin or dextromethophan in them, so be careful not to take beyond the recommended dose for each of these medications.   "Common cold" symptoms are usually triggered by a virus.  The antibiotics are usually not necessary. On average, a" viral cold" illness may take 7-10 days to resolve. Please, make an appointment if you are not better or if you're worse.

## 2023-11-05 NOTE — Progress Notes (Signed)
Acute Office Visit  Subjective:    Patient ID: Samantha [name], female    DOB: Feb 06, 1956, 67 y.o.   MRN: [id number]  Chief Complaint  Patient presents with   Acute Visit    PT C/O of sinus drainage, ear and throat pain, headache, congestion and coughing very dry since Thursday. PT went to Urgent Care on Saturday 11/03/2023 for symptoms and tested negative for COVID and FLU   URI  This is a new problem. The current episode started in the past 7 days. The problem has been unchanged. There has been no fever. Associated symptoms include congestion, coughing, headaches, joint pain, a plugged ear sensation, sinus pain and a sore throat. Pertinent negatives include no abdominal pain, chest pain, diarrhea, dysuria, ear pain, joint swelling, nausea, neck pain, rash, rhinorrhea, sneezing, swollen glands, vomiting or wheezing. She has tried increased fluids for the symptoms. The treatment provided no relief.  Completed course of doxycycline x7days 09/2023 for sinusitis and cough. Elevated BP this Am due to lack of BP med. Glucose this AM 106 Last hgbA1c at 7.1% 5days ago  Outpatient Medications Prior to Visit  Medication Sig   azelastine (ASTELIN) 0.1 % nasal spray Place 1 spray into both nostrils daily as needed for allergies.   Calcium Citrate-Vitamin D (CALCIUM + D PO) Take 1 tablet by mouth every morning.    cetirizine (ZYRTEC) 10 MG tablet Take 10 mg by mouth daily.   diclofenac (VOLTAREN) 75 MG EC tablet Take 1 tablet by mouth twice daily as needed   diclofenac Sodium (VOLTAREN) 1 % GEL Apply 2 g topically 4 (four) times daily.   FLUoxetine (PROZAC) 40 MG capsule Take 1 capsule (40 mg total) by mouth daily.   fluticasone (FLONASE) 50 MCG/ACT nasal spray Place 2 sprays into both nostrils daily.   gabapentin (NEURONTIN) 300 MG capsule TAKE 1 CAPSULE BY MOUTH IN THE MORNING AND 2 IN THE EVENING   hydrochlorothiazide (MICROZIDE) 12.5 MG capsule Take 1 capsule by mouth once daily   Insulin  Degludec FlexTouch 100 UNIT/ML SOPN INJECT 12 UNITS SUBCUTANEOUSLY ONCE DAILY (Patient taking differently: Inject 14 Units into the skin at bedtime. INJECT 12 UNITS SUBCUTANEOUSLY ONCE DAILY)   lovastatin (MEVACOR) 20 MG tablet Take 1 tablet (20 mg total) by mouth at bedtime. (Patient taking differently: Take 20 mg by mouth daily.)   meloxicam (MOBIC) 15 MG tablet Take by mouth.   metFORMIN (GLUCOPHAGE) 1000 MG tablet TAKE 1 TABLET BY MOUTH TWICE DAILY WITH A MEAL   nitroGLYCERIN (NITROSTAT) 0.4 MG SL tablet Place 1 tablet (0.4 mg total) under the tongue every 5 (five) minutes as needed for chest pain.   pantoprazole (PROTONIX) 40 MG tablet Take 1 tablet by mouth once daily (Patient taking differently: Take 40 mg by mouth at bedtime.)   Semaglutide,0.25 or 0.5MG /DOS, (OZEMPIC, 0.25 OR 0.5 MG/DOSE,) 2 MG/3ML SOPN Inject 0.5 mg into the muscle once a week.   SUMAtriptan (IMITREX) 50 MG tablet Take 50 mg by mouth.   valsartan (DIOVAN) 160 MG tablet Take 1 tablet by mouth once daily   verapamil (CALAN-SR) 240 MG CR tablet Take 1 tablet (240 mg total) by mouth every morning. (Patient taking differently: Take 240 mg by mouth at bedtime.)   [START ON 11/17/2023] Galcanezumab-gnlm 120 MG/ML SOAJ Inject into the skin.   No facility-administered medications prior to visit.    Reviewed past medical and social history.  Review of Systems  HENT:  Positive for congestion, sinus pain and sore  throat. Negative for ear pain, rhinorrhea and sneezing.   Respiratory:  Positive for cough. Negative for wheezing.   Cardiovascular:  Negative for chest pain.  Gastrointestinal:  Negative for abdominal pain, diarrhea, nausea and vomiting.  Genitourinary:  Negative for dysuria.  Musculoskeletal:  Positive for joint pain. Negative for neck pain.  Skin:  Negative for rash.  Neurological:  Positive for headaches.   Per HPI     Objective:    Physical Exam Vitals and nursing note reviewed.  HENT:     Right Ear: Ear  canal and external ear normal. No middle ear effusion. Tympanic membrane is scarred. Tympanic membrane is not injected, perforated, erythematous or retracted.     Left Ear: Ear canal and external ear normal.  No middle ear effusion. Tympanic membrane is scarred. Tympanic membrane is not injected, perforated, erythematous or retracted.  Cardiovascular:     Rate and Rhythm: Normal rate and regular rhythm.     Pulses: Normal pulses.     Heart sounds: Normal heart sounds.  Pulmonary:     Effort: Pulmonary effort is normal.     Breath sounds: Normal breath sounds. No wheezing, rhonchi or rales.     Comments: Diminished lung sounds in LLL: chronic secondary to chronic elevation of the left hemidiaphragm with left basilar atelectasis/scarring.  Chest:     Chest wall: No tenderness.  Lymphadenopathy:     Cervical: No cervical adenopathy.    BP (!) 144/72 (BP Location: Right Arm, Patient Position: Sitting, Cuff Size: Large) Comment: recheck done manual  Pulse 82   Temp 98.1 F (36.7 C) (Oral)   Resp 18   Ht 5\' 10"  (1.778 m)   Wt 219 lb (99.3 kg)   SpO2 97%   BMI 31.42 kg/m    No results found for any visits on 11/05/23.     Assessment & Plan:   Problem List Items Addressed This Visit   None Visit Diagnoses     Acute nasopharyngitis    -  Primary   Relevant Medications   predniSONE (DELTASONE) 20 MG tablet   benzonatate (TESSALON) 100 MG capsule   albuterol (VENTOLIN HFA) 108 (90 Base) MCG/ACT inhaler   Acute bronchitis, unspecified organism       Relevant Medications   predniSONE (DELTASONE) 20 MG tablet   benzonatate (TESSALON) 100 MG capsule   albuterol (VENTOLIN HFA) 108 (90 Base) MCG/ACT inhaler      Meds ordered this encounter  Medications   predniSONE (DELTASONE) 20 MG tablet    Sig: Take 2 tablets (40 mg total) by mouth daily with breakfast.    Dispense:  6 tablet    Refill:  0    Order Specific Question:   Supervising Provider    Answer:   Nadene Rubins  ALFRED [[id number]]   benzonatate (TESSALON) 100 MG capsule    Sig: Take 1-2 capsules (100-200 mg total) by mouth 3 (three) times daily as needed.    Dispense:  21 capsule    Refill:  0    Order Specific Question:   Supervising Provider    Answer:   Nadene Rubins ALFRED [[id number]]   albuterol (VENTOLIN HFA) 108 (90 Base) MCG/ACT inhaler    Sig: Inhale 1-2 puffs into the lungs every 6 (six) hours as needed.    Dispense:  8 g    Refill:  0    Order Specific Question:   Supervising Provider    Answer:   Mliss Sax [[id number]]   Return if  symptoms worsen or fail to improve. In [place].  Alysia Penna, NP

## 2023-11-08 ENCOUNTER — Ambulatory Visit: Payer: Medicare HMO | Attending: Cardiovascular Disease

## 2023-11-08 DIAGNOSIS — I447 Left bundle-branch block, unspecified: Secondary | ICD-10-CM

## 2023-11-08 DIAGNOSIS — R55 Syncope and collapse: Secondary | ICD-10-CM

## 2023-11-09 ENCOUNTER — Ambulatory Visit (INDEPENDENT_AMBULATORY_CARE_PROVIDER_SITE_OTHER): Payer: Medicare HMO | Admitting: Medical

## 2023-11-09 VITALS — BP 132/70 | HR 77 | Temp 97.6°F | Resp 20 | Ht 70.0 in | Wt 222.8 lb

## 2023-11-09 DIAGNOSIS — J329 Chronic sinusitis, unspecified: Secondary | ICD-10-CM | POA: Diagnosis not present

## 2023-11-09 MED ORDER — AMOXICILLIN-POT CLAVULANATE 875-125 MG PO TABS: 1.0000 | ORAL_TABLET | Freq: Two times a day (BID) | ORAL | 0 refills | Status: DC

## 2023-11-09 NOTE — Progress Notes (Signed)
Subjective:    Patient ID: Samantha [name], female    DOB: 08-Sep-1956, 67 y.o.   MRN: [id number]  HPI  Discussed the use of AI scribe software for clinical note transcription with the patient, who gave verbal consent to proceed.  History of Present Illness   The patient, with a history of chronic eustachian tube dysfunction and recurrent sinus infections, presents with a week-long history of sore throat and tender lymph nodes bilaterally. 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 She has had ear tubes since the age of 19 due to chronic ear infections.  The patient experiences sinus infections multiple times a year, with this being the fourth episode this year. She reports the production of green mucus and pain in the frontal and maxillary sinus regions, with the right frontal area being more painful. She denies having fevers, but notes that her baseline temperature tends to run low. She also reports occasional wheezing when sick, but denies any current wheezing.  The patient has been using Flonase nasal spray for a week for nasal congestion. She was recently prescribed benzonatate cough tablets, which she has been using. She was seen in urgent care and by her primary care provider, both of whom diagnosed her with upper respiratory infection symptoms. However, she was not prescribed any antibiotics at that time. She was given prednisone, Tessalon Pearls, and albuterol.        Review of Systems  Constitutional:  Negative for chills, fatigue and fever.  HENT:  Positive for congestion, sinus pressure and sinus pain. Negative for ear pain.   Respiratory:  Positive for cough. Negative for chest tightness and wheezing.   Cardiovascular:  Negative for chest pain and palpitations.  Gastrointestinal:  Negative for abdominal pain.  Musculoskeletal:  Negative for back pain and myalgias.  Skin:  Negative for rash.   Neurological:  Negative for dizziness, syncope, weakness and headaches.  Hematological:  Positive for adenopathy.  Psychiatric/Behavioral:  Negative for behavioral problems and confusion.     Past Medical History:  Diagnosis Date   Arthritis    Dyslipidemia    Heart disease    Heart murmur 2011   Left bundle branch block 2011   Type II or unspecified type diabetes mellitus without mention of complication, not stated as uncontrolled    Unspecified essential hypertension      Social History   Socioeconomic History   Marital status: Married    Spouse name: Not on file   Number of children: 1   Years of education: Not on file   Highest education level: Associate degree: academic program  Occupational History   Not on file  Tobacco Use   Smoking status: Never    Passive exposure: Never   Smokeless tobacco: Never  Vaping Use   Vaping status: Never Used  Substance and Sexual Activity   Alcohol use: No   Drug use: No   Sexual activity: Not Currently  Other Topics Concern   Not on file  Social History Narrative   Not on file   Social Drivers of Health   Financial Resource Strain: Low Risk  (02/26/2023)   Overall Financial Resource Strain (CARDIA)    Difficulty of Paying Living Expenses: Not hard at all  Food Insecurity: No Food Insecurity (10/08/2023)   Hunger Vital Sign    Worried About Running Out of Food in the Last Year: Never true  Ran Out of Food in the Last Year: Never true  Transportation Needs: No Transportation Needs (10/08/2023)   PRAPARE - Administrator, Civil Service (Medical): No    Lack of Transportation (Non-Medical): No  Physical Activity: Insufficiently Active (02/26/2023)   Exercise Vital Sign    Days of Exercise per Week: 2 days    Minutes of Exercise per Session: 10 min  Stress: No Stress Concern Present (02/26/2023)   [employer] of Occupational Health - Occupational Stress Questionnaire    Feeling of Stress : Not at all   Social Connections: Moderately Isolated (02/26/2023)   Social Connection and Isolation Panel [NHANES]    Frequency of Communication with Friends and Family: More than three times a week    Frequency of Social Gatherings with Friends and Family: More than three times a week    Attends Religious Services: Never    Database administrator or Organizations: No    Attends Banker Meetings: Never    Marital Status: Married  Catering manager Violence: Not At Risk (10/08/2023)   Humiliation, Afraid, Rape, and Kick questionnaire    Fear of Current or Ex-Partner: No    Emotionally Abused: No    Physically Abused: No    Sexually Abused: No    Past Surgical History:  Procedure Laterality Date   CATARACT EXTRACTION W/PHACO Left 07/11/2022   Procedure: CATARACT EXTRACTION PHACO AND INTRAOCULAR LENS PLACEMENT (IOC) LEFT  DIABETIC;  Surgeon: Galen Manila, MD;  Location: [facility];  Service: Ophthalmology;  Laterality: Left;  3.60 0:22.7   CATARACT EXTRACTION W/PHACO Right 07/25/2022   Procedure: CATARACT EXTRACTION PHACO AND INTRAOCULAR LENS PLACEMENT (IOC) RIGHT DIABETIC 5.12 00:33.0;  Surgeon: Galen Manila, MD;  Location: [facility];  Service: Ophthalmology;  Laterality: Right;   KNEE ARTHROSCOPY Right 2017   LEFT HEART CATH AND CORONARY ANGIOGRAPHY N/A 06/30/2021   Procedure: LEFT HEART CATH AND CORONARY ANGIOGRAPHY;  Surgeon: Lennette Bihari, MD;  Location: [facility];  Service: Cardiovascular;  Laterality: N/A;   MIDDLE EAR SURGERY Right    TONSILLECTOMY AND ADENOIDECTOMY  1962    Family History  Problem Relation Age of Onset   Diabetes Mother    Hypertension Mother    CAD Mother    Hypertension Father    Diabetes Father    CAD Father    Diabetes Sister    Hypertension Sister    Diabetes Brother    Hyperlipidemia Brother    Hypertension Brother    CAD Brother    Cancer Paternal Uncle        Brain   Cancer Paternal Grandmother         Colon   Kidney disease Paternal Grandfather     Allergies  Allergen Reactions   Iohexol Anaphylaxis     Code: SOB, Desc: respiratory distress-ASM, Onset Date: [id number]    Iodinated Contrast Media Other (See Comments)   Jardiance [Empagliflozin] Rash    Current Outpatient Medications on File Prior to Visit  Medication Sig Dispense Refill   albuterol (VENTOLIN HFA) 108 (90 Base) MCG/ACT inhaler Inhale 1-2 puffs into the lungs every 6 (six) hours as needed. 8 g 0   azelastine (ASTELIN) 0.1 % nasal spray Place 1 spray into both nostrils daily as needed for allergies.     benzonatate (TESSALON) 100 MG capsule Take 1-2 capsules (100-200 mg total) by mouth 3 (three) times daily as needed. 21 capsule 0   Calcium Citrate-Vitamin D (CALCIUM +  D PO) Take 1 tablet by mouth every morning.      cetirizine (ZYRTEC) 10 MG tablet Take 10 mg by mouth daily.     diclofenac (VOLTAREN) 75 MG EC tablet Take 1 tablet by mouth twice daily as needed 60 tablet 0   diclofenac Sodium (VOLTAREN) 1 % GEL Apply 2 g topically 4 (four) times daily. 100 g 3   FLUoxetine (PROZAC) 40 MG capsule Take 1 capsule (40 mg total) by mouth daily. 90 capsule 1   fluticasone (FLONASE) 50 MCG/ACT nasal spray Place 2 sprays into both nostrils daily. 16 g 6   gabapentin (NEURONTIN) 300 MG capsule TAKE 1 CAPSULE BY MOUTH IN THE MORNING AND 2 IN THE EVENING 270 capsule 3   [START ON 11/17/2023] Galcanezumab-gnlm 120 MG/ML SOAJ Inject into the skin.     hydrochlorothiazide (MICROZIDE) 12.5 MG capsule Take 1 capsule by mouth once daily 90 capsule 0   Insulin Degludec FlexTouch 100 UNIT/ML SOPN INJECT 12 UNITS SUBCUTANEOUSLY ONCE DAILY (Patient taking differently: Inject 14 Units into the skin at bedtime. INJECT 12 UNITS SUBCUTANEOUSLY ONCE DAILY) 15 mL 0   lovastatin (MEVACOR) 20 MG tablet Take 1 tablet (20 mg total) by mouth at bedtime. (Patient taking differently: Take 20 mg by mouth daily.) 90 tablet 0   meloxicam (MOBIC) 15 MG tablet  Take by mouth.     metFORMIN (GLUCOPHAGE) 1000 MG tablet TAKE 1 TABLET BY MOUTH TWICE DAILY WITH A MEAL 180 tablet 3   nitroGLYCERIN (NITROSTAT) 0.4 MG SL tablet Place 1 tablet (0.4 mg total) under the tongue every 5 (five) minutes as needed for chest pain. 30 tablet 12   pantoprazole (PROTONIX) 40 MG tablet Take 1 tablet by mouth once daily (Patient taking differently: Take 40 mg by mouth at bedtime.) 90 tablet 0   predniSONE (DELTASONE) 20 MG tablet Take 2 tablets (40 mg total) by mouth daily with breakfast. 6 tablet 0   Semaglutide,0.25 or 0.5MG /DOS, (OZEMPIC, 0.25 OR 0.5 MG/DOSE,) 2 MG/3ML SOPN Inject 0.5 mg into the muscle once a week. 3 mL 5   SUMAtriptan (IMITREX) 50 MG tablet Take 50 mg by mouth.     valsartan (DIOVAN) 160 MG tablet Take 1 tablet by mouth once daily 90 tablet 0   verapamil (CALAN-SR) 240 MG CR tablet Take 1 tablet (240 mg total) by mouth every morning. (Patient taking differently: Take 240 mg by mouth at bedtime.) 90 tablet 0   No current facility-administered medications on file prior to visit.    BP 132/70   Pulse 77   Temp 97.6 F (36.4 C)   Resp 20   Ht 5\' 10"  (1.778 m)   Wt 222 lb 12.8 oz (101.1 kg)   SpO2 97%   BMI 31.97 kg/m        Objective:   Physical Exam  General Mental Status- Alert. General Appearance- Not in acute distress.   Skin General: Color- Normal Color. Moisture- Normal Moisture.  Neck Carotid Arteries- Normal color. Moisture- Normal Moisture. No carotid bruits. No JVD.  Chest and Lung Exam Auscultation: Breath Sounds:-CTA  Cardiovascular Auscultation:Rythm- RRR Murmurs & Other Heart Sounds:Auscultation of the heart reveals- No Murmurs.  Abdomen Inspection:-Inspeection Normal. Palpation/Percussion:Note:No mass. Palpation and Percussion of the abdomen reveal- Non Tender, Non Distended + BS, no rebound or guarding.   Neurologic Cranial Nerve exam:- CN III-XII intact(No nystagmus), symmetric smile. Strength:- 5/5 equal  and symmetric strength both upper and lower extremities.   Heent- frontal and maxillary sinus pressure. Canals  clear. Tm not red. Posterior pharynx. Mild pnd. Submandibualr nodes mid enlarged and mild tender.      Assessment & Plan:   Patient Instructions  Sinusitis following Uri Chronic intermittent sinusitis with acute exacerbation. Green nasal discharge, sinus pressure, and tenderness. History of frequent sinus infections (4 times this year). Currently on Flonase. -Start Augmentin for acute bacterial sinusitis. -Continue Flonase nasal spray. -History of wheezing with respiratory infections. No current wheezing. -Continue albuterol as needed for wheezing.  Chronic Eustachian Tube Dysfunction Chronic symptoms with tinnitus and pressure sensation. History of ear tube placement in childhood. -Continue current management.  follow up in 7-10 days with pcp or sooner if needed.   Esperanza Richters, PA-C

## 2023-11-09 NOTE — Patient Instructions (Signed)
Sinusitis following Uri Chronic intermittent sinusitis with acute exacerbation. Green nasal discharge, sinus pressure, and tenderness. History of frequent sinus infections (4 times this year). Currently on Flonase. -Start Augmentin for acute bacterial sinusitis. -Continue Flonase nasal spray. -History of wheezing with respiratory infections. No current wheezing. -Continue albuterol as needed for wheezing.  Chronic Eustachian Tube Dysfunction Chronic symptoms with tinnitus and pressure sensation. History of ear tube placement in childhood. -Continue current management.  follow up in 7-10 days with pcp or sooner if needed.

## 2023-11-11 ENCOUNTER — Other Ambulatory Visit: Payer: Self-pay

## 2023-11-11 ENCOUNTER — Encounter (HOSPITAL_COMMUNITY): Payer: Self-pay | Admitting: *Deleted

## 2023-11-11 ENCOUNTER — Emergency Department (HOSPITAL_COMMUNITY)
Admission: EM | Admit: 2023-11-11 | Discharge: 2023-11-12 | Disposition: A | Discharge status: Home / Self Care | Payer: Medicare HMO | Attending: Emergency Medicine | Admitting: Emergency Medicine

## 2023-11-11 DIAGNOSIS — K5730 Diverticulosis of large intestine without perforation or abscess without bleeding: Secondary | ICD-10-CM | POA: Diagnosis not present

## 2023-11-11 DIAGNOSIS — Z7984 Long term (current) use of oral hypoglycemic drugs: Secondary | ICD-10-CM | POA: Insufficient documentation

## 2023-11-11 DIAGNOSIS — J9811 Atelectasis: Secondary | ICD-10-CM | POA: Diagnosis not present

## 2023-11-11 DIAGNOSIS — R600 Localized edema: Secondary | ICD-10-CM | POA: Insufficient documentation

## 2023-11-11 DIAGNOSIS — Z79899 Other long term (current) drug therapy: Secondary | ICD-10-CM | POA: Diagnosis not present

## 2023-11-11 DIAGNOSIS — R1010 Upper abdominal pain, unspecified: Secondary | ICD-10-CM | POA: Insufficient documentation

## 2023-11-11 DIAGNOSIS — Z794 Long term (current) use of insulin: Secondary | ICD-10-CM | POA: Diagnosis not present

## 2023-11-11 DIAGNOSIS — R0601 Orthopnea: Secondary | ICD-10-CM | POA: Diagnosis not present

## 2023-11-11 DIAGNOSIS — I129 Hypertensive chronic kidney disease with stage 1 through stage 4 chronic kidney disease, or unspecified chronic kidney disease: Secondary | ICD-10-CM | POA: Insufficient documentation

## 2023-11-11 DIAGNOSIS — Z20822 Contact with and (suspected) exposure to covid-19: Secondary | ICD-10-CM | POA: Diagnosis not present

## 2023-11-11 DIAGNOSIS — E119 Type 2 diabetes mellitus without complications: Secondary | ICD-10-CM | POA: Insufficient documentation

## 2023-11-11 DIAGNOSIS — R072 Precordial pain: Secondary | ICD-10-CM | POA: Insufficient documentation

## 2023-11-11 DIAGNOSIS — R059 Cough, unspecified: Secondary | ICD-10-CM | POA: Insufficient documentation

## 2023-11-11 DIAGNOSIS — N189 Chronic kidney disease, unspecified: Secondary | ICD-10-CM | POA: Diagnosis not present

## 2023-11-11 DIAGNOSIS — R0789 Other chest pain: Secondary | ICD-10-CM | POA: Diagnosis not present

## 2023-11-11 DIAGNOSIS — R109 Unspecified abdominal pain: Secondary | ICD-10-CM

## 2023-11-11 DIAGNOSIS — R0602 Shortness of breath: Secondary | ICD-10-CM | POA: Diagnosis not present

## 2023-11-11 DIAGNOSIS — R1013 Epigastric pain: Secondary | ICD-10-CM | POA: Diagnosis not present

## 2023-11-11 DIAGNOSIS — R9389 Abnormal findings on diagnostic imaging of other specified body structures: Secondary | ICD-10-CM | POA: Diagnosis not present

## 2023-11-11 DIAGNOSIS — R079 Chest pain, unspecified: Secondary | ICD-10-CM | POA: Diagnosis not present

## 2023-11-11 NOTE — ED Triage Notes (Signed)
The pt has numerus complaints she is having chest pain for the past few hours and her feet and legs are swollen for 2-3 days  she is also c/oa cough and cold and would not tolerate the thermometer being plsced in her mouth

## 2023-11-12 ENCOUNTER — Emergency Department (HOSPITAL_COMMUNITY): Payer: Medicare HMO

## 2023-11-12 ENCOUNTER — Emergency Department (HOSPITAL_BASED_OUTPATIENT_CLINIC_OR_DEPARTMENT_OTHER): Payer: Medicare HMO

## 2023-11-12 DIAGNOSIS — R609 Edema, unspecified: Secondary | ICD-10-CM | POA: Diagnosis not present

## 2023-11-12 DIAGNOSIS — K5730 Diverticulosis of large intestine without perforation or abscess without bleeding: Secondary | ICD-10-CM | POA: Diagnosis not present

## 2023-11-12 DIAGNOSIS — J9811 Atelectasis: Secondary | ICD-10-CM | POA: Diagnosis not present

## 2023-11-12 DIAGNOSIS — R9389 Abnormal findings on diagnostic imaging of other specified body structures: Secondary | ICD-10-CM | POA: Diagnosis not present

## 2023-11-12 DIAGNOSIS — R079 Chest pain, unspecified: Secondary | ICD-10-CM | POA: Diagnosis not present

## 2023-11-12 DIAGNOSIS — R059 Cough, unspecified: Secondary | ICD-10-CM | POA: Diagnosis not present

## 2023-11-12 DIAGNOSIS — R109 Unspecified abdominal pain: Secondary | ICD-10-CM | POA: Diagnosis not present

## 2023-11-12 LAB — URINALYSIS, ROUTINE W REFLEX MICROSCOPIC
Bilirubin Urine: NEGATIVE
Glucose, UA: NEGATIVE mg/dL
Hgb urine dipstick: NEGATIVE
Ketones, ur: NEGATIVE mg/dL
Leukocytes,Ua: NEGATIVE
Nitrite: NEGATIVE
Protein, ur: NEGATIVE mg/dL
Specific Gravity, Urine: 1.013 (ref 1.005–1.030)
pH: 6 (ref 5.0–8.0)

## 2023-11-12 LAB — CBC
HCT: 35.7 % — ABNORMAL LOW (ref 36.0–46.0)
Hemoglobin: 11.3 g/dL — ABNORMAL LOW (ref 12.0–15.0)
MCH: 28 pg (ref 26.0–34.0)
MCHC: 31.7 g/dL (ref 30.0–36.0)
MCV: 88.4 fL (ref 80.0–100.0)
Platelets: 350 10*3/uL (ref 150–400)
RBC: 4.04 MIL/uL (ref 3.87–5.11)
RDW: 13.7 % (ref 11.5–15.5)
WBC: 12.4 10*3/uL — ABNORMAL HIGH (ref 4.0–10.5)
nRBC: 0 % (ref 0.0–0.2)

## 2023-11-12 LAB — BASIC METABOLIC PANEL
Anion gap: 11 (ref 5–15)
BUN: 26 mg/dL — ABNORMAL HIGH (ref 8–23)
CO2: 25 mmol/L (ref 22–32)
Calcium: 9 mg/dL (ref 8.9–10.3)
Chloride: 102 mmol/L (ref 98–111)
Creatinine, Ser: 1.22 mg/dL — ABNORMAL HIGH (ref 0.44–1.00)
GFR, Estimated: 48 mL/min — ABNORMAL LOW (ref >60)
Glucose, Bld: 133 mg/dL — ABNORMAL HIGH (ref 70–99)
Potassium: 3.9 mmol/L (ref 3.5–5.1)
Sodium: 138 mmol/L (ref 135–145)

## 2023-11-12 LAB — HEPATIC FUNCTION PANEL
ALT: 18 U/L (ref 0–44)
AST: 17 U/L (ref 15–41)
Albumin: 3.1 g/dL — ABNORMAL LOW (ref 3.5–5.0)
Alkaline Phosphatase: 47 U/L (ref 38–126)
Bilirubin, Direct: <0.1 mg/dL (ref 0.0–0.2)
Total Bilirubin: 0.6 mg/dL (ref <1.2)
Total Protein: 6.6 g/dL (ref 6.5–8.1)

## 2023-11-12 LAB — TROPONIN I (HIGH SENSITIVITY)
Troponin I (High Sensitivity): 8 ng/L (ref <18)
Troponin I (High Sensitivity): 9 ng/L (ref <18)

## 2023-11-12 LAB — BRAIN NATRIURETIC PEPTIDE: B Natriuretic Peptide: 122 pg/mL — ABNORMAL HIGH (ref 0.0–100.0)

## 2023-11-12 LAB — RESP PANEL BY RT-PCR (RSV, FLU A&B, COVID)  RVPGX2
Influenza A by PCR: NEGATIVE
Influenza B by PCR: NEGATIVE
Resp Syncytial Virus by PCR: NEGATIVE
SARS Coronavirus 2 by RT PCR: NEGATIVE

## 2023-11-12 LAB — LIPASE, BLOOD: Lipase: 26 U/L (ref 11–51)

## 2023-11-12 MED ORDER — SUCRALFATE 1 G PO TABS: 1.0000 g | ORAL_TABLET | Freq: Three times a day (TID) | ORAL | 0 refills | Status: AC

## 2023-11-12 MED ORDER — ONDANSETRON HCL 4 MG/2ML IJ SOLN
4.0000 mg | Freq: Once | INTRAMUSCULAR | Status: AC
  Administered 2023-11-12: 4 mg via INTRAVENOUS
  Filled 2023-11-12: qty 2

## 2023-11-12 MED ORDER — MORPHINE SULFATE (PF) 4 MG/ML IV SOLN
4.0000 mg | Freq: Once | INTRAVENOUS | Status: AC
  Administered 2023-11-12: 4 mg via INTRAVENOUS
  Filled 2023-11-12: qty 1

## 2023-11-12 MED ORDER — ALUM & MAG HYDROXIDE-SIMETH 200-200-20 MG/5ML PO SUSP
30.0000 mL | Freq: Once | ORAL | Status: AC
  Administered 2023-11-12: 30 mL via ORAL
  Filled 2023-11-12: qty 30

## 2023-11-12 NOTE — ED Provider Notes (Signed)
[place] [facility] AT [facility] Provider Note   CSN: [id number] Arrival date & time: 11/11/23  2343     History  Chief Complaint  Patient presents with   Chest Pain    Samantha [name] is a 67 y.o. female with a past medical history significant for type 2 diabetes, hypertension, frequent recurrent sinus infections, CKD, HLD, and eustachian tube dysfunction who presents to the ED due to multiple complaints of chest pain, shortness of breath, lower extremity edema, and cough.  Patient states chest pain started roughly 12 hours ago.  Chest pain is substernal.  Describes it as something "sitting on her chest".  She notes her chest pain has improved and is now migrated to her upper abdomen.  Patient has a cardiology appointment on 12/31.  Chest pain associated with shortness of breath.  Also endorses some orthopnea.  Admits to sleeping with 1 pillow.  Notes her lower extremities have been edematous for the past 4 days.  No history of CHF.  No history of blood clots. Also endorses a cough. Was recently diagnosed with sinusitis on 12/13 (hx of chronic sinusitis) following a URI.  Patient also endorses upper abdominal pain.  Notes that her chest pain has migrated to her upper abdomen.  Denies melena and hematochezia.  Endorses nausea however, no vomiting or diarrhea.  No fever or chills.  Denies any urinary symptoms.  Denies alcohol and chronic NSAID use.  History obtained from patient and past medical records. No interpreter used during encounter.       Home Medications Prior to Admission medications   Medication Sig Start Date End Date Taking? Authorizing Provider  sucralfate (CARAFATE) 1 g tablet Take 1 tablet (1 g total) by mouth 3 (three) times daily. 11/12/23  Yes Clester Chlebowski C, PA-C  albuterol (VENTOLIN HFA) 108 (90 Base) MCG/ACT inhaler Inhale 1-2 puffs into the lungs every 6 (six) hours as needed. 11/05/23   Nche, Bonna Gains, NP   amoxicillin-clavulanate (AUGMENTIN) 875-125 MG tablet Take 1 tablet by mouth 2 (two) times daily. 11/09/23   Saguier, Ramon Dredge, PA-C  azelastine (ASTELIN) 0.1 % nasal spray Place 1 spray into both nostrils daily as needed for allergies.    [provider]  benzonatate (TESSALON) 100 MG capsule Take 1-2 capsules (100-200 mg total) by mouth 3 (three) times daily as needed. 11/05/23   Nche, Bonna Gains, NP  Calcium Citrate-Vitamin D (CALCIUM + D PO) Take 1 tablet by mouth every morning.     [provider]  cetirizine (ZYRTEC) 10 MG tablet Take 10 mg by mouth daily.    [provider]  diclofenac (VOLTAREN) 75 MG EC tablet Take 1 tablet by mouth twice daily as needed 10/15/23   Saralyn Pilar A, PA  diclofenac Sodium (VOLTAREN) 1 % GEL Apply 2 g topically 4 (four) times daily. 07/13/21   Prosperi, Christian H, PA-C  FLUoxetine (PROZAC) 40 MG capsule Take 1 capsule (40 mg total) by mouth daily. 08/01/23   Melida Quitter, PA  fluticasone (FLONASE) 50 MCG/ACT nasal spray Place 2 sprays into both nostrils daily. 10/31/23   Loyola Mast, MD  gabapentin (NEURONTIN) 300 MG capsule TAKE 1 CAPSULE BY MOUTH IN THE MORNING AND 2 IN THE EVENING 10/31/23   Loyola Mast, MD  Galcanezumab-gnlm 120 MG/ML SOAJ Inject into the skin. 11/17/23 11/16/24  [provider]  hydrochlorothiazide (MICROZIDE) 12.5 MG capsule Take 1 capsule by mouth once daily 09/06/23   Melida Quitter, PA  Insulin Degludec FlexTouch 100 UNIT/ML SOPN INJECT 12 UNITS SUBCUTANEOUSLY ONCE DAILY Patient taking differently: Inject 14 Units into the skin at bedtime. INJECT 12 UNITS SUBCUTANEOUSLY ONCE DAILY 04/25/23   Saralyn Pilar A, PA  lovastatin (MEVACOR) 20 MG tablet Take 1 tablet (20 mg total) by mouth at bedtime. Patient taking differently: Take 20 mg by mouth daily. 08/01/23   Melida Quitter, PA  meloxicam (MOBIC) 15 MG tablet Take by mouth. 10/20/23   [provider]  metFORMIN  (GLUCOPHAGE) 1000 MG tablet TAKE 1 TABLET BY MOUTH TWICE DAILY WITH A MEAL 10/31/23   Loyola Mast, MD  nitroGLYCERIN (NITROSTAT) 0.4 MG SL tablet Place 1 tablet (0.4 mg total) under the tongue every 5 (five) minutes as needed for chest pain. 06/29/21   Nahser, Deloris Ping, MD  pantoprazole (PROTONIX) 40 MG tablet Take 1 tablet by mouth once daily Patient taking differently: Take 40 mg by mouth at bedtime. 10/16/22   Carlean Jews, NP  predniSONE (DELTASONE) 20 MG tablet Take 2 tablets (40 mg total) by mouth daily with breakfast. 11/05/23   Nche, Bonna Gains, NP  Semaglutide,0.25 or 0.5MG /DOS, (OZEMPIC, 0.25 OR 0.5 MG/DOSE,) 2 MG/3ML SOPN Inject 0.5 mg into the muscle once a week. 11/01/23   Loyola Mast, MD  SUMAtriptan (IMITREX) 50 MG tablet Take 50 mg by mouth. 10/18/23 10/17/24  [provider]  valsartan (DIOVAN) 160 MG tablet Take 1 tablet by mouth once daily 10/15/23   Saralyn Pilar A, PA  verapamil (CALAN-SR) 240 MG CR tablet Take 1 tablet (240 mg total) by mouth every morning. Patient taking differently: Take 240 mg by mouth at bedtime. 08/01/23   Melida Quitter, PA      Allergies    Iohexol, Iodinated contrast media, and Jardiance [empagliflozin]    Review of Systems   Review of Systems  Constitutional:  Negative for chills and fever.  Respiratory:  Positive for cough and shortness of breath.   Cardiovascular:  Positive for chest pain and leg swelling.  Gastrointestinal:  Positive for abdominal pain and nausea. Negative for diarrhea and vomiting.    Physical Exam Updated Vital Signs BP 133/70 (BP Location: Right Arm)   Pulse 62   Temp 98.4 F (36.9 C) (Oral)   Resp 17   Ht 5\' 10"  (1.778 m)   Wt 101.1 kg   SpO2 94%   BMI 31.98 kg/m  Physical Exam Vitals and nursing note reviewed.  Constitutional:      General: She is not in acute distress.    Appearance: She is not ill-appearing.  HENT:     Head: Normocephalic.  Eyes:     Pupils: Pupils are equal,  round, and reactive to light.  Cardiovascular:     Rate and Rhythm: Normal rate and regular rhythm.     Pulses: Normal pulses.     Heart sounds: Normal heart sounds. No murmur heard.    No friction rub. No gallop.  Pulmonary:     Effort: Pulmonary effort is normal.     Breath sounds: Normal breath sounds.  Abdominal:     General: Abdomen is flat. There is no distension.     Palpations: Abdomen is soft.     Tenderness: There is abdominal tenderness. There is no guarding or rebound.  Musculoskeletal:        General: Normal range of motion.     Cervical back: Neck supple.     Right lower leg: Edema present.  Left lower leg: Edema present.  Skin:    General: Skin is warm and dry.  Neurological:     General: No focal deficit present.     Mental Status: She is alert.  Psychiatric:        Mood and Affect: Mood normal.        Behavior: Behavior normal.     ED Results / Procedures / Treatments   Labs (all labs ordered are listed, but only abnormal results are displayed) Labs Reviewed  BASIC METABOLIC PANEL - Abnormal; Notable for the following components:      Result Value   Glucose, Bld 133 (*)    BUN 26 (*)    Creatinine, Ser 1.22 (*)    GFR, Estimated 48 (*)    All other components within normal limits  CBC - Abnormal; Notable for the following components:   WBC 12.4 (*)    Hemoglobin 11.3 (*)    HCT 35.7 (*)    All other components within normal limits  HEPATIC FUNCTION PANEL - Abnormal; Notable for the following components:   Albumin 3.1 (*)    All other components within normal limits  BRAIN NATRIURETIC PEPTIDE - Abnormal; Notable for the following components:   B Natriuretic Peptide 122.0 (*)    All other components within normal limits  RESP PANEL BY RT-PCR (RSV, FLU A&B, COVID)  [id number]  LIPASE, BLOOD  URINALYSIS, ROUTINE W REFLEX MICROSCOPIC  TROPONIN I (HIGH SENSITIVITY)  TROPONIN I (HIGH SENSITIVITY)    EKG EKG Interpretation Date/Time:  Sunday  November 11 2023 23:36:16 EST Ventricular Rate:  70 PR Interval:  116 QRS Duration:  144 QT Interval:  434 QTC Calculation: 468 R Axis:   -12  Text Interpretation: Normal sinus rhythm Left ventricular hypertrophy with QRS widening and repolarization abnormality ( R in aVL , Cornell product ) Abnormal ECG Confirmed by Tilden Fossa [id number]) on 11/12/2023 3:36:25 AM  Radiology CT ABDOMEN PELVIS WO CONTRAST Result Date: 11/12/2023 CLINICAL DATA:  Abdominal pain. EXAM: CT ABDOMEN AND PELVIS WITHOUT CONTRAST TECHNIQUE: Multidetector CT imaging of the abdomen and pelvis was performed following the standard protocol without IV contrast. RADIATION DOSE REDUCTION: This exam was performed according to the departmental dose-optimization program which includes automated exposure control, adjustment of the mA and/or kV according to patient size and/or use of iterative reconstruction technique. COMPARISON:  12/22/2022 FINDINGS: Lower chest: Asymmetric elevation left hemidiaphragm with left base atelectasis. Hepatobiliary: No suspicious focal abnormality in the liver on this study without intravenous contrast. There is no evidence for gallstones, gallbladder wall thickening, or pericholecystic fluid. No intrahepatic or extrahepatic biliary dilation. Pancreas: No focal mass lesion. No dilatation of the main duct. No intraparenchymal cyst. No peripancreatic edema. Spleen: No splenomegaly. No suspicious focal mass lesion. Adrenals/Urinary Tract: No adrenal nodule or mass. Kidneys unremarkable. No evidence for hydroureter. The urinary bladder appears normal for the degree of distention. Stomach/Bowel: Stomach is unremarkable. No gastric wall thickening. No evidence of outlet obstruction. The gastric wall in the region of the pylorus appears ill-defined in there may be some trace perigastric edema (axial image 29/3). Duodenum is normally positioned as is the ligament of Treitz. No small bowel wall thickening. No small  bowel dilatation. The terminal ileum is normal. The appendix is normal. No gross colonic mass. No colonic wall thickening. Diverticular changes are noted in the left colon without evidence of diverticulitis. Vascular/Lymphatic: There is mild atherosclerotic calcification of the abdominal aorta without aneurysm. Tiny peripancreatic lymph nodes are stable in  the interval. No retroperitoneal lymphadenopathy. No pelvic sidewall lymphadenopathy. Calcified lymph nodes are seen along the pelvic sidewall bilaterally, stable. Reproductive: The There is no adnexal mass. Other: No intraperitoneal free fluid. Musculoskeletal: No worrisome lytic or sclerotic osseous abnormality. Posterior broad-based disc osteophyte complex noted L2-3, L3-4. IMPRESSION: 1. The gastric wall in the region of the pylorus appears ill-defined and there may be some trace perigastric edema. While not definite, gastritis could have this appearance. Duodenitis and subtle pancreatitis also not excluded. 2. Left colonic diverticulosis without diverticulitis. 3. Asymmetric elevation left hemidiaphragm with left base atelectasis. 4.  Aortic Atherosclerosis (ICD10-I70.0). Electronically Signed   By: Kennith Center M.D.   On: 11/12/2023 14:51   VAS [place] LOWER EXTREMITY VENOUS (DVT) Result Date: 11/12/2023  Lower Venous DVT Study Patient Name:  BRYLEIGH BAUWENS  Date of Exam:   11/12/2023 Medical Rec #: [id number]             Accession #:    [id number] Date of Birth: December 01, 1955              Patient Gender: F Patient Age:   67 years Exam Location:  [facility] Procedure:      VAS [place] LOWER EXTREMITY VENOUS (DVT) Referring Phys: Claudette Stapler --------------------------------------------------------------------------------  Indications: Swelling, Edema, and Chest pain.  Comparison Study: No prior exam. Performing Technologist: Fernande Bras  Examination Guidelines: A complete evaluation includes B-mode imaging, spectral Doppler, color Doppler,  and power Doppler as needed of all accessible portions of each vessel. Bilateral testing is considered an integral part of a complete examination. Limited examinations for reoccurring indications may be performed as noted. The reflux portion of the exam is performed with the patient in reverse Trendelenburg.  +---------+---------------+---------+-----------+----------+--------------+ RIGHT    CompressibilityPhasicitySpontaneityPropertiesThrombus Aging +---------+---------------+---------+-----------+----------+--------------+ CFV      Full           Yes      Yes                                 +---------+---------------+---------+-----------+----------+--------------+ SFJ      Full           Yes      Yes                                 +---------+---------------+---------+-----------+----------+--------------+ FV Prox  Full                                                        +---------+---------------+---------+-----------+----------+--------------+ FV Mid   Full                                                        +---------+---------------+---------+-----------+----------+--------------+ FV DistalFull                                                        +---------+---------------+---------+-----------+----------+--------------+ PFV  Full                                                        +---------+---------------+---------+-----------+----------+--------------+ POP      Full           Yes      Yes                                 +---------+---------------+---------+-----------+----------+--------------+ PTV      Full                                                        +---------+---------------+---------+-----------+----------+--------------+ PERO     Full                                                        +---------+---------------+---------+-----------+----------+--------------+ Lymph node in right groin measuring 3.3 x  0.9 x 2.0 cm.  +---------+---------------+---------+-----------+----------+--------------+ LEFT     CompressibilityPhasicitySpontaneityPropertiesThrombus Aging +---------+---------------+---------+-----------+----------+--------------+ CFV      Full           Yes      Yes                                 +---------+---------------+---------+-----------+----------+--------------+ SFJ      Full           Yes      Yes                                 +---------+---------------+---------+-----------+----------+--------------+ FV Prox  Full                                                        +---------+---------------+---------+-----------+----------+--------------+ FV Mid   Full                                                        +---------+---------------+---------+-----------+----------+--------------+ FV DistalFull                                                        +---------+---------------+---------+-----------+----------+--------------+ PFV      Full                                                        +---------+---------------+---------+-----------+----------+--------------+  POP      Full           Yes      Yes                                 +---------+---------------+---------+-----------+----------+--------------+ PTV      Full                                                        +---------+---------------+---------+-----------+----------+--------------+ PERO     Full                                                        +---------+---------------+---------+-----------+----------+--------------+    Summary: BILATERAL: - No evidence of deep vein thrombosis seen in the lower extremities, bilaterally. -No evidence of popliteal cyst, bilaterally.   *See table(s) above for measurements and observations.    Preliminary    DG Chest 2 View Result Date: 11/12/2023 CLINICAL DATA:  Chest pain and cough EXAM: CHEST - 2 VIEW COMPARISON:   10/07/2023 FINDINGS: Stable cardiomediastinal silhouette. Elevated left hemidiaphragm. Left basilar atelectasis. Otherwise no focal consolidation, pleural effusion, or pneumothorax. No displaced rib fractures. IMPRESSION: No active cardiopulmonary disease. Electronically Signed   By: Minerva Fester M.D.   On: 11/12/2023 00:26    Procedures Procedures    Medications Ordered in ED Medications  ondansetron (ZOFRAN) injection 4 mg (4 mg Intravenous Given 11/12/23 0746)  morphine (PF) 4 MG/ML injection 4 mg (4 mg Intravenous Given 11/12/23 0747)  alum & mag hydroxide-simeth (MAALOX/MYLANTA) 200-200-20 MG/5ML suspension 30 mL (30 mLs Oral Given 11/12/23 0745)    ED Course/ Medical Decision Making/ A&P                                 Medical Decision Making Amount and/or Complexity of Data Reviewed Independent Historian: spouse External Data Reviewed: notes.    Details: PCP note Labs: ordered. Decision-making details documented in ED Course. Radiology: ordered. ECG/medicine tests: ordered and independent interpretation performed. Decision-making details documented in ED Course.  Risk OTC drugs. Prescription drug management.   This patient presents to the ED for concern of CP, SOB, abdominal pain, this involves an extensive number of treatment options, and is a complaint that carries with it a high risk of complications and morbidity.  The differential diagnosis includes ACS, PE, aortic dissection, pancreatitis, acute cholecystitis, etc  67 year old female presents to the ED due to multiple complaints of chest pain, shortness of breath, lower extremity edema, cough for the past few days.  No history of CHF or blood clots.  Chest pain has improved and is now migrated to her upper abdomen.  Upon arrival patient afebrile, not tachycardic or hypoxic.  Unfortunately patient waited over 7 hours prior to my initial evaluation due to long wait times.  Patient in no acute distress.  Abdomen soft,  nondistended with epigastric tenderness.  Routine labs ordered in triage.  Added hepatic function panel and lipase.  Also added BNP to rule out CHF. Lower extremity US to rule out  DVT. GI cocktail, Zofran, and morphine given.   CBC significant for leukocytosis at 12.4.  Hemoglobin 11.3.  Troponin x 2 normal. EKG NSR with LVH. Similar appearance from previous EKG. Low suspicion for ACS.  Chest x-ray personally reviewed and interpreted which is negative for signs of pneumonia, pneumothorax, or widened mediastinum.  BMP with elevated creatinine 1.2 and BUN at 26.  RVP negative.  UA negative.  Lipase normal.  Low suspicion for pancreatitis.  Hepatic function panel with normal LFTs.  BNP mildly elevated.  Chest x-ray with small left basilar atelectasis.  No other abnormalities.  Agree with radiology interpretation.  Ultrasound negative for DVT.  10:54 AM reassessed patient at bedside.  Patient still admits to some left-sided abdominal pain.  Patient has contrast allergy.  Will do CT abdomen without contrast.  Pain possibly related to GI etiology.  Patient notes improvement after GI cocktail.  Troponins normal.  Low suspicion for ACS.  Presentation nonconcerning for PE or aortic dissection.  Patient has a cardiology appointment scheduled for 12/31.  Advised patient to report to scheduled appointment for further evaluation.  Patient may need echocardiogram at the time to check EF.   CT abdomen personally reviewed and interpreted which demonstrates some perigastric edema which could represent gastritis versus duodenitis versus subtle pancreatitis.  Suspect symptoms likely related to gastritis given improvement after GI cocktail.  Lipase normal.  Lower suspicion for pancreatitis.  Patient able to tolerate p.o. at bedside without difficulty.  Patient discharged with Carafate.  GI referral given to patient. Advised patient to follow-up with PCP for recheck in 2-3 days. Patient stable for discharge. Strict ED  precautions discussed with patient. Patient states understanding and agrees to plan. Patient discharged home in no acute distress and stable vitals  Co morbidities that complicate the patient evaluation  DM2 Cardiac Monitoring: / EKG:  The patient was maintained on a cardiac monitor.  I personally viewed and interpreted the cardiac monitored which showed an underlying rhythm of: NSR with LVH  Social Determinants of Health:  Elderly age >78  Discussed with Dr. Theresia Lo who evaluated patient at bedside and agrees with assessment and plan.         Final Clinical Impression(s) / ED Diagnoses Final diagnoses:  Nonspecific chest pain  Abdominal pain, unspecified abdominal location    Rx / DC Orders ED Discharge Orders          Ordered    sucralfate (CARAFATE) 1 g tablet  3 times daily        11/12/23 1457              Mannie Stabile, PA-C 11/12/23 1507    Rexford Maus, DO 11/12/23 1510

## 2023-11-12 NOTE — Discharge Instructions (Addendum)
It was a pleasure taking care of you today.  As discussed, your workup was reassuring.  Your cardiac markers were normal.  Chest x-ray did not show evidence of pneumonia.  Please follow-up with PCP in 2 to 3 days for recheck.  Return to the ER for any worsening symptoms.  Your CT scan showed some inflammation of your stomach which is likely causing your symptoms. I am sending you home with medication. Take as needed for pain.

## 2023-11-14 ENCOUNTER — Encounter: Payer: Self-pay | Admitting: Optometry

## 2023-11-15 ENCOUNTER — Ambulatory Visit
Admission: RE | Admit: 2023-11-15 | Discharge: 2023-11-15 | Disposition: A | Discharge status: Home / Self Care | Payer: Medicare HMO | Source: Ambulatory Visit | Attending: Optometry | Admitting: Optometry

## 2023-11-15 DIAGNOSIS — H538 Other visual disturbances: Secondary | ICD-10-CM | POA: Diagnosis not present

## 2023-11-15 DIAGNOSIS — H532 Diplopia: Secondary | ICD-10-CM

## 2023-11-16 ENCOUNTER — Other Ambulatory Visit: Payer: Self-pay | Admitting: Family Medicine

## 2023-11-16 DIAGNOSIS — M1990 Unspecified osteoarthritis, unspecified site: Secondary | ICD-10-CM

## 2023-11-27 ENCOUNTER — Ambulatory Visit: Payer: Medicare HMO | Admitting: Nurse Practitioner

## 2023-12-05 DIAGNOSIS — M170 Bilateral primary osteoarthritis of knee: Secondary | ICD-10-CM | POA: Diagnosis not present

## 2023-12-21 DIAGNOSIS — H7201 Central perforation of tympanic membrane, right ear: Secondary | ICD-10-CM | POA: Diagnosis not present

## 2023-12-21 DIAGNOSIS — R519 Headache, unspecified: Secondary | ICD-10-CM | POA: Diagnosis not present

## 2023-12-21 DIAGNOSIS — H6983 Other specified disorders of Eustachian tube, bilateral: Secondary | ICD-10-CM | POA: Diagnosis not present

## 2023-12-24 ENCOUNTER — Other Ambulatory Visit: Payer: Self-pay | Admitting: Otolaryngology

## 2023-12-24 DIAGNOSIS — R519 Headache, unspecified: Secondary | ICD-10-CM

## 2023-12-31 ENCOUNTER — Other Ambulatory Visit: Payer: Self-pay | Admitting: Family Medicine

## 2023-12-31 DIAGNOSIS — M1990 Unspecified osteoarthritis, unspecified site: Secondary | ICD-10-CM

## 2024-01-03 ENCOUNTER — Encounter: Payer: Self-pay | Admitting: Family Medicine

## 2024-01-21 DIAGNOSIS — G43909 Migraine, unspecified, not intractable, without status migrainosus: Secondary | ICD-10-CM | POA: Diagnosis not present

## 2024-01-21 DIAGNOSIS — E1342 Other specified diabetes mellitus with diabetic polyneuropathy: Secondary | ICD-10-CM | POA: Diagnosis not present

## 2024-01-31 ENCOUNTER — Other Ambulatory Visit: Payer: Self-pay | Admitting: Family Medicine

## 2024-01-31 DIAGNOSIS — E785 Hyperlipidemia, unspecified: Secondary | ICD-10-CM

## 2024-02-04 ENCOUNTER — Ambulatory Visit (INDEPENDENT_AMBULATORY_CARE_PROVIDER_SITE_OTHER): Payer: Medicare HMO | Admitting: Family Medicine

## 2024-02-04 VITALS — BP 124/68 | HR 73 | Temp 97.6°F | Ht 70.0 in | Wt 220.0 lb

## 2024-02-04 DIAGNOSIS — G43009 Migraine without aura, not intractable, without status migrainosus: Secondary | ICD-10-CM | POA: Diagnosis not present

## 2024-02-04 DIAGNOSIS — G43909 Migraine, unspecified, not intractable, without status migrainosus: Secondary | ICD-10-CM | POA: Insufficient documentation

## 2024-02-04 DIAGNOSIS — Z7985 Long-term (current) use of injectable non-insulin antidiabetic drugs: Secondary | ICD-10-CM

## 2024-02-04 DIAGNOSIS — Z794 Long term (current) use of insulin: Secondary | ICD-10-CM

## 2024-02-04 DIAGNOSIS — I10 Essential (primary) hypertension: Secondary | ICD-10-CM

## 2024-02-04 DIAGNOSIS — E1140 Type 2 diabetes mellitus with diabetic neuropathy, unspecified: Secondary | ICD-10-CM | POA: Diagnosis not present

## 2024-02-04 DIAGNOSIS — E782 Mixed hyperlipidemia: Secondary | ICD-10-CM

## 2024-02-04 DIAGNOSIS — N1831 Chronic kidney disease, stage 3a: Secondary | ICD-10-CM

## 2024-02-04 DIAGNOSIS — Z7984 Long term (current) use of oral hypoglycemic drugs: Secondary | ICD-10-CM

## 2024-02-04 LAB — HEMOGLOBIN A1C: Hgb A1c MFr Bld: 7.5 % — ABNORMAL HIGH (ref 4.6–6.5)

## 2024-02-04 LAB — GLUCOSE, RANDOM: Glucose, Bld: 104 mg/dL — ABNORMAL HIGH (ref 70–99)

## 2024-02-04 NOTE — Assessment & Plan Note (Signed)
 I will check an A1c today. Continue metformin 1,000 mg bid, insulin degludec 14 units sq daily, and semaglutide (Ozempic) 0.5 mg weekly.

## 2024-02-04 NOTE — Assessment & Plan Note (Signed)
 Lipids at goal. Continue lovastatin 20 mg daily.

## 2024-02-04 NOTE — Assessment & Plan Note (Signed)
 Continue focus on blood pressure and glucose control, adequate hydration, and avoidance of nephrotoxic medications.

## 2024-02-04 NOTE — Assessment & Plan Note (Signed)
 Stable. Continue Emgality and sumatriptan.

## 2024-02-04 NOTE — Progress Notes (Signed)
 [facility] PRIMARY CARE [facility] [address] [place] [address] Dept: [phone] Dept Fax: [phone]  Chronic Care Office Visit  Subjective:    Patient ID: Samantha [name], female    DOB: [birth year], 68 y.o..   MRN: [id number]  Chief Complaint  Patient presents with   Hypertension    3 month f/u HTN.  C/o having pain in her hip and ankle swelling.    History of Present Illness:  Patient is in today for reassessment of chronic medical issues.  Samantha [name] has Type 2 diabetes diagnosed in 2016. This is complicated with microalbuminuria and neuropathy. She is managed on metformin 1,000 mg bid, insulin degludec 14 units sq daily, and semaglutide (Ozempic) 0.5 mg weekly. Her neuropathy is managed with gabapentin 300 mg 1 cap each morning and 2 caps each evening.   Samantha [name] has hypertension. She is managed on HCTZ 12.5 mg daily, valsartan 160 mg daily, and verapamil 240 mg daily.   Samantha [name] has hyperlipidemia. She is managed on lovastatin 20 mg daily.  Samantha [name] has a history of migraine headaches. She was seen by Mr. Jon Gills (neurology-NP) recently. She notes she is taking sumatriptan for acute migraine management and this seems to be helping well.  Past Medical History: Patient Active Problem List   Diagnosis Date Noted   Stage 3a chronic kidney disease (CKD) (HCC) 11/01/2023   Type 2 diabetes mellitus with diabetic chronic kidney disease (HCC) 11/01/2023   Diabetic neuropathy (HCC) 10/31/2023   Allergic rhinitis 10/31/2023   Diverticulosis 10/23/2023   Syncope 10/07/2023   Neuroma of foot 02/20/2022   Unstable angina (HCC)    Arthritis 01/14/2021   History of colonic polyps 01/14/2021   Hyperlipidemia 02/23/2020   Microalbuminuria due to type 2 diabetes mellitus (HCC) 02/23/2020   B12 deficiency 02/23/2020   Vitamin D deficiency 02/23/2020   Generalized anxiety disorder 02/23/2020   Degenerative disc disease,  lumbar 07/19/2018   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   Spondylosis of lumbar spine 07/19/2018   Lumbar radiculopathy 07/05/2018   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/21/2018   Eustachian tube dysfunction, bilateral 02/21/2018   Recurrent sinusitis 02/21/2018   GERD (gastroesophageal reflux disease) 10/24/2017   Pulmonary nodule, right 08/14/2017   Obesity (BMI 30-39.9) 06/11/2017   LBBB (left bundle branch block) 08/10/2016   Leukocytosis 08/10/2016   Essential hypertension 08/10/2016   Type 2 diabetes mellitus with diabetic neuropathy, with long-term current use of insulin (HCC) 11/20/2015   Past Surgical History:  Procedure Laterality Date   CATARACT EXTRACTION W/PHACO Left 07/11/2022   Procedure: CATARACT EXTRACTION PHACO AND INTRAOCULAR LENS PLACEMENT (IOC) LEFT  DIABETIC;  Surgeon: Galen Manila, MD;  Location: [facility];  Service: Ophthalmology;  Laterality: Left;  3.60 0:22.7   CATARACT EXTRACTION W/PHACO Right 07/25/2022   Procedure: CATARACT EXTRACTION PHACO AND INTRAOCULAR LENS PLACEMENT (IOC) RIGHT DIABETIC 5.12 00:33.0;  Surgeon: Galen Manila, MD;  Location: [facility];  Service: Ophthalmology;  Laterality: Right;   KNEE ARTHROSCOPY Right 2017   LEFT HEART CATH AND CORONARY ANGIOGRAPHY N/A 06/30/2021   Procedure: LEFT HEART CATH AND CORONARY ANGIOGRAPHY;  Surgeon: Lennette Bihari, MD;  Location: [facility];  Service: Cardiovascular;  Laterality: N/A;   MIDDLE EAR SURGERY Right    TONSILLECTOMY AND ADENOIDECTOMY  1962   Family History  Problem Relation Age of Onset   Diabetes Mother    Hypertension Mother    CAD  Mother    Hypertension Father    Diabetes Father    CAD Father    Diabetes Sister    Hypertension Sister    Diabetes Brother    Hyperlipidemia Brother    Hypertension Brother    CAD Brother    Cancer Paternal Uncle        Brain   Cancer Paternal Grandmother         Colon   Kidney disease Paternal Grandfather    Outpatient Medications Prior to Visit  Medication Sig Dispense Refill   albuterol (VENTOLIN HFA) 108 (90 Base) MCG/ACT inhaler Inhale 1-2 puffs into the lungs every 6 (six) hours as needed. 8 g 0   azelastine (ASTELIN) 0.1 % nasal spray Place 1 spray into both nostrils daily as needed for allergies.     Calcium Citrate-Vitamin D (CALCIUM + D PO) Take 1 tablet by mouth every morning.      cetirizine (ZYRTEC) 10 MG tablet Take 10 mg by mouth daily.     diclofenac (VOLTAREN) 75 MG EC tablet Take 1 tablet by mouth twice daily as needed 60 tablet 2   diclofenac Sodium (VOLTAREN) 1 % GEL Apply 2 g topically 4 (four) times daily. 100 g 3   FLUoxetine (PROZAC) 40 MG capsule Take 1 capsule (40 mg total) by mouth daily. 90 capsule 1   fluticasone (FLONASE) 50 MCG/ACT nasal spray Place 2 sprays into both nostrils daily. 16 g 6   gabapentin (NEURONTIN) 300 MG capsule TAKE 1 CAPSULE BY MOUTH IN THE MORNING AND 2 IN THE EVENING 270 capsule 3   Galcanezumab-gnlm 120 MG/ML SOAJ Inject into the skin.     hydrochlorothiazide (MICROZIDE) 12.5 MG capsule Take 1 capsule by mouth once daily 90 capsule 0   Insulin Degludec FlexTouch 100 UNIT/ML SOPN INJECT 12 UNITS SUBCUTANEOUSLY ONCE DAILY (Patient taking differently: Inject 14 Units into the skin at bedtime. INJECT 12 UNITS SUBCUTANEOUSLY ONCE DAILY) 15 mL 0   lovastatin (MEVACOR) 20 MG tablet Take 1 tablet (20 mg total) by mouth daily. 90 tablet 1   meloxicam (MOBIC) 15 MG tablet Take by mouth.     metFORMIN (GLUCOPHAGE) 1000 MG tablet TAKE 1 TABLET BY MOUTH TWICE DAILY WITH A MEAL 180 tablet 3   nitroGLYCERIN (NITROSTAT) 0.4 MG SL tablet Place 1 tablet (0.4 mg total) under the tongue every 5 (five) minutes as needed for chest pain. 30 tablet 12   pantoprazole (PROTONIX) 40 MG tablet Take 1 tablet by mouth once daily (Patient taking differently: Take 40 mg by mouth at bedtime.) 90 tablet 0   Semaglutide,0.25 or  0.5MG /DOS, (OZEMPIC, 0.25 OR 0.5 MG/DOSE,) 2 MG/3ML SOPN Inject 0.5 mg into the muscle once a week. 3 mL 5   sucralfate (CARAFATE) 1 g tablet Take 1 tablet (1 g total) by mouth 3 (three) times daily. 90 tablet 0   SUMAtriptan (IMITREX) 50 MG tablet Take 50 mg by mouth.     valsartan (DIOVAN) 160 MG tablet Take 1 tablet by mouth once daily 90 tablet 0   verapamil (CALAN-SR) 240 MG CR tablet Take 1 tablet (240 mg total) by mouth every morning. (Patient taking differently: Take 240 mg by mouth at bedtime.) 90 tablet 0   amoxicillin-clavulanate (AUGMENTIN) 875-125 MG tablet Take 1 tablet by mouth 2 (two) times daily. 20 tablet 0   benzonatate (TESSALON) 100 MG capsule Take 1-2 capsules (100-200 mg total) by mouth 3 (three) times daily as needed. 21 capsule 0   predniSONE (DELTASONE) 20  MG tablet Take 2 tablets (40 mg total) by mouth daily with breakfast. 6 tablet 0   No facility-administered medications prior to visit.   Allergies  Allergen Reactions   Iohexol Anaphylaxis     Code: SOB, Desc: respiratory distress-ASM, Onset Date: [id number]    Iodinated Contrast Media Other (See Comments)   Jardiance [Empagliflozin] Rash   Objective:   Today's Vitals   02/04/24 1354  BP: 124/68  Pulse: 73  Temp: 97.6 F (36.4 C)  TempSrc: Temporal  SpO2: 96%  Weight: 220 lb (99.8 kg)  Height: 5\' 10"  (1.778 m)   Body mass index is 31.57 kg/m.   General: Well developed, well nourished. No acute distress. Extremities: There is a trace-1+ edema. There are multiple spider varicosities and several typcial varicose veins. Psych: Alert and oriented. Normal mood and affect.  Health Maintenance Due  Topic Date Due   COVID-19 Vaccine (5 - 2024-25 season) 07/29/2023   Medicare Annual Wellness (AWV)  02/26/2024     Assessment & Plan:   Problem List Items Addressed This Visit       Cardiovascular and Mediastinum   Essential hypertension - Primary   Blood pressure is in good control. Continue HCTZ 12.5  mg daily, valsartan 160 mg daily, and verapamil 240 mg daily.      Migraine headache   Stable. Continue Emgality and sumatriptan.        Endocrine   Type 2 diabetes mellitus with diabetic neuropathy, with long-term current use of insulin (HCC) (Chronic)   I will check an A1c today. Continue metformin 1,000 mg bid, insulin degludec 14 units sq daily, and semaglutide (Ozempic) 0.5 mg weekly.      Relevant Orders   Glucose, random   Hemoglobin A1c     Genitourinary   Stage 3a chronic kidney disease (CKD) (HCC)   Continue focus on blood pressure and glucose control, adequate hydration, and avoidance of nephrotoxic medications.         Other   Hyperlipidemia   Lipids at goal. Continue lovastatin 20 mg daily.       Return in about 3 months (around 05/06/2024) for Reassessment.   Loyola Mast, MD

## 2024-02-04 NOTE — Assessment & Plan Note (Signed)
 Blood pressure is in good control. Continue HCTZ 12.5 mg daily, valsartan 160 mg daily, and verapamil 240 mg daily.

## 2024-02-04 NOTE — Patient Instructions (Signed)
 Take a multivitamin, such as Centrum Silver  Chronic Venous Insufficiency Chronic venous insufficiency is a condition that causes the veins in the legs to struggle to pump blood from the legs to the heart. It is also called venous stasis. This condition can happen when the vein walls are stretched, weakened, or damaged. It can also happen when the valves inside the vein are damaged. With the right treatment, you should be able to still lead an active life. What are the causes? Common causes of this condition include: Venous hypertension. This is high blood pressure inside the veins. Sitting or standing too long. This can cause increased blood pressure in the veins of the leg. Deep vein thrombosis (DVT). This is a blood clot that blocks blood flow in a vein. Phlebitis. This is inflammation of a vein. It can cause a blood clot to form. An abnormal growth of cells (tumor) in the area between your hip bones (pelvis). This can cause blood to back up. What increases the risk? Factors that may make you more likely to get this condition include: Having a family history of the condition. Being overweight. Being pregnant. Not getting enough exercise. Smoking. Having a job that requires you to sit or stand in one place for a long time. Being a certain age. Females in their 61s and 66s and males in their 52s are more likely to get this condition. What are the signs or symptoms? Symptoms of this condition include: Varicose veins. These are veins that are enlarged, bulging, or twisted. Skin breakdown or ulcers. Reddened skin or dark discoloration of the skin on the leg between the knee and ankle. Lipodermatosclerosis. This is brown, smooth, tight, and painful skin just above the ankle. It is often on the inside of the leg. Swelling of the legs. How is this diagnosed? This condition may be diagnosed based on your medical history and a physical exam. You may also need tests, such as: A duplex  ultrasound. This shows how blood flows through a blood vessel. Plethysmography. This tests blood flow. Venogram. This looks at the veins using an X-ray and dye. How is this treated? The goals of treatment are to help you return to an active life and to relieve pain. Treatment may include: Wearing compression stockings. These do not cure the condition but can help relieve symptoms. They can also help stop your condition from getting worse. Sclerotherapy. This involves injecting a solution to shrink damaged veins. Surgery. This may include: Vein stripping. This is when a diseased vein is taken out. Laser ablation surgery. This is when blood flow is cut off through the vein. Repairing or remaking a valve inside the affected vein. Follow these instructions at home: Lifestyle Do not use any products that contain nicotine or tobacco. These products include cigarettes, chewing tobacco, and vaping devices, such as e-cigarettes. If you need help quitting, ask your health care provider. Stay active. Exercise, walk, or do other activities. Ask your provider what activities are safe for you. General instructions Take over-the-counter and prescription medicines only as told by your provider. Drink enough fluid to keep your pee (urine) pale yellow. Wear compression stockings as told by your provider. These stockings help to prevent blood clots and reduce swelling in your legs. Keep all follow-up visits. Your provider will check your legs for any changes and adjust your treatment plan as needed. Contact a health care provider if: You have redness, swelling, or more pain in the affected area. You see a red streak or  line that goes up or down from the area. You have skin breakdown or skin loss. You get an injury in the affected area. You get a fever. Get help right away if: You have severe pain that does not get better with medicine. You get an injury and an open wound in the affected area. Your foot or  ankle becomes numb or weak all of a sudden. You have trouble moving your foot or ankle. Your symptoms do not go away or get worse. You have chest pain. You have shortness of breath. These symptoms may be an emergency. Get help right away. Call 911. Do not wait to see if the symptoms will go away. Do not drive yourself to the hospital. This information is not intended to replace advice given to you by your health care provider. Make sure you discuss any questions you have with your health care provider. Document Revised: 11/28/2022 Document Reviewed: 11/28/2022 Elsevier Patient Education  2024 [employer].

## 2024-02-05 ENCOUNTER — Encounter: Payer: Self-pay | Admitting: Family Medicine

## 2024-02-05 MED ORDER — SEMAGLUTIDE (1 MG/DOSE) 4 MG/3ML ~~LOC~~ SOPN: 1.0000 mg | PEN_INJECTOR | SUBCUTANEOUS | 3 refills | Status: DC

## 2024-02-05 NOTE — Addendum Note (Signed)
 Addended by: Loyola Mast on: 02/05/2024 08:04 AM   Modules accepted: Orders

## 2024-02-18 DIAGNOSIS — M170 Bilateral primary osteoarthritis of knee: Secondary | ICD-10-CM | POA: Diagnosis not present

## 2024-02-18 DIAGNOSIS — M545 Low back pain, unspecified: Secondary | ICD-10-CM | POA: Diagnosis not present

## 2024-02-21 ENCOUNTER — Other Ambulatory Visit: Payer: Self-pay | Admitting: Family Medicine

## 2024-02-21 DIAGNOSIS — E1159 Type 2 diabetes mellitus with other circulatory complications: Secondary | ICD-10-CM

## 2024-02-24 ENCOUNTER — Other Ambulatory Visit: Payer: Self-pay | Admitting: Family Medicine

## 2024-02-24 DIAGNOSIS — I152 Hypertension secondary to endocrine disorders: Secondary | ICD-10-CM

## 2024-02-25 DIAGNOSIS — H5713 Ocular pain, bilateral: Secondary | ICD-10-CM | POA: Diagnosis not present

## 2024-02-25 DIAGNOSIS — E119 Type 2 diabetes mellitus without complications: Secondary | ICD-10-CM | POA: Diagnosis not present

## 2024-02-25 DIAGNOSIS — Z961 Presence of intraocular lens: Secondary | ICD-10-CM | POA: Diagnosis not present

## 2024-02-25 DIAGNOSIS — H04123 Dry eye syndrome of bilateral lacrimal glands: Secondary | ICD-10-CM | POA: Diagnosis not present

## 2024-02-25 DIAGNOSIS — H40013 Open angle with borderline findings, low risk, bilateral: Secondary | ICD-10-CM | POA: Diagnosis not present

## 2024-02-25 LAB — HM DIABETES EYE EXAM

## 2024-02-27 ENCOUNTER — Encounter: Payer: Self-pay | Admitting: Family Medicine

## 2024-02-28 ENCOUNTER — Other Ambulatory Visit: Payer: Self-pay | Admitting: Family Medicine

## 2024-02-28 DIAGNOSIS — I152 Hypertension secondary to endocrine disorders: Secondary | ICD-10-CM

## 2024-02-28 NOTE — Telephone Encounter (Signed)
 Copied from CRM [id number]. Topic: Clinical - Medication Refill >> Feb 28, 2024  2:18 PM Elizebeth Brooking wrote: Most Recent Primary Care Visit:  Provider: Loyola Mast  Department: [facility]  Visit Type: OFFICE VISIT  Date: 02/04/2024  Medication: verapamil (CALAN-SR) 240 MG CR tablet  Has the patient contacted their pharmacy? Yes (Agent: If no, request that the patient contact the pharmacy for the refill. If patient does not wish to contact the pharmacy document the reason why and proceed with request.) (Agent: If yes, when and what did the pharmacy advise?)  Is this the correct pharmacy for this prescription? Yes If no, delete pharmacy and type the correct one.  This is the patient's preferred pharmacy:  [facility] [address], [place] - [address] [place] [address] Phone: [phone] Fax: [phone]  [facility] [address] [place]), [place] - [address] [place]) [place] [address] Phone: [phone] Fax: [phone]   Has the prescription been filled recently? No  Is the patient out of the medication? Yes  Has the patient been seen for an appointment in the last year OR does the patient have an upcoming appointment? Yes  Can we respond through MyChart? Yes  Agent: Please be advised that Rx refills may take up to 3 business days. We ask that you follow-up with your pharmacy.

## 2024-03-05 ENCOUNTER — Other Ambulatory Visit: Payer: Self-pay

## 2024-03-05 DIAGNOSIS — I152 Hypertension secondary to endocrine disorders: Secondary | ICD-10-CM

## 2024-03-05 DIAGNOSIS — M170 Bilateral primary osteoarthritis of knee: Secondary | ICD-10-CM | POA: Diagnosis not present

## 2024-03-05 MED ORDER — VERAPAMIL HCL ER 240 MG PO TBCR: 240.0000 mg | EXTENDED_RELEASE_TABLET | Freq: Every day | ORAL | 3 refills | Status: AC

## 2024-03-05 MED ORDER — HYDROCHLOROTHIAZIDE 12.5 MG PO CAPS: 12.5000 mg | ORAL_CAPSULE | Freq: Every day | ORAL | 3 refills | Status: AC

## 2024-03-05 MED ORDER — VALSARTAN 160 MG PO TABS: 160.0000 mg | ORAL_TABLET | Freq: Every day | ORAL | 3 refills | Status: AC

## 2024-03-05 NOTE — Telephone Encounter (Signed)
 Copied from CRM [id number]. Topic: Clinical - Prescription Issue >> Mar 05, 2024  9:38 AM Pascal Lux wrote: Reason for CRM: Patient stated that she's been out of her medication: verapamil (CALAN-SR) 240 MG CR tablet and still was not sent to pharmacy.

## 2024-03-05 NOTE — Telephone Encounter (Signed)
 Refill request for  Verapamil 240 mg Valsartan 160 mg Hydrochlorothiazide 12.5 mg  LR HX provider LOV 02/04/24 FOV  05/11/24  Please review and advise.  Thanks  Dm/cma\

## 2024-03-21 ENCOUNTER — Ambulatory Visit: Payer: Medicare HMO

## 2024-03-21 DIAGNOSIS — Z0000 Encounter for general adult medical examination without abnormal findings: Secondary | ICD-10-CM | POA: Diagnosis not present

## 2024-03-21 NOTE — Progress Notes (Signed)
 Subjective:   Samantha [name] is a 68 y.o. who presents for a Medicare Wellness preventive visit.  Visit Complete: Virtual I connected with  Samantha [name] on 03/21/24 by a audio enabled telemedicine application and verified that I am speaking with the correct person using two identifiers.  Patient Location: Home  Provider Location: Office/Clinic  I discussed the limitations of evaluation and management by telemedicine. The patient expressed understanding and agreed to proceed.  Vital Signs: Because this visit was a virtual/telehealth visit, some criteria may be missing or patient reported. Any vitals not documented were not able to be obtained and vitals that have been documented are patient reported.  VideoError- Librarian, academic were attempted between this provider and patient, however failed, due to patient having technical difficulties OR patient did not have access to video capability.  We continued and completed visit with audio only.   Persons Participating in Visit: Patient.  AWV Questionnaire: No: Patient Medicare AWV questionnaire was not completed prior to this visit.  Cardiac Risk Factors include: advanced age (>24men, >72 women);diabetes mellitus;dyslipidemia;hypertension     Objective:    Today's Vitals   03/21/24 0846  PainSc: 3    There is no height or weight on file to calculate BMI.     03/21/2024    8:55 AM 11/11/2023   11:55 PM 10/07/2023    3:39 PM 02/26/2023    4:11 PM 12/22/2022   12:08 PM 12/17/2022   10:39 AM 07/25/2022    6:30 AM  Advanced Directives  Does Patient Have a Medical Advance Directive? Yes No No No No No No  Type of Estate agent of [place];Living will        Copy of Healthcare Power of Attorney in Chart? No - copy requested        Would patient like information on creating a medical advance directive?   No - Patient declined No - Patient declined No - Patient declined  No -  Patient declined    Current Medications (verified) Outpatient Encounter Medications as of 03/21/2024  Medication Sig   albuterol  (VENTOLIN  HFA) 108 (90 Base) MCG/ACT inhaler Inhale 1-2 puffs into the lungs every 6 (six) hours as needed.   azelastine (ASTELIN) 0.1 % nasal spray Place 1 spray into both nostrils daily as needed for allergies.   Calcium Citrate-Vitamin D  (CALCIUM + D PO) Take 1 tablet by mouth every morning.    cetirizine (ZYRTEC) 10 MG tablet Take 10 mg by mouth daily.   diclofenac  (VOLTAREN ) 75 MG EC tablet Take 1 tablet by mouth twice daily as needed   diclofenac  Sodium (VOLTAREN ) 1 % GEL Apply 2 g topically 4 (four) times daily.   FLUoxetine  (PROZAC ) 40 MG capsule Take 1 capsule (40 mg total) by mouth daily.   fluticasone  (FLONASE ) 50 MCG/ACT nasal spray Place 2 sprays into both nostrils daily.   gabapentin  (NEURONTIN ) 300 MG capsule TAKE 1 CAPSULE BY MOUTH IN THE MORNING AND 2 IN THE EVENING   hydrochlorothiazide  (MICROZIDE ) 12.5 MG capsule Take 1 capsule (12.5 mg total) by mouth daily.   Insulin  Degludec FlexTouch 100 UNIT/ML SOPN INJECT 12 UNITS SUBCUTANEOUSLY ONCE DAILY (Patient taking differently: Inject 14 Units into the skin at bedtime. INJECT 12 UNITS SUBCUTANEOUSLY ONCE DAILY)   lovastatin  (MEVACOR ) 20 MG tablet Take 1 tablet (20 mg total) by mouth daily.   meloxicam  (MOBIC ) 15 MG tablet Take by mouth.   metFORMIN  (GLUCOPHAGE ) 1000 MG tablet TAKE 1 TABLET BY  MOUTH TWICE DAILY WITH A MEAL   nitroGLYCERIN  (NITROSTAT ) 0.4 MG SL tablet Place 1 tablet (0.4 mg total) under the tongue every 5 (five) minutes as needed for chest pain.   pantoprazole  (PROTONIX ) 40 MG tablet Take 1 tablet by mouth once daily (Patient taking differently: Take 40 mg by mouth at bedtime.)   Semaglutide , 1 MG/DOSE, 4 MG/3ML SOPN Inject 1 mg as directed once a week.   sucralfate  (CARAFATE ) 1 g tablet Take 1 tablet (1 g total) by mouth 3 (three) times daily.   SUMAtriptan (IMITREX) 50 MG tablet Take  50 mg by mouth.   valsartan  (DIOVAN ) 160 MG tablet Take 1 tablet (160 mg total) by mouth daily.   verapamil  (CALAN -SR) 240 MG CR tablet Take 1 tablet (240 mg total) by mouth daily.   Galcanezumab-gnlm 120 MG/ML SOAJ Inject into the skin. (Patient not taking: Reported on 03/21/2024)   No facility-administered encounter medications on file as of 03/21/2024.    Allergies (verified) Iohexol , Iodinated contrast media, and Jardiance [empagliflozin]   History: Past Medical History:  Diagnosis Date   Arthritis    Dyslipidemia    Heart disease    Heart murmur 2011   Left bundle branch block 2011   Type II or unspecified type diabetes mellitus without mention of complication, not stated as uncontrolled    Unspecified essential hypertension    Past Surgical History:  Procedure Laterality Date   CATARACT EXTRACTION W/PHACO Left 07/11/2022   Procedure: CATARACT EXTRACTION PHACO AND INTRAOCULAR LENS PLACEMENT (IOC) LEFT  DIABETIC;  Surgeon: Clair Crews, MD;  Location: [facility];  Service: Ophthalmology;  Laterality: Left;  3.60 0:22.7   CATARACT EXTRACTION W/PHACO Right 07/25/2022   Procedure: CATARACT EXTRACTION PHACO AND INTRAOCULAR LENS PLACEMENT (IOC) RIGHT DIABETIC 5.12 00:33.0;  Surgeon: Clair Crews, MD;  Location: [facility];  Service: Ophthalmology;  Laterality: Right;   KNEE ARTHROSCOPY Right 2017   LEFT HEART CATH AND CORONARY ANGIOGRAPHY N/A 06/30/2021   Procedure: LEFT HEART CATH AND CORONARY ANGIOGRAPHY;  Surgeon: Millicent Ally, MD;  Location: [facility];  Service: Cardiovascular;  Laterality: N/A;   MIDDLE EAR SURGERY Right    TONSILLECTOMY AND ADENOIDECTOMY  1962   Family History  Problem Relation Age of Onset   Diabetes Mother    Hypertension Mother    CAD Mother    Hypertension Father    Diabetes Father    CAD Father    Diabetes Sister    Hypertension Sister    Diabetes Brother    Hyperlipidemia Brother    Hypertension Brother     CAD Brother    Cancer Paternal Uncle        Brain   Cancer Paternal Grandmother        Colon   Kidney disease Paternal Grandfather    Social History   Socioeconomic History   Marital status: Married    Spouse name: Not on file   Number of children: 1   Years of education: Not on file   Highest education level: Associate degree: academic program  Occupational History   Not on file  Tobacco Use   Smoking status: Never    Passive exposure: Never   Smokeless tobacco: Never  Vaping Use   Vaping status: Never Used  Substance and Sexual Activity   Alcohol use: No   Drug use: No   Sexual activity: Not Currently  Other Topics Concern   Not on file  Social History Narrative   Not on  file   Social Drivers of Health   Financial Resource Strain: Low Risk  (03/21/2024)   Overall Financial Resource Strain (CARDIA)    Difficulty of Paying Living Expenses: Not hard at all  Food Insecurity: No Food Insecurity (03/21/2024)   Hunger Vital Sign    Worried About Running Out of Food in the Last Year: Never true    Ran Out of Food in the Last Year: Never true  Transportation Needs: No Transportation Needs (03/21/2024)   PRAPARE - Administrator, Civil Service (Medical): No    Lack of Transportation (Non-Medical): No  Physical Activity: Sufficiently Active (03/21/2024)   Exercise Vital Sign    Days of Exercise per Week: 7 days    Minutes of Exercise per Session: 30 min  Stress: No Stress Concern Present (03/21/2024)   [employer] of Occupational Health - Occupational Stress Questionnaire    Feeling of Stress : Not at all  Social Connections: Moderately Integrated (03/21/2024)   Social Connection and Isolation Panel [NHANES]    Frequency of Communication with Friends and Family: More than three times a week    Frequency of Social Gatherings with Friends and Family: Three times a week    Attends Religious Services: 1 to 4 times per year    Active Member of Clubs or  Organizations: No    Attends Banker Meetings: Never    Marital Status: Married    Tobacco Counseling Counseling given: Not Answered    Clinical Intake:  Pre-visit preparation completed: Yes  Pain : 0-10 Pain Score: 3  Pain Type: Chronic pain Pain Location: Hip Pain Orientation: Right, Left Pain Descriptors / Indicators: Aching Pain Onset: More than a month ago Pain Frequency: Constant     Nutritional Risks: None Diabetes: Yes CBG done?: No Did pt. bring in CBG monitor from home?: No  Lab Results  Component Value Date   HGBA1C 7.5 (H) 02/04/2024   HGBA1C 7.1 (H) 10/31/2023   HGBA1C 7.1 (H) 07/25/2023     How often do you need to have someone help you when you read instructions, pamphlets, or other written materials from your doctor or pharmacy?: 1 - Never  Interpreter Needed?: No  Information entered by :: NAllen LPN   Activities of Daily Living     03/21/2024    8:48 AM 10/08/2023    6:22 AM  In your present state of health, do you have any difficulty performing the following activities:  Hearing? 0   Vision? 0   Difficulty concentrating or making decisions? 0   Walking or climbing stairs? 1   Comment some   Dressing or bathing? 0   Doing errands, shopping? 0 0  Preparing Food and eating ? N   Using the Toilet? N   In the past six months, have you accidently leaked urine? N   Do you have problems with loss of bowel control? N   Managing your Medications? N   Managing your Finances? N   Housekeeping or managing your Housekeeping? N     Patient Care Team: Graig Lawyer, MD as PCP - General (Family Medicine) Baldwin Levee Delora Ferry, MD as Consulting Physician (Pulmonary Disease) [facility] Alvis Jourdain, MD as Consulting Physician (Gastroenterology) [facility], [facility] (Optometry) [facility] Von Grumbling, MD as Referring Physician (Otolaryngology) Rosan Comfort, MD as Consulting  Physician (Neurology) Nahser, Lela Purple, MD as Consulting Physician (Cardiology)  Indicate any recent Medical Services you may have  received from other than [facility] providers in the past year (date may be approximate).     Assessment:   This is a routine wellness examination for [place].  Hearing/Vision screen Hearing Screening - Comments:: Denies hearing issues Vision Screening - Comments:: Regular eye exams, [facility]   Goals Addressed             This Visit's Progress    Patient Stated       03/21/2024, denies goals       Depression Screen     03/21/2024    8:57 AM 10/31/2023    1:53 PM 08/01/2023    2:26 PM 04/25/2023    2:27 PM 02/26/2023    4:11 PM 01/25/2023    9:07 AM 12/13/2022   10:04 AM  PHQ 2/9 Scores  PHQ - 2 Score 0 1 5 0 1 1 0  PHQ- 9 Score 0 3 20 5 5 5      Fall Risk     03/21/2024    8:56 AM 11/05/2023   10:32 AM 10/31/2023    1:53 PM 08/01/2023    2:26 PM 02/26/2023    4:07 PM  Fall Risk   Falls in the past year? 0 0 1 1 1   Number falls in past yr: 0 0 1 0   Injury with Fall? 0 0 1 0 0  Risk for fall due to : Medication side effect No Fall Risks History of fall(s) Impaired balance/gait Impaired balance/gait;Impaired mobility  Follow up Falls prevention discussed;Falls evaluation completed Falls evaluation completed Falls evaluation completed Falls evaluation completed     MEDICARE RISK AT HOME:  Medicare Risk at Home Any stairs in or around the home?: No If so, are there any without handrails?: No Home free of loose throw rugs in walkways, pet beds, electrical cords, etc?: Yes Adequate lighting in your home to reduce risk of falls?: Yes Life alert?: No Use of a cane, walker or w/c?: No Grab bars in the bathroom?: No Shower chair or bench in shower?: No Elevated toilet seat or a handicapped toilet?: Yes  TIMED UP AND GO:  Was the test performed?  No  Cognitive Function: 6CIT completed        03/21/2024    8:57 AM 02/26/2023    4:07 PM 02/14/2022    11:17 AM  6CIT Screen  What Year? 0 points 0 points 0 points  What month? 0 points 0 points 0 points  What time? 0 points 0 points 0 points  Count back from 20 0 points 0 points 0 points  Months in reverse 0 points 0 points 0 points  Repeat phrase 0 points 0 points 0 points  Total Score 0 points 0 points 0 points    Immunizations Immunization History  Administered Date(s) Administered   Fluad Quad(high Dose 65+) 09/13/2020, 09/26/2021, 09/25/2022   Fluad Trivalent(High Dose 65+) 10/31/2023   Influenza Inj Mdck Quad Pf 08/21/2016, 09/03/2018   Influenza Split 08/12/2014, 11/12/2015   Influenza, High Dose Seasonal PF 09/04/2019   Influenza, Seasonal, Injecte, Preservative Fre 08/12/2014, 11/12/2015, 08/21/2016, 08/31/2017   Influenza,inj,quad, With Preservative 08/21/2016   Influenza-Unspecified 08/13/2013   Moderna Sars-Covid-2 Vaccination 01/14/2020, 02/11/2020, 10/16/2020, 09/25/2022   PNEUMOCOCCAL CONJUGATE-20 01/25/2023   Pneumococcal Polysaccharide-23 11/27/2010   Pneumococcal-Unspecified 11/27/2010   Tdap 08/07/2012, 09/25/2022   Zoster Recombinant(Shingrix) 06/27/2018, 09/03/2018    Screening Tests Health Maintenance  Topic Date Due   COVID-19 Vaccine (5 - 2024-25 season) 07/29/2023   FOOT EXAM  04/24/2024  INFLUENZA VACCINE  06/27/2024   MAMMOGRAM  08/03/2024   HEMOGLOBIN A1C  08/06/2024   Diabetic kidney evaluation - Urine ACR  10/30/2024   Diabetic kidney evaluation - eGFR measurement  11/11/2024   OPHTHALMOLOGY EXAM  02/24/2025   Medicare Annual Wellness (AWV)  03/21/2025   Colonoscopy  12/03/2029   DTaP/Tdap/Td (3 - Td or Tdap) 09/25/2032   Pneumonia Vaccine 54+ Years old  Completed   DEXA SCAN  Completed   Hepatitis C Screening  Completed   Zoster Vaccines- Shingrix  Completed   HPV VACCINES  Aged Out   Meningococcal B Vaccine  Aged Out    Health Maintenance  Health Maintenance Due  Topic Date Due   COVID-19 Vaccine (5 - 2024-25 season)  07/29/2023   Health Maintenance Items Addressed: States will get covid vaccine this week.  Additional Screening:  Vision Screening: Recommended annual ophthalmology exams for early detection of glaucoma and other disorders of the eye.  Dental Screening: Recommended annual dental exams for proper oral hygiene  Community Resource Referral / Chronic Care Management: CRR required this visit?  No   CCM required this visit?  No     Plan:     I have personally reviewed and noted the following in the patient's chart:   Medical and social history Use of alcohol, tobacco or illicit drugs  Current medications and supplements including opioid prescriptions. Patient is not currently taking opioid prescriptions. Functional ability and status Nutritional status Physical activity Advanced directives List of other physicians Hospitalizations, surgeries, and ER visits in previous 12 months Vitals Screenings to include cognitive, depression, and falls Referrals and appointments  In addition, I have reviewed and discussed with patient certain preventive protocols, quality metrics, and best practice recommendations. A written personalized care plan for preventive services as well as general preventive health recommendations were provided to patient.     Areatha Beecham, LPN   9/60/4540   After Visit Summary: (Pick Up) Due to this being a telephonic visit, with patients personalized plan was offered to patient and patient has requested to Pick up at office.  Notes: Nothing significant to report at this time.

## 2024-03-21 NOTE — Patient Instructions (Signed)
 Samantha [name] , Thank you for taking time to come for your Medicare Wellness Visit. I appreciate your ongoing commitment to your health goals. Please review the following plan we discussed and let me know if I can assist you in the future.   Referrals/Orders/Follow-Ups/Clinician Recommendations: none  This is a list of the screening recommended for you and due dates:  Health Maintenance  Topic Date Due   COVID-19 Vaccine (5 - 2024-25 season) 07/29/2023   Complete foot exam   04/24/2024   Flu Shot  06/27/2024   Mammogram  08/03/2024   Hemoglobin A1C  08/06/2024   Yearly kidney health urinalysis for diabetes  10/30/2024   Yearly kidney function blood test for diabetes  11/11/2024   Eye exam for diabetics  02/24/2025   Medicare Annual Wellness Visit  03/21/2025   Colon Cancer Screening  12/03/2029   DTaP/Tdap/Td vaccine (3 - Td or Tdap) 09/25/2032   Pneumonia Vaccine  Completed   DEXA scan (bone density measurement)  Completed   Hepatitis C Screening  Completed   Zoster (Shingles) Vaccine  Completed   HPV Vaccine  Aged Out   Meningitis B Vaccine  Aged Out    Advanced directives: (Copy Requested) Please bring a copy of your health care power of attorney and living will to the office to be added to your chart at your convenience. You can mail to [facility] [address] [place], [place] [address] or email to [email or web address]  Next Medicare Annual Wellness Visit scheduled for next year: Yes  insert Preventive Care attachment Insert FALL PREVENTION attachment if needed

## 2024-03-26 ENCOUNTER — Other Ambulatory Visit: Payer: Self-pay | Admitting: Family Medicine

## 2024-03-26 DIAGNOSIS — F39 Unspecified mood [affective] disorder: Secondary | ICD-10-CM

## 2024-03-31 ENCOUNTER — Other Ambulatory Visit: Payer: Self-pay | Admitting: Family Medicine

## 2024-03-31 DIAGNOSIS — F39 Unspecified mood [affective] disorder: Secondary | ICD-10-CM

## 2024-03-31 DIAGNOSIS — E1140 Type 2 diabetes mellitus with diabetic neuropathy, unspecified: Secondary | ICD-10-CM

## 2024-03-31 MED ORDER — INSULIN DEGLUDEC FLEXTOUCH 100 UNIT/ML ~~LOC~~ SOPN: 14.0000 [IU] | PEN_INJECTOR | Freq: Every day | SUBCUTANEOUS | 3 refills | Status: AC

## 2024-03-31 MED ORDER — FLUOXETINE HCL 40 MG PO CAPS
40.0000 mg | ORAL_CAPSULE | Freq: Every day | ORAL | 1 refills | Status: DC
Start: 2024-03-31 — End: 2024-10-07

## 2024-03-31 NOTE — Telephone Encounter (Signed)
 Copied from CRM [id number]. Topic: Clinical - Medication Refill >> Mar 31, 2024 10:15 AM Annelle Kiel wrote: Most Recent Primary Care Visit:  Provider: ALLEN, NICKEAH E  Department: [facility]  Visit Type: ANNUAL WELL VISIT, SEQUENTIAL  Date: 03/21/2024  Medication:  FLUoxetine  (PROZAC ) 40 MG capsule [Insulin  Degludec FlexTouch 100 UNIT/ML   Has the patient contacted their pharmacy? No (Agent: If no, request that the patient contact the pharmacy for the refill. If patient does not wish to contact the pharmacy document the reason why and proceed with request.) (Agent: If yes, when and what did the pharmacy advise?)  Is this the correct pharmacy for this prescription? Yes If no, delete pharmacy and type the correct one.  This is the patient's preferred pharmacy:  [facility] [address], [place] - [address] [place] [address] Phone: [phone] Fax: [phone]  [facility] [address] [place]), [place] - [address] [place] ([place]) [place] [address] Phone: [phone] Fax: [phone]   Has the prescription been filled recently? No  Is the patient out of the medication? Yes  Has the patient been seen for an appointment in the last year OR does the patient have an upcoming appointment? Yes  Can we respond through MyChart? No  Agent: Please be advised that Rx refills may take up to 3 business days. We ask that you follow-up with your pharmacy.

## 2024-03-31 NOTE — Telephone Encounter (Signed)
 Requesting: FLUoxetine  (PROZAC ) 40 MG capsule , Insulin  Degludec FlexTouch 100 UNIT/ML SOPN  Last Visit: 02/04/2024 Next Visit: 05/12/2024 Last Refill: 08/01/2023 by Maryclare Smoke, PAC, 04/25/2023 by Maryclare Smoke, PAC  Please Advise

## 2024-04-10 ENCOUNTER — Other Ambulatory Visit: Payer: Self-pay | Admitting: Family Medicine

## 2024-04-10 DIAGNOSIS — M1990 Unspecified osteoarthritis, unspecified site: Secondary | ICD-10-CM

## 2024-05-12 ENCOUNTER — Ambulatory Visit (INDEPENDENT_AMBULATORY_CARE_PROVIDER_SITE_OTHER): Admitting: Family Medicine

## 2024-05-12 ENCOUNTER — Encounter: Payer: Self-pay | Admitting: Family Medicine

## 2024-05-12 ENCOUNTER — Ambulatory Visit: Payer: Self-pay | Admitting: Family Medicine

## 2024-05-12 VITALS — BP 134/74 | HR 67 | Temp 97.3°F | Ht 70.0 in | Wt 216.2 lb

## 2024-05-12 DIAGNOSIS — Z794 Long term (current) use of insulin: Secondary | ICD-10-CM

## 2024-05-12 DIAGNOSIS — E782 Mixed hyperlipidemia: Secondary | ICD-10-CM | POA: Diagnosis not present

## 2024-05-12 DIAGNOSIS — Z7985 Long-term (current) use of injectable non-insulin antidiabetic drugs: Secondary | ICD-10-CM | POA: Diagnosis not present

## 2024-05-12 DIAGNOSIS — E1140 Type 2 diabetes mellitus with diabetic neuropathy, unspecified: Secondary | ICD-10-CM

## 2024-05-12 DIAGNOSIS — Z7984 Long term (current) use of oral hypoglycemic drugs: Secondary | ICD-10-CM | POA: Diagnosis not present

## 2024-05-12 DIAGNOSIS — E1142 Type 2 diabetes mellitus with diabetic polyneuropathy: Secondary | ICD-10-CM | POA: Diagnosis not present

## 2024-05-12 DIAGNOSIS — I10 Essential (primary) hypertension: Secondary | ICD-10-CM | POA: Diagnosis not present

## 2024-05-12 DIAGNOSIS — T1591XA Foreign body on external eye, part unspecified, right eye, initial encounter: Secondary | ICD-10-CM

## 2024-05-12 DIAGNOSIS — N1831 Chronic kidney disease, stage 3a: Secondary | ICD-10-CM | POA: Diagnosis not present

## 2024-05-12 LAB — HEMOGLOBIN A1C: Hgb A1c MFr Bld: 6.7 % — ABNORMAL HIGH (ref 4.6–6.5)

## 2024-05-12 LAB — GLUCOSE, RANDOM: Glucose, Bld: 93 mg/dL (ref 70–99)

## 2024-05-12 NOTE — Assessment & Plan Note (Signed)
 I will check an A1c today. Continue metformin  1,000 mg bid, insulin  degludec 14 units sq daily, and semaglutide  (Ozempic ) 1 mg weekly.

## 2024-05-12 NOTE — Assessment & Plan Note (Signed)
 Continue focus on blood pressure and glucose control, adequate hydration, and avoidance of nephrotoxic medications.

## 2024-05-12 NOTE — Progress Notes (Signed)
 [facility] [address] [place] [address] Dept: [phone] Dept Fax: [phone]  Chronic Care Office Visit  Subjective:    Patient ID: Samantha [name], female    DOB: 31-Aug-1956, 68 y.o..   MRN: [id number]  Chief Complaint  Patient presents with   Hypertension    3 month f/u HTN.     History of Present Illness:  Patient is in today for reassessment of chronic medical issues.  Samantha [name] has Type 2 diabetes diagnosed in 2016. This is complicated with microalbuminuria and neuropathy. She is managed on metformin  1,000 mg bid, insulin  degludec 14 units sq daily, and semaglutide  (Ozempic ) 1 mg weekly (increased after her last visit). Her neuropathy is managed with gabapentin  300 mg 1 cap each morning and 2 caps each evening.   Samantha [name] has hypertension. She is managed on HCTZ 12.5 mg daily, valsartan  160 mg daily, and verapamil  240 mg daily.   Samantha [name] has hyperlipidemia. She is managed on lovastatin  20 mg daily.   Samantha [name] notes recently she has had a sensation as if she has something in her eye. It reminds her of when a contact lens gets out of position. She has been using Systane eye drops without relief.  Past Medical History: Patient Active Problem List   Diagnosis Date Noted   Migraine headache 02/04/2024   Stage 3a chronic kidney disease (CKD) (HCC) 11/01/2023   Type 2 diabetes mellitus with diabetic chronic kidney disease (HCC) 11/01/2023   Diabetic neuropathy (HCC) 10/31/2023   Allergic rhinitis 10/31/2023   Diverticulosis 10/23/2023   Syncope 10/07/2023   Neuroma of foot 02/20/2022   Unstable angina (HCC)    Arthritis 01/14/2021   History of colonic polyps 01/14/2021   Hyperlipidemia 02/23/2020   Microalbuminuria due to type 2 diabetes mellitus (HCC) 02/23/2020   B12 deficiency 02/23/2020   Vitamin D  deficiency 02/23/2020   Generalized anxiety disorder 02/23/2020   Degenerative disc  disease, lumbar 07/19/2018   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   Spondylosis of lumbar spine 07/19/2018   Lumbar radiculopathy 07/05/2018   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/21/2018   Eustachian tube dysfunction, bilateral 02/21/2018   Recurrent sinusitis 02/21/2018   GERD (gastroesophageal reflux disease) 10/24/2017   Pulmonary nodule, right 08/14/2017   Obesity (BMI 30-39.9) 06/11/2017   LBBB (left bundle branch block) 08/10/2016   Leukocytosis 08/10/2016   Essential hypertension 08/10/2016   Type 2 diabetes mellitus with diabetic neuropathy, with long-term current use of insulin  (HCC) 11/20/2015   Past Surgical History:  Procedure Laterality Date   CATARACT EXTRACTION W/PHACO Left 07/11/2022   Procedure: CATARACT EXTRACTION PHACO AND INTRAOCULAR LENS PLACEMENT (IOC) LEFT  DIABETIC;  Surgeon: Clair Crews, MD;  Location: [facility];  Service: Ophthalmology;  Laterality: Left;  3.60 0:22.7   CATARACT EXTRACTION W/PHACO Right 07/25/2022   Procedure: CATARACT EXTRACTION PHACO AND INTRAOCULAR LENS PLACEMENT (IOC) RIGHT DIABETIC 5.12 00:33.0;  Surgeon: Clair Crews, MD;  Location: [facility];  Service: Ophthalmology;  Laterality: Right;   KNEE ARTHROSCOPY Right 2017   LEFT HEART CATH AND CORONARY ANGIOGRAPHY N/A 06/30/2021   Procedure: LEFT HEART CATH AND CORONARY ANGIOGRAPHY;  Surgeon: Millicent Ally, MD;  Location: [facility];  Service: Cardiovascular;  Laterality: N/A;   MIDDLE EAR SURGERY Right    TONSILLECTOMY AND ADENOIDECTOMY  1962   Family History  Problem Relation Age of Onset   Diabetes Mother  Hypertension Mother    CAD Mother    Hypertension Father    Diabetes Father    CAD Father    Diabetes Sister    Hypertension Sister    Diabetes Brother    Hyperlipidemia Brother    Hypertension Brother    CAD Brother    Cancer Paternal Uncle        Brain   Cancer Paternal  Grandmother        Colon   Kidney disease Paternal Grandfather    Outpatient Medications Prior to Visit  Medication Sig Dispense Refill   albuterol  (VENTOLIN  HFA) 108 (90 Base) MCG/ACT inhaler Inhale 1-2 puffs into the lungs every 6 (six) hours as needed. 8 g 0   azelastine (ASTELIN) 0.1 % nasal spray Place 1 spray into both nostrils daily as needed for allergies.     Calcium Citrate-Vitamin D  (CALCIUM + D PO) Take 1 tablet by mouth every morning.      cetirizine (ZYRTEC) 10 MG tablet Take 10 mg by mouth daily.     diclofenac  (VOLTAREN ) 75 MG EC tablet Take 1 tablet by mouth twice daily as needed 60 tablet 3   diclofenac  Sodium (VOLTAREN ) 1 % GEL Apply 2 g topically 4 (four) times daily. 100 g 3   FLUoxetine  (PROZAC ) 40 MG capsule Take 1 capsule (40 mg total) by mouth daily. 90 capsule 1   fluticasone  (FLONASE ) 50 MCG/ACT nasal spray Place 2 sprays into both nostrils daily. 16 g 6   gabapentin  (NEURONTIN ) 300 MG capsule TAKE 1 CAPSULE BY MOUTH IN THE MORNING AND 2 IN THE EVENING 270 capsule 3   Galcanezumab-gnlm 120 MG/ML SOAJ Inject into the skin.     hydrochlorothiazide  (MICROZIDE ) 12.5 MG capsule Take 1 capsule (12.5 mg total) by mouth daily. 90 capsule 3   Insulin  Degludec FlexTouch 100 UNIT/ML SOPN Inject 14 Units into the skin at bedtime. 420 mL 3   lovastatin  (MEVACOR ) 20 MG tablet Take 1 tablet (20 mg total) by mouth daily. 90 tablet 1   meloxicam  (MOBIC ) 15 MG tablet Take by mouth.     metFORMIN  (GLUCOPHAGE ) 1000 MG tablet TAKE 1 TABLET BY MOUTH TWICE DAILY WITH A MEAL 180 tablet 3   nitroGLYCERIN  (NITROSTAT ) 0.4 MG SL tablet Place 1 tablet (0.4 mg total) under the tongue every 5 (five) minutes as needed for chest pain. 30 tablet 12   pantoprazole  (PROTONIX ) 40 MG tablet Take 1 tablet by mouth once daily (Patient taking differently: Take 40 mg by mouth at bedtime.) 90 tablet 0   Semaglutide , 1 MG/DOSE, 4 MG/3ML SOPN Inject 1 mg as directed once a week. 9 mL 3   sucralfate  (CARAFATE )  1 g tablet Take 1 tablet (1 g total) by mouth 3 (three) times daily. 90 tablet 0   SUMAtriptan (IMITREX) 50 MG tablet Take 50 mg by mouth.     valsartan  (DIOVAN ) 160 MG tablet Take 1 tablet (160 mg total) by mouth daily. 90 tablet 3   verapamil  (CALAN -SR) 240 MG CR tablet Take 1 tablet (240 mg total) by mouth daily. 90 tablet 3   No facility-administered medications prior to visit.   Allergies  Allergen Reactions   Iohexol  Anaphylaxis     Code: SOB, Desc: respiratory distress-ASM, Onset Date: [id number]    Iodinated Contrast Media Other (See Comments)   Jardiance [Empagliflozin] Rash   Objective:   Today's Vitals   05/12/24 1332  BP: 134/74  Pulse: 67  Temp: (!) 97.3 F (36.3 C)  TempSrc:  Temporal  SpO2: 97%  Weight: 216 lb 3.2 oz (98.1 kg)  Height: 5' 10 (1.778 m)   Body mass index is 31.02 kg/m.   General: Well developed, well nourished. No acute distress. HEENT: There is a lash that is growing off of the upper right lid out of line with the other lashes.   The lash is laying against the surface of the eye. I removed this with splinter forceps and she   had immediate relief of her symptoms. Feet- Skin intact. No sign of maceration between toes. Nails are normal. Dorsalis pedis and   posterior tibial artery pulses are normal. 5.07 monofilament testing shows the left foot is   insensate and some limited areas of loss of fine touch on the right. There are hamemr toe   deformities of the left 2nd-4th toes. Psych: Alert and oriented. Normal mood and affect.  There are no preventive care reminders to display for this patient.    Assessment & Plan:   Problem List Items Addressed This Visit       Cardiovascular and Mediastinum   Essential hypertension - Primary   Blood pressure is in good control. Continue HCTZ 12.5 mg daily, valsartan  160 mg daily, and verapamil  240 mg daily.        Endocrine   Type 2 diabetes mellitus with diabetic neuropathy, with long-term current  use of insulin  (HCC) (Chronic)   I will check an A1c today. Continue metformin  1,000 mg bid, insulin  degludec 14 units sq daily, and semaglutide  (Ozempic ) 1 mg weekly.      Relevant Orders   Glucose, random   Hemoglobin A1c   Diabetic neuropathy (HCC)   Reinforced importance of regular use of lotions on her feet and observing daily for any injury.        Genitourinary   Stage 3a chronic kidney disease (CKD) (HCC)   Continue focus on blood pressure and glucose control, adequate hydration, and avoidance of nephrotoxic medications.         Other   Hyperlipidemia   Lipids at goal. Continue lovastatin  20 mg daily.      Other Visit Diagnoses       Foreign body of right eye, initial encounter       Samantha [name] removed as noted above.       Return in about 3 months (around 08/12/2024) for Reassessment.   Graig Lawyer, MD

## 2024-05-12 NOTE — Assessment & Plan Note (Signed)
 Blood pressure is in good control. Continue HCTZ 12.5 mg daily, valsartan 160 mg daily, and verapamil 240 mg daily.

## 2024-05-12 NOTE — Assessment & Plan Note (Signed)
 Reinforced importance of regular use of lotions on her feet and observing daily for any injury.

## 2024-05-12 NOTE — Assessment & Plan Note (Signed)
 Lipids at goal. Continue lovastatin 20 mg daily.

## 2024-06-02 DIAGNOSIS — H35371 Puckering of macula, right eye: Secondary | ICD-10-CM | POA: Diagnosis not present

## 2024-06-02 DIAGNOSIS — D3131 Benign neoplasm of right choroid: Secondary | ICD-10-CM | POA: Diagnosis not present

## 2024-06-02 DIAGNOSIS — E119 Type 2 diabetes mellitus without complications: Secondary | ICD-10-CM | POA: Diagnosis not present

## 2024-06-02 DIAGNOSIS — Z0100 Encounter for examination of eyes and vision without abnormal findings: Secondary | ICD-10-CM | POA: Diagnosis not present

## 2024-06-02 DIAGNOSIS — H43813 Vitreous degeneration, bilateral: Secondary | ICD-10-CM | POA: Diagnosis not present

## 2024-06-02 LAB — HM DIABETES EYE EXAM

## 2024-06-04 DIAGNOSIS — M170 Bilateral primary osteoarthritis of knee: Secondary | ICD-10-CM | POA: Diagnosis not present

## 2024-06-04 DIAGNOSIS — M25551 Pain in right hip: Secondary | ICD-10-CM | POA: Diagnosis not present

## 2024-06-05 ENCOUNTER — Encounter: Payer: Self-pay | Admitting: Family Medicine

## 2024-06-06 DIAGNOSIS — M1611 Unilateral primary osteoarthritis, right hip: Secondary | ICD-10-CM | POA: Diagnosis not present

## 2024-07-17 ENCOUNTER — Other Ambulatory Visit: Payer: Self-pay | Admitting: Family Medicine

## 2024-07-17 DIAGNOSIS — E1169 Type 2 diabetes mellitus with other specified complication: Secondary | ICD-10-CM

## 2024-07-18 ENCOUNTER — Other Ambulatory Visit: Payer: Self-pay

## 2024-07-18 DIAGNOSIS — E1169 Type 2 diabetes mellitus with other specified complication: Secondary | ICD-10-CM

## 2024-07-18 MED ORDER — LOVASTATIN 20 MG PO TABS
20.0000 mg | ORAL_TABLET | Freq: Every day | ORAL | 3 refills | Status: AC
Start: 2024-07-18 — End: ?

## 2024-07-18 NOTE — Telephone Encounter (Signed)
 LR  hx provider LOV  05/12/24 FOV  08/18/24  Please review and advise.  Thanks. Dm/cma

## 2024-07-18 NOTE — Telephone Encounter (Signed)
 Patient notified VIA phone. Dm/cma

## 2024-07-18 NOTE — Telephone Encounter (Signed)
 Copied from CRM #[id number]. Topic: Clinical - Medication Question >> Jul 18, 2024 11:30 AM Revonda D wrote: Reason for CRM: Pt is requesting a new prescription for the lovastatin  (MEVACOR ) 20 MG tablet. Pt stated that it was recently prescribed by a different provider that she no longer see's and would like for Dr.Rudd to start prescribing the medication. Pt would like a callback with an update on this request.

## 2024-07-22 ENCOUNTER — Other Ambulatory Visit: Payer: Self-pay | Admitting: Family Medicine

## 2024-07-22 DIAGNOSIS — M1990 Unspecified osteoarthritis, unspecified site: Secondary | ICD-10-CM

## 2024-07-22 NOTE — Telephone Encounter (Signed)
 Refill request for  Diclofenac  Sodium tabs LR  04/10/24, #60, 3 rf LOV  05/11/24 FOV  08/18/24  Please review and advise.  Thanks. Dm/cma

## 2024-08-18 ENCOUNTER — Ambulatory Visit (INDEPENDENT_AMBULATORY_CARE_PROVIDER_SITE_OTHER): Admitting: Family Medicine

## 2024-08-18 ENCOUNTER — Encounter: Payer: Self-pay | Admitting: Family Medicine

## 2024-08-18 ENCOUNTER — Ambulatory Visit: Payer: Self-pay | Admitting: Family Medicine

## 2024-08-18 VITALS — BP 128/70 | HR 84 | Temp 97.4°F | Ht 70.0 in | Wt 216.6 lb

## 2024-08-18 DIAGNOSIS — Z794 Long term (current) use of insulin: Secondary | ICD-10-CM

## 2024-08-18 DIAGNOSIS — N1831 Chronic kidney disease, stage 3a: Secondary | ICD-10-CM | POA: Diagnosis not present

## 2024-08-18 DIAGNOSIS — Z23 Encounter for immunization: Secondary | ICD-10-CM | POA: Diagnosis not present

## 2024-08-18 DIAGNOSIS — M25562 Pain in left knee: Secondary | ICD-10-CM

## 2024-08-18 DIAGNOSIS — E1129 Type 2 diabetes mellitus with other diabetic kidney complication: Secondary | ICD-10-CM

## 2024-08-18 DIAGNOSIS — E782 Mixed hyperlipidemia: Secondary | ICD-10-CM | POA: Diagnosis not present

## 2024-08-18 DIAGNOSIS — I10 Essential (primary) hypertension: Secondary | ICD-10-CM

## 2024-08-18 DIAGNOSIS — G8929 Other chronic pain: Secondary | ICD-10-CM | POA: Insufficient documentation

## 2024-08-18 DIAGNOSIS — R809 Proteinuria, unspecified: Secondary | ICD-10-CM | POA: Diagnosis not present

## 2024-08-18 DIAGNOSIS — Z7984 Long term (current) use of oral hypoglycemic drugs: Secondary | ICD-10-CM

## 2024-08-18 DIAGNOSIS — Z1231 Encounter for screening mammogram for malignant neoplasm of breast: Secondary | ICD-10-CM

## 2024-08-18 DIAGNOSIS — E1122 Type 2 diabetes mellitus with diabetic chronic kidney disease: Secondary | ICD-10-CM

## 2024-08-18 LAB — MICROALBUMIN / CREATININE URINE RATIO
Creatinine,U: 213.4 mg/dL
Microalb Creat Ratio: 8.7 mg/g (ref 0.0–30.0)
Microalb, Ur: 1.8 mg/dL (ref 0.0–1.9)

## 2024-08-18 LAB — GLUCOSE, RANDOM: Glucose, Bld: 108 mg/dL — ABNORMAL HIGH (ref 70–99)

## 2024-08-18 LAB — HEMOGLOBIN A1C: Hgb A1c MFr Bld: 7.2 % — ABNORMAL HIGH (ref 4.6–6.5)

## 2024-08-18 NOTE — Assessment & Plan Note (Signed)
 Blood pressure is in good control. Continue HCTZ 12.5 mg daily, valsartan 160 mg daily, and verapamil 240 mg daily.

## 2024-08-18 NOTE — Progress Notes (Signed)
 [facility] [address] [place] [address] Dept: [phone] Dept Fax: [phone]  Chronic Care Office Visit  Subjective:    Patient ID: Samantha [name], female    DOB: May 01, 1956, 68 y.o..   MRN: [id number]  Chief Complaint  Patient presents with   Hypertension    3 month f/u HTN.  No concerns.  Flu shot today.   History of Present Illness:  Patient is in today for reassessment of chronic medical issues.  Samantha [name] has Type 2 diabetes diagnosed in 2016. This is complicated with microalbuminuria and neuropathy. She is managed on metformin  1,000 mg bid, insulin  degludec 14 units sq daily. Her neuropathy is managed with gabapentin  300 mg 1 cap each morning and 2 caps each evening. Samantha [name] had been on semaglutide , but has stopped this.   Samantha [name] has hypertension. She is managed on HCTZ 12.5 mg daily, valsartan  160 mg daily, and verapamil  240 mg daily.   Samantha [name] has hyperlipidemia. She is managed on lovastatin  20 mg daily.  Samantha [name] has been having issues with left knee pain. She admits to some swelling. She has popping and at times the knee suddenly gives way. She is now using a cane. She had tried using some ibuprofen occasionally for this. She is scheduled with orthopedics in a few weeks.  Past Medical History: Patient Active Problem List   Diagnosis Date Noted   Migraine headache 02/04/2024   Stage 3a chronic kidney disease (CKD) (HCC) 11/01/2023   Type 2 diabetes mellitus with diabetic chronic kidney disease (HCC) 11/01/2023   Diabetic neuropathy (HCC) 10/31/2023   Allergic rhinitis 10/31/2023   Diverticulosis 10/23/2023   Syncope 10/07/2023   Neuroma of foot 02/20/2022   Unstable angina (HCC)    Arthritis 01/14/2021   History of colonic polyps 01/14/2021   Hyperlipidemia 02/23/2020   Microalbuminuria due to type 2 diabetes mellitus (HCC) 02/23/2020   B12 deficiency 02/23/2020    Vitamin D  deficiency 02/23/2020   Generalized anxiety disorder 02/23/2020   Degenerative disc disease, lumbar 07/19/2018   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   Spondylosis of lumbar spine 07/19/2018   Lumbar radiculopathy 07/05/2018   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/21/2018   Eustachian tube dysfunction, bilateral 02/21/2018   Recurrent sinusitis 02/21/2018   GERD (gastroesophageal reflux disease) 10/24/2017   Pulmonary nodule, right 08/14/2017   Obesity (BMI 30-39.9) 06/11/2017   LBBB (left bundle branch block) 08/10/2016   Leukocytosis 08/10/2016   Essential hypertension 08/10/2016   Type 2 diabetes mellitus with diabetic neuropathy, with long-term current use of insulin  (HCC) 11/20/2015   Past Surgical History:  Procedure Laterality Date   CATARACT EXTRACTION W/PHACO Left 07/11/2022   Procedure: CATARACT EXTRACTION PHACO AND INTRAOCULAR LENS PLACEMENT (IOC) LEFT  DIABETIC;  Surgeon: Jaye Fallow, MD;  Location: [facility];  Service: Ophthalmology;  Laterality: Left;  3.60 0:22.7   CATARACT EXTRACTION W/PHACO Right 07/25/2022   Procedure: CATARACT EXTRACTION PHACO AND INTRAOCULAR LENS PLACEMENT (IOC) RIGHT DIABETIC 5.12 00:33.0;  Surgeon: Jaye Fallow, MD;  Location: [facility];  Service: Ophthalmology;  Laterality: Right;   KNEE ARTHROSCOPY Right 2017   LEFT HEART CATH AND CORONARY ANGIOGRAPHY N/A 06/30/2021   Procedure: LEFT HEART CATH AND CORONARY ANGIOGRAPHY;  Surgeon: Burnard Debby LABOR, MD;  Location: [facility];  Service: Cardiovascular;  Laterality: N/A;   MIDDLE EAR SURGERY Right    TONSILLECTOMY AND ADENOIDECTOMY  1962  Family History  Problem Relation Age of Onset   Diabetes Mother    Hypertension Mother    CAD Mother    Hypertension Father    Diabetes Father    CAD Father    Diabetes Sister    Hypertension Sister    Diabetes Brother    Hyperlipidemia Brother     Hypertension Brother    CAD Brother    Cancer Paternal Uncle        Brain   Cancer Paternal Grandmother        Colon   Kidney disease Paternal Grandfather    Outpatient Medications Prior to Visit  Medication Sig Dispense Refill   albuterol  (VENTOLIN  HFA) 108 (90 Base) MCG/ACT inhaler Inhale 1-2 puffs into the lungs every 6 (six) hours as needed. 8 g 0   azelastine (ASTELIN) 0.1 % nasal spray Place 1 spray into both nostrils daily as needed for allergies.     Calcium Citrate-Vitamin D  (CALCIUM + D PO) Take 1 tablet by mouth every morning.      cetirizine (ZYRTEC) 10 MG tablet Take 10 mg by mouth daily.     diclofenac  (VOLTAREN ) 75 MG EC tablet Take 1 tablet by mouth twice daily as needed 60 tablet 0   diclofenac  Sodium (VOLTAREN ) 1 % GEL Apply 2 g topically 4 (four) times daily. 100 g 3   FLUoxetine  (PROZAC ) 40 MG capsule Take 1 capsule (40 mg total) by mouth daily. 90 capsule 1   fluticasone  (FLONASE ) 50 MCG/ACT nasal spray Place 2 sprays into both nostrils daily. 16 g 6   gabapentin  (NEURONTIN ) 300 MG capsule TAKE 1 CAPSULE BY MOUTH IN THE MORNING AND 2 IN THE EVENING 270 capsule 3   Galcanezumab-gnlm 120 MG/ML SOAJ Inject into the skin.     hydrochlorothiazide  (MICROZIDE ) 12.5 MG capsule Take 1 capsule (12.5 mg total) by mouth daily. 90 capsule 3   Insulin  Degludec FlexTouch 100 UNIT/ML SOPN Inject 14 Units into the skin at bedtime. 420 mL 3   lovastatin  (MEVACOR ) 20 MG tablet Take 1 tablet (20 mg total) by mouth daily. 90 tablet 3   meloxicam  (MOBIC ) 15 MG tablet Take by mouth.     metFORMIN  (GLUCOPHAGE ) 1000 MG tablet TAKE 1 TABLET BY MOUTH TWICE DAILY WITH A MEAL 180 tablet 3   nitroGLYCERIN  (NITROSTAT ) 0.4 MG SL tablet Place 1 tablet (0.4 mg total) under the tongue every 5 (five) minutes as needed for chest pain. 30 tablet 12   pantoprazole  (PROTONIX ) 40 MG tablet Take 1 tablet by mouth once daily (Patient taking differently: Take 40 mg by mouth at bedtime.) 90 tablet 0   sucralfate   (CARAFATE ) 1 g tablet Take 1 tablet (1 g total) by mouth 3 (three) times daily. 90 tablet 0   SUMAtriptan (IMITREX) 50 MG tablet Take 50 mg by mouth.     valsartan  (DIOVAN ) 160 MG tablet Take 1 tablet (160 mg total) by mouth daily. 90 tablet 3   verapamil  (CALAN -SR) 240 MG CR tablet Take 1 tablet (240 mg total) by mouth daily. 90 tablet 3   Semaglutide , 1 MG/DOSE, 4 MG/3ML SOPN Inject 1 mg as directed once a week. (Patient not taking: Reported on 08/18/2024) 9 mL 3   No facility-administered medications prior to visit.   Allergies  Allergen Reactions   Iohexol  Anaphylaxis     Code: SOB, Desc: respiratory distress-ASM, Onset Date: 90717989    Iodinated Contrast Media Other (See Comments)   Jardiance [Empagliflozin] Rash   Objective:  Today's Vitals   08/18/24 1343  BP: 128/70  Pulse: 84  Temp: (!) 97.4 F (36.3 C)  TempSrc: Temporal  SpO2: 96%  Weight: 216 lb 9.6 oz (98.2 kg)  Height: 5' 10 (1.778 m)   Body mass index is 31.08 kg/m.   General: Well developed, well nourished. No acute distress. Extremities: The left knee is slightly warmer than on the right. There is mild   enlargement of the knee joint. Fair ROM. Psych: Alert and oriented. Normal mood and affect.  Health Maintenance Due  Topic Date Due   Diabetic kidney evaluation - Urine ACR  01/25/2024   Influenza Vaccine  06/27/2024   Mammogram  08/03/2024     Assessment & Plan:   Problem List Items Addressed This Visit       Cardiovascular and Mediastinum   Essential hypertension   Blood pressure is in good control. Continue HCTZ 12.5 mg daily, valsartan  160 mg daily, and verapamil  240 mg daily.        Endocrine   Microalbuminuria due to type 2 diabetes mellitus (HCC)   I will check a micral today. Continue focus on blood pressure and glucose control, adequate hydration, and avoidance of nephrotoxic medications. Continue ARB.      Relevant Orders   Microalbumin / creatinine urine ratio   Type 2  diabetes mellitus with diabetic chronic kidney disease (HCC) - Primary   I will check an A1c today. Continue metformin  1,000 mg bid, insulin  degludec 14 units sq daily. If A1c is elevated above goal, I would consider an SGLT2i in light of her CKD.      Relevant Orders   Glucose, random   Hemoglobin A1c   Microalbumin / creatinine urine ratio     Genitourinary   Stage 3a chronic kidney disease (CKD) (HCC)   Continue focus on blood pressure and glucose control, adequate hydration, and avoidance of nephrotoxic medications. Continue ARB.         Other   Chronic pain of left knee   I supsect she may have a meniscal injury of the left knee. I recommend she consider use of a knee brace/support. Also consider Tylenol  Arthritis 650 mg qid for pain management. Follow-up with orthopedics.      Hyperlipidemia   Lipids at goal. Continue lovastatin  20 mg daily.      Other Visit Diagnoses       Encounter for screening mammogram for malignant neoplasm of breast       Relevant Orders   MM DIGITAL SCREENING BILATERAL     Need for immunization against influenza       Relevant Orders   Flu vaccine HIGH DOSE PF(Fluzone Trivalent) (Completed)       Return in about 3 months (around 11/17/2024) for Reassessment.   Garnette CHRISTELLA Simpler, MD

## 2024-08-18 NOTE — Assessment & Plan Note (Signed)
 Lipids at goal. Continue lovastatin 20 mg daily.

## 2024-08-18 NOTE — Assessment & Plan Note (Signed)
 I will check an A1c today. Continue metformin  1,000 mg bid, insulin  degludec 14 units sq daily. If A1c is elevated above goal, I would consider an SGLT2i in light of her CKD.

## 2024-08-18 NOTE — Assessment & Plan Note (Signed)
 I supsect she may have a meniscal injury of the left knee. I recommend she consider use of a knee brace/support. Also consider Tylenol  Arthritis 650 mg qid for pain management. Follow-up with orthopedics.

## 2024-08-18 NOTE — Assessment & Plan Note (Signed)
 I will check a micral today. Continue focus on blood pressure and glucose control, adequate hydration, and avoidance of nephrotoxic medications. Continue ARB.

## 2024-08-18 NOTE — Assessment & Plan Note (Addendum)
 Continue focus on blood pressure and glucose control, adequate hydration, and avoidance of nephrotoxic medications. Continue ARB.

## 2024-08-28 DIAGNOSIS — H40013 Open angle with borderline findings, low risk, bilateral: Secondary | ICD-10-CM | POA: Diagnosis not present

## 2024-08-28 LAB — HM DIABETES EYE EXAM

## 2024-08-29 ENCOUNTER — Encounter: Payer: Self-pay | Admitting: Family Medicine

## 2024-09-04 ENCOUNTER — Other Ambulatory Visit: Payer: Self-pay | Admitting: Family Medicine

## 2024-09-04 DIAGNOSIS — M1990 Unspecified osteoarthritis, unspecified site: Secondary | ICD-10-CM

## 2024-09-08 DIAGNOSIS — M170 Bilateral primary osteoarthritis of knee: Secondary | ICD-10-CM | POA: Diagnosis not present

## 2024-09-22 ENCOUNTER — Ambulatory Visit
Admission: RE | Admit: 2024-09-22 | Discharge: 2024-09-22 | Disposition: A | Discharge status: Home / Self Care | Source: Ambulatory Visit | Attending: Family Medicine | Admitting: Family Medicine

## 2024-09-22 DIAGNOSIS — Z1231 Encounter for screening mammogram for malignant neoplasm of breast: Secondary | ICD-10-CM | POA: Diagnosis not present

## 2024-09-25 ENCOUNTER — Other Ambulatory Visit: Payer: Self-pay | Admitting: Family

## 2024-09-25 ENCOUNTER — Other Ambulatory Visit: Payer: Self-pay | Admitting: Family Medicine

## 2024-09-25 DIAGNOSIS — F39 Unspecified mood [affective] disorder: Secondary | ICD-10-CM

## 2024-09-25 DIAGNOSIS — M1990 Unspecified osteoarthritis, unspecified site: Secondary | ICD-10-CM

## 2024-09-26 NOTE — Telephone Encounter (Signed)
 Refill request for  Diclofenac  tabs LR  07/22/24, #60, 0 rf  LOV  08/18/24 FOV  11/24/24  Please review and advise.  Thanks. Dm/cma

## 2024-10-03 ENCOUNTER — Other Ambulatory Visit: Payer: Self-pay

## 2024-10-03 ENCOUNTER — Ambulatory Visit
Admission: EM | Admit: 2024-10-03 | Discharge: 2024-10-03 | Disposition: A | Discharge status: Home / Self Care | Attending: Family Medicine | Admitting: Family Medicine

## 2024-10-03 ENCOUNTER — Encounter: Payer: Self-pay | Admitting: *Deleted

## 2024-10-03 DIAGNOSIS — H7291 Unspecified perforation of tympanic membrane, right ear: Secondary | ICD-10-CM | POA: Diagnosis not present

## 2024-10-03 DIAGNOSIS — H6691 Otitis media, unspecified, right ear: Secondary | ICD-10-CM | POA: Diagnosis not present

## 2024-10-03 DIAGNOSIS — J069 Acute upper respiratory infection, unspecified: Secondary | ICD-10-CM

## 2024-10-03 LAB — POC SOFIA SARS ANTIGEN FIA: SARS Coronavirus 2 Ag: NEGATIVE

## 2024-10-03 MED ORDER — OFLOXACIN 0.3 % OT SOLN: 5.0000 [drp] | Freq: Two times a day (BID) | OTIC | 0 refills | Status: AC

## 2024-10-03 MED ORDER — CEFDINIR 300 MG PO CAPS
600.0000 mg | ORAL_CAPSULE | Freq: Every day | ORAL | 0 refills | Status: AC
Start: 2024-10-03 — End: 2024-10-10

## 2024-10-03 NOTE — Discharge Instructions (Signed)
 Testing for COVID was negative  Take cefdinir  300 mg--2 capsules together daily for 7 days  -Floxin eardrops-5 drops in the affected ear 2 times daily for 7 days.  Since your kidney function is reduced compared to normal, it is best to stick with Tylenol  as needed for any pain you are having.  Please follow-up with your primary care

## 2024-10-03 NOTE — ED Provider Notes (Addendum)
 [name]    CSN: [id number] Arrival date & time: 10/03/24  1530      History   Chief Complaint Chief Complaint  Patient presents with   Sore Throat   Facial Pain    HPI Samantha [name] is a 68 y.o. female.    Sore Throat  For sore throat and right ear pain and right ear drainage.  She has also had some sinus pressure and nasal drainage and congestion.  Symptoms began on November 2 and then the ear pain and drainage began about 3 days ago.  No fever noted p  No chest tightness or shortness of breath She is allergic to iodinated contrast and Jardiance  Of note her last eGFR was in the 40s.  She states she does take diclofenac  that is prescribed daily. When I ask her today what she is taking for pain relief for the sinus symptoms, she states she is also taking ibuprofen.  Past Medical History:  Diagnosis Date   Arthritis    Dyslipidemia    Heart disease    Heart murmur 2011   Left bundle branch block 2011   Type II or unspecified type diabetes mellitus without mention of complication, not stated as uncontrolled    Unspecified essential hypertension     Patient Active Problem List   Diagnosis Date Noted   Chronic pain of left knee 08/18/2024   Migraine headache 02/04/2024   Stage 3a chronic kidney disease (CKD) (HCC) 11/01/2023   Type 2 diabetes mellitus with diabetic chronic kidney disease (HCC) 11/01/2023   Diabetic neuropathy (HCC) 10/31/2023   Allergic rhinitis 10/31/2023   Diverticulosis 10/23/2023   Syncope 10/07/2023   Neuroma of foot 02/20/2022   Unstable angina (HCC)    Arthritis 01/14/2021   History of colonic polyps 01/14/2021   Hyperlipidemia 02/23/2020   Microalbuminuria due to type 2 diabetes mellitus (HCC) 02/23/2020   B12 deficiency 02/23/2020   Vitamin D  deficiency 02/23/2020   Generalized anxiety disorder 02/23/2020   Degenerative disc disease, lumbar 07/19/2018   Spinal stenosis of lumbar region with neurogenic  claudication 07/19/2018   Spondylosis of lumbar spine 07/19/2018   Lumbar radiculopathy 07/05/2018   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/21/2018   Eustachian tube dysfunction, bilateral 02/21/2018   Recurrent sinusitis 02/21/2018   GERD (gastroesophageal reflux disease) 10/24/2017   Pulmonary nodule, right 08/14/2017   Obesity (BMI 30-39.9) 06/11/2017   LBBB (left bundle branch block) 08/10/2016   Leukocytosis 08/10/2016   Essential hypertension 08/10/2016   Type 2 diabetes mellitus with diabetic neuropathy, with long-term current use of insulin  (HCC) 11/20/2015    Past Surgical History:  Procedure Laterality Date   CATARACT EXTRACTION W/PHACO Left 07/11/2022   Procedure: CATARACT EXTRACTION PHACO AND INTRAOCULAR LENS PLACEMENT (IOC) LEFT  DIABETIC;  Surgeon: Jaye Fallow, MD;  Location: [facility];  Service: Ophthalmology;  Laterality: Left;  3.60 0:22.7   CATARACT EXTRACTION W/PHACO Right 07/25/2022   Procedure: CATARACT EXTRACTION PHACO AND INTRAOCULAR LENS PLACEMENT (IOC) RIGHT DIABETIC 5.12 00:33.0;  Surgeon: Jaye Fallow, MD;  Location: [facility];  Service: Ophthalmology;  Laterality: Right;   KNEE ARTHROSCOPY Right 2017   LEFT HEART CATH AND CORONARY ANGIOGRAPHY N/A 06/30/2021   Procedure: LEFT HEART CATH AND CORONARY ANGIOGRAPHY;  Surgeon: Burnard Debby LABOR, MD;  Location: [facility];  Service: Cardiovascular;  Laterality: N/A;   MIDDLE EAR SURGERY Right    TONSILLECTOMY AND ADENOIDECTOMY  1962    OB History  No obstetric history on file.      Home Medications    Prior to Admission medications   Medication Sig Start Date End Date Taking? Authorizing Provider  Calcium Citrate-Vitamin D  (CALCIUM + D PO) Take 1 tablet by mouth every morning.    Yes [provider]  cefdinir  (OMNICEF ) 300 MG capsule Take 2 capsules (600 mg total) by mouth daily for 7 days. 10/03/24 10/10/24 Yes Clair Bardwell K, MD   cetirizine (ZYRTEC) 10 MG tablet Take 10 mg by mouth daily.   Yes [provider]  diclofenac  (VOLTAREN ) 75 MG EC tablet Take 1 tablet by mouth twice daily as needed 09/26/24  Yes Rudd, Garnette HERO, MD  diclofenac  Sodium (VOLTAREN ) 1 % GEL Apply 2 g topically 4 (four) times daily. 07/13/21  Yes Prosperi, Christian H, PA-C  FLUoxetine  (PROZAC ) 40 MG capsule Take 1 capsule (40 mg total) by mouth daily. 03/31/24  Yes Webb, Padonda B, FNP  fluticasone  (FLONASE ) 50 MCG/ACT nasal spray Place 2 sprays into both nostrils daily. 10/31/23  Yes RuddGarnette HERO, MD  gabapentin  (NEURONTIN ) 300 MG capsule TAKE 1 CAPSULE BY MOUTH IN THE MORNING AND 2 IN THE EVENING 10/31/23  Yes Thedora Garnette HERO, MD  hydrochlorothiazide  (MICROZIDE ) 12.5 MG capsule Take 1 capsule (12.5 mg total) by mouth daily. 03/05/24  Yes Thedora Garnette HERO, MD  Insulin  Degludec FlexTouch 100 UNIT/ML SOPN Inject 14 Units into the skin at bedtime. 03/31/24  Yes Webb, Padonda B, FNP  lovastatin  (MEVACOR ) 20 MG tablet Take 1 tablet (20 mg total) by mouth daily. 07/18/24  Yes Thedora Garnette HERO, MD  metFORMIN  (GLUCOPHAGE ) 1000 MG tablet TAKE 1 TABLET BY MOUTH TWICE DAILY WITH A MEAL 10/31/23  Yes Thedora Garnette HERO, MD  ofloxacin (FLOXIN) 0.3 % OTIC solution Place 5 drops into both ears 2 (two) times daily for 7 days. 10/03/24 10/10/24 Yes BanisterSharlet POUR, MD  pantoprazole  (PROTONIX ) 40 MG tablet Take 1 tablet by mouth once daily Patient taking differently: Take 40 mg by mouth at bedtime. 10/16/22  Yes Boscia, Powell BRAVO, NP  valsartan  (DIOVAN ) 160 MG tablet Take 1 tablet (160 mg total) by mouth daily. 03/05/24  Yes Thedora Garnette HERO, MD  verapamil  (CALAN -SR) 240 MG CR tablet Take 1 tablet (240 mg total) by mouth daily. 03/05/24  Yes Thedora Garnette HERO, MD  albuterol  (VENTOLIN  HFA) 108 [phone] Base) MCG/ACT inhaler Inhale 1-2 puffs into the lungs every 6 (six) hours as needed. 11/05/23   Nche, Roselie Rockford, NP  nitroGLYCERIN  (NITROSTAT ) 0.4 MG SL tablet Place 1 tablet (0.4  mg total) under the tongue every 5 (five) minutes as needed for chest pain. 06/29/21   Nahser, Aleene PARAS, MD  sucralfate  (CARAFATE ) 1 g tablet Take 1 tablet (1 g total) by mouth 3 (three) times daily. 11/12/23   Lorelle Aleck BROCKS, PA-C  SUMAtriptan (IMITREX) 50 MG tablet Take 50 mg by mouth. 10/18/23 10/17/24  [provider]    Family History Family History  Problem Relation Age of Onset   Diabetes Mother    Hypertension Mother    CAD Mother    Hypertension Father    Diabetes Father    CAD Father    Diabetes Sister    Hypertension Sister    Diabetes Brother    Hyperlipidemia Brother    Hypertension Brother    CAD Brother    Cancer Paternal Uncle        Brain   Cancer Paternal Grandmother  Colon   Kidney disease Paternal Grandfather     Social History Social History   Tobacco Use   Smoking status: Never    Passive exposure: Never   Smokeless tobacco: Never  Vaping Use   Vaping status: Never Used  Substance Use Topics   Alcohol use: No   Drug use: No     Allergies   Iohexol , Iodinated contrast media, and Jardiance [empagliflozin]   Review of Systems Review of Systems   Physical Exam Triage Vital Signs ED Triage Vitals  Encounter Vitals Group     BP 10/03/24 1545 108/67     Girls Systolic BP Percentile --      Girls Diastolic BP Percentile --      Boys Systolic BP Percentile --      Boys Diastolic BP Percentile --      Pulse Rate 10/03/24 1545 66     Resp 10/03/24 1545 18     Temp 10/03/24 1545 (!) 97.5 F (36.4 C)     Temp Source 10/03/24 1545 Oral     SpO2 10/03/24 1545 94 %     Weight --      Height --      Head Circumference --      Peak Flow --      Pain Score 10/03/24 1547 6     Pain Loc --      Pain Education --      Exclude from Growth Chart --    No data found.  Updated Vital Signs BP 108/67   Pulse 66   Temp (!) 97.5 F (36.4 C) (Oral)   Resp 18   SpO2 94%   Visual Acuity Right Eye Distance:   Left Eye  Distance:   Bilateral Distance:    Right Eye Near:   Left Eye Near:    Bilateral Near:     Physical Exam Vitals reviewed.  Constitutional:      General: She is not in acute distress.    Appearance: She is not ill-appearing, toxic-appearing or diaphoretic.  HENT:     Right Ear: Ear canal normal.     Left Ear: Ear canal normal.     Ears:     Comments: Right tympanic membrane is dull with erythema and altered landmarks.  There is possibly discharge on the tympanic membrane  Left tympanic membrane is dull with some white scarring    Nose: Nose normal.     Mouth/Throat:     Mouth: Mucous membranes are moist.     Pharynx: No oropharyngeal exudate or posterior oropharyngeal erythema.  Eyes:     Extraocular Movements: Extraocular movements intact.     Conjunctiva/sclera: Conjunctivae normal.     Pupils: Pupils are equal, round, and reactive to light.  Cardiovascular:     Rate and Rhythm: Normal rate and regular rhythm.     Heart sounds: No murmur heard. Pulmonary:     Effort: Pulmonary effort is normal. No respiratory distress.     Breath sounds: No stridor. No wheezing, rhonchi or rales.  Musculoskeletal:     Cervical back: Neck supple.  Lymphadenopathy:     Cervical: No cervical adenopathy.  Skin:    Capillary Refill: Capillary refill takes less than 2 seconds.     Coloration: Skin is not jaundiced or pale.  Neurological:     General: No focal deficit present.     Mental Status: She is alert and oriented to person, place, and time.  Psychiatric:  Behavior: Behavior normal.      UC Treatments / Results  Labs (all labs ordered are listed, but only abnormal results are displayed) Labs Reviewed  POC SOFIA SARS ANTIGEN FIA - Normal    EKG   Radiology No results found.  Procedures Procedures (including critical care time)  Medications Ordered in UC Medications - No data to display  Initial Impression / Assessment and Plan / UC Course  I have reviewed the  triage vital signs and the nursing notes.  Pertinent labs & imaging results that were available during my care of the patient were reviewed by me and considered in my medical decision making (see chart for details).     I mention to the patient that it would be best not to take ibuprofen with diclofenac .  Also mention that the diclofenac  could be a problem with her reduced kidney function.  COVID test is negative.  Omnicef  is sent in for OM and floxin is sent in for poss perforation/OE Final Clinical Impressions(s) / UC Diagnoses   Final diagnoses:  Viral URI  Acute otitis media with perforated tympanic membrane, right     Discharge Instructions      Testing for COVID was negative  Take cefdinir  300 mg--2 capsules together daily for 7 days  -Floxin eardrops-5 drops in the affected ear 2 times daily for 7 days.  Since your kidney function is reduced compared to normal, it is best to stick with Tylenol  as needed for any pain you are having.  Please follow-up with your primary care     ED Prescriptions     Medication Sig Dispense Auth. Provider   cefdinir  (OMNICEF ) 300 MG capsule Take 2 capsules (600 mg total) by mouth daily for 7 days. 14 capsule Elajah Kunsman K, MD   ofloxacin (FLOXIN) 0.3 % OTIC solution Place 5 drops into both ears 2 (two) times daily for 7 days. 5 mL Vonna Sharlet POUR, MD      PDMP not reviewed this encounter.   Vonna Sharlet POUR, MD 10/03/24 1659    Vonna Sharlet POUR, MD 10/03/24 [phone]

## 2024-10-03 NOTE — ED Triage Notes (Signed)
 C/O sore throat, sinus pressure & congestion, right ear drainage onset 5 days ago. Unsure if fevers. Denies cough. Has been taking IBU.

## 2024-10-07 ENCOUNTER — Other Ambulatory Visit: Payer: Self-pay

## 2024-10-07 DIAGNOSIS — F39 Unspecified mood [affective] disorder: Secondary | ICD-10-CM

## 2024-10-07 MED ORDER — FLUOXETINE HCL 40 MG PO CAPS: 40.0000 mg | ORAL_CAPSULE | Freq: Every day | ORAL | 3 refills | Status: AC

## 2024-10-07 NOTE — Telephone Encounter (Signed)
 Refill for  Fluoxetiine 40 mg LR 03/31/24, #90, 1 rf LOV  08/16/24 FOV  11/24/24  Please review and advise  Thanks. Dm/cma

## 2024-10-22 ENCOUNTER — Other Ambulatory Visit: Payer: Self-pay | Admitting: Family Medicine

## 2024-10-22 DIAGNOSIS — M1990 Unspecified osteoarthritis, unspecified site: Secondary | ICD-10-CM

## 2024-10-22 NOTE — Telephone Encounter (Signed)
 Refill request for Diclofenac  Sodium LR 09/26/24, #60 0rf LOV 08/18/24 FOV 11/24/24  Please review and advise.  Thanks. Dm/cma

## 2024-10-31 DIAGNOSIS — M170 Bilateral primary osteoarthritis of knee: Secondary | ICD-10-CM | POA: Diagnosis not present

## 2024-11-06 ENCOUNTER — Ambulatory Visit: Payer: Self-pay

## 2024-11-06 NOTE — Telephone Encounter (Signed)
 noted

## 2024-11-06 NOTE — Telephone Encounter (Signed)
 FYI Only or Action Required?: FYI only for provider: appointment scheduled on 12/15.  Patient was last seen in primary care on 08/18/2024 by Thedora Garnette HERO, MD.  Called Nurse Triage reporting Otalgia.  Symptoms began several weeks ago.  Interventions attempted: OTC medications: Tylenol .  Symptoms are: unchanged.  Triage Disposition: See HCP Within 4 Hours (Or PCP Triage)  Patient/caregiver understands and will follow disposition?: Yes          Copied from CRM #[id number]. Topic: Clinical - Red Word Triage >> Nov 06, 2024  8:04 AM Carlyon D wrote: Red Word that prompted transfer to Nurse Triage: Pain in both ears, drainage in the Right ear, pain in both sides of neck. Also lots of sinus pressure Reason for Disposition  [1] SEVERE pain (e.g., excruciating) and [2] not improved 2 hours after pain medicine (e.g., acetaminophen  or ibuprofen)  Answer Assessment - Initial Assessment Questions 1. LOCATION: Which ear is involved?     BIL ear   2. ONSET: When did the ear pain start?      A few weeks ago   3. SEVERITY: How bad is the pain?  (Scale 1-10; mild, moderate or severe)     Severe  4. URI SYMPTOMS: Do you have a runny nose or cough?     Sinus pressure, no cough  5. FEVER: Do you have a fever? If Yes, ask: What is your temperature, how was it measured, and when did it start?     Unsure    7. OTHER SYMPTOMS: Do you have any other symptoms? (e.g., decreased hearing, dizziness, headache, stiff neck, vomiting)  decreased hearing, dizziness, headache   Patient called in to triage with complaints of BIL ear pain This has been ongoing for a few weeks. The patient stated she has ear drainage that is yellow, and was seen in UC back in November. For home care, the patient is taking OTC Tylenol  Referred to UC for evaluation today. Patient refused saying she does not have the funds for an UC visit. An appt. Was offered in office for tomorrow 12/12; she declined  stated she can't make it in tomorrow. She was scheduled for 12/15 in office. She agrees with the plan of care, and will reach out if symptoms worsen or persist.  Protocols used: Rilla

## 2024-11-10 ENCOUNTER — Ambulatory Visit: Admitting: Family Medicine

## 2024-11-10 ENCOUNTER — Encounter: Payer: Self-pay | Admitting: Family Medicine

## 2024-11-10 VITALS — BP 126/70 | HR 71 | Ht 68.0 in | Wt 210.0 lb

## 2024-11-10 DIAGNOSIS — N1831 Chronic kidney disease, stage 3a: Secondary | ICD-10-CM | POA: Diagnosis not present

## 2024-11-10 DIAGNOSIS — H663X1 Other chronic suppurative otitis media, right ear: Secondary | ICD-10-CM | POA: Diagnosis not present

## 2024-11-10 DIAGNOSIS — H9191 Unspecified hearing loss, right ear: Secondary | ICD-10-CM | POA: Diagnosis not present

## 2024-11-10 DIAGNOSIS — Z794 Long term (current) use of insulin: Secondary | ICD-10-CM

## 2024-11-10 DIAGNOSIS — E1122 Type 2 diabetes mellitus with diabetic chronic kidney disease: Secondary | ICD-10-CM | POA: Diagnosis not present

## 2024-11-10 MED ORDER — OFLOXACIN 0.3 % OT SOLN: 5.0000 [drp] | Freq: Every day | OTIC | 0 refills | Status: DC

## 2024-11-10 MED ORDER — LEVOFLOXACIN 500 MG PO TABS: 500.0000 mg | ORAL_TABLET | Freq: Every day | ORAL | 0 refills | Status: AC

## 2024-11-10 NOTE — Progress Notes (Signed)
 Diagnoses and Orders:   1. Chronic suppurative otitis media of right ear, refractory to previous Abx, in patient with DM (A1c 7) and CKD3a   2. Type 2 diabetes mellitus with stage 3a chronic kidney disease, with long-term current use of insulin  (HCC)   3. Acute hearing loss, right    Meds ordered this encounter  Medications   ofloxacin  (FLOXIN ) 0.3 % OTIC solution    Sig: Place 5 drops into the right ear daily.    Dispense:  5 mL    Refill:  0   levofloxacin  (LEVAQUIN ) 500 MG tablet    Sig: Take 1 tablet (500 mg total) by mouth daily for 7 days.    Dispense:  7 tablet    Refill:  0   Orders Placed This Encounter  Procedures   Ambulatory referral to ENT   Assessment & Plan:   Refractory acute otitis media with decreased hearing; possible TM perforation. High-risk host (uncontrolled DM, CKD 3a). No systemic or neurologic red-flag symptoms. - Levofloxacin  500 mg q24h, continue ofloxacin  otic - Acetaminophen  scheduled PRN; warm compress to neck/jaw - Avoid systemic steroids given uncontrolled DM - Ear precautions: Strict dry ear; no water exposure, Q-tips, or instrumentation - Urgent outpatient ENT (24-48 h) for otoscopy  audiogram due to hearing loss and refractory course - Imaging: Not indicated at discharge given absence of fever, vertigo, CN deficit, or mastoid signs; low threshold for CT temporal bone if pain worsens or no improvement in 48 h - Diabetes: Reinforce close glucose monitoring during infection - Return precautions: Fever, worsening otalgia, neck pain/swelling, vertigo, facial weakness, persistent otorrhea, or progressive hearing loss  [name], DO, MS, FAAFP, Dipl. [facility] [address] [place], [address] Dept: [phone] Dept Fax: [phone]  Subjective:   History of Present Illness Samantha [name] is a 68 year old female with diabetes who presents with ear pain and hearing  loss.  Otalgia and hearing loss - Persistent ear pain and hearing loss for approximately 6 weeks - Pressure localized around the ear, eye, and sinus region - Pain and pressure worsen with biting down - Pain radiates to the ear, posterior neck, and down the arm - Required increased television volume over the past 6 weeks - Audible pop in the ear followed by severe pain and jaw pain - No vertigo or room spinning - Prior treatment with cefdinir  and ofloxacin  without improvement  Diabetes mellitus - Moderately controlled; on insulin   Renal function - Creatinine 1.2 mg/dL and GFR 48 fO/fpw/8.26f one year ago  Review of Systems: Negative, with the exception of above mentioned in HPI.  Medications:   Show/hide medication list[1]  Objective:   BP 126/70 (BP Location: Left Arm, Cuff Size: Normal)   Pulse 71   Ht 5' 8 (1.727 m)   Wt 210 lb (95.3 kg)   SpO2 96%   BMI 31.93 kg/m   Physical Exam Vitals reviewed.  HENT:     Ears:     Comments: Right TM with obscured landmarks/irregular contour, with debris on TM, otorrhea.    Nose: Nose normal.     Mouth/Throat:     Mouth: Mucous membranes are moist.  Eyes:     Extraocular Movements: Extraocular movements intact.     Conjunctiva/sclera: Conjunctivae normal.  Neurological:     General: No focal deficit present.        [1]  Outpatient Medications Prior to Visit  Medication Sig  albuterol  (VENTOLIN  HFA) 108 (90 Base) MCG/ACT inhaler Inhale 1-2 puffs into the lungs every 6 (six) hours as needed.   Calcium Citrate-Vitamin D  (CALCIUM + D PO) Take 1 tablet by mouth every morning.    cetirizine (ZYRTEC) 10 MG tablet Take 10 mg by mouth daily.   diclofenac  (VOLTAREN ) 75 MG EC tablet Take 1 tablet by mouth twice daily as needed   diclofenac  Sodium (VOLTAREN ) 1 % GEL Apply 2 g topically 4 (four) times daily.   FLUoxetine  (PROZAC ) 40 MG capsule Take 1 capsule (40 mg total) by mouth daily.   fluticasone  (FLONASE ) 50 MCG/ACT nasal  spray Place 2 sprays into both nostrils daily.   gabapentin  (NEURONTIN ) 300 MG capsule TAKE 1 CAPSULE BY MOUTH IN THE MORNING AND 2 IN THE EVENING   hydrochlorothiazide  (MICROZIDE ) 12.5 MG capsule Take 1 capsule (12.5 mg total) by mouth daily.   Insulin  Degludec FlexTouch 100 UNIT/ML SOPN Inject 14 Units into the skin at bedtime.   lovastatin  (MEVACOR ) 20 MG tablet Take 1 tablet (20 mg total) by mouth daily.   metFORMIN  (GLUCOPHAGE ) 1000 MG tablet TAKE 1 TABLET BY MOUTH TWICE DAILY WITH A MEAL   nitroGLYCERIN  (NITROSTAT ) 0.4 MG SL tablet Place 1 tablet (0.4 mg total) under the tongue every 5 (five) minutes as needed for chest pain.   pantoprazole  (PROTONIX ) 40 MG tablet Take 1 tablet by mouth once daily (Patient taking differently: Take 40 mg by mouth at bedtime.)   sucralfate  (CARAFATE ) 1 g tablet Take 1 tablet (1 g total) by mouth 3 (three) times daily.   SUMAtriptan (IMITREX) 50 MG tablet Take 50 mg by mouth.   valsartan  (DIOVAN ) 160 MG tablet Take 1 tablet (160 mg total) by mouth daily.   verapamil  (CALAN -SR) 240 MG CR tablet Take 1 tablet (240 mg total) by mouth daily.   No facility-administered medications prior to visit.

## 2024-11-22 ENCOUNTER — Other Ambulatory Visit: Payer: Self-pay | Admitting: Family

## 2024-11-22 DIAGNOSIS — M1990 Unspecified osteoarthritis, unspecified site: Secondary | ICD-10-CM

## 2024-11-24 ENCOUNTER — Ambulatory Visit: Payer: Self-pay | Admitting: Family Medicine

## 2024-11-24 ENCOUNTER — Encounter: Payer: Self-pay | Admitting: Family Medicine

## 2024-11-24 ENCOUNTER — Ambulatory Visit (INDEPENDENT_AMBULATORY_CARE_PROVIDER_SITE_OTHER): Admitting: Family Medicine

## 2024-11-24 VITALS — HR 74 | Temp 97.5°F | Ht 68.0 in | Wt 208.4 lb

## 2024-11-24 DIAGNOSIS — R809 Proteinuria, unspecified: Secondary | ICD-10-CM | POA: Diagnosis not present

## 2024-11-24 DIAGNOSIS — E1122 Type 2 diabetes mellitus with diabetic chronic kidney disease: Secondary | ICD-10-CM | POA: Diagnosis not present

## 2024-11-24 DIAGNOSIS — E1129 Type 2 diabetes mellitus with other diabetic kidney complication: Secondary | ICD-10-CM

## 2024-11-24 DIAGNOSIS — N1831 Chronic kidney disease, stage 3a: Secondary | ICD-10-CM | POA: Diagnosis not present

## 2024-11-24 DIAGNOSIS — M170 Bilateral primary osteoarthritis of knee: Secondary | ICD-10-CM | POA: Diagnosis not present

## 2024-11-24 DIAGNOSIS — Z794 Long term (current) use of insulin: Secondary | ICD-10-CM | POA: Diagnosis not present

## 2024-11-24 DIAGNOSIS — I10 Essential (primary) hypertension: Secondary | ICD-10-CM | POA: Diagnosis not present

## 2024-11-24 DIAGNOSIS — Z7984 Long term (current) use of oral hypoglycemic drugs: Secondary | ICD-10-CM

## 2024-11-24 DIAGNOSIS — E782 Mixed hyperlipidemia: Secondary | ICD-10-CM | POA: Diagnosis not present

## 2024-11-24 LAB — BASIC METABOLIC PANEL WITH GFR
BUN: 31 mg/dL — ABNORMAL HIGH (ref 6–23)
CO2: 28 meq/L (ref 19–32)
Calcium: 8.7 mg/dL (ref 8.4–10.5)
Chloride: 100 meq/L (ref 96–112)
Creatinine, Ser: 1.03 mg/dL (ref 0.40–1.20)
GFR: 55.01 mL/min — ABNORMAL LOW
Glucose, Bld: 120 mg/dL — ABNORMAL HIGH (ref 70–99)
Potassium: 4.2 meq/L (ref 3.5–5.1)
Sodium: 136 meq/L (ref 135–145)

## 2024-11-24 LAB — HEMOGLOBIN A1C: Hgb A1c MFr Bld: 7 % — ABNORMAL HIGH (ref 4.6–6.5)

## 2024-11-24 MED ORDER — DICLOFENAC SODIUM 75 MG PO TBEC: 75.0000 mg | DELAYED_RELEASE_TABLET | Freq: Two times a day (BID) | ORAL | 3 refills | Status: AC | PRN

## 2024-11-24 MED ORDER — DICLOFENAC SODIUM 75 MG PO TBEC: 75.0000 mg | DELAYED_RELEASE_TABLET | Freq: Two times a day (BID) | ORAL | 0 refills | Status: DC | PRN

## 2024-11-24 NOTE — Assessment & Plan Note (Signed)
 Continue metformin  1,000 mg bid, insulin  degludec 14 units sq daily.

## 2024-11-24 NOTE — Progress Notes (Signed)
 " [facility] [address] [place] [address] Dept: [phone] Dept Fax: [phone]  Chronic Care Office Visit  Subjective:    Patient ID: Samantha [name], female    DOB: 05/06/1956, 68 y.o..   MRN: [id number]  Chief Complaint  Patient presents with   Diabetes    3 month f/u DM/HTN.   No concerns.  Fasting average 120's BS.     History of Present Illness:  Patient is in today for reassessment of chronic medical conditions.   Ms. Mullens has Type 2 diabetes diagnosed in 2016. This is complicated with microalbuminuria and neuropathy. She is managed on metformin  1,000 mg bid, insulin  degludec 14 units sq daily. Her neuropathy is managed with gabapentin  300 mg 1 cap each morning and 2 caps each evening.   Ms. Fennema has hypertension. She is managed on HCTZ 12.5 mg daily, valsartan  160 mg daily, and verapamil  240 mg daily.   Ms. Mcghee has hyperlipidemia. She is managed on lovastatin  20 mg daily.  Ms. Garlington has bilateral OA of the knees. She was seen by Dr. Josefina (orthopedics) since her last visit. He has performed gel joint injections. She does feel this has helped her get up and around better. She had tried switching from diclofenac  to Tylenol  Arthritis, but the Tylenol  upset her stomach.  Past Medical History: Patient Active Problem List   Diagnosis Date Noted   Chronic pain of left knee 08/18/2024   Migraine headache 02/04/2024   Stage 3a chronic kidney disease (CKD) (HCC) 11/01/2023   Type 2 diabetes mellitus with diabetic chronic kidney disease (HCC) 11/01/2023   Diabetic neuropathy (HCC) 10/31/2023   Allergic rhinitis 10/31/2023   Diverticulosis 10/23/2023   Syncope 10/07/2023   Neuroma of foot 02/20/2022   Unstable angina (HCC)    Arthritis 01/14/2021   History of colonic polyps 01/14/2021   Hyperlipidemia 02/23/2020   Microalbuminuria due to type 2 diabetes mellitus (HCC) 02/23/2020   B12  deficiency 02/23/2020   Vitamin D  deficiency 02/23/2020   Generalized anxiety disorder 02/23/2020   Degenerative disc disease, lumbar 07/19/2018   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   Spondylosis of lumbar spine 07/19/2018   Lumbar radiculopathy 07/05/2018   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/21/2018   Eustachian tube dysfunction, bilateral 02/21/2018   Recurrent sinusitis 02/21/2018   GERD (gastroesophageal reflux disease) 10/24/2017   Pulmonary nodule, right 08/14/2017   Obesity (BMI 30-39.9) 06/11/2017   LBBB (left bundle branch block) 08/10/2016   Leukocytosis 08/10/2016   Essential hypertension 08/10/2016   Type 2 diabetes mellitus with diabetic neuropathy, with long-term current use of insulin  (HCC) 11/20/2015   Past Surgical History:  Procedure Laterality Date   CATARACT EXTRACTION W/PHACO Left 07/11/2022   Procedure: CATARACT EXTRACTION PHACO AND INTRAOCULAR LENS PLACEMENT (IOC) LEFT  DIABETIC;  Surgeon: Jaye Fallow, MD;  Location: [facility];  Service: Ophthalmology;  Laterality: Left;  3.60 0:22.7   CATARACT EXTRACTION W/PHACO Right 07/25/2022   Procedure: CATARACT EXTRACTION PHACO AND INTRAOCULAR LENS PLACEMENT (IOC) RIGHT DIABETIC 5.12 00:33.0;  Surgeon: Jaye Fallow, MD;  Location: [facility];  Service: Ophthalmology;  Laterality: Right;   KNEE ARTHROSCOPY Right 2017   LEFT HEART CATH AND CORONARY ANGIOGRAPHY N/A 06/30/2021   Procedure: LEFT HEART CATH AND CORONARY ANGIOGRAPHY;  Surgeon: Burnard Debby LABOR, MD;  Location: [facility];  Service: Cardiovascular;  Laterality: N/A;   MIDDLE EAR SURGERY Right  TONSILLECTOMY AND ADENOIDECTOMY  1962   Family History  Problem Relation Age of Onset   Diabetes Mother    Hypertension Mother    CAD Mother    Hypertension Father    Diabetes Father    CAD Father    Diabetes Sister    Hypertension Sister    Diabetes Brother     Hyperlipidemia Brother    Hypertension Brother    CAD Brother    Cancer Paternal Uncle        Brain   Cancer Paternal Grandmother        Colon   Kidney disease Paternal Grandfather    Outpatient Medications Prior to Visit  Medication Sig Dispense Refill   albuterol  (VENTOLIN  HFA) 108 (90 Base) MCG/ACT inhaler Inhale 1-2 puffs into the lungs every 6 (six) hours as needed. 8 g 0   Calcium Citrate-Vitamin D  (CALCIUM + D PO) Take 1 tablet by mouth every morning.      cetirizine (ZYRTEC) 10 MG tablet Take 10 mg by mouth daily.     diclofenac  (VOLTAREN ) 75 MG EC tablet Take 1 tablet by mouth twice daily as needed 60 tablet 0   diclofenac  Sodium (VOLTAREN ) 1 % GEL Apply 2 g topically 4 (four) times daily. 100 g 3   FLUoxetine  (PROZAC ) 40 MG capsule Take 1 capsule (40 mg total) by mouth daily. 90 capsule 3   fluticasone  (FLONASE ) 50 MCG/ACT nasal spray Place 2 sprays into both nostrils daily. 16 g 6   gabapentin  (NEURONTIN ) 300 MG capsule TAKE 1 CAPSULE BY MOUTH IN THE MORNING AND 2 IN THE EVENING 270 capsule 3   hydrochlorothiazide  (MICROZIDE ) 12.5 MG capsule Take 1 capsule (12.5 mg total) by mouth daily. 90 capsule 3   Insulin  Degludec FlexTouch 100 UNIT/ML SOPN Inject 14 Units into the skin at bedtime. 420 mL 3   lovastatin  (MEVACOR ) 20 MG tablet Take 1 tablet (20 mg total) by mouth daily. 90 tablet 3   metFORMIN  (GLUCOPHAGE ) 1000 MG tablet TAKE 1 TABLET BY MOUTH TWICE DAILY WITH A MEAL 180 tablet 3   nitroGLYCERIN  (NITROSTAT ) 0.4 MG SL tablet Place 1 tablet (0.4 mg total) under the tongue every 5 (five) minutes as needed for chest pain. 30 tablet 12   ofloxacin  (FLOXIN ) 0.3 % OTIC solution Place 5 drops into the right ear daily. 5 mL 0   pantoprazole  (PROTONIX ) 40 MG tablet Take 1 tablet by mouth once daily (Patient taking differently: Take 40 mg by mouth at bedtime.) 90 tablet 0   sucralfate  (CARAFATE ) 1 g tablet Take 1 tablet (1 g total) by mouth 3 (three) times daily. 90 tablet 0    SUMAtriptan (IMITREX) 50 MG tablet Take 50 mg by mouth.     valsartan  (DIOVAN ) 160 MG tablet Take 1 tablet (160 mg total) by mouth daily. 90 tablet 3   verapamil  (CALAN -SR) 240 MG CR tablet Take 1 tablet (240 mg total) by mouth daily. 90 tablet 3   No facility-administered medications prior to visit.   Allergies[1]   Objective:   Today's Vitals   11/24/24 0937  Pulse: 74  Temp: (!) 97.5 F (36.4 C)  TempSrc: Temporal  SpO2: 97%  Weight: 208 lb 6.4 oz (94.5 kg)  Height: 5' 8 (1.727 m)   Body mass index is 31.69 kg/m.   General: Well developed, well nourished. No acute distress. Extremities: Bilateral knee joint enlargement. No swelling, increased heat, or redness. Psych: Alert and oriented. Normal mood and affect.  Health Maintenance Due  Topic Date Due   COVID-19 Vaccine (5 - 2025-26 season) 07/28/2024   Diabetic kidney evaluation - eGFR measurement  11/11/2024   Imaging MM 3D SCREENING MAMMOGRAM BILATERAL BREAST Result Date: 09/25/2024 IMPRESSION: No mammographic evidence of malignancy.  BI-RADS CATEGORY  1: Negative.   Lab Results Lab Results  Component Value Date   HGBA1C 7.2 (H) 08/18/2024   HGBA1C 6.7 (H) 05/12/2024   HGBA1C 7.5 (H) 02/04/2024     Assessment & Plan:   Problem List Items Addressed This Visit       Cardiovascular and Mediastinum   Essential hypertension   Blood pressure is in good control. Continue HCTZ 12.5 mg daily, valsartan  160 mg daily, and verapamil  240 mg daily.        Endocrine   Microalbuminuria due to type 2 diabetes mellitus (HCC)   Type 2 diabetes mellitus with diabetic chronic kidney disease (HCC) - Primary   Continue metformin  1,000 mg bid, insulin  degludec 14 units sq daily.       Relevant Orders   Basic metabolic panel with GFR   Hemoglobin A1c     Musculoskeletal and Integument   Bilateral primary osteoarthritis of knee   Improved with gel injection of knee joints. Continue diclofenac  for pain management as  needed.      Relevant Medications   diclofenac  (VOLTAREN ) 75 MG EC tablet     Genitourinary   Stage 3a chronic kidney disease (CKD) (HCC)   Continue focus on blood pressure and glucose control, adequate hydration, and avoidance of nephrotoxic medications. Continue ARB. I will reassess reanl labs today.       Relevant Orders   Basic metabolic panel with GFR     Other   Hyperlipidemia   Lipids at goal. Continue lovastatin  20 mg daily.      Other Visit Diagnoses       Encounter for long-term (current) insulin  use (HCC)         Long term current use of oral hypoglycemic drug          Return in about 3 months (around 02/22/2025) for Reassessment.   Garnette CHRISTELLA Simpler, MD  I,Emily Lagle,acting as a scribe for Garnette CHRISTELLA Simpler, MD.,have documented all relevant documentation on the behalf of Garnette CHRISTELLA Simpler, MD.  I, Garnette CHRISTELLA Simpler, MD, have reviewed all documentation for this visit. The documentation on 11/24/2024 for the exam, diagnosis, procedures, and orders are all accurate and complete.     [1]  Allergies Allergen Reactions   Iohexol  Anaphylaxis     Code: SOB, Desc: respiratory distress-ASM, Onset Date: [id number]    Iodinated Contrast Media Other (See Comments)   Jardiance [Empagliflozin] Rash   "

## 2024-11-24 NOTE — Assessment & Plan Note (Signed)
 Lipids at goal. Continue lovastatin 20 mg daily.

## 2024-11-24 NOTE — Assessment & Plan Note (Addendum)
 Continue focus on blood pressure and glucose control, adequate hydration, and avoidance of nephrotoxic medications. Continue ARB. I will reassess reanl labs today.

## 2024-11-24 NOTE — Assessment & Plan Note (Deleted)
 Continue focus on blood pressure and glucose control, adequate hydration, and avoidance of nephrotoxic medications. Continue ARB. I will reassess reanl labs today.

## 2024-11-24 NOTE — Assessment & Plan Note (Signed)
 Improved with gel injection of knee joints. Continue diclofenac  for pain management as needed.

## 2024-11-24 NOTE — Assessment & Plan Note (Addendum)
 Blood pressure is in good control. Continue HCTZ 12.5 mg daily, valsartan 160 mg daily, and verapamil 240 mg daily.

## 2024-11-24 NOTE — Addendum Note (Signed)
 Addended by: THEDORA GARNETTE HERO on: 11/24/2024 10:56 AM   Modules accepted: Orders

## 2024-12-01 ENCOUNTER — Ambulatory Visit (INDEPENDENT_AMBULATORY_CARE_PROVIDER_SITE_OTHER): Admitting: Physician Assistant

## 2024-12-01 ENCOUNTER — Encounter (INDEPENDENT_AMBULATORY_CARE_PROVIDER_SITE_OTHER): Payer: Self-pay | Admitting: Physician Assistant

## 2024-12-01 VITALS — BP 159/75 | HR 77 | Ht 70.0 in | Wt 208.0 lb

## 2024-12-01 DIAGNOSIS — H906 Mixed conductive and sensorineural hearing loss, bilateral: Secondary | ICD-10-CM | POA: Diagnosis not present

## 2024-12-01 DIAGNOSIS — J329 Chronic sinusitis, unspecified: Secondary | ICD-10-CM

## 2024-12-01 DIAGNOSIS — H6993 Unspecified Eustachian tube disorder, bilateral: Secondary | ICD-10-CM | POA: Diagnosis not present

## 2024-12-01 MED ORDER — FLUTICASONE PROPIONATE 50 MCG/ACT NA SUSP: 2.0000 | Freq: Every day | NASAL | 6 refills | Status: AC

## 2024-12-01 NOTE — Progress Notes (Signed)
 Patient explains that she forgot her BP medication today.

## 2024-12-02 ENCOUNTER — Encounter (INDEPENDENT_AMBULATORY_CARE_PROVIDER_SITE_OTHER): Payer: Self-pay

## 2024-12-02 NOTE — Progress Notes (Signed)
 Dear Dr. Prentiss, Here is my assessment for our mutual patient, Samantha [name]. Thank you for allowing me the opportunity to care for your patient. Please do not hesitate to contact me should you have any other questions. Sincerely, Chyrl Cohen PA-C  Otolaryngology Clinic Note Referring provider: Dr. Prentiss HPI:  Samantha [name] is a 69 y.o. female kindly referred by Dr. Prentiss   Discussed the use of AI scribe software for clinical note transcription with the patient, who gave verbal consent to proceed.  History of Present Illness    CLOEY [name] is a 69 year old female with chronic otitis media, Eustachian tube dysfunction, and mixed hearing loss who presents with acute right ear pain and headache.  She developed acute onset of severe right-sided otalgia and headache this morning, with pain localized to the right ear and adjacent head region. The pain is exacerbated by palpation. Associated symptoms include sinus pain, sensation of fluid in the right ear, and muffled hearing on the right. Lying on the right side worsens the muffled hearing, while lying on the left side improves it. She also notes intermittent sensation of 'blood rushing' in the right ear. There is no radiation of pain. She experiences intermittent sore throat, which is present today. She denies fever, noting her baseline temperature is typically 96-51F and considers 57F a fever for her.  She has a lifelong history of recurrent otitis media and sinus disease, with initial tympanostomy tube placement at age 29 due to persistent infections. Multiple sets of ear tubes have been placed, most recently approximately fifteen years ago, bilaterally. Tubes provided relief of pressure and pain, but infections persisted. She continues to experience frequent ear infections, with approximately four episodes treated in the past year. Infections are often preceded by sinus pressure and congestion, without typical upper  respiratory symptoms. She reports that she is unable to 'pop' her ears to equalize pressure, even with altitude changes.  Recent treatment for right ear infection included oral levofloxacin  and ofloxacin  otic drops in December 2025, following an episode of yellow, sticky otorrhea from the right ear. She was evaluated by Dr. Prentiss on November 10, 2024, and Dr. Brenita on November 24, 2024. Hearing was significantly diminished in early December, requiring increased TV volume, but improved somewhat after treatment. Currently, hearing remains muffled, especially on the right.           Independent Review of Additional Tests or Records:  PCP notes 11/24/2024, 11/10/2024   PMH/Meds/All/SocHx/FamHx/ROS:   Past Medical History:  Diagnosis Date   Arthritis    Dyslipidemia    Heart disease    Heart murmur 2011   Left bundle branch block 2011   Type II or unspecified type diabetes mellitus without mention of complication, not stated as uncontrolled    Unspecified essential hypertension      Past Surgical History:  Procedure Laterality Date   CATARACT EXTRACTION W/PHACO Left 07/11/2022   Procedure: CATARACT EXTRACTION PHACO AND INTRAOCULAR LENS PLACEMENT (IOC) LEFT  DIABETIC;  Surgeon: Jaye Fallow, MD;  Location: [facility];  Service: Ophthalmology;  Laterality: Left;  3.60 0:22.7   CATARACT EXTRACTION W/PHACO Right 07/25/2022   Procedure: CATARACT EXTRACTION PHACO AND INTRAOCULAR LENS PLACEMENT (IOC) RIGHT DIABETIC 5.12 00:33.0;  Surgeon: Jaye Fallow, MD;  Location: [facility];  Service: Ophthalmology;  Laterality: Right;   KNEE ARTHROSCOPY Right 2017   LEFT HEART CATH AND CORONARY ANGIOGRAPHY N/A 06/30/2021   Procedure: LEFT HEART CATH AND CORONARY ANGIOGRAPHY;  Surgeon: Burnard Debby LABOR,  MD;  Location: [facility];  Service: Cardiovascular;  Laterality: N/A;   MIDDLE EAR SURGERY Right    TONSILLECTOMY AND ADENOIDECTOMY  1962    Family History  Problem  Relation Age of Onset   Diabetes Mother    Hypertension Mother    CAD Mother    Hypertension Father    Diabetes Father    CAD Father    Diabetes Sister    Hypertension Sister    Diabetes Brother    Hyperlipidemia Brother    Hypertension Brother    CAD Brother    Cancer Paternal Uncle        Brain   Cancer Paternal Grandmother        Colon   Kidney disease Paternal Grandfather      Social Connections: Moderately Integrated (03/21/2024)   Social Connection and Isolation Panel    Frequency of Communication with Friends and Family: More than three times a week    Frequency of Social Gatherings with Friends and Family: Three times a week    Attends Religious Services: 1 to 4 times per year    Active Member of Clubs or Organizations: No    Attends Banker Meetings: Never    Marital Status: Married     Current Medications[1]   Physical Exam:   BP (!) 159/75   Pulse 77   Ht 5' 10 (1.778 m)   Wt 208 lb (94.3 kg)   SpO2 95%   BMI 29.84 kg/m   Pertinent Findings  CN II-XII grossly intact Right TM retraction, no obvious middle ear effusion, left TM with minimal retraction, no effusion Weber 512: Localized left Rinne 512: AC > BC b/l  Anterior rhinoscopy: Septum midline; bilateral inferior turbinates with no hypertrophy No lesions of oral cavity/oropharynx No obviously palpable neck masses/lymphadenopathy/thyromegaly No respiratory distress or stridor   Seprately Identifiable Procedures:  None  Impression & Plans:  [name] is a 69 y.o. female with the following   Assessment and Plan    Eustachian tube dysfunction, bilateral Chronic bilateral Eustachian tube dysfunction with right tympanic membrane retraction and left-sided scarring, consistent with negative middle ear pressure. No acute infection. Prior tympanostomy tubes provided symptomatic relief.  - Ordered hearing test to evaluate dysfunction and hearing loss. - Prescribed daily Flonase   nasal spray. - Advised continued use of oral antihistamine (Claritin or Zyrtec). - Discussed possible future tympanostomy tube placement if significant retraction and symptoms persist.  Mixed conductive and sensorineural hearing loss, bilateral Persistent bilateral mixed hearing loss, more severe on the right, likely due to chronic Eustachian tube dysfunction, prior infections, and tympanostomy tube placements. Bedside tuning fork tests confirm mixed deficits. - Ordered formal audiometry to assess degree and type of hearing loss.  Chronic rhinosinusitis Chronic sinus pressure and pain with intermittent exacerbations. Current symptoms include pain and discomfort, with a sensation of fullness and history of recurrent ear infections. - Prescribed daily Flonase  nasal spray. - Advised continued use of oral antihistamine (Claritin or Zyrtec). - Recommended saline sinus irrigation.           - f/u phone call visit with audiological results   Thank you for allowing me the opportunity to care for your patient. Please do not hesitate to contact me should you have any other questions.  Sincerely, Chyrl Cohen PA-C [facility] Phone: [phone] Fax: [phone]  12/02/2024, 11:54 AM        [1]  Current Outpatient Medications:    albuterol  (VENTOLIN  HFA)  108 (90 Base) MCG/ACT inhaler, Inhale 1-2 puffs into the lungs every 6 (six) hours as needed., Disp: 8 g, Rfl: 0   Calcium Citrate-Vitamin D  (CALCIUM + D PO), Take 1 tablet by mouth every morning. , Disp: , Rfl:    cetirizine (ZYRTEC) 10 MG tablet, Take 10 mg by mouth daily., Disp: , Rfl:    diclofenac  (VOLTAREN ) 75 MG EC tablet, Take 1 tablet (75 mg total) by mouth 2 (two) times daily as needed., Disp: 60 tablet, Rfl: 3   diclofenac  Sodium (VOLTAREN ) 1 % GEL, Apply 2 g topically 4 (four) times daily., Disp: 100 g, Rfl: 3   FLUoxetine  (PROZAC ) 40 MG capsule, Take 1 capsule (40 mg total) by mouth daily., Disp: 90 capsule,  Rfl: 3   fluticasone  (FLONASE ) 50 MCG/ACT nasal spray, Place 2 sprays into both nostrils daily., Disp: 16 g, Rfl: 6   fluticasone  (FLONASE ) 50 MCG/ACT nasal spray, Place 2 sprays into both nostrils daily., Disp: 16 g, Rfl: 6   gabapentin  (NEURONTIN ) 300 MG capsule, TAKE 1 CAPSULE BY MOUTH IN THE MORNING AND 2 IN THE EVENING, Disp: 270 capsule, Rfl: 3   hydrochlorothiazide  (MICROZIDE ) 12.5 MG capsule, Take 1 capsule (12.5 mg total) by mouth daily., Disp: 90 capsule, Rfl: 3   Insulin  Degludec FlexTouch 100 UNIT/ML SOPN, Inject 14 Units into the skin at bedtime., Disp: 420 mL, Rfl: 3   lovastatin  (MEVACOR ) 20 MG tablet, Take 1 tablet (20 mg total) by mouth daily., Disp: 90 tablet, Rfl: 3   metFORMIN  (GLUCOPHAGE ) 1000 MG tablet, TAKE 1 TABLET BY MOUTH TWICE DAILY WITH A MEAL, Disp: 180 tablet, Rfl: 3   nitroGLYCERIN  (NITROSTAT ) 0.4 MG SL tablet, Place 1 tablet (0.4 mg total) under the tongue every 5 (five) minutes as needed for chest pain., Disp: 30 tablet, Rfl: 12   pantoprazole  (PROTONIX ) 40 MG tablet, Take 1 tablet by mouth once daily, Disp: 90 tablet, Rfl: 0   sucralfate  (CARAFATE ) 1 g tablet, Take 1 tablet (1 g total) by mouth 3 (three) times daily., Disp: 90 tablet, Rfl: 0   SUMAtriptan (IMITREX) 50 MG tablet, Take 50 mg by mouth., Disp: , Rfl:    valsartan  (DIOVAN ) 160 MG tablet, Take 1 tablet (160 mg total) by mouth daily., Disp: 90 tablet, Rfl: 3   verapamil  (CALAN -SR) 240 MG CR tablet, Take 1 tablet (240 mg total) by mouth daily., Disp: 90 tablet, Rfl: 3

## 2024-12-09 ENCOUNTER — Ambulatory Visit: Payer: Self-pay

## 2024-12-09 ENCOUNTER — Encounter: Payer: Self-pay | Admitting: Family Medicine

## 2024-12-09 ENCOUNTER — Ambulatory Visit: Admitting: Family Medicine

## 2024-12-09 VITALS — BP 136/74 | HR 76 | Ht 70.0 in | Wt 201.8 lb

## 2024-12-09 DIAGNOSIS — R6889 Other general symptoms and signs: Secondary | ICD-10-CM

## 2024-12-09 DIAGNOSIS — R519 Headache, unspecified: Secondary | ICD-10-CM

## 2024-12-09 DIAGNOSIS — N1831 Chronic kidney disease, stage 3a: Secondary | ICD-10-CM

## 2024-12-09 DIAGNOSIS — R6883 Chills (without fever): Secondary | ICD-10-CM | POA: Diagnosis not present

## 2024-12-09 DIAGNOSIS — R059 Cough, unspecified: Secondary | ICD-10-CM | POA: Diagnosis not present

## 2024-12-09 DIAGNOSIS — E1122 Type 2 diabetes mellitus with diabetic chronic kidney disease: Secondary | ICD-10-CM

## 2024-12-09 DIAGNOSIS — J320 Chronic maxillary sinusitis: Secondary | ICD-10-CM | POA: Diagnosis not present

## 2024-12-09 LAB — POC COVID19 BINAXNOW: SARS Coronavirus 2 Ag: NEGATIVE

## 2024-12-09 LAB — POCT INFLUENZA A/B
Influenza A, POC: NEGATIVE
Influenza B, POC: NEGATIVE

## 2024-12-09 MED ORDER — OFLOXACIN 0.3 % OT SOLN: 5.0000 [drp] | Freq: Every day | OTIC | 0 refills | Status: AC

## 2024-12-09 MED ORDER — LEVOFLOXACIN 500 MG PO TABS: 500.0000 mg | ORAL_TABLET | Freq: Every day | ORAL | 0 refills | Status: AC

## 2024-12-09 MED ORDER — PREDNISONE 5 MG PO TABS: ORAL_TABLET | ORAL | 0 refills | Status: AC

## 2024-12-09 NOTE — Telephone Encounter (Signed)
 FYI Only or Action Required?: FYI only for provider: appointment scheduled on 12/09/2024.  Patient was last seen in primary care on 11/24/2024 by Thedora Garnette HERO, MD.  Called Nurse Triage reporting Cough, Sore Throat, Dizziness, Otalgia, Nasal Congestion, and Facial Pain.  Symptoms began a week ago.  Interventions attempted: OTC medications: patient cannot remember which one.  Symptoms are: unchanged.  Triage Disposition: See Physician Within 24 Hours  Patient/caregiver understands and will follow disposition?: Yes  Copied from CRM [id number]. Topic: Clinical - Red Word Triage >> Dec 09, 2024 10:22 AM Drema MATSU wrote: Red Word that prompted transfer to Nurse Triage: Patient ears and throat hurts, blood in mucous, dizzy, and has a headache. She has been sick for over a week.  *prefer to see Dr. Thedora or Dr. Prentiss Reason for Disposition  [1] Sinus pain (not just congestion) AND [2] fever  Answer Assessment - Initial Assessment Questions 1. LOCATION: Where does it hurt?      Forehead and face 2. ONSET: When did the sinus pain start?  (e.g., hours, days)      One week ago 3. SEVERITY: How bad is the pain?   (Scale 0-10; or none, mild, moderate or severe)     9/10  5. NASAL CONGESTION: Is the nose blocked? If Yes, ask: Can you open it or must you breathe through your mouth?     Congested and runny 6. NASAL DISCHARGE: Do you have discharge from your nose? If so ask, What color?     green 7. FEVER: Do you have a fever? If Yes, ask: What is it, how was it measured, and when did it start?      denies 8. OTHER SYMPTOMS: Do you have any other symptoms? (e.g., sore throat, cough, earache, difficulty breathing)     Cough and sore throat, dizziness  Protocols used: Sinus Pain or Congestion-A-AH

## 2024-12-09 NOTE — Telephone Encounter (Signed)
 Patient seeing Dr Prentiss today. Dm/cma

## 2024-12-10 ENCOUNTER — Encounter: Payer: Self-pay | Admitting: Family Medicine

## 2024-12-10 NOTE — Progress Notes (Signed)
 "    Patient Care Team: Thedora Garnette HERO, MD as PCP - General (Family Medicine) Shelah Lamar RAMAN, MD as Consulting Physician (Pulmonary Disease) [facility] Rollin Dover, MD as Consulting Physician (Gastroenterology) [facility], [facility] (Optometry) [facility] Blair Mt, MD as Referring Physician (Otolaryngology) Maree Jannett POUR, MD as Consulting Physician (Neurology) Nahser, Aleene PARAS, MD (Inactive) as Consulting Physician (Cardiology)  Diagnoses and Orders:   1. Chronic maxillary sinusitis   2. Flu-like symptoms    Meds ordered this encounter  Medications   levofloxacin  (LEVAQUIN ) 500 MG tablet    Sig: Take 1 tablet (500 mg total) by mouth daily for 10 days.    Dispense:  10 tablet    Refill:  0   predniSONE  (DELTASONE ) 5 MG tablet    Sig: 6-6-5-5-4-4-3-3-2-2-1-1-OFF    Dispense:  42 tablet    Refill:  0   ofloxacin  (FLOXIN ) 0.3 % OTIC solution    Sig: Place 5 drops into the right ear daily.    Dispense:  5 mL    Refill:  0   Orders Placed This Encounter  Procedures   POC COVID-19 BinaxNow   POCT Influenza A/B   Assessment & Plan:   Assessment & Plan Chronic sinusitis with acute exacerbation Recurrent sinusitis with recent exacerbation. Likely viral. ENT noted closed eustachian tubes. Chronic condition may need prolonged treatment. - Prescribed Levaquin  (levofloxacin ) for 10 days. - Prescribed prednisone  taper for 12 days. - Advised monitoring blood sugars closely due to prednisone . - Encouraged hydration.  Chronic suppurative otitis media of right ear Chronic otitis media with recent exacerbation. ENT suggested possible ear tubes post-hearing test. - Continue ofloxacin  ear drops in right ear. - Coordinated with ENT for further management post-hearing test.  Type 2 diabetes mellitus with stage 3a chronic kidney disease A1c at goal. Blood sugar may increase with prednisone . Kidney function stable. -  Monitor blood sugars closely with prednisone  use. - Advised to report blood sugars consistently above 200 mg/dL.  Samantha Shutter, DO, MS, FAAFP, Dipl. [facility] [address] [place], [address] Dept: [phone] Dept Fax: [phone]  Subjective:   History of Present Illness Samantha [name] is a 69 year old female with recurrent otitis and sinus disease who presents with worsening sinusitis symptoms.  Sinusitis symptoms - Worsening sinus symptoms over the past week - Green nasal discharge - Cough productive of green sputum, with red streaks noted this morning - Diffuse head pain - No fever - Chills present - Recent flu and COVID tests negative - Using Flonase  daily - Past antibiotic treatments have typically improved symptoms within 3 to 4 days  Otalgia and ear symptoms - Increased right ear pain - Sensation of 'marching' in the ears - History of recurrent otitis - Previously treated with ofloxacin  ear drops for right ear  Glycemic control - A1c was 7 two weeks ago - Home blood sugars have stayed below 200 - Aware that illness can raise blood sugar levels  Blood pressure and renal function - Blood pressure was higher at home this morning - Kidney function checked two weeks ago was normal  Hydration status - Concerned about dehydration due to prior episode of syncope - Drinking water regularly to prevent dehydration  Prior treatments - Previously treated with Levaquin  and prednisone  for sinus and ear symptoms  Review of Systems: Negative, with the exception of above mentioned in HPI.  History:   Reviewed by clinician on  day of visit: allergies, medications, problem list, medical history, surgical history, family history, social history, and previous encounter notes.  Medications:   Show/hide medication list[1] Allergies[2]  Objective:   BP 136/74 (BP Location: Right Arm, Cuff Size: Normal)   Pulse  76   Ht 5' 10 (1.778 m)   Wt 201 lb 12.8 oz (91.5 kg)   SpO2 97%   BMI 28.96 kg/m   Physical Exam Vitals reviewed.  Constitutional:      Appearance: She is ill-appearing.  HENT:     Head: Normocephalic.     Nose: Congestion present.     Right Turbinates: Swollen.     Left Turbinates: Swollen.     Right Sinus: Maxillary sinus tenderness present.     Left Sinus: Maxillary sinus tenderness present.     Mouth/Throat:     Mouth: Mucous membranes are moist.  Skin:    Findings: No rash.  Neurological:     General: No focal deficit present.    Results for orders placed or performed in visit on 12/09/24  POC COVID-19 BinaxNow   Collection Time: 12/09/24  2:53 PM  Result Value Ref Range   SARS Coronavirus 2 Ag Negative Negative  POCT Influenza A/B   Collection Time: 12/09/24  2:53 PM  Result Value Ref Range   Influenza A, POC Negative Negative   Influenza B, POC Negative Negative    Attestations:   Samantha Shutter, DO, MS, FAAFP, Dipl. [facility] [address] [place], [address] Dept: [phone] Dept Fax: [phone]    [1]  Outpatient Medications Prior to Visit  Medication Sig   albuterol  (VENTOLIN  HFA) 108 (90 Base) MCG/ACT inhaler Inhale 1-2 puffs into the lungs every 6 (six) hours as needed.   Calcium Citrate-Vitamin D  (CALCIUM + D PO) Take 1 tablet by mouth every morning.    cetirizine (ZYRTEC) 10 MG tablet Take 10 mg by mouth daily.   diclofenac  (VOLTAREN ) 75 MG EC tablet Take 1 tablet (75 mg total) by mouth 2 (two) times daily as needed.   diclofenac  Sodium (VOLTAREN ) 1 % GEL Apply 2 g topically 4 (four) times daily.   FLUoxetine  (PROZAC ) 40 MG capsule Take 1 capsule (40 mg total) by mouth daily.   fluticasone  (FLONASE ) 50 MCG/ACT nasal spray Place 2 sprays into both nostrils daily.   fluticasone  (FLONASE ) 50 MCG/ACT nasal spray Place 2 sprays into both nostrils daily.   gabapentin  (NEURONTIN ) 300 MG capsule  TAKE 1 CAPSULE BY MOUTH IN THE MORNING AND 2 IN THE EVENING   hydrochlorothiazide  (MICROZIDE ) 12.5 MG capsule Take 1 capsule (12.5 mg total) by mouth daily.   Insulin  Degludec FlexTouch 100 UNIT/ML SOPN Inject 14 Units into the skin at bedtime.   lovastatin  (MEVACOR ) 20 MG tablet Take 1 tablet (20 mg total) by mouth daily.   metFORMIN  (GLUCOPHAGE ) 1000 MG tablet TAKE 1 TABLET BY MOUTH TWICE DAILY WITH A MEAL   nitroGLYCERIN  (NITROSTAT ) 0.4 MG SL tablet Place 1 tablet (0.4 mg total) under the tongue every 5 (five) minutes as needed for chest pain.   pantoprazole  (PROTONIX ) 40 MG tablet Take 1 tablet by mouth once daily   sucralfate  (CARAFATE ) 1 g tablet Take 1 tablet (1 g total) by mouth 3 (three) times daily.   SUMAtriptan (IMITREX) 50 MG tablet Take 50 mg by mouth.   valsartan  (DIOVAN ) 160 MG tablet Take 1 tablet (160 mg total) by mouth daily.   verapamil  (CALAN -SR) 240 MG CR tablet Take  1 tablet (240 mg total) by mouth daily.   No facility-administered medications prior to visit.  [2]  Allergies Allergen Reactions   Iohexol  Anaphylaxis     Code: SOB, Desc: respiratory distress-ASM, Onset Date: 90717989    Iodinated Contrast Media Other (See Comments)   Jardiance [Empagliflozin] Rash   "

## 2024-12-17 ENCOUNTER — Other Ambulatory Visit: Payer: Self-pay | Admitting: Family Medicine

## 2024-12-17 DIAGNOSIS — E1169 Type 2 diabetes mellitus with other specified complication: Secondary | ICD-10-CM

## 2024-12-17 NOTE — Telephone Encounter (Signed)
 Refill request for Gabapentin   LR 10/31/23, #270, 3 rf  Metformin   LR  10/31/23, #180, 3 rf LOV  11/24/24 FOV  02/23/25  Please review and advise.  Thanks.  Dm/cma

## 2024-12-25 ENCOUNTER — Ambulatory Visit (INDEPENDENT_AMBULATORY_CARE_PROVIDER_SITE_OTHER): Admitting: Physician Assistant

## 2024-12-25 ENCOUNTER — Telehealth (INDEPENDENT_AMBULATORY_CARE_PROVIDER_SITE_OTHER): Payer: Self-pay

## 2024-12-25 DIAGNOSIS — H6993 Unspecified Eustachian tube disorder, bilateral: Secondary | ICD-10-CM | POA: Diagnosis not present

## 2024-12-25 DIAGNOSIS — H902 Conductive hearing loss, unspecified: Secondary | ICD-10-CM | POA: Diagnosis not present

## 2024-12-25 NOTE — Telephone Encounter (Signed)
 Spoke to patient regarding her appointment today. I explained that Samantha [name] would call her after lunch due to an event that he had to come up at the hospital. Patient understood.

## 2024-12-25 NOTE — Progress Notes (Signed)
 Dear Dr. Thedora, Here is my assessment for our mutual patient, Samantha [name]. Thank you for allowing me the opportunity to care for your patient. Please do not hesitate to contact me should you have any other questions. Sincerely, Chyrl Cohen PA-C  Otolaryngology Clinic Note Referring provider: Dr. Thedora HPI:  Samantha [name] is a 69 y.o. female kindly referred by Dr. Thedora    Visit conducted via real-time audio/video telehealth platform. Patient location: [place] . Provider location: [place] . Patient identity verified. Consent for telehealth obtained. History and exam performed within limitations of telehealth. Total time spent: 5 mintues  69 year old female with history of eustachian tube dysfunction status post tympanostomy tubes previously seen in our office on 12/01/2024 with ongoing ear related complaints.  At that time she did not have retraction no visible fluid collection.  Audiological evaluation was completed at [facility] which showed right sided type B, left-sided type C consistent with eustachian tube dysfunction with bilateral conductive hearing loss.  Today the patient notes that she has not had any significant changes, some clicking and popping out of the right ear today.  This feels similar to previous.  She has been attempting using nasal steroids, saline and antihistamines with no significant benefit, this is the same as previous attempts.  Independent Review of Additional Tests or Records:  Audiological evaluation          PMH/Meds/All/SocHx/FamHx/ROS:   Past Medical History:  Diagnosis Date   Arthritis    Dyslipidemia    Heart disease    Heart murmur 2011   Left bundle branch block 2011   Type II or unspecified type diabetes mellitus without mention of complication, not stated as uncontrolled    Unspecified essential hypertension      Past Surgical History:  Procedure Laterality Date   CATARACT EXTRACTION W/PHACO Left 07/11/2022    Procedure: CATARACT EXTRACTION PHACO AND INTRAOCULAR LENS PLACEMENT (IOC) LEFT  DIABETIC;  Surgeon: Jaye Fallow, MD;  Location: [facility];  Service: Ophthalmology;  Laterality: Left;  3.60 0:22.7   CATARACT EXTRACTION W/PHACO Right 07/25/2022   Procedure: CATARACT EXTRACTION PHACO AND INTRAOCULAR LENS PLACEMENT (IOC) RIGHT DIABETIC 5.12 00:33.0;  Surgeon: Jaye Fallow, MD;  Location: [facility];  Service: Ophthalmology;  Laterality: Right;   KNEE ARTHROSCOPY Right 2017   LEFT HEART CATH AND CORONARY ANGIOGRAPHY N/A 06/30/2021   Procedure: LEFT HEART CATH AND CORONARY ANGIOGRAPHY;  Surgeon: Burnard Debby LABOR, MD;  Location: [facility];  Service: Cardiovascular;  Laterality: N/A;   MIDDLE EAR SURGERY Right    TONSILLECTOMY AND ADENOIDECTOMY  1962    Family History  Problem Relation Age of Onset   Diabetes Mother    Hypertension Mother    CAD Mother    Hypertension Father    Diabetes Father    CAD Father    Diabetes Sister    Hypertension Sister    Diabetes Brother    Hyperlipidemia Brother    Hypertension Brother    CAD Brother    Cancer Paternal Uncle        Brain   Cancer Paternal Grandmother        Colon   Kidney disease Paternal Grandfather      Social Connections: Moderately Integrated (03/21/2024)   Social Connection and Isolation Panel    Frequency of Communication with Friends and Family: More than three times a week    Frequency of Social Gatherings with Friends and Family: Three times a week    Attends Religious Services:  1 to 4 times per year    Active Member of Clubs or Organizations: No    Attends Banker Meetings: Never    Marital Status: Married     Current Medications[1]   Physical Exam:   There were no vitals taken for this visit.  Pertinent Findings  Telephone visit  History and exam performed within limitations of telehealth. Patient speaking in full sentences in no acute distress    Seprately  Identifiable Procedures:  None  Impression & Plans:  Samantha [name] is a 69 y.o. female with the following   -Eustachian tube dysfunction-  Chronic eustachian tube dysfunction.  This is evidenced by right type B tympanometry, left-sided type C.  She also has conductive hearing loss.  She did have benefit from previous tympanostomy tubes.  I do think she is a candidate for this today.  I will discuss it with one of her surgeons and place her on their schedule for an office tympanostomy tube placement.  She will reach out in the meantime she develops any new or worsening signs or symptoms.   - f/u in office for tympanostomy tubes    Thank you for allowing me the opportunity to care for your patient. Please do not hesitate to contact me should you have any other questions.  Sincerely, Chyrl Cohen PA-C [facility] Phone: [phone] Fax: [phone]  12/25/2024, 12:39 PM      [1]  Current Outpatient Medications:    albuterol  (VENTOLIN  HFA) 108 (90 Base) MCG/ACT inhaler, Inhale 1-2 puffs into the lungs every 6 (six) hours as needed., Disp: 8 g, Rfl: 0   Calcium Citrate-Vitamin D  (CALCIUM + D PO), Take 1 tablet by mouth every morning. , Disp: , Rfl:    cetirizine (ZYRTEC) 10 MG tablet, Take 10 mg by mouth daily., Disp: , Rfl:    diclofenac  (VOLTAREN ) 75 MG EC tablet, Take 1 tablet (75 mg total) by mouth 2 (two) times daily as needed., Disp: 60 tablet, Rfl: 3   diclofenac  Sodium (VOLTAREN ) 1 % GEL, Apply 2 g topically 4 (four) times daily., Disp: 100 g, Rfl: 3   FLUoxetine  (PROZAC ) 40 MG capsule, Take 1 capsule (40 mg total) by mouth daily., Disp: 90 capsule, Rfl: 3   fluticasone  (FLONASE ) 50 MCG/ACT nasal spray, Place 2 sprays into both nostrils daily., Disp: 16 g, Rfl: 6   fluticasone  (FLONASE ) 50 MCG/ACT nasal spray, Place 2 sprays into both nostrils daily., Disp: 16 g, Rfl: 6   gabapentin  (NEURONTIN ) 300 MG capsule, TAKE 1 CAPSULE BY MOUTH IN THE MORNING AND 2 IN  THE EVENING, Disp: 270 capsule, Rfl: 3   hydrochlorothiazide  (MICROZIDE ) 12.5 MG capsule, Take 1 capsule (12.5 mg total) by mouth daily., Disp: 90 capsule, Rfl: 3   Insulin  Degludec FlexTouch 100 UNIT/ML SOPN, Inject 14 Units into the skin at bedtime., Disp: 420 mL, Rfl: 3   lovastatin  (MEVACOR ) 20 MG tablet, Take 1 tablet (20 mg total) by mouth daily., Disp: 90 tablet, Rfl: 3   metFORMIN  (GLUCOPHAGE ) 1000 MG tablet, TAKE 1 TABLET BY MOUTH TWICE DAILY WITH A MEAL, Disp: 180 tablet, Rfl: 3   nitroGLYCERIN  (NITROSTAT ) 0.4 MG SL tablet, Place 1 tablet (0.4 mg total) under the tongue every 5 (five) minutes as needed for chest pain., Disp: 30 tablet, Rfl: 12   ofloxacin  (FLOXIN ) 0.3 % OTIC solution, Place 5 drops into the right ear daily., Disp: 5 mL, Rfl: 0   pantoprazole  (PROTONIX ) 40 MG tablet, Take 1 tablet by  mouth once daily, Disp: 90 tablet, Rfl: 0   predniSONE  (DELTASONE ) 5 MG tablet, 6-6-5-5-4-4-3-3-2-2-1-1-OFF, Disp: 42 tablet, Rfl: 0   sucralfate  (CARAFATE ) 1 g tablet, Take 1 tablet (1 g total) by mouth 3 (three) times daily., Disp: 90 tablet, Rfl: 0   SUMAtriptan (IMITREX) 50 MG tablet, Take 50 mg by mouth., Disp: , Rfl:    valsartan  (DIOVAN ) 160 MG tablet, Take 1 tablet (160 mg total) by mouth daily., Disp: 90 tablet, Rfl: 3   verapamil  (CALAN -SR) 240 MG CR tablet, Take 1 tablet (240 mg total) by mouth daily., Disp: 90 tablet, Rfl: 3

## 2025-02-23 ENCOUNTER — Ambulatory Visit: Admitting: Family Medicine

## 2025-03-27 ENCOUNTER — Ambulatory Visit
